# Patient Record
Sex: Male | Born: 1935 | Race: White | Hispanic: No | Marital: Married | Smoking: Former smoker
Health system: Southern US, Community
[De-identification: ages and names within clinical notes are randomized; demographics above are authoritative.]

## PROBLEM LIST (undated history)

## (undated) DIAGNOSIS — F419 Anxiety disorder, unspecified: Secondary | ICD-10-CM

## (undated) DIAGNOSIS — I714 Abdominal aortic aneurysm, without rupture, unspecified: Secondary | ICD-10-CM

## (undated) DIAGNOSIS — I639 Cerebral infarction, unspecified: Secondary | ICD-10-CM

## (undated) DIAGNOSIS — I2510 Atherosclerotic heart disease of native coronary artery without angina pectoris: Secondary | ICD-10-CM

## (undated) DIAGNOSIS — I5020 Unspecified systolic (congestive) heart failure: Secondary | ICD-10-CM

## (undated) DIAGNOSIS — E039 Hypothyroidism, unspecified: Secondary | ICD-10-CM

## (undated) DIAGNOSIS — K219 Gastro-esophageal reflux disease without esophagitis: Secondary | ICD-10-CM

## (undated) DIAGNOSIS — J189 Pneumonia, unspecified organism: Secondary | ICD-10-CM

## (undated) DIAGNOSIS — Z86718 Personal history of other venous thrombosis and embolism: Secondary | ICD-10-CM

## (undated) DIAGNOSIS — M1990 Unspecified osteoarthritis, unspecified site: Secondary | ICD-10-CM

## (undated) DIAGNOSIS — I219 Acute myocardial infarction, unspecified: Secondary | ICD-10-CM

## (undated) DIAGNOSIS — I6529 Occlusion and stenosis of unspecified carotid artery: Secondary | ICD-10-CM

## (undated) DIAGNOSIS — Z95810 Presence of automatic (implantable) cardiac defibrillator: Secondary | ICD-10-CM

## (undated) DIAGNOSIS — I4891 Unspecified atrial fibrillation: Secondary | ICD-10-CM

## (undated) DIAGNOSIS — E785 Hyperlipidemia, unspecified: Secondary | ICD-10-CM

## (undated) DIAGNOSIS — I509 Heart failure, unspecified: Secondary | ICD-10-CM

## (undated) DIAGNOSIS — K635 Polyp of colon: Secondary | ICD-10-CM

## (undated) DIAGNOSIS — I739 Peripheral vascular disease, unspecified: Secondary | ICD-10-CM

## (undated) DIAGNOSIS — I495 Sick sinus syndrome: Secondary | ICD-10-CM

## (undated) DIAGNOSIS — I255 Ischemic cardiomyopathy: Secondary | ICD-10-CM

## (undated) DIAGNOSIS — I10 Essential (primary) hypertension: Secondary | ICD-10-CM

## (undated) HISTORY — DX: Abdominal aortic aneurysm, without rupture, unspecified: I71.40

## (undated) HISTORY — DX: Personal history of other venous thrombosis and embolism: Z86.718

## (undated) HISTORY — DX: Heart failure, unspecified: I50.9

## (undated) HISTORY — PX: INGUINAL HERNIA REPAIR: SUR1180

## (undated) HISTORY — PX: ESOPHAGOGASTRODUODENOSCOPY: SHX1529

## (undated) HISTORY — DX: Pneumonia, unspecified organism: J18.9

## (undated) HISTORY — DX: Polyp of colon: K63.5

## (undated) HISTORY — DX: Occlusion and stenosis of unspecified carotid artery: I65.29

## (undated) HISTORY — DX: Peripheral vascular disease, unspecified: I73.9

## (undated) HISTORY — DX: Atherosclerotic heart disease of native coronary artery without angina pectoris: I25.10

## (undated) HISTORY — DX: Unspecified atrial fibrillation: I48.91

## (undated) HISTORY — DX: Anxiety disorder, unspecified: F41.9

## (undated) HISTORY — DX: Hyperlipidemia, unspecified: E78.5

## (undated) HISTORY — DX: Acute myocardial infarction, unspecified: I21.9

## (undated) HISTORY — PX: RENAL ARTERY STENT: SHX2321

## (undated) HISTORY — DX: Sick sinus syndrome: I49.5

## (undated) HISTORY — DX: Abdominal aortic aneurysm, without rupture: I71.4

## (undated) HISTORY — PX: COLONOSCOPY: SHX174

## (undated) HISTORY — PX: CATARACT EXTRACTION W/ INTRAOCULAR LENS  IMPLANT, BILATERAL: SHX1307

## (undated) HISTORY — PX: TRANSLUMINAL ANGIOPLASTY: SHX274

## (undated) HISTORY — PX: OTHER SURGICAL HISTORY: SHX169

---

## 1993-05-17 HISTORY — PX: ABDOMINAL AORTIC ANEURYSM REPAIR: SUR1152

## 1993-05-17 HISTORY — PX: CORONARY ARTERY BYPASS GRAFT: SHX141

## 1999-04-28 ENCOUNTER — Encounter: Payer: Self-pay | Admitting: Internal Medicine

## 1999-04-28 ENCOUNTER — Encounter: Admission: RE | Admit: 1999-04-28 | Discharge: 1999-04-28 | Payer: Self-pay | Admitting: Internal Medicine

## 2002-03-25 ENCOUNTER — Ambulatory Visit (HOSPITAL_COMMUNITY): Admission: RE | Admit: 2002-03-25 | Discharge: 2002-03-25 | Payer: Self-pay | Admitting: Cardiology

## 2002-05-04 ENCOUNTER — Ambulatory Visit (HOSPITAL_COMMUNITY): Admission: RE | Admit: 2002-05-04 | Discharge: 2002-05-04 | Payer: Self-pay | Admitting: Cardiology

## 2003-06-22 ENCOUNTER — Ambulatory Visit (HOSPITAL_COMMUNITY): Admission: RE | Admit: 2003-06-22 | Discharge: 2003-06-22 | Payer: Self-pay | Admitting: *Deleted

## 2003-06-22 ENCOUNTER — Encounter (INDEPENDENT_AMBULATORY_CARE_PROVIDER_SITE_OTHER): Payer: Self-pay | Admitting: Specialist

## 2004-09-28 ENCOUNTER — Observation Stay (HOSPITAL_COMMUNITY): Admission: RE | Admit: 2004-09-28 | Discharge: 2004-09-28 | Payer: Self-pay | Admitting: Surgery

## 2005-10-12 ENCOUNTER — Encounter (INDEPENDENT_AMBULATORY_CARE_PROVIDER_SITE_OTHER): Payer: Self-pay | Admitting: *Deleted

## 2005-10-12 ENCOUNTER — Ambulatory Visit (HOSPITAL_COMMUNITY): Admission: RE | Admit: 2005-10-12 | Discharge: 2005-10-12 | Payer: Self-pay | Admitting: *Deleted

## 2007-02-05 ENCOUNTER — Inpatient Hospital Stay (HOSPITAL_COMMUNITY): Admission: EM | Admit: 2007-02-05 | Discharge: 2007-02-09 | Payer: Self-pay | Admitting: Emergency Medicine

## 2007-03-04 ENCOUNTER — Encounter: Payer: Self-pay | Admitting: Internal Medicine

## 2008-01-27 ENCOUNTER — Ambulatory Visit (HOSPITAL_COMMUNITY): Admission: RE | Admit: 2008-01-27 | Discharge: 2008-01-27 | Payer: Self-pay | Admitting: *Deleted

## 2008-05-11 ENCOUNTER — Encounter: Admission: RE | Admit: 2008-05-11 | Discharge: 2008-05-11 | Payer: Self-pay | Admitting: Internal Medicine

## 2008-11-17 ENCOUNTER — Encounter: Payer: Self-pay | Admitting: Internal Medicine

## 2009-01-04 ENCOUNTER — Encounter: Payer: Self-pay | Admitting: Internal Medicine

## 2009-06-28 ENCOUNTER — Encounter: Payer: Self-pay | Admitting: Internal Medicine

## 2009-06-30 ENCOUNTER — Ambulatory Visit: Payer: Self-pay | Admitting: Internal Medicine

## 2009-06-30 DIAGNOSIS — E785 Hyperlipidemia, unspecified: Secondary | ICD-10-CM | POA: Insufficient documentation

## 2009-06-30 DIAGNOSIS — I4891 Unspecified atrial fibrillation: Secondary | ICD-10-CM | POA: Insufficient documentation

## 2009-06-30 DIAGNOSIS — I495 Sick sinus syndrome: Secondary | ICD-10-CM | POA: Insufficient documentation

## 2009-06-30 DIAGNOSIS — I2510 Atherosclerotic heart disease of native coronary artery without angina pectoris: Secondary | ICD-10-CM | POA: Insufficient documentation

## 2009-06-30 DIAGNOSIS — I10 Essential (primary) hypertension: Secondary | ICD-10-CM | POA: Insufficient documentation

## 2009-07-07 ENCOUNTER — Ambulatory Visit (HOSPITAL_COMMUNITY): Admission: RE | Admit: 2009-07-07 | Discharge: 2009-07-07 | Payer: Self-pay | Admitting: Internal Medicine

## 2009-07-07 ENCOUNTER — Encounter: Payer: Self-pay | Admitting: Internal Medicine

## 2009-07-07 ENCOUNTER — Ambulatory Visit: Payer: Self-pay

## 2009-07-07 ENCOUNTER — Ambulatory Visit: Payer: Self-pay | Admitting: Internal Medicine

## 2009-07-13 ENCOUNTER — Telehealth: Payer: Self-pay | Admitting: Internal Medicine

## 2009-07-14 ENCOUNTER — Encounter: Payer: Self-pay | Admitting: Internal Medicine

## 2009-07-20 ENCOUNTER — Telehealth: Payer: Self-pay | Admitting: Internal Medicine

## 2009-07-21 ENCOUNTER — Encounter: Payer: Self-pay | Admitting: Internal Medicine

## 2009-07-21 ENCOUNTER — Telehealth: Payer: Self-pay | Admitting: Internal Medicine

## 2009-07-25 ENCOUNTER — Encounter: Payer: Self-pay | Admitting: Internal Medicine

## 2009-07-26 ENCOUNTER — Encounter: Payer: Self-pay | Admitting: Internal Medicine

## 2009-07-28 ENCOUNTER — Telehealth: Payer: Self-pay | Admitting: Internal Medicine

## 2009-07-29 ENCOUNTER — Encounter: Payer: Self-pay | Admitting: Internal Medicine

## 2009-08-01 ENCOUNTER — Ambulatory Visit: Payer: Self-pay | Admitting: Internal Medicine

## 2009-08-01 ENCOUNTER — Inpatient Hospital Stay (HOSPITAL_COMMUNITY): Admission: RE | Admit: 2009-08-01 | Discharge: 2009-08-02 | Payer: Self-pay | Admitting: Internal Medicine

## 2009-08-01 HISTORY — PX: CARDIAC PACEMAKER PLACEMENT: SHX583

## 2009-08-13 ENCOUNTER — Ambulatory Visit: Payer: Self-pay | Admitting: Diagnostic Radiology

## 2009-08-13 ENCOUNTER — Emergency Department (HOSPITAL_BASED_OUTPATIENT_CLINIC_OR_DEPARTMENT_OTHER): Admission: EM | Admit: 2009-08-13 | Discharge: 2009-08-13 | Payer: Self-pay | Admitting: Emergency Medicine

## 2009-08-18 ENCOUNTER — Encounter: Payer: Self-pay | Admitting: Internal Medicine

## 2009-08-18 ENCOUNTER — Ambulatory Visit: Payer: Self-pay

## 2009-08-19 ENCOUNTER — Ambulatory Visit (HOSPITAL_COMMUNITY): Admission: RE | Admit: 2009-08-19 | Discharge: 2009-08-19 | Payer: Self-pay | Admitting: Internal Medicine

## 2009-08-19 ENCOUNTER — Encounter: Payer: Self-pay | Admitting: Internal Medicine

## 2009-08-19 ENCOUNTER — Ambulatory Visit: Payer: Self-pay | Admitting: Thoracic Surgery

## 2009-09-15 ENCOUNTER — Telehealth (INDEPENDENT_AMBULATORY_CARE_PROVIDER_SITE_OTHER): Payer: Self-pay | Admitting: *Deleted

## 2009-11-22 ENCOUNTER — Ambulatory Visit: Payer: Self-pay | Admitting: Internal Medicine

## 2009-11-22 DIAGNOSIS — Z95810 Presence of automatic (implantable) cardiac defibrillator: Secondary | ICD-10-CM | POA: Insufficient documentation

## 2009-12-13 ENCOUNTER — Encounter: Admission: RE | Admit: 2009-12-13 | Discharge: 2009-12-13 | Payer: Self-pay | Admitting: Thoracic Surgery

## 2009-12-13 ENCOUNTER — Ambulatory Visit: Payer: Self-pay | Admitting: Thoracic Surgery

## 2009-12-14 ENCOUNTER — Telehealth: Payer: Self-pay | Admitting: Internal Medicine

## 2009-12-16 ENCOUNTER — Encounter: Payer: Self-pay | Admitting: Cardiovascular Disease

## 2009-12-16 DIAGNOSIS — I6529 Occlusion and stenosis of unspecified carotid artery: Secondary | ICD-10-CM | POA: Insufficient documentation

## 2009-12-19 ENCOUNTER — Encounter: Payer: Self-pay | Admitting: Cardiovascular Disease

## 2009-12-19 ENCOUNTER — Ambulatory Visit: Payer: Self-pay | Admitting: Internal Medicine

## 2009-12-19 ENCOUNTER — Ambulatory Visit: Payer: Self-pay

## 2009-12-21 ENCOUNTER — Encounter: Payer: Self-pay | Admitting: Cardiovascular Disease

## 2010-02-21 ENCOUNTER — Ambulatory Visit: Payer: Self-pay | Admitting: Internal Medicine

## 2010-04-08 ENCOUNTER — Encounter: Payer: Self-pay | Admitting: Cardiology

## 2010-04-20 NOTE — Letter (Signed)
Summary: [facility]   Imported By: Marylou Mccoy 12/19/2009 15:09:45  _____________________________________________________________________  External Attachment:    Type:   Image     Comment:   External Document

## 2010-04-20 NOTE — Progress Notes (Signed)
Summary: echo results  Phone Note Call from Patient Call back at Home Phone [phone]   Caller: Spouse Reason for Call: Lab or Test Results Summary of Call: request echo results Initial call taken by: Migdalia Dk,  July 13, 2009 3:23 PM  Follow-up for Phone Call        pt aware Dennis Bast, RN, BSN  July 13, 2009 3:54 PM

## 2010-04-20 NOTE — Progress Notes (Signed)
Summary: procedure date  Phone Note Call from Patient Call back at Home Phone [phone]   Caller: Patient Reason for Call: Talk to Nurse Summary of Call: pt calling back about procedure date.... pt request 5/16 Initial call taken by: Migdalia Dk,  Jul 21, 2009 11:49 AM  Follow-up for Phone Call        spoke with wife procedure will be on the 16th  and the labs on Mon5/9/11 at Dr Elmyra Ricks office [facility].  pt's wife aware Dennis Bast, RN, BSN  Jul 21, 2009 12:14 PM

## 2010-04-20 NOTE — Progress Notes (Signed)
  Records Recieved from [facility] gave to [facility]  September 15, 2009 2:16 PM

## 2010-04-20 NOTE — Cardiovascular Report (Signed)
Summary: Office Visit   Office Visit   Imported By: Roderic Ovens 09/15/2009 12:44:47  _____________________________________________________________________  External Attachment:    Type:   Image     Comment:   External Document

## 2010-04-20 NOTE — Letter (Signed)
Summary: [facility]   Imported By: Marylou Mccoy 12/19/2009 15:09:09  _____________________________________________________________________  External Attachment:    Type:   Image     Comment:   External Document

## 2010-04-20 NOTE — Procedures (Signed)
Summary: [facility] g[facility]   Current Medications (verified): 1)  Lipitor 20 Mg Tabs (Atorvastatin Calcium) .... Take One Tablet By Mouth Daily. 2)  Calcium 600 1500 Mg Tabs (Calcium Carbonate) .... Once Daily 3)  Benicar Hct 40-12.5 Mg Tabs (Olmesartan Medoxomil-Hctz) .... Take One Tablet By Mouth Once Daily. 4)  Synthroid 125 Mcg Tabs (Levothyroxine Sodium) .... Take One Tablet By Mouth Once Daily. 5)  Super B Complex  Tabs (B Complex-C) .... Once Daily 6)  Fish Oil   Oil (Fish Oil) .... Two Times A Day 7)  Glucosamine Sulfate   Powd (Glucosamine Sulfate) .Marland Kitchen.. 1-2 Daily 8)  Omeprazole 20 Mg Cpdr (Omeprazole) .... As Needed 9)  Vitamin C 500 Mg  Tabs (Ascorbic Acid) .... Once Daily 10)  Saw Palmetto   Powd (Saw [place]) .... Once Daily 11)  Multivitamins   Tabs (Multiple Vitamin) .... Qd 12)  Vitamin D 1000 Unit  Tabs (Cholecalciferol) .... Once Daily 13)  Gnp Cinnamon .... 2 Daily 14)  Magnesium Oxide 500 Mg Tabs (Magnesium Oxide) .... Once Daily 15)  Jantoven 4 Mg Tabs (Warfarin Sodium) .... Uad 16)  Diltiazem Hcl Cr 120 Mg Xr12h-Cap (Diltiazem Hcl) .... One By Mouth Daily  Allergies (verified): 1)  ! Morphine 2)  ! Codeine  PPM Specifications Following MD:  Lewayne Bunting, MD     PPM Vendor:  St Jude     PPM Model Number:  [id number]     PPM Serial Number:  [id number] PPM DOI:  08/01/2009     PPM Implanting MD:  Lewayne Bunting, MD  Lead 1    Location: RA     DOI: 08/01/2009     Model #: [id number]     Serial #: [id number]     Status: active Lead 2    Location: RV     DOI: 08/01/2009     Model #: [id number]     Serial #: [id number]     Status: active  Magnet Response Rate:  BOL 100 ERI  85  Indications:  Sick sinus syndrome   PPM Follow Up Remote Check?  No Battery Voltage:  3.11 V     Battery Est. Longevity:  6.4 years     Pacer Dependent:  No       PPM Device Measurements Atrium  Amplitude: 2.2 mV, Impedance: 530 ohms,  Right Ventricle  Amplitude: 12 mV, Impedance: 530 ohms, Threshold:  0.75 V at 0.4 msec  Episodes MS Episodes:  93     Percent Mode Switch:  41%     Coumadin:  Yes Ventricular High Rate:  5     Atrial Pacing:  37%     Ventricular Pacing:  3.9%  Parameters Mode:  DDDR     Lower Rate Limit:  60     Upper Rate Limit:  105 Paced AV Delay:  250     Sensed AV Delay:  250 Tech Comments:  Steri strips removed.  No redness or edema. A-fib with RVR, + coumadin.  135/61.  Cardizem decreased by PCP 120mg  once daily.  Mr. Pettengill c/o fatigue and SOB.  I will discuss this with Dr. Ladona Ridgel.  He is scheduled for follow up in September. Altha Harm, LPN  August 18, 5364 3:49 PM   Prescriptions: DIGOXIN 0.125 MG TABS (DIGOXIN) take 1 tablet qd  #90 x 3   Entered by:   Ledon Snare, RN   Authorized by:   Laren Boom, MD, [facility]   Signed by:  Ledon Snare, RN on 08/19/2009   Method used:   Electronically to        [facility] (retail)       [phone]       [place], [place]  [id number]       Ph: [phone]       Fax: [phone]   RxID:   [id number]

## 2010-04-20 NOTE — Miscellaneous (Signed)
Summary: Orders Update  Clinical Lists Changes  Problems: Added new problem of CAROTID ARTERY DISEASE (ICD-433.10) - Per IDX comments, Dr. Pearson Grippe Orders: Added new Test order of Carotid Duplex (Carotid Duplex) - Signed

## 2010-04-20 NOTE — Letter (Signed)
Summary: Implantable Device Instructions  Architectural technologist, Main Office  [address]   [place], [place] [address]   Phone: [phone]  Fax: [phone]      Implantable Device Instructions  You are scheduled for:  _____ Permanent Transvenous Pacemaker   on 08/01/09 with Dr. Ladona Ridgel.  1.  Please arrive at the Short Stay Center at [facility] at 5:30a.m. on the day of your procedure.  2.  Do not eat or drink after midnight the night before your procedure.  3.  Complete lab work on 5-9/11 at Dr Elmyra Ricks office.  You do not have to be fasting.  4.   Take your last dose of Coumadin on --will call you if you need to hold your Coumadin.  5.  Plan for an overnight stay.  Bring your insurance cards and a list of your medications.  6.  Wash your chest and neck with antibacterial soap (any brand) the evening before and the morning of your procedure.  Rinse well.   *If you have ANY questions after you get home, please call the office [phone].  Anselm Pancoast  *Every attempt is made to prevent procedures from being rescheduled.  Due to the nauture of Electrophysiology, rescheduling can happen.  The physician is always aware and directs the staff when this occurs.

## 2010-04-20 NOTE — Medication Information (Signed)
Summary: Physician's Orders   Physician's Orders   Imported By: Roderic Ovens 09/16/2009 10:19:49  _____________________________________________________________________  External Attachment:    Type:   Image     Comment:   External Document

## 2010-04-20 NOTE — Progress Notes (Signed)
Summary: medication making him dizzy  Phone Note Call from Patient Call back at Home Phone [phone] Call back at [phone]   Caller: Patient Reason for Call: Talk to Nurse, Talk to Doctor Summary of Call: pt  can't take amiodarone 200mg  because it makes him dizzy so he needs to talk to someone cause he feels like he will pass out Initial call taken by: Omer Jack,  December 14, 2009 12:40 PM  Follow-up for Phone Call        2 weeks ago decreased the Amiodarone to 1/2 tablet daily and as of Monday stopped Amiodarone all together.  Will go back on Diitiazem 120mg  daily.  Dr Ladona Ridgel aware and agrees with the above plan.  Pt coming in on Mon will have his device interrogated then Dennis Bast, RN, BSN  December 14, 2009 2:04 PM    New/Updated Medications: DILTIAZEM HCL ER BEADS 120 MG XR24H-CAP (DILTIAZEM HCL ER BEADS) Take one capsule by mouth daily

## 2010-04-20 NOTE — Cardiovascular Report (Signed)
Summary: Implantable Device Registration Form  Implantable Device Registration Form   Imported By: Roderic Ovens 09/15/2009 12:45:41  _____________________________________________________________________  External Attachment:    Type:   Image     Comment:   External Document

## 2010-04-20 NOTE — Assessment & Plan Note (Signed)
Summary: device/saf   Visit Type:  Follow-up Primary Larell Baney:  Pearson Grippe, MD   History of Present Illness: Mr. Milke returns today for followup.  He is a pleasant 77 yo man with CAD and preserved LV function who has developed symptomatic bradycardia and PAF.  He underwent PPM several months ago.  He has not had syncope  but admits to symptomatic palpitations.  Current Medications (verified): 1)  Lipitor 20 Mg Tabs (Atorvastatin Calcium) .... Take One Tablet By Mouth Daily. 2)  Calcium 600 1500 Mg Tabs (Calcium Carbonate) .... 3 Times Per Week 3)  Benicar Hct 40-12.5 Mg Tabs (Olmesartan Medoxomil-Hctz) .... Take One Tablet By Mouth Once Daily. 4)  Synthroid 100 Mcg Tabs (Levothyroxine Sodium) .... Once Daily 5)  Super B Complex  Tabs (B Complex-C) .... Once Daily 6)  Fish Oil   Oil (Fish Oil) .... Once Daily 7)  Omeprazole 20 Mg Cpdr (Omeprazole) .... As Needed 8)  Vitamin C 500 Mg  Tabs (Ascorbic Acid) .... Once Daily 9)  Saw Palmetto   Powd (Saw [place]) .... Two Times A Day 10)  Multivitamins   Tabs (Multiple Vitamin) .... Qd 11)  Vitamin D 1000 Unit  Tabs (Cholecalciferol) .... Once Daily 12)  Gnp Cinnamon .... 2 Daily 13)  Magnesium Oxide 500 Mg Tabs (Magnesium Oxide) .... Once Daily 14)  Jantoven 4 Mg Tabs (Warfarin Sodium) .... Uad 15)  Diltiazem Hcl Er Beads 120 Mg Xr24h-Cap (Diltiazem Hcl Er Beads) .... Take One Capsule By Mouth Daily  Allergies: 1)  ! Morphine 2)  ! Codeine  Past History:  Past Medical History: Last updated: 06/30/2009 Current Problems:  CAD (ICD-414.00) OTHER AND UNSPECIFIED HYPERLIPIDEMIA (ICD-272.4) HYPERTENSION (ICD-401.9) ATRIAL FIBRILLATION, PAROXYSMAL (ICD-427.31) BRADYCARDIA-TACHYCARDIA SYNDROME (ICD-427.81)    Past Surgical History: Last updated: 06/30/2009  DATE OF PROCEDURE:  09/28/2004  SURGEON:  Velora Heckler, M.D.  PROCEDURE:  Repair left inguinal hernia with polypropylene mesh.  Review of Systems  The patient denies chest  pain, syncope, dyspnea on exertion, and peripheral edema.    Vital Signs:  Patient profile:   77 year old male Height:      73 inches Weight:      210 pounds Pulse rate:   60 / minute Pulse rhythm:   regular BP sitting:   110 / 60  (left arm)  Vitals Entered By: Laurance Flatten CMA (February 21, 2010 10:49 AM)  Physical Exam  General:  Elderly, well developed, well nourished, in no acute distress.  HEENT: normal Neck: supple. No JVD. Carotids 2+ bilaterally no bruits Cor: RRR no rubs, gallops or murmur Lungs: CTA.Decreased breath sounds throughout. Ab: soft, nontender. nondistended. No HSM. Good bowel sounds. Well healed midline scar. Ext: warm. no cyanosis, clubbing or edema Neuro: alert and oriented. Grossly nonfocal. affect pleasant    PPM Specifications Following MD:  Lewayne Bunting, MD     PPM Vendor:  St Jude     PPM Model Number:  [id number]     PPM Serial Number:  [id number] PPM DOI:  08/01/2009     PPM Implanting MD:  Lewayne Bunting, MD  Lead 1    Location: RA     DOI: 08/01/2009     Model #: [id number]     Serial #: [id number]     Status: active Lead 2    Location: RV     DOI: 08/01/2009     Model #: [id number]     Serial #: [id number]     Status: active  Magnet Response Rate:  BOL 100 ERI  85  Indications:  Sick sinus syndrome   PPM Follow Up Battery Voltage:  2.96 V     Battery Est. Longevity:  6.5-6.8 YRS     Pacer Dependent:  No       PPM Device Measurements Atrium  Amplitude: 3.3 mV, Impedance: 410 ohms, Threshold: 1.75 V at 0.4 msec Right Ventricle  Amplitude: 12.0 mV, Impedance: 530 ohms,   Episodes MS Episodes:  3085     Percent Mode Switch:  <1%     Coumadin:  Yes Ventricular High Rate:  0     Atrial Pacing:  64%     Ventricular Pacing:  16%  Parameters Mode:  DDDR     Lower Rate Limit:  60     Upper Rate Limit:  105 Paced AV Delay:  250     Sensed AV Delay:  250 Next Cardiology Appt Due:  07/18/2010 Tech Comments:  3085 AMS EPISODES--LONGEST WAS 11 HRS 15 MINUTES. +  COUMADIN.  NORMAL DEVICE FUNCTION.  NO CHANGES MADE. ROV IN MAY 2012 W/GT. Vella Kohler  February 21, 2010 11:04 AM MD Comments:  Agree with above.  Impression & Recommendations:  Problem # 1:  PACEMAKER, PERMANENT (ICD-V45.01) His device is working normally.  Will recheck in several months.  Problem # 2:  CAD (ICD-414.00) He denies anginal symptoms.  Continue meds as below. His updated medication list for this problem includes:    Jantoven 4 Mg Tabs (Warfarin sodium) ..... Uad    Diltiazem Hcl Er Beads 120 Mg Xr24h-cap (Diltiazem hcl er beads) .Marland Kitchen... Take one capsule by mouth daily    Nitrostat 0.4 Mg Subl (Nitroglycerin) .Marland Kitchen... Take as directed  Problem # 3:  ATRIAL FIBRILLATION, PAROXYSMAL (ICD-427.31) His symptoms are well controlled. Continue meds as below. The following medications were removed from the medication list:    Amiodarone Hcl 200 Mg Tabs (Amiodarone hcl) .Marland Kitchen... Take one tablet by mouth daily His updated medication list for this problem includes:    Jantoven 4 Mg Tabs (Warfarin sodium) ..... Uad  Patient Instructions: 1)  Your physician wants you to follow-up in: May with Dr Court Joy will receive a reminder letter in the mail two months in advance. If you don't receive a letter, please call our office to schedule the follow-up appointment. 2)  Your physician recommended you take 1 tablet (or 1 spray) under tongue at onset of chest pain; you may repeat every 5 minutes for up to 3 doses. If 3 or more doses are required, call 911 and proceed to the ER immediately. Prescriptions: NITROSTAT 0.4 MG SUBL (NITROGLYCERIN) take as directed  #25 x 11   Entered by:   Dennis Bast, RN, BSN   Authorized by:   Laren Boom, MD, [facility]   Signed by:   Dennis Bast, RN, BSN on 02/21/2010   Method used:   Electronically to        [facility] (retail)       [address]       [place], [place]  [id number]       Ph: [phone]        Fax: [phone]   RxID:   [id number]

## 2010-04-20 NOTE — Progress Notes (Signed)
Summary: question regarding procedure  Phone Note Call from Patient Call back at Home Phone [phone] Call back at [phone]   Caller: Spouse- Trevor Simpson Reason for Call: Talk to Nurse Summary of Call: pt wife calling having question regareding procedure, also did you received the faxed from yesterday.  Initial call taken by: Lorne Skeens,  Jul 28, 2009 2:01 PM  Follow-up for Phone Call        the cell number has no voicemail spoke with wife all of her questions were answered Dennis Bast, RN, BSN  Jul 28, 2009 5:08 PM

## 2010-04-20 NOTE — Progress Notes (Signed)
Summary: device placement  Phone Note Call from Patient Call back at Home Phone [phone]   Caller: Spouse Reason for Call: Talk to Nurse Summary of Call: calling back has not heard anything about deivce placement Initial call taken by: Migdalia Dk,  Jul 20, 2009 2:42 PM  Follow-up for Phone Call        07/20/09--3pm--pt calling about device implant, what day is it sched for?  called the # listed on message but no answer and no answering machine--nt 07/20/09--3:30pm--pt calling back to find out day of procedure--advised-kelly out of office today but will be back  on 5/5--your paperwork is all filled out and kelly will be calling you in next 2 days to sched. the proccedure on day convenient for you--pt agrees--nt Follow-up by: Ledon Snare, RN,  Jul 20, 2009 3:42 PM

## 2010-04-20 NOTE — Cardiovascular Report (Signed)
Summary: Office Visit   Office Visit   Imported By: Roderic Ovens 12/26/2009 11:34:21  _____________________________________________________________________  External Attachment:    Type:   Image     Comment:   External Document

## 2010-04-20 NOTE — Cardiovascular Report (Signed)
Summary: Office Visit   Office Visit   Imported By: Roderic Ovens 03/02/2010 11:57:48  _____________________________________________________________________  External Attachment:    Type:   Image     Comment:   External Document

## 2010-04-20 NOTE — Assessment & Plan Note (Signed)
Summary: NEP/ APPT 1:30/ [facility]/ DISCUSS OF SYNDROME/ AARP GD   Visit Type:  Initial Consult Primary Provider:  Pearson Grippe, MD   History of Present Illness: Trevor Simpson is referred today by Dr. Aleen Campi.  He is a pleasant 77 yo man with CAD and preserved LV function who has developed symptomatic bradycardia.  He was seen in the office of [facility] and found to be bradycardia into the low 40's.  He has never had syncope but does feel fatigued and light headed when his heart goes slowly.  At other times he is asymptomatic.  At still other times, he has palpitations and has had atrial fib with an RVR.  He currently feels well.  No peripheral edema but he does have fairly severe peripheral vascular disease and has had an aortic aneurysm repair in the past.  Current Medications (verified): 1)  Lipitor 20 Mg Tabs (Atorvastatin Calcium) .... Take One Tablet By Mouth Daily. 2)  Calcium 600 1500 Mg Tabs (Calcium Carbonate) .... Once Daily 3)  Benicar Hct 40-12.5 Mg Tabs (Olmesartan Medoxomil-Hctz) .... Take One Tablet By Mouth Once Daily. 4)  Synthroid 137 Mcg Tabs (Levothyroxine Sodium) .... Take One Tablet By Mouth Once Daily. 5)  Diltiazem Hcl Er Beads 240 Mg Xr24h-Cap (Diltiazem Hcl Er Beads) .... Take One Capsule By Mouth Daily 6)  Super B Complex  Tabs (B Complex-C) .... Once Daily 7)  Fish Oil   Oil (Fish Oil) .... Two Times A Day 8)  Glucosamine Sulfate   Powd (Glucosamine Sulfate) .Marland Kitchen.. 1-2 Daily 9)  Omeprazole 20 Mg Cpdr (Omeprazole) .... As Needed 10)  Vitamin C 500 Mg  Tabs (Ascorbic Acid) .... Once Daily 11)  Saw Palmetto   Powd (Saw [place]) .... Once Daily 12)  Multivitamins   Tabs (Multiple Vitamin) .... Qd 13)  Vitamin D 1000 Unit  Tabs (Cholecalciferol) .... Once Daily 14)  Gnp Cinnamon .... 2 Daily 15)  Magnesium Oxide 500 Mg Tabs (Magnesium Oxide) .... Once Daily 16)  Jantoven 4 Mg Tabs (Warfarin Sodium) .... Uad  Allergies (verified): 1)  ! Morphine 2)   ! Codeine  Past History:  Past Medical History: Last updated: 06/30/2009 Current Problems:  CAD (ICD-414.00) OTHER AND UNSPECIFIED HYPERLIPIDEMIA (ICD-272.4) HYPERTENSION (ICD-401.9) ATRIAL FIBRILLATION, PAROXYSMAL (ICD-427.31) BRADYCARDIA-TACHYCARDIA SYNDROME (ICD-427.81)    Past Surgical History: Last updated: 06/30/2009  DATE OF PROCEDURE:  09/28/2004  SURGEON:  Velora Heckler, M.D.  PROCEDURE:  Repair left inguinal hernia with polypropylene mesh.  Family History: Non-contributory  Social History: H/o tobacco abuse, none in years. No ETOH abuse. Married.  Review of Systems       All systems reviewed and negative except as noted in the HPI.  Vital Signs:  Patient profile:   77 year old male Height:      73 inches Weight:      218 pounds BMI:     28.87 Pulse rate:   55 / minute BP sitting:   110 / 80  (left arm)  Vitals Entered By: Laurance Flatten CMA (June 30, 2009 1:53 PM)  Physical Exam  General:  Elderly, well developed, well nourished, in no acute distress.  HEENT: normal Neck: supple. No JVD. Carotids 2+ bilaterally no bruits Cor: RRR no rubs, gallops or murmur Lungs: CTA.Decreased breath sounds throughout. Ab: soft, nontender. nondistended. No HSM. Good bowel sounds. Well healed midline scar. Ext: warm. no cyanosis, clubbing or edema Neuro: alert and oriented. Grossly nonfocal. affect pleasant    EKG  Procedure  date:  06/30/2009  Findings:      Normal sinus rhythm with rate of: 55.   Impression & Recommendations:  Problem # 1:  BRADYCARDIA-TACHYCARDIA SYNDROME (ICD-427.81) He is symptomatic and I have discussed the risks/benefits/goals/expectations of PPM and he wishes to proceed. His updated medication list for this problem includes:    Diltiazem Hcl Er Beads 240 Mg Xr24h-cap (Diltiazem hcl er beads) .Marland Kitchen... Take one capsule by mouth daily    Jantoven 4 Mg Tabs (Warfarin sodium) ..... Uad  Orders: Echocardiogram (Echo)  Problem # 2:  CAD  (ICD-414.00) He feels well and is asymptomatic.  However, he has gone 3 yrs since a 2D echo and has significant CAD by cath 8 yrs ago.  I have asked him to repeat a 2D echo as asymptomatic LV dysfunction might result in additional workup. His updated medication list for this problem includes:    Diltiazem Hcl Er Beads 240 Mg Xr24h-cap (Diltiazem hcl er beads) .Marland Kitchen... Take one capsule by mouth daily    Jantoven 4 Mg Tabs (Warfarin sodium) ..... Uad  Orders: Echocardiogram (Echo)  Problem # 3:  ATRIAL FIBRILLATION, PAROXYSMAL (ICD-427.31) He is on coumadin with out additional problem.  Once his PPM is placed, I will plan to uptitrate his beta blocker therapy. His updated medication list for this problem includes:    Jantoven 4 Mg Tabs (Warfarin sodium) ..... Uad  Orders: Echocardiogram (Echo)  Patient Instructions: 1)  Your physician has requested that you have an echocardiogram.  Echocardiography is a painless test that uses sound waves to create images of your heart. It provides your doctor with information about the size and shape of your heart and how well your heart's chambers and valves are working.  This procedure takes approximately one hour. There are no restrictions for this procedure. 2)  We will call you after echocardiogram done to schedule pacemaker.

## 2010-04-20 NOTE — Assessment & Plan Note (Signed)
Summary: pc2/ gd   Visit Type:  Follow-up Primary Provider:  Pearson Grippe, MD   History of Present Illness: Trevor Simpson returns today for followup.  He is a pleasant 77 yo man with CAD and preserved LV function who has developed symptomatic bradycardia and PAF.  He underwent PPM several months ago.  He has not had syncope  but admits to symptomatic palpitations.  Current Medications (verified): 1)  Lipitor 20 Mg Tabs (Atorvastatin Calcium) .... Take One Tablet By Mouth Daily. 2)  Calcium 600 1500 Mg Tabs (Calcium Carbonate) .... 3 Times Per Week 3)  Benicar Hct 40-12.5 Mg Tabs (Olmesartan Medoxomil-Hctz) .... Take One Tablet By Mouth Once Daily. 4)  Synthroid 100 Mcg Tabs (Levothyroxine Sodium) .... Once Daily 5)  Super B Complex  Tabs (B Complex-C) .... Once Daily 6)  Fish Oil   Oil (Fish Oil) .... Once Daily 7)  Glucosamine Sulfate   Powd (Glucosamine Sulfate) .Marland Kitchen.. 1-2 Daily 8)  Omeprazole 20 Mg Cpdr (Omeprazole) .... As Needed 9)  Vitamin C 500 Mg  Tabs (Ascorbic Acid) .... Once Daily 10)  Saw Palmetto   Powd (Saw [place]) .... Two Times A Day 11)  Multivitamins   Tabs (Multiple Vitamin) .... Qd 12)  Vitamin D 1000 Unit  Tabs (Cholecalciferol) .... Once Daily 13)  Gnp Cinnamon .... 2 Daily 14)  Magnesium Oxide 500 Mg Tabs (Magnesium Oxide) .... Once Daily 15)  Jantoven 4 Mg Tabs (Warfarin Sodium) .... Uad 16)  Diltiazem Hcl Cr 120 Mg Xr12h-Cap (Diltiazem Hcl) .... One By Mouth Daily  Allergies (verified): 1)  ! Morphine 2)  ! Codeine  Past History:  Past Medical History: Last updated: 06/30/2009 Current Problems:  CAD (ICD-414.00) OTHER AND UNSPECIFIED HYPERLIPIDEMIA (ICD-272.4) HYPERTENSION (ICD-401.9) ATRIAL FIBRILLATION, PAROXYSMAL (ICD-427.31) BRADYCARDIA-TACHYCARDIA SYNDROME (ICD-427.81)    Past Surgical History: Last updated: 06/30/2009  DATE OF PROCEDURE:  09/28/2004  SURGEON:  Velora Heckler, M.D.  PROCEDURE:  Repair left inguinal hernia with polypropylene  mesh.  Review of Systems  The patient denies chest pain, syncope, dyspnea on exertion, and peripheral edema.    Vital Signs:  Patient profile:   77 year old male Height:      73 inches Weight:      210 pounds BMI:     27.81 Pulse rate:   60 / minute BP sitting:   120 / 62  (left arm)  Vitals Entered By: Laurance Flatten CMA (November 22, 2009 2:25 PM)  Physical Exam  General:  Elderly, well developed, well nourished, in no acute distress.  HEENT: normal Neck: supple. No JVD. Carotids 2+ bilaterally no bruits Cor: RRR no rubs, gallops or murmur Lungs: CTA.Decreased breath sounds throughout. Ab: soft, nontender. nondistended. No HSM. Good bowel sounds. Well healed midline scar. Ext: warm. no cyanosis, clubbing or edema Neuro: alert and oriented. Grossly nonfocal. affect pleasant    PPM Specifications Following MD:  Lewayne Bunting, MD     PPM Vendor:  St Jude     PPM Model Number:  [id number]     PPM Serial Number:  [id number] PPM DOI:  08/01/2009     PPM Implanting MD:  Lewayne Bunting, MD  Lead 1    Location: RA     DOI: 08/01/2009     Model #: [id number]     Serial #: [id number]     Status: active Lead 2    Location: RV     DOI: 08/01/2009     Model #: [id number]  Serial #: [id number]     Status: active  Magnet Response Rate:  BOL 100 ERI  85  Indications:  Sick sinus syndrome   PPM Follow Up Remote Check?  No Battery Voltage:  2.99 V     Battery Est. Longevity:  10.8 years     Pacer Dependent:  No       PPM Device Measurements Atrium  Amplitude: 2.4 mV, Impedance: 540 ohms, Threshold: 1.0 V at 0.4 msec Right Ventricle  Amplitude: 12 mV, Impedance: 530 ohms, Threshold: 0.5 V at 0.4 msec  Episodes MS Episodes:  406     Percent Mode Switch:  27%     Coumadin:  Yes  Parameters Mode:  DDDR     Lower Rate Limit:  60     Upper Rate Limit:  105 Paced AV Delay:  250     Sensed AV Delay:  250 Next Cardiology Appt Due:  07/18/2010 Tech Comments:  Outputs reprogrammed for chronic thresholds.   Ventricular autocapture on.  406 mode switch episodes the longet 5 days, with episodes of RVR, +coumadin.  ROV 5/12 with Dr. Ladona Ridgel. Altha Harm, LPN  November 22, 2009 2:45 PM  MD Comments:  Agree with above.  Impression & Recommendations:  Problem # 1:  ATRIAL FIBRILLATION, PAROXYSMAL (ICD-427.31) He is not well controlled.  I have asked him to switch to amiodarone and stop cardizem.  Will recheck inseveral months. The following medications were removed from the medication list:    Digoxin 0.125 Mg Tabs (Digoxin) .Marland Kitchen... Take 1 tablet qd His updated medication list for this problem includes:    Jantoven 4 Mg Tabs (Warfarin sodium) ..... Uad    Amiodarone Hcl 200 Mg Tabs (Amiodarone hcl) .Marland Kitchen... Take one tablet by mouth daily  Problem # 2:  PACEMAKER, PERMANENT (ICD-V45.01) His device is working normally.  Will recheck in several months.  Problem # 3:  HYPERTENSION (ICD-401.9) His blood pressure has been well controllled.  Continue meds as below. The following medications were removed from the medication list:    Diltiazem Hcl Cr 120 Mg Xr12h-cap (Diltiazem hcl) ..... One by mouth daily His updated medication list for this problem includes:    Benicar Hct 40-12.5 Mg Tabs (Olmesartan medoxomil-hctz) .Marland Kitchen... Take one tablet by mouth once daily.  Problem # 4:  CAD (ICD-414.00) He denies anginal symptoms.  Will follow. The following medications were removed from the medication list:    Diltiazem Hcl Cr 120 Mg Xr12h-cap (Diltiazem hcl) ..... One by mouth daily His updated medication list for this problem includes:    Jantoven 4 Mg Tabs (Warfarin sodium) ..... Uad  Patient Instructions: 1)  Your physician recommends that you schedule a follow-up appointment in: 3 months. 2)  Your physician has recommended you make the following change in your medication: Stop Diltiazem.  3)  Start amiodarone 200mg  daily. Prescriptions: AMIODARONE HCL 200 MG TABS (AMIODARONE HCL) Take one tablet by mouth  daily  #30 x 6   Entered by:   Dennis Bast, RN, BSN   Authorized by:   Laren Boom, MD, [facility]   Signed by:   Dennis Bast, RN, BSN on 11/22/2009   Method used:   Electronically to        [facility] (retail)       [phone]       [place], [place]  [id number]       Ph: [phone]  Fax: [phone]   RxID:   [id number]

## 2010-04-20 NOTE — Miscellaneous (Signed)
Summary: Device preload  Clinical Lists Changes  Observations: Added new observation of PPM INDICATN: Sick sinus syndrome (08/01/2009 11:59) Added new observation of MAGNET RTE: BOL 100 ERI  85 (08/01/2009 11:59) Added new observation of PPMLEADSTAT2: active (08/01/2009 11:59) Added new observation of PPMLEADSER2: [id number] (08/01/2009 11:59) Added new observation of PPMLEADMOD2: 2088TC (08/01/2009 11:59) Added new observation of PPMLEADLOC2: RV (08/01/2009 11:59) Added new observation of PPMLEADSTAT1: active (08/01/2009 11:59) Added new observation of PPMLEADSER1: [id number] (08/01/2009 11:59) Added new observation of PPMLEADMOD1: 2088TC (08/01/2009 11:59) Added new observation of PPMLEADLOC1: RA (08/01/2009 11:59) Added new observation of PPM IMP MD: Lewayne Bunting, MD (08/01/2009 11:59) Added new observation of PPMLEADDOI2: 08/01/2009 (08/01/2009 11:59) Added new observation of PPMLEADDOI1: 08/01/2009 (08/01/2009 11:59) Added new observation of PPM DOI: 08/01/2009 (08/01/2009 11:59) Added new observation of PPM SERL#: [id number]  (08/01/2009 11:59) Added new observation of PPM MODL#: [id number]  (08/01/2009 78:46) Added new observation of PACEMAKERMFG: St Jude  (08/01/2009 11:59) Added new observation of PACEMAKER MD: Lewayne Bunting, MD  (08/01/2009 11:59)      PPM Specifications Following MD:  Lewayne Bunting, MD     PPM Vendor:  [employer] Jude     PPM Model Number:  [id number]     PPM Serial Number:  [id number] PPM DOI:  08/01/2009     PPM Implanting MD:  Lewayne Bunting, MD  Lead 1    Location: RA     DOI: 08/01/2009     Model #: [id number]     Serial #: [id number]     Status: active Lead 2    Location: RV     DOI: 08/01/2009     Model #: [id number]     Serial #: [id number]     Status: active  Magnet Response Rate:  BOL 100 ERI  85  Indications:  Sick sinus syndrome

## 2010-04-20 NOTE — Letter (Signed)
Summary: [facility]  [facility]   Imported By: Marylou Mccoy 12/19/2009 15:07:46  _____________________________________________________________________  External Attachment:    Type:   Image     Comment:   External Document

## 2010-04-20 NOTE — Op Note (Signed)
Summary: [facility]  [facility]   Imported By: Debby Freiberg 09/10/2009 11:17:07  _____________________________________________________________________  External Attachment:    Type:   Image     Comment:   External Document

## 2010-04-20 NOTE — Cardiovascular Report (Signed)
Summary: Pre Op Orders   Pre Op Orders   Imported By: Roderic Ovens 08/04/2009 11:44:38  _____________________________________________________________________  External Attachment:    Type:   Image     Comment:   External Document

## 2010-04-21 NOTE — Letter (Signed)
Summary: [facility] - Carotid Duplex  [facility] - Carotid Duplex   Imported By: Marylou Mccoy 09/05/2009 09:12:49  _____________________________________________________________________  External Attachment:    Type:   Image     Comment:   External Document

## 2010-06-05 LAB — BASIC METABOLIC PANEL
BUN: 19 mg/dL (ref 6–23)
CO2: 29 mEq/L (ref 19–32)
Calcium: 9 mg/dL (ref 8.4–10.5)
Calcium: 9.1 mg/dL (ref 8.4–10.5)
Chloride: 102 mEq/L (ref 96–112)
GFR calc Af Amer: >60 mL/min (ref >60)
GFR calc non Af Amer: 59 mL/min — ABNORMAL LOW (ref >60)
Glucose, Bld: 117 mg/dL — ABNORMAL HIGH (ref 70–99)
Potassium: 4.6 mEq/L (ref 3.5–5.1)

## 2010-06-05 LAB — CBC
MCHC: 34.5 g/dL (ref 30.0–36.0)
MCHC: 33.5 g/dL (ref 30.0–36.0)
MCV: 91.3 fL (ref 78.0–100.0)
MCV: 91.4 fL (ref 78.0–100.0)
Platelets: 148 10*3/uL — ABNORMAL LOW (ref 150–400)
RBC: 4.97 MIL/uL (ref 4.22–5.81)
RDW: 13.7 % (ref 11.5–15.5)
RDW: 13.1 % (ref 11.5–15.5)
WBC: 7.6 10*3/uL (ref 4.0–10.5)

## 2010-06-05 LAB — PROTIME-INR
INR: 1.95 — ABNORMAL HIGH (ref 0.00–1.49)
INR: 1.99 — ABNORMAL HIGH (ref 0.00–1.49)
Prothrombin Time: 22.1 seconds — ABNORMAL HIGH (ref 11.6–15.2)

## 2010-06-05 LAB — GLUCOSE, CAPILLARY
Glucose-Capillary: 111 mg/dL — ABNORMAL HIGH (ref 70–99)
Glucose-Capillary: 131 mg/dL — ABNORMAL HIGH (ref 70–99)
Glucose-Capillary: 171 mg/dL — ABNORMAL HIGH (ref 70–99)

## 2010-06-05 LAB — DIFFERENTIAL
Basophils Relative: 1 % (ref 0–1)
Eosinophils Absolute: 0.1 10*3/uL (ref 0.0–0.7)
Lymphs Abs: 1.9 10*3/uL (ref 0.7–4.0)
Monocytes Relative: 10 % (ref 3–12)
Neutro Abs: 4.8 10*3/uL (ref 1.7–7.7)

## 2010-06-05 LAB — POCT CARDIAC MARKERS
CKMB, poc: 1.4 ng/mL (ref 1.0–8.0)
Myoglobin, poc: 86.2 ng/mL (ref 12–200)
Myoglobin, poc: 93.1 ng/mL (ref 12–200)

## 2010-06-05 LAB — SURGICAL PCR SCREEN: Staphylococcus aureus: POSITIVE — AB

## 2010-06-05 LAB — APTT: aPTT: 37 seconds (ref 24–37)

## 2010-07-20 ENCOUNTER — Other Ambulatory Visit: Payer: Self-pay | Admitting: Thoracic Surgery

## 2010-07-20 DIAGNOSIS — C3490 Malignant neoplasm of unspecified part of unspecified bronchus or lung: Secondary | ICD-10-CM

## 2010-08-01 ENCOUNTER — Encounter: Payer: Self-pay | Admitting: *Deleted

## 2010-08-01 NOTE — Discharge Summary (Signed)
NAME:  Trevor Simpson, Trevor Simpson NO.:  [id number]   MEDICAL RECORD NO.:  [id number]          PATIENT TYPE:  INP   LOCATION:  1420                         FACILITY:  [facility]   PHYSICIAN:  Massie Maroon, MD        DATE OF BIRTH:  09-Jul-1933   DATE OF ADMISSION:  02/05/2007  DATE OF DISCHARGE:  02/09/2007                               DISCHARGE SUMMARY   PRIMARY CARE PHYSICIAN:  Massie Maroon, MD   CARDIOLOGIST:  Antionette Char, MD   DISCHARGE DIAGNOSIS:  New onset atrial fibrillation.   ALLERGIES:  CODEINE, MORPHINE SULFATE.   DISCHARGE MEDICATIONS:  1. Lovenox 100 mg subcutaneous b.i.d.  2. Coumadin 10 mg p.o. q.p.m.  3. Cardizem CD 240 mg daily.  4. Benicar 20 mg p.o. daily.  5. Lipitor 20 mg q.h.s.  6. Synthroid 150 mcg daily.  7. (The patient was previously on aspirin and this will be      discontinued).   CHIEF COMPLAINT:  Palpations and weakness.   HISTORY OF PRESENT ILLNESS:  A 77 year old male with a history of single  vessel coronary artery disease with total occlusion of his proximal  right coronary artery and moderate 50% stenosis in his left anterior  descending requiring catheterization in 2004, presented to the emergency  room after onset of weakness and palpitations.  The patient has had a  similar history for about 4 to 5 years and in 2004 had a prolonged  episode, which required cardioversion.  The patient notes that he has  had occasional monthly episode lasting from several hours to a maximum  of two days where he has some weakness and palpitations.  He apparently  was having an endoscopy when the episode occurred this past time.  The  patient notes that he forgot to take his Cardizem CD 240 mg that day.  This has essentially controlled his episodes.  In the emergency room  telemetry showed atrial fibrillation with a rapid ventricular response  of 134 beats per minute.  EKG showed atrial fibrillation with a rapid  ventricular response and left bundle  branch block.  The left bundle  branch block was unchanged from his prior EKG.  A Cardizem drip was then  started at 10 mg per hour, which is the approximate dose of his p.o.  medication.  Ventricle response slowed approximately to 100 beats per  minute and by the time that he had reached the hospital floor he was  back in sinus rhythm with a heart rate of about 80.  He was admitted for  observation with heparin treatment and institution of Coumadin therapy.  The patient was initially started with Coumadin 7.5 mg p.o. x1 and then  increased after day one to Coumadin 10 mg daily.  His INR has slowly  risen to 1.4 on the day of discharge.  The patient has remained on  heparin drip, which will be discontinued and changed over to Lovenox  until he is fully therapeutic on his Coumadin.  The patient's blood  pressure was normotensive and then on February 08, 2007 it was noted  that he was slightly hypertensive, so his irbesartan was increased from  150 mg to 300 mg daily.  The patient remained asymptomatic during his  course of stay.  His heart rate has ranged from 47 to 65 after having  been placed on Cardizem CD at 240 mg daily after the Cardizem IV drip  was discontinued.   PAST MEDICAL HISTORY:  1. Single vessel coronary artery disease with total occlusion of his      proximal right coronary artery and very good collateral flow      distally.  His catheterization was done in 2004.  It was also noted      that he had a moderate 50% stenosis in his left anterior      descending.  His coronary artery disease has been treated      medically.  Also we noted critical right renal artery stenosis and      a followup cath was scheduled for insertion of right renal artery      stent.  He has also a history of abdominal aortic aneurysm, which      was repaired with a graft in 1994.  During that grafting, his renal      arteries were reinserted into the graft and this was followed by      renal  artery stenosis in 2004.  2. Hypothyroidism, hyperlipidemia.   FAMILY HISTORY:  Father died of a heart attack at age 60.  His brother  has heart disease and has left ventricular dysfunction.   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   REVIEW OF SYSTEMS:  The patient has hypothyroidism and is presently on  Synthroid.  Review of systems for all 10 organs systems was negative  except for pertinent positives stated above.   PHYSICAL EXAMINATION:  VITAL SIGNS:  Physical exam on admission -  afebrile, pulse 107, irregular, respirations 18 and blood pressure  104/64.  HEENT:  Anicteric, EOMI, no nystagmus, pupils 1.5 mm, symmetric.  NECK:  No JVD, no bruit.  HEART:  Irregular, irregular, S1, S2.  LUNGS:  Clear to auscultation bilaterally.  EXTREMITIES:  No cyanosis, clubbing or edema.   PHYSICAL EXAMINATION:  VITAL SIGNS:  Physical exam on discharge February 09, 2007 - temperature 97.5, pulse 57, respirations 20, blood pressure  131/80, pulse ox 98% on room air.  HEENT:  Anicteric.  NECK:  No JVD.  HEART:  Regular rate and rhythm.  S1, S2 with a 2/6 systolic ejection  murmur right upper sternal border.  LUNGS:  Clear to auscultation bilaterally.  EXTREMITIES:  No cyanosis, clubbing or edema.   LABS:  February 05, 2007 WBC 7.0, hemoglobin 16.5, platelet count 161.  PT 14.4, INR 1.1, PTT 31.  Sodium 139, potassium 4.0, BUN 18, creatinine  1.25, glucose 155.  Troponin I less than 0.05.  CPK-MB 4.9.   Labs February 06, 2007 TSH 0.453 (0.35 - 5.5), free T4 1.22 (0.89 -  1.8).   Labs February 09, 2007 PT 17.2, INR 1.4.  WBC 6.6, hemoglobin 14.6,  platelet count 153.   Chest x-ray February 05, 2007 showed no acute findings.   EKG February 05, 2007 showed atrial fibrillation with a ventricular rate  of 134, normal axis, left bundle branch block; old.   ASSESSMENT:  1. Paroxysmal atrial fibrillation.  2. Coronary artery disease.  3.  Renal artery stenosis status post angioplasty/stent.  4. Abdominal aortic aneurysm repair.  5.  Hypertension.  6. Hyperlipidemia.  7. Hypothyroidism.   PLAN:  The patient will be discharged on Coumadin 10 mg daily.  We will  stop the heparin and switch him over to Lovenox 100 mg subcutaneous  b.i.d. until he is therapeutic on his Coumadin.  The patient will come  to the office tomorrow for a PT/INR.  We will also order a cardiac echo  as an outpatient to evaluate his ejection fraction, as well as his  murmur.  The patient will stop his irbesartan 300 mg daily and switch  over to Benicar 20 mg daily.  The patient will monitor his blood  pressure at home and if it is elevated we can increase his Benicar to 40  mg daily.  The patient will continue on his Cardizem CD 240 mg daily for  control of his blood pressure.  He will remain on Synthroid 0.15 mg  daily for his hypothyroidism.  The patient will stop his aspirin.  He  can remain on his over the counter vitamins.  The patient will follow up  with Dr. Selena Batten as stated above for a PT/INR tomorrow and blood pressure  check.      Massie Maroon, MD  Electronically Signed     JYK/MEDQ  D:  02/09/2007  T:  02/09/2007  Job:  [id number]

## 2010-08-01 NOTE — Letter (Signed)
August 19, 2009   Massie Maroon, MD  [address]  [place] [address]   Re:  REVAN, GENDRON              DOB:  1933-10-30   Dear Dr. Selena Batten, I saw the patient for his left lower lobe two nodules,  which is 2.1 x 1.4.  A PET scan was done, which showed no uptake in this  area.  However, on the PET scan there was a new infiltrate in the right  lower lobe and some questionably he has uptake in the lymph node, which  would go along the patient.  In talking to the patient, he has had a  cough and some dyspnea.  He also has had a recent pacemaker by Dr.  Ladona Ridgel and had complained of some weakness and some chest pain.  He quit  smoking in 1995.  His multiple medications include Metanx, levothyroxine  for hypothyroidism, diltiazem, Nexium for GERD, Lipitor for  dyslipidemia, calcium, Benicar for hypertension, fish oil, glucosamine,  multiple other vitamins, Jantoven 4 mg twice a day, omeprazole 20 mg  daily and Tylenol.  Z-Pak causes him to have diarrhea.   FAMILY HISTORY:  Positive for vascular disease.   SOCIAL HISTORY:  He is married, has 4 children.  Retired.  Quit smoking  in 1995.  He does not drink alcohol on a regular basis.   REVIEW OF SYSTEMS:  VITAL SIGNS:  He is 216 pounds.  He is 6 foot 2.  He  has had some weight loss, loss of appetite.  CARDIAC:  He has had some angina, atrial fibrillation, palpitations, and  chest tightness.  PULMONARY:  He has had a cough.  GI:  He has got reflux, hiatal hernia, constipation, and diarrhea.  GU:  No kidney disease, dysuria, or frequent urination.  VASCULAR:  No claudication, DVT, or TIAs.  NEUROLOGICAL:  Dizziness.  No headaches, blackouts, or seizures.  MUSCULOSKELETAL:  Arthritis.  PSYCHIATRIC:  No depression or nervousness.  NEUROLOGICAL:  He is on Coumadin.  No clotting disorders.  EYE/ENT:  He has had a recent decrease in his eyesight, hearing is  unchanged.   PHYSICAL EXAMINATION:  GENERAL:  He is a well-developed Caucasian  male,  in no acute distress.  VITAL SIGNS:  His blood pressure is 190/60.  His pulse is 86 and  regular.  Respirations 96%.  HEAD, EYES, EARS, NOSE AND THROAT:  Unremarkable.  Uvula is in the  midline.  NECK:  Supple without thyromegaly.  CHEST:  He has a left dual chamber pacemaker.  LUNGS:  Clear to auscultation and percussion.  HEART:  Irregular rate and rhythm with a 2/6 systolic ejection murmur.  ABDOMEN:  Soft.  No hepatosplenomegaly.  EXTREMITIES:  Pulses 2+.  There is no clubbing or edema.  NEUROLOGICAL:  He is oriented x3.  Sensory and motor intact.   From the standpoint of the left, he was referred for his left lower lobe  nodule and as it appears benign, there is an 80% chance that this is a  benign lesion and 20% this is a slow-growing cancer, so I recommend a  repeat CT on that in 3 months.  The right lower lobe infiltrate and the  mild uptake in the adenopathy would go along with the infectious process  and I have recommended that he put him on doxycycline 100 mg twice a day  and recommended that he follow up with you in 1 week.  I  did explain to  him that he could possibly get some diarrhea from the doxycycline.  I  will see him again in 3 months, but I would recommend he probably get a  repeat chest x-ray in at least a week to 2 weeks.   Ines Bloomer, M.D.  Electronically Signed   DPB/MEDQ  D:  08/19/2009  T:  08/20/2009  Job:  [id number]   cc:   Doylene Canning. Ladona Ridgel, MD

## 2010-08-01 NOTE — H&P (Signed)
NAME:  Trevor Simpson, Trevor Simpson NO.:  [id number]   MEDICAL RECORD NO.:  [id number]          PATIENT TYPE:  EMS   LOCATION:  ED                           FACILITY:  [facility]   PHYSICIAN:  Antionette Char, MD    DATE OF BIRTH:  07-21-1933   DATE OF ADMISSION:  02/05/2007  DATE OF DISCHARGE:                              HISTORY & PHYSICAL   CHIEF COMPLAINT:  Palpitation and weakness.   HISTORY OF PRESENT ILLNESS:  This 77 year old male presented to the  emergency room today after the onset of weakness and palpitations.  He  has a history of having similar episodes for 4-5 years and in 2004 had a  prolong episode which required cardioversion.  Since then he has had  monthly episodes lasting from several hours to a maximum of 2 days at  which time he gets very similar symptoms with marked weakness and  palpitations and has failed to come to the ER or to her office for  documentation since his initial episode in 2004.  He has been on  Cardizem 240 mg a day which has essentially controlled his episodes.  Today, he was in the hospital with his wife who was having an endoscopy,  and when the episode occurred, he came to the emergency room.  In a  emergency room, telemetry showed atrial fibrillation with a ventricular  response of 134 per minute.  The patient has run out of Cardizem 2 days  ago and had planned on stopping by the pharmacy on the way home today to  pick up his renewed dose of Cardizem.  A 12-lead electrocardiogram done  in the emergency room showed atrial fibrillation with rapid ventricular  response and left bundle-branch block.  The left bundle-branch block was  unchanged from his prior ECGs with ventricular response.  A Cardizem  drip was then started at 10 mg per hour which is the approximate dose of  his p.o. medication.  The ventricular response slowed to approximately  100 per minute and his blood pressure has remained stable at 90 to 100  systolic.  He is now  being admitted for observation with heparin  treatment and institution of Coumadin therapy.   PAST MEDICAL HISTORY:  The patient has a history of single-vessel  coronary artery disease with total occlusion of his proximal right  coronary artery and very good collateral flow distally.  His cardiac  catheterization was done in 2004.  Also with that cath it was noted a  moderate 50% stenosis in his left anterior descending.  His coronary  artery disease has been treated medically.  Also, we noted critical  right renal artery stenosis and a follow up cath was scheduled for  insertion of his right renal artery stent.  He has a history of  abdominal aortic aneurysm which was repaired with a graft in 1994.  During that grafting, his renal arteries were reinserted into the graft  and this was followed by renal artery stenosis in 2004.   REVIEW OF SYSTEMS:  The patient has hypothyroidism and it is presently  on Synthroid.  The  remainder of his review of systems is  noncontributory.   MEDICATIONS:  1. Aspirin 81 mg q.d.  2. Lipitor 20 mg q.d.  3. Benicar 20 mg q.d.  4. Synthroid 150 mcg q.d.  5. Cardizem CD 240 mg q.d.   ALLERGIES:  None.   FAMILY HISTORY:  Father died of a heart attack at age 10, his brother  has heart disease and has left ventricular dysfunction.   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   PHYSICAL EXAMINATION:  VITAL SIGNS:  Afebrile.  Pulse 107 and irregular,  respirations 18 and nonlabored, blood pressure 104/64.  GENERAL:  The patient was alert and oriented.  He has mild hearing  impairment.  SKIN:  Warm and dry.  HEENT:  Head is normocephalic.  Ears, nose, and throat clear.  External  eye exam normal. Funduscopic exam normal.  NECK:  No jugular venous distention.  Normal carotid pulses.  No bruits.  CHEST:  Symmetrical.  Good expansion bilaterally.  Normal breath sounds.  No rales, rhonchi, or  wheezes.  CARDIOVASCULAR:  Normal PMI.  Normal first and second heart sound.  Rhythm is irregular.  ABDOMEN:  Flat, nontender.  Normal bowel sounds.  GU:  Deferred.  RECTAL:  Deferred.  EXTREMITIES:  Full range of motion.  No peripheral edema.  Normal  peripheral pulses.  NEUROLOGIC:  Grossly intact.   IMPRESSION ON ADMISSION:  Paroxysmal atrial fibrillation.   PLAN OF ACTION:  The patient will be admitted and continued on the  Cardizem drip.  We will also restart his home medications and add  Coumadin in a starting dose as per pharmacy.  We will also start heparin  IV.      Antionette Char, MD  Electronically Signed     JRT/MEDQ  D:  02/05/2007  T:  02/06/2007  Job:  [id number]

## 2010-08-01 NOTE — Letter (Signed)
December 13, 2009   Massie Maroon, MD  [address]  [place], [place] [address]   Re:  Trevor Simpson, Trevor Simpson              DOB:  1933/07/11   Dear Dr. Selena Batten:   I saw the patient back today and reviewed his CT scan and his left lower  lobe lesion appears to have resolved.  Wait for the final reading.  This  is a preliminary reading, but we will see him back again in 9 months  with another check just to be sure with the CT scan.  His blood pressure  was 105/66, pulse 70, respirations 18, sats were 96%.   Ines Bloomer, M.D.  Electronically Signed   DPB/MEDQ  D:  12/13/2009  T:  12/14/2009  Job:  [id number]

## 2010-08-01 NOTE — Op Note (Signed)
NAME:  Trevor Simpson, Trevor Simpson NO.:  [id number]   MEDICAL RECORD NO.:  [id number]          PATIENT TYPE:  AMB   LOCATION:  ENDO                         FACILITY:  [facility]   PHYSICIAN:  Georgiana Spinner, M.D.    DATE OF BIRTH:  [birth year]   DATE OF PROCEDURE:  DATE OF DISCHARGE:                               OPERATIVE REPORT   PROCEDURE:  Colonoscopy.   INDICATIONS:  Colon polyps.   ANESTHESIA:  Fentanyl 75 mcg, Versed 7 mg.   DESCRIPTION OF PROCEDURE:  With the patient mildly sedated in the left  lateral decubitus position, the Pentax videoscopic colonoscope was  inserted in the rectum.  After a normal rectal exam was performed by me,  it was passed under direct vision with pressure applied to reach the  cecum, identified by ileocecal valve and appendiceal orifice, both of  which were photographed.  From this point, the colonoscope was slowly  withdrawn taking circumferential views of the colonic mucosa, stopping  in the rectum which appeared normal in direct view and showed  hemorrhoids on retroflexed view.  The endoscope was straightened and  withdrawn.  The patient's vital signs and pulse oximeter remained  stable.  The patient tolerated the procedure well without apparent  complications.   FINDINGS:  Internal hemorrhoids, otherwise unremarkable exam.   PLAN:  Repeat examination in 5 years.           ______________________________  Georgiana Spinner, M.D.     GMO/MEDQ  D:  01/27/2008  T:  01/27/2008  Job:  [id number]

## 2010-08-03 ENCOUNTER — Encounter: Payer: Self-pay | Admitting: Internal Medicine

## 2010-08-04 NOTE — Op Note (Signed)
NAME:  Trevor Simpson, Trevor Simpson NO.:  [id number]   MEDICAL RECORD NO.:  [id number]                   PATIENT TYPE:  AMB   LOCATION:  ENDO                                 FACILITY:  [facility]   PHYSICIAN:  Georgiana Spinner, M.D.                 DATE OF BIRTH:  08-02-1933   DATE OF PROCEDURE:  06/22/2003  DATE OF DISCHARGE:                                 OPERATIVE REPORT   PROCEDURE PERFORMED:  Upper endoscopy.   ENDOSCOPIST:  Georgiana Spinner, M.D.   INDICATIONS FOR PROCEDURE:  Gastroesophageal reflux disease.   ANESTHESIA:  Demerol 40 mg, Versed 4 mg.   DESCRIPTION OF PROCEDURE:  With the patient mildly sedated in the left  lateral decubitus position, the Olympus video endoscope was inserted in the  mouth and passed under direct vision through the esophagus which appeared  normal into the stomach.  The fundus, body, antrum, duodenal bulb and second  portion of the duodenum were visualized.  From this point, the endoscope was  slowly withdrawn taking circumferential views of the entire duodenal mucosa  until the endoscope was pulled back into the stomach and placed on  retroflexion to view the stomach from below.  The endoscope was then  straightened and withdrawn taking circumferential views of the remaining  gastric and esophageal mucosa.  The patient's vital signs and pulse oximeter  remained stable.  The patient tolerated the procedure well without apparent  complications.   FINDINGS:  Minimal redness of the duodenal bulb consistent with a mild  duodenitis, otherwise unremarkable exam.   PLAN:  Proceed to colonoscopy.                                               Georgiana Spinner, M.D.    GMO/MEDQ  D:  06/22/2003  T:  06/22/2003  Job:  [id number]

## 2010-08-04 NOTE — Cardiovascular Report (Signed)
NAME:  Trevor Simpson, Trevor Simpson NO.:  [id number]   MEDICAL RECORD NO.:  [id number]                   PATIENT TYPE:  OIB   LOCATION:  2899                                 FACILITY:  [facility]   PHYSICIAN:  Aram Candela. Tysinger, M.D.              DATE OF BIRTH:  1933/09/21   DATE OF PROCEDURE:  03/25/2002  DATE OF DISCHARGE:                              CARDIAC CATHETERIZATION   PROCEDURES:  1. Left heart catheterization.  2. Coronary cineangiography.  3. Left internal mammary artery cineangiography.  4. Left ventricular cineangiography.  5. Abdominal aortogram.  6. Selective right renal artery angiography.  7. Perclose to the right femoral artery.   INDICATIONS FOR PROCEDURES:  This 77 year old male has a history of  peripheral vascular disease status post aortic aneurysm repair approximately  10 years ago with reinsertion of his renal arteries.  He recently has had a  marked decrease in exertion tolerance with marked fatigue and also had an  episode of SVT with EKG changes suggesting ischemia in the post  cardioversion tracing.  He then had a radionuclear angiogram which showed  evidence of inferior myocardial ischemia.  He was then scheduled for cardiac  catheterization.  He also has a long history of hypertension.   DESCRIPTION OF PROCEDURE:  After signing an informed consent, the patient  was premedicated with 50 mg of Benadryl intravenously and brought to the  cardiac catheterization lab.  His right groin was prepped and draped in a  sterile fashion and anesthetized locally with 1% lidocaine.  A 6 French  introducer sheath was inserted percutaneously into the  right femoral  artery.  The 6 [place] #4 Judkins coronary catheters were used to make  injections into the native coronary arteries.  The right coronary catheter  was used to make a midstream injection into the left subclavian artery  visualizing the left internal mammary artery.  A 6 French pigtail  catheter  was used to measure pressures in the left ventricle and aorta and to make  midstream injections into the left ventricle and abdominal aorta.  The right  coronary catheter was reinserted to make selective injections into the right  renal artery.  The patient tolerated the procedure well and no complications  were noted.  At the end of the procedure the catheter and sheath were  removed from the right femoral artery and hemostasis was easily obtained  with a Perclose closure system.   MEDICATIONS GIVEN:  None.   HEMODYNAMIC DATA:  Left ventricular pressure 171/6 to 20, aortic pressure  173/67 with a mean of 106.  Right renal artery pressure 93/55 with mean of  72.  Left ventricular ejection fraction estimated at 60%.   CINE FINDINGS:  Coronary cineangiography:  Left coronary artery, the ostium  and left main appear normal.  Left anterior descending:  The LAD has a  segmental plaque in his proximal segment which causes approximately 6%  narrowing over the entire segment.  The first anterolateral branch and the  first septal branch appear normal.  The mid and distal LAD is normal in  appearance and is a large vessel which wraps around the apex.  Circumflex  coronary artery:  The circumflex is a small vessel which appears normal in  the AV groove. The first obtuse marginal branch has a mild to moderate  stenosis with plaque in its proximal segment causing 30 to 40% stenosis.  Optional diagonal branch:  There was a large optional diagonal branch which  has numerous side branches and appears normal.  Right coronary artery:  The  right coronary artery is totally occluded in its proximal segment just  distal to the conus and right atrial branches.  There is a long segment  which is nonvisualized.  The distal segment feels retrograde from the left  coronary artery with visualization of the acute angle and distal right  coronary artery with evidence of diffuse disease throughout the  middle  segment.  Left internal mammary artery appears normal.   Left ventricular cineangiogram:  The left ventricular chamber size appears  normal.  Left ventricular wall thickness appears normal.  The overall  ventricular contractility is normal with normal contractility in all  segments except for the inferobasilar area which is hypokinetic.  The  ejection fraction was estimated at 60%.   Abdominal Aortogram:  The abdominal aorta was widely patent without evidence  of aneurysm.  The proximal anastomotic side of the graft appears normal with  normal flow.  The left renal artery has a normal appearance with normal  flow. The right renal artery was very hazy in its proximal segment with poor  flow.   Selective right renal artery angiography:  Selective injection into the  right renal artery showed a critical stenosis of 90% at its takeoff.   FINAL DIAGNOSES:  1. Chronic total occlusion of the proximal right coronary artery.  2. Moderate stenosis proximal left anterior descending.  3. Normal left internal mammary artery.  4. Normal left ventricular function with mild inferobasilar hypokinesia.  5. Critical stenosis right renal artery.  6. Good function of the abdominal aortic graft.  7. Successful Perclose of the right femoral artery.   DISPOSITION:  Will discuss these findings with Dr. Lendell Caprice and Dr. Lucas Mallow  prior to making further disposition.  Things to consider would be possible  coronary artery bypass graft surgery if we feel that his proximal left  anterior descending lesion is significant.  There was no ischemia shown on  the radionuclear stress test, however, so his symptomatology will play a  large part in determining whether he should be a surgical candidate.  He  also will need further evaluation of the flow in his right renal artery and  is most likely a candidate for renal artery angioplasty with stent  placement.                                              John  R. Tysinger, M.D.    JRT/MEDQ  D:  03/25/2002  T:  03/25/2002  Job:  [id number]   cc:   Janae Bridgeman. Eloise Harman., M.D.  [address]  [place]  [place] [address]  Fax: [phone]

## 2010-08-04 NOTE — Op Note (Signed)
NAME:  Trevor Simpson, SILAS NO.:  [id number]   MEDICAL RECORD NO.:  [id number]          PATIENT TYPE:  AMB   LOCATION:  DAY                          FACILITY:  [facility]   PHYSICIAN:  Velora Heckler, MD      DATE OF BIRTH:  28-Aug-1933   DATE OF PROCEDURE:  09/28/2004  DATE OF DISCHARGE:                                 OPERATIVE REPORT   PREOPERATIVE DIAGNOSIS:  Left inguinal hernia.   POSTOPERATIVE DIAGNOSIS:  Left inguinal hernia.   PROCEDURE:  Repair left inguinal hernia with polypropylene mesh.   SURGEON:  Velora Heckler, M.D.   ANESTHESIA:  General.   ESTIMATED BLOOD LOSS:  Minimal.   PREPARATION:  Betadine.   COMPLICATIONS:  None.   INDICATIONS:  The patient is a 77 year old male from [place], [place]  who presents with left inguinal hernia. The patient had a previous right  inguinal herniorrhaphy 10 years ago performed by myself. The patient has  developed a bulge and pain which radiates into the left testicle. He was  seen by his primary physician and referred for repair.   DESCRIPTION OF PROCEDURE:  The procedure was done in OR #11 at the [facility]. The patient is brought to the operating room,  placed in supine position on the operating room table. Following the  administration of general anesthesia, the patient is prepped and draped in  the usual strict aseptic fashion. After ascertaining that an adequate level  of anesthesia had been obtained, a left inguinal incision was made with a  #10 blade. Dissection was carried down through the subcutaneous tissues and  hemostasis obtained with the electrocautery. External oblique fascia was  incised in line with its fibers and extended through the external inguinal  ring. Cord structures are dissected out of the inguinal canal and encircled  with a Penrose drain. The floor of the inguinal canal was carefully defined.  The cord was explored. There was a large lipoma of the cord which  was  dissected out up to the level of the internal inguinal ring where it is  divided between hemostats and suture ligated with 3-0 Vicryl suture  ligatures. There is a moderate size indirect inguinal hernia sac which was  inverted. It is held in reduction with a interrupted figure-of-eight 2-0  silk suture. Next a sheet of polypropylene mesh is cut to the appropriate  dimensions. It is secured to the pubic tubercle and along the inguinal  ligament with a running 2-0 Novofil suture. The mesh is split to accommodate  the cord structures. The superior margin of the mesh was secured to the  transversalis and internal oblique fascia with interrupted 2-0 Novofil  sutures. The tails of the mesh are overlapped lateral to the cord structures  and the inferior edge of the tails of the mesh are secured to the inguinal  ligament with an interrupted 2-0 Novofil suture. Local field block is placed  with Marcaine. Cord structures were returned to the inguinal canal. The  external oblique fascia is closed with interrupted 3-0 Vicryl sutures. The  subcutaneous tissues were  closed with interrupted 3-0 Vicryl sutures. Skin  was anesthetized with local anesthetic.  Skin was closed with a running 4-0 Vicryl subcuticular suture. Wound is  washed and dried and Benzoin and Steri-Strips were applied. Sterile  dressings were applied. The patient is awakened from anesthesia and brought  to the recovery room in stable condition. The patient tolerated the  procedure well.       TMG/MEDQ  D:  09/28/2004  T:  09/28/2004  Job:  [id number]   cc:   Janae Bridgeman. Eloise Harman., M.D.  [address]  [place]  [place] [address]  Fax: [phone]   Antionette Char, MD  [address]  [place]  [place] [address]  Fax: [phone]

## 2010-08-04 NOTE — Op Note (Signed)
NAME:  Trevor Simpson, Trevor Simpson NO.:  [id number]   MEDICAL RECORD NO.:  [id number]                   PATIENT TYPE:  AMB   LOCATION:  ENDO                                 FACILITY:  [facility]   PHYSICIAN:  Georgiana Spinner, M.D.                 DATE OF BIRTH:  1933/11/15   DATE OF PROCEDURE:  06/22/2003  DATE OF DISCHARGE:                                 OPERATIVE REPORT   PROCEDURE:  Colonoscopy.   INDICATIONS:  Colon polyps.   ANESTHESIA:  Demerol 20 mg, Versed 2 mg.   PROCEDURE:  With the patient mildly sedated in the left lateral decubitus  position, the Olympus videoscopic colonoscope was inserted in the rectum and  passed under direct vision to the cecum, identified by ileocecal valve and  appendiceal orifice, both of which were photographed.  From this point the  colonoscope was slowly withdrawn, taking circumferential views of the  colonic mucosa, stopping first in the cecum, where a polyp was seen and  removed using snare cautery technique, setting of 20/20 blended current.  A  second polyp was seen just about a fold removed.  It was flat.  It was  smaller.  It was photographed, and it too was removed, this time using hot  biopsy forceps technique with the same setting.  Both tissues were retrieved  for pathology and placed in a solo bottle.  The endoscope was then withdrawn  all the way to the rectum, which appeared normal except for a small polyp,  which was photographed and removed using snare cautery technique, again a  setting of 20/20 blended current, and on retroflexion the endoscope was  placed and the endoscope was then straightened and withdrawn.  The patient's  vital signs and pulse oximetry remained stable.  The patient tolerated the  procedure well without apparent complications.   FINDINGS:  Polyps as described above in cecum and rectum.   PLAN:  Await biopsy report.  The patient will call me for results and follow  up with me as an  outpatient.                                               Georgiana Spinner, M.D.    GMO/MEDQ  D:  06/22/2003  T:  06/22/2003  Job:  [id number]

## 2010-08-04 NOTE — Cardiovascular Report (Signed)
NAME:  Trevor Simpson, Trevor Simpson NO.:  [id number]   MEDICAL RECORD NO.:  [id number]                   PATIENT TYPE:  OIB   LOCATION:  2855                                 FACILITY:  [facility]   PHYSICIAN:  John R. Tysinger, M.D.              DATE OF BIRTH:  09-Nov-1933   DATE OF PROCEDURE:  05/04/2002  DATE OF DISCHARGE:                              CARDIAC CATHETERIZATION   PROCEDURES:  1. Catheter placement, abdominal aorta, via the right femoral artery sheath.  2. Pressure measurements.  3. Right renal artery selective angiography.  4. Primary stenting of the right renal artery.  5. Perclose of the right femoral artery.   INDICATION FOR PROCEDURES:  This 77 year old male has a history of  peripheral vascular disease and was noted to have critical stenosis of his  right renal artery during a diagnostic cardiac catheterization earlier this  month.  He had a renal artery Duplex ultrasound in the office which showed a  markedly elevated flow velocity consistent with critical stenosis.  He was  then scheduled for angioplasty of the right renal artery ostial stenosis.   DESCRIPTION OF PROCEDURE:  After signing an informed consent, the patient  was premedicated with 50 mg Benadryl intravenously and brought to the  peripheral angiography lab on the 6th floor at [facility].  His  right groin was prepped and draped in a sterile fashion and anesthetized  locally with 1% lidocaine.  A 6-French introducer sheath was inserted  percutaneously into the right femoral artery.  A 6-French short right JR4  guide catheter was inserted through the right femoral artery sheath and  advanced to the mid-abdominal aorta.  Injections were made into the right  renal artery.  Pressures were recorded in the abdominal aorta and in the  right renal artery.  After noting a 90-mmHg pressure gradient across the  right renal artery lesion, we proceeded with the angioplasty procedure.   We  selected a 0.014 stabilizer wire, which was advanced through the guide  catheter and into the right renal artery.  We then selected a PG-1260  balloon angioplasty system which, after proper preparation, was inserted  over the guidewire and positioned within the ostial lesion in the right  renal artery.  Two inflations were made, the first at 10 atmospheres for 20  seconds and the second at 12 atmospheres for 30 seconds.  After this final  inflation, the balloon delivery system was removed and injection again in  the right renal artery showed an excellent angiographic result with 0%  residual lesion and no evidence for dissection or clot.  We also advanced  the guide catheter into the right renal artery and documented that there was  no residual gradient.  The patient tolerated the procedure well and no  complications were noted during the procedure.  The catheter and sheath were  removed from the right femoral artery and hemostasis was  easily obtained  with the Perclose closure system.   MEDICATIONS GIVEN:  Heparin 3000 units IV.   PRESSURES:  Aortic pressure 166/68 with a mean of 104.  Right renal artery  pressure 98-80/55 with a mean of 65.   CINE FINDINGS:   RIGHT RENAL ARTERY CINEANGIOGRAPHY:  Injections into the right renal artery  shows an ostial lesion of 80-90% with antegrade flow.  Further cine showed  proper positioning of the guidewire and stent deployment system and final  injections into the right renal artery showed an excellent angiographic  result with proper positioning of the stent and 0% residual lesion and  normal antegrade flow.  There was no evidence for clot or dissection.    FINAL DIAGNOSES:  1. Critical right renal artery stenosis, 80-90%, with 90 mmHg gradient.  2. Successful angioplasty with primary stenting of the right renal artery.  3. Successful Perclose of the right femoral artery.   DISPOSITION:  Will monitor on the short-stay unit prior to  discharge later  today.  Will continue Plavix for approximately one month.                                                John R. Tysinger, M.D.    JRT/MEDQ  D:  05/04/2002  T:  05/04/2002  Job:  [id number]   cc:   [facility]

## 2010-08-04 NOTE — Op Note (Signed)
NAME:  Trevor Simpson, Trevor Simpson NO.:  [id number]   MEDICAL RECORD NO.:  [id number]          PATIENT TYPE:  AMB   LOCATION:  ENDO                         FACILITY:  [facility]   PHYSICIAN:  Georgiana Spinner, M.D.    DATE OF BIRTH:  [birth year]   DATE OF PROCEDURE:  10/12/2005  DATE OF DISCHARGE:                                 OPERATIVE REPORT   PROCEDURE:  Colonoscopy.   INDICATIONS:  Colon polyps, previously removed.   ANESTHESIA:  Fentanyl 50 micrograms, Versed 5 mg.   DESCRIPTION OF PROCEDURE:  With the patient mildly sedated, in left lateral  decubitus position, rectal examination was performed, which was  unremarkable.  Subsequently, the Olympus videoscopic colonoscope was  inserted into the rectum, passed under direct vision with pressure applied.  We reached the cecum, identified by the ileocecal valve and appendiceal  orifice, both of which were photographed.  From this point, the colonoscope  was slowly withdrawn, taking circumferential views of the colonic mucosa,  stopping only in the cecum, and then subsequently at 60 cm from the anal  verge, at which point polyps were seen, photographed, and the first was  removed, using hot biopsy forceps technique; the second was removed, using  snare cautery technique, both with the setting of 20/200 blended current,  and both polyps were retrieved for pathology.  The endoscope was withdrawn  then all the way to the rectum, which appeared normal on direct and  retroflexed view.  The endoscope was straightened and withdrawn.  The  patient's vital signs and pulse oximetry remained stable.  The patient  tolerated the procedure well, without apparent complications.   FINDINGS:  Polyp of cecum, polyp at 60 cm from the anal verge, both removed.  Await biopsy report.  The patient will call me for results and follow up  with me as an outpatient.           ______________________________  Georgiana Spinner, M.D.     GMO/MEDQ  D:   10/12/2005  T:  10/12/2005  Job:  [id number]

## 2010-09-06 ENCOUNTER — Ambulatory Visit: Payer: Self-pay | Admitting: Thoracic Surgery

## 2010-09-12 ENCOUNTER — Ambulatory Visit (INDEPENDENT_AMBULATORY_CARE_PROVIDER_SITE_OTHER): Payer: Medicare Other | Admitting: Thoracic Surgery

## 2010-09-12 ENCOUNTER — Ambulatory Visit: Payer: Self-pay | Admitting: Thoracic Surgery

## 2010-09-12 ENCOUNTER — Ambulatory Visit
Admission: RE | Admit: 2010-09-12 | Discharge: 2010-09-12 | Disposition: A | Discharge status: Home / Self Care | Payer: Medicare Other | Source: Ambulatory Visit | Attending: Thoracic Surgery | Admitting: Thoracic Surgery

## 2010-09-12 DIAGNOSIS — C3490 Malignant neoplasm of unspecified part of unspecified bronchus or lung: Secondary | ICD-10-CM

## 2010-09-12 DIAGNOSIS — D381 Neoplasm of uncertain behavior of trachea, bronchus and lung: Secondary | ICD-10-CM

## 2010-09-12 MED ORDER — IOHEXOL 300 MG/ML  SOLN
75.0000 mL | Freq: Once | INTRAMUSCULAR | Status: AC | PRN
  Administered 2010-09-12: 75 mL via INTRAVENOUS

## 2010-09-13 ENCOUNTER — Ambulatory Visit: Payer: Self-pay | Admitting: Thoracic Surgery

## 2010-09-13 NOTE — Letter (Signed)
September 12, 2010  Massie Maroon, MD [address] [place], [place] [address]  Re:  Trevor Simpson, Trevor Simpson              DOB:  1933/11/21  Dear Dr. Selena Batten:  I saw the patient back in the office today for followup of his 10-mm left lower lobe lesion and followup CT scan shows that this is resolved, so this is probably an inflammatory lesion.  His blood pressure is 123/63, pulse 23, respirations 20, sats were 98%.  Because this is resolved, I will have him followup with you and will not need to see him back.  Ines Bloomer, M.D. Electronically Signed  DPB/MEDQ  D:  09/12/2010  T:  09/13/2010  Job:  [id number]

## 2010-09-27 ENCOUNTER — Encounter: Payer: Self-pay | Admitting: Internal Medicine

## 2010-10-03 ENCOUNTER — Ambulatory Visit (INDEPENDENT_AMBULATORY_CARE_PROVIDER_SITE_OTHER): Payer: Medicare Other | Admitting: Internal Medicine

## 2010-10-03 ENCOUNTER — Encounter: Payer: Self-pay | Admitting: Internal Medicine

## 2010-10-03 DIAGNOSIS — I495 Sick sinus syndrome: Secondary | ICD-10-CM

## 2010-10-03 DIAGNOSIS — I4891 Unspecified atrial fibrillation: Secondary | ICD-10-CM

## 2010-10-03 DIAGNOSIS — Z950 Presence of cardiac pacemaker: Secondary | ICD-10-CM

## 2010-10-03 DIAGNOSIS — I10 Essential (primary) hypertension: Secondary | ICD-10-CM

## 2010-10-03 LAB — PACEMAKER DEVICE OBSERVATION
AL THRESHOLD: 1.75 V
ATRIAL PACING PM: 66
BAMS-0001: 150 {beats}/min
DEVICE MODEL PM: 7105776
RV LEAD IMPEDENCE PM: 512.5 Ohm
RV LEAD THRESHOLD: 0.75 V

## 2010-10-03 NOTE — Assessment & Plan Note (Signed)
The blood pressure has mostly been well controlled. He will continue his current medical therapy and maintain a low-sodium diet.

## 2010-10-03 NOTE — Assessment & Plan Note (Signed)
His device is working normally. Will recheck in several months. 

## 2010-10-03 NOTE — Progress Notes (Signed)
HPI [name] returns today for followup. He is a pleasant 77 yo man with a h/o symptomatic bradycardia s/p PPM.  Allergies  Allergen Reactions  . Codeine   . Morphine      Current Outpatient Prescriptions  Medication Sig Dispense Refill  . atorvastatin (LIPITOR) 20 MG tablet Take 20 mg by mouth daily.        . B Complex-C (SUPER B COMPLEX) TABS Take by mouth 1 dose over 24 hours.        . calcium carbonate (OS-CAL) 600 MG TABS Take 600 mg by mouth 2 (two) times daily with a meal.        . Cholecalciferol 1000 UNITS capsule Take 1,000 Units by mouth daily.        Marland Kitchen diltiazem (CARDIZEM CD) 120 MG 24 hr capsule       . diltiazem (DILACOR XR) 120 MG 24 hr capsule Take 120 mg by mouth daily.        . fish oil-omega-3 fatty acids 1000 MG capsule Take 2 g by mouth daily.        Marland Kitchen levothyroxine (SYNTHROID, LEVOTHROID) 100 MCG tablet Take 100 mcg by mouth daily.        . magnesium gluconate (MAGONATE) 500 MG tablet Take 500 mg by mouth 1 dose over 24 hours.        . Multiple Vitamin (MULTIVITAMIN) capsule Take 1 capsule by mouth daily.        Marland Kitchen olmesartan-hydrochlorothiazide (BENICAR HCT) 40-12.5 MG per tablet Take 1 tablet by mouth daily.        Marland Kitchen omeprazole (PRILOSEC) 20 MG capsule Take 20 mg by mouth daily.        [name] Take by mouth 2 (two) times daily.        . vitamin C (ASCORBIC ACID) 500 MG tablet Take 500 mg by mouth daily.        Marland Kitchen warfarin (COUMADIN) 4 MG tablet Take 4 mg by mouth daily.        . cetirizine (ZYRTEC) 10 MG tablet          Past Medical History  Diagnosis Date  . Coronary artery disease   . Other and unspecified hyperlipidemia   . Unspecified essential hypertension   . Atrial fibrillation   . Sinoatrial node dysfunction   . PVD (peripheral vascular disease)   . Pacemaker   . AAA (abdominal aortic aneurysm)     ROS:   All systems reviewed and negative except as noted in the HPI.   Past Surgical History  Procedure Date  . Inguinal hernia  repair     left  . Cardiac pacemaker placement 08/01/2009    st jude dual chamber  . Colonoscopy   . Esophagogastroduodenoscopy   . Translumminal angioplasty      No family history on file.   History   Social History  . Marital Status: Married    Spouse Name: N/A    Number of Children: N/A  . Years of Education: N/A   Occupational History  . Not on file.   Social History Main Topics  . Smoking status: Former Games developer  . Smokeless tobacco: Current User    Types: Chew  . Alcohol Use: No  . Drug Use: No  . Sexually Active: Not on file   Other Topics Concern  . Not on file   Social History Narrative  . No narrative on file     BP 122/68  Pulse 62  Ht 6' (1.829 m)  Wt 213 lb (96.616 kg)  BMI 28.89 kg/m2  Physical Exam:  Well appearing NAD HEENT: Unremarkable Neck:  No JVD, no thyromegally Lymphatics:  No adenopathy Back:  No CVA tenderness Lungs:  Clear. Well healed PPM incision. HEART:  Regular rate rhythm, no murmurs, no rubs, no clicks Abd:  soft, positive bowel sounds, no organomegally, no rebound, no guarding Ext:  2 plus pulses, no edema, no cyanosis, no clubbing Skin:  No rashes no nodules Neuro:  CN II through XII intact, motor grossly intact  DEVICE  Normal device function.  See PaceArt for details.   Assess/Plan:

## 2010-10-03 NOTE — Patient Instructions (Signed)
Your physician wants you to follow-up in: 12 months with Dr. Taylor. You will receive a reminder letter in the mail two months in advance. If you don't receive a letter, please call our office to schedule the follow-up appointment.    

## 2010-10-03 NOTE — Assessment & Plan Note (Signed)
He is mostly maintaining NSR but does have spells lasting up to a day. He is minimally symptomatic. I have asked him to contact us if his palpitations increase in frequency and severity.

## 2010-12-26 LAB — BASIC METABOLIC PANEL
CO2: 28
Calcium: 9.3
Creatinine, Ser: 1.25
GFR calc Af Amer: >60
Glucose, Bld: 155 — ABNORMAL HIGH

## 2010-12-26 LAB — CBC
HCT: 41.6
HCT: 42
Hemoglobin: 14.1
Hemoglobin: 15.2
MCHC: 33.9
MCHC: 34.7
MCHC: 34.9
MCV: 92.1
Platelets: 153
Platelets: 160
RBC: 4.38
RBC: 4.73
RDW: 13.3
WBC: 6.5
WBC: 6.6

## 2010-12-26 LAB — PROTIME-INR
INR: 1.1
INR: 1.3
INR: 1.4
Prothrombin Time: 14.2
Prothrombin Time: 14.4
Prothrombin Time: 17.2 — ABNORMAL HIGH

## 2010-12-26 LAB — HEPARIN LEVEL (UNFRACTIONATED)
Heparin Unfractionated: 0.36
Heparin Unfractionated: 0.37
Heparin Unfractionated: 0.4

## 2010-12-26 LAB — DIFFERENTIAL
Basophils Absolute: 0.1
Basophils Relative: 1
Neutro Abs: 4.5
Neutrophils Relative %: 64

## 2010-12-26 LAB — TSH: TSH: 0.453

## 2010-12-26 LAB — APTT: aPTT: 31

## 2011-01-04 ENCOUNTER — Encounter: Payer: Medicare Other | Admitting: *Deleted

## 2011-01-08 ENCOUNTER — Other Ambulatory Visit: Payer: Self-pay | Admitting: Internal Medicine

## 2011-01-08 ENCOUNTER — Encounter: Payer: Self-pay | Admitting: *Deleted

## 2011-01-08 DIAGNOSIS — I6529 Occlusion and stenosis of unspecified carotid artery: Secondary | ICD-10-CM

## 2011-01-09 ENCOUNTER — Encounter (INDEPENDENT_AMBULATORY_CARE_PROVIDER_SITE_OTHER): Payer: Medicare Other | Admitting: *Deleted

## 2011-01-09 DIAGNOSIS — I6529 Occlusion and stenosis of unspecified carotid artery: Secondary | ICD-10-CM

## 2011-05-28 ENCOUNTER — Encounter: Payer: Self-pay | Admitting: *Deleted

## 2011-06-13 ENCOUNTER — Encounter: Payer: Self-pay | Admitting: Internal Medicine

## 2011-06-13 ENCOUNTER — Ambulatory Visit (INDEPENDENT_AMBULATORY_CARE_PROVIDER_SITE_OTHER): Payer: Medicare Other | Admitting: *Deleted

## 2011-06-13 DIAGNOSIS — I495 Sick sinus syndrome: Secondary | ICD-10-CM

## 2011-06-13 DIAGNOSIS — I4891 Unspecified atrial fibrillation: Secondary | ICD-10-CM

## 2011-06-13 LAB — PACEMAKER DEVICE OBSERVATION
AL AMPLITUDE: 5 mv
AL IMPEDENCE PM: 412.5 Ohm
AL THRESHOLD: 1.875 V
RV LEAD AMPLITUDE: 12 mv
RV LEAD IMPEDENCE PM: 525 Ohm

## 2011-06-13 NOTE — Progress Notes (Signed)
Pacer check in clinic  

## 2011-08-21 ENCOUNTER — Encounter: Payer: Self-pay | Admitting: Internal Medicine

## 2012-01-03 ENCOUNTER — Other Ambulatory Visit: Payer: Self-pay | Admitting: *Deleted

## 2012-01-03 DIAGNOSIS — I6529 Occlusion and stenosis of unspecified carotid artery: Secondary | ICD-10-CM

## 2012-01-08 ENCOUNTER — Encounter: Payer: Medicare Other | Admitting: Internal Medicine

## 2012-01-09 ENCOUNTER — Encounter: Payer: Self-pay | Admitting: *Deleted

## 2012-01-15 ENCOUNTER — Ambulatory Visit (INDEPENDENT_AMBULATORY_CARE_PROVIDER_SITE_OTHER): Payer: Medicare Other | Admitting: Internal Medicine

## 2012-01-15 ENCOUNTER — Encounter: Payer: Self-pay | Admitting: Internal Medicine

## 2012-01-15 ENCOUNTER — Encounter (INDEPENDENT_AMBULATORY_CARE_PROVIDER_SITE_OTHER): Payer: Medicare Other

## 2012-01-15 VITALS — BP 158/69 | HR 56 | Ht 72.0 in | Wt 214.0 lb

## 2012-01-15 DIAGNOSIS — I6529 Occlusion and stenosis of unspecified carotid artery: Secondary | ICD-10-CM

## 2012-01-15 DIAGNOSIS — R0789 Other chest pain: Secondary | ICD-10-CM

## 2012-01-15 DIAGNOSIS — R079 Chest pain, unspecified: Secondary | ICD-10-CM

## 2012-01-15 DIAGNOSIS — I4891 Unspecified atrial fibrillation: Secondary | ICD-10-CM

## 2012-01-15 DIAGNOSIS — Z950 Presence of cardiac pacemaker: Secondary | ICD-10-CM

## 2012-01-15 LAB — PACEMAKER DEVICE OBSERVATION
AL IMPEDENCE PM: 400 Ohm
ATRIAL PACING PM: 58
BAMS-0001: 150 {beats}/min
BAMS-0003: 70 {beats}/min
BATTERY VOLTAGE: 2.9478 V

## 2012-01-15 MED ORDER — NITROGLYCERIN 0.4 MG SL SUBL: 0.4000 mg | SUBLINGUAL_TABLET | SUBLINGUAL | Status: DC | PRN

## 2012-01-15 MED ORDER — METOPROLOL TARTRATE 25 MG PO TABS: 25.0000 mg | ORAL_TABLET | ORAL | Status: DC | PRN

## 2012-01-15 NOTE — Assessment & Plan Note (Signed)
This is a new problem. Because he is unable to walk and because he has a pacemaker, I recommended a perfusion Myoview scan. This will be scheduled in the next several days.

## 2012-01-15 NOTE — Assessment & Plan Note (Signed)
His St. Jude dual-chamber pacemaker is working normally. We'll plan to recheck in several months. 

## 2012-01-15 NOTE — Assessment & Plan Note (Signed)
He is maintaining sinus rhythm 92% of the time. No change in medical therapy for now.

## 2012-01-15 NOTE — Patient Instructions (Signed)
Your physician has requested that you have a lexiscan myoview. For further information please visit [email or web address] Please follow instruction sheet, as given.  Your physician has recommended you make the following change in your medication: TAKE Metoprolol 25 mg as needed for palpitations

## 2012-01-15 NOTE — Progress Notes (Signed)
HPI Trevor Simpson returns today for followup. He is a very pleasant 78 year old man with a history of atrial fibrillation, symptomatic tachycardia bradycardia syndrome, hypertension, status post permanent pacemaker insertion. In the interim, he notes new onset of substernal chest pain. This is not related to exertion. It is not related to dietary intake. No syncope. No associated shortness of breath. The patient is fairly sedentary. He is unable to walk much because of arthritis. Allergies  Allergen Reactions  . Codeine   . Morphine      Current Outpatient Prescriptions  Medication Sig Dispense Refill  . atorvastatin (LIPITOR) 20 MG tablet Take 20 mg by mouth daily.        . B Complex-C (SUPER B COMPLEX) TABS Take by mouth 1 dose over 24 hours.        . calcium carbonate (OS-CAL) 600 MG TABS Take 600 mg by mouth 2 (two) times daily with a meal.        . cetirizine (ZYRTEC) 10 MG tablet 10 mg daily.       . Cholecalciferol 1000 UNITS capsule Take 1,000 Units by mouth daily.        Marland Kitchen diltiazem (DILACOR XR) 120 MG 24 hr capsule Take 120 mg by mouth daily.        . fish oil-omega-3 fatty acids 1000 MG capsule Take 2 g by mouth daily.        Marland Kitchen levothyroxine (SYNTHROID, LEVOTHROID) 112 MCG tablet Take 112 mcg by mouth daily.      . magnesium gluconate (MAGONATE) 500 MG tablet Take 500 mg by mouth 1 dose over 24 hours.        . Multiple Vitamin (MULTIVITAMIN) capsule Take 1 capsule by mouth daily.        Marland Kitchen olmesartan-hydrochlorothiazide (BENICAR HCT) 40-12.5 MG per tablet Take 1 tablet by mouth daily.        Marland Kitchen omeprazole (PRILOSEC) 20 MG capsule Take 20 mg by mouth daily as needed.       [name] Take by mouth 2 (two) times daily.        . vitamin C (ASCORBIC ACID) 500 MG tablet Take 500 mg by mouth daily.        [name] 20 MG TABS Take 1 tablet by mouth daily.      . metoprolol tartrate (LOPRESSOR) 25 MG tablet Take 1 tablet (25 mg total) by mouth as needed.  60 tablet  1  .  nitroGLYCERIN (NITROSTAT) 0.4 MG SL tablet Place 1 tablet (0.4 mg total) under the tongue every 5 (five) minutes as needed for chest pain.  25 tablet  3  . DISCONTD: diltiazem (CARDIZEM CD) 120 MG 24 hr capsule       . DISCONTD: warfarin (COUMADIN) 4 MG tablet Take 4 mg by mouth daily.           Past Medical History  Diagnosis Date  . Coronary artery disease   . Other and unspecified hyperlipidemia   . Unspecified essential hypertension   . Atrial fibrillation   . Sinoatrial node dysfunction   . PVD (peripheral vascular disease)   . Pacemaker   . AAA (abdominal aortic aneurysm)     ROS:   All systems reviewed and negative except as noted in the HPI.   Past Surgical History  Procedure Date  . Inguinal hernia repair     left  . Cardiac pacemaker placement 08/01/2009    st jude dual chamber  . Colonoscopy   .  Esophagogastroduodenoscopy   . Translumminal angioplasty      No family history on file.   History   Social History  . Marital Status: Married    Spouse Name: N/A    Number of Children: N/A  . Years of Education: N/A   Occupational History  . Not on file.   Social History Main Topics  . Smoking status: Former Games developer  . Smokeless tobacco: Current User    Types: Chew  . Alcohol Use: No  . Drug Use: No  . Sexually Active: Not on file   Other Topics Concern  . Not on file   Social History Narrative  . No narrative on file     BP 158/69  Pulse 56  Ht 6' (1.829 m)  Wt 214 lb (97.07 kg)  BMI 29.02 kg/m2  SpO2 98%  Physical Exam:  Well appearing 78 year old man, NAD HEENT: Unremarkable Neck:  No JVD, no thyromegally Lungs:  Clear with no wheezes, rales, or rhonchi. HEART:  Regular rate rhythm, no murmurs, no rubs, no clicks Abd:  soft, positive bowel sounds, no organomegally, no rebound, no guarding Ext:  2 plus pulses, no edema, no cyanosis, no clubbing Skin:  No rashes no nodules Neuro:  CN II through XII intact, motor grossly  intact  DEVICE  Normal device function.  See PaceArt for details.   Assess/Plan:

## 2012-01-29 ENCOUNTER — Ambulatory Visit (HOSPITAL_COMMUNITY): Payer: Medicare Other | Attending: Cardiology | Admitting: Radiology

## 2012-01-29 VITALS — BP 141/95 | HR 81 | Ht 72.0 in | Wt 208.0 lb

## 2012-01-29 DIAGNOSIS — I10 Essential (primary) hypertension: Secondary | ICD-10-CM | POA: Insufficient documentation

## 2012-01-29 DIAGNOSIS — I779 Disorder of arteries and arterioles, unspecified: Secondary | ICD-10-CM | POA: Insufficient documentation

## 2012-01-29 DIAGNOSIS — Z87891 Personal history of nicotine dependence: Secondary | ICD-10-CM | POA: Insufficient documentation

## 2012-01-29 DIAGNOSIS — I739 Peripheral vascular disease, unspecified: Secondary | ICD-10-CM | POA: Insufficient documentation

## 2012-01-29 DIAGNOSIS — Z950 Presence of cardiac pacemaker: Secondary | ICD-10-CM

## 2012-01-29 DIAGNOSIS — Z8249 Family history of ischemic heart disease and other diseases of the circulatory system: Secondary | ICD-10-CM | POA: Insufficient documentation

## 2012-01-29 DIAGNOSIS — I2510 Atherosclerotic heart disease of native coronary artery without angina pectoris: Secondary | ICD-10-CM

## 2012-01-29 DIAGNOSIS — R079 Chest pain, unspecified: Secondary | ICD-10-CM

## 2012-01-29 DIAGNOSIS — R002 Palpitations: Secondary | ICD-10-CM | POA: Insufficient documentation

## 2012-01-29 DIAGNOSIS — R000 Tachycardia, unspecified: Secondary | ICD-10-CM | POA: Insufficient documentation

## 2012-01-29 DIAGNOSIS — I4891 Unspecified atrial fibrillation: Secondary | ICD-10-CM

## 2012-01-29 MED ORDER — TECHNETIUM TC 99M SESTAMIBI GENERIC - CARDIOLITE
10.0000 | Freq: Once | INTRAVENOUS | Status: AC | PRN
  Administered 2012-01-29: 10 via INTRAVENOUS

## 2012-01-29 MED ORDER — TECHNETIUM TC 99M SESTAMIBI GENERIC - CARDIOLITE
30.0000 | Freq: Once | INTRAVENOUS | Status: AC | PRN
  Administered 2012-01-29: 30 via INTRAVENOUS

## 2012-01-29 MED ORDER — ADENOSINE (DIAGNOSTIC) 3 MG/ML IV SOLN
0.5600 mg/kg | Freq: Once | INTRAVENOUS | Status: AC
  Administered 2012-01-29: 52.8 mg via INTRAVENOUS

## 2012-01-29 NOTE — Progress Notes (Signed)
[facility] SITE 3 NUCLEAR MED [id number] [place] [address] [phone]  Cardiology Nuclear Med Study  Trevor Simpson is a 78 y.o. male     MRN : [id number]     DOB: 21-Apr-1933  Procedure Date: 01/29/2012  Nuclear Med Background Indication for Stress Test:  Evaluation for Ischemia History:  '04 KGM:WNUUVOZD ischemia>Cath:Chronic occluded RCA with moderate stenosis in prox. LAD,  EF=60%; '11 Echo:EF=60%; '11 PTVP for TBS Cardiac Risk Factors: Carotid Disease, Family History - CAD, History of Smoking, Hypertension and PVD  Symptoms:  Chest Pain/Pressure.  (last episode of chest discomfort was last month, relieved with NTG x 2), Palpitations and Rapid HR   Nuclear Pre-Procedure Caffeine/Decaff Intake:  None > 12 hrs NPO After: 8:30pm   Lungs:  Clear. O2 Sat: 98% on room air. IV 0.9% NS with Angio Cath:  22g  IV Site: R Hand x 1, tolerated well IV Started by:  Irean Hong, RN  Chest Size (in):  42 Cup Size: n/a  Height: 6' (1.829 m)  Weight:  208 lb (94.348 kg)  BMI:  Body mass index is 28.21 kg/(m^2). Tech Comments:  No lopressor today    Nuclear Med Study 1 or 2 day study: 1 day  Stress Test Type:  Adenosine  Reading MD: Willa Rough, MD  Order Authorizing Provider:  Lewayne Bunting, MD  Resting Radionuclide: Technetium 43m Sestamibi  Resting Radionuclide Dose: 11.0 mCi   Stress Radionuclide:  Technetium 25m Sestamibi  Stress Radionuclide Dose: 33.0 mCi           Stress Protocol Rest HR: 81 Stress HR: 61  Rest BP: 141/95 Stress BP: 147/74  Exercise Time (min): n/a METS: n/a   Predicted Max HR: 144 bpm % Max HR: 42.36 bpm Rate Pressure Product: 8967   Dose of Adenosine (mg):  52.9 Dose of Lexiscan: n/a mg  Dose of Atropine (mg): n/a Dose of Dobutamine: n/a mcg/kg/min (at max HR)  Stress Test Technologist: Smiley Houseman, CMA-N  Nuclear Technologist:  Domenic Polite, CNMT     Rest Procedure:  Myocardial perfusion imaging was  performed at rest 45 minutes following the intravenous administration of Technetium 1m Sestamibi.  Rest ECG: New nonspecific ST-T wave changes, atrial paced with occasional PVC's.  Stress Procedure:  The patient received IV adenosine at 140 mcg/kg/min for 4 minutes.  There were no significant changes with infusion, frequent ectopy.  Technetium 77m Tetrofosmin was injected at the 2 minute mark and quantitative spect images were obtained after a 45 minute delay.  Stress ECG: No significant ST segment change suggestive of ischemia.  QPS Raw Data Images:  Patient motion noted; appropriate software correction applied. Stress Images:  Normal homogeneous uptake in all areas of the myocardium. Rest Images:  Normal homogeneous uptake in all areas of the myocardium. Subtraction (SDS):  No evidence of ischemia. Transient Ischemic Dilatation (Normal <1.22):  1.05 Lung/Heart Ratio (Normal <0.45):  0.26  Quantitative Gated Spect Images QGS EDV:  n/a QGS ESV:  n/a  Impression Exercise Capacity:  Adenosine study with no exercise. BP Response:  Normal blood pressure response. Clinical Symptoms:  flushed ECG Impression:  No significant ST segment change suggestive of ischemia. Comparison with Prior Nuclear Study: No images to compare  Overall Impression:  Normal stress nuclear study.  LV Ejection Fraction: Study not gated.  LV Wall Motion:  Study not gated.  Willa Rough, MD

## 2012-02-08 ENCOUNTER — Telehealth: Payer: Self-pay | Admitting: Internal Medicine

## 2012-02-08 NOTE — Telephone Encounter (Signed)
Pt would like myoview results °

## 2012-02-08 NOTE — Telephone Encounter (Signed)
Called patient and gave him results.

## 2012-03-19 DIAGNOSIS — J189 Pneumonia, unspecified organism: Secondary | ICD-10-CM

## 2012-03-19 DIAGNOSIS — I639 Cerebral infarction, unspecified: Secondary | ICD-10-CM

## 2012-03-19 HISTORY — DX: Cerebral infarction, unspecified: I63.9

## 2012-03-19 HISTORY — DX: Pneumonia, unspecified organism: J18.9

## 2012-04-21 ENCOUNTER — Encounter: Payer: Medicare Other | Admitting: *Deleted

## 2012-04-23 ENCOUNTER — Encounter: Payer: Self-pay | Admitting: *Deleted

## 2012-07-09 ENCOUNTER — Encounter: Payer: Self-pay | Admitting: Internal Medicine

## 2012-07-09 ENCOUNTER — Other Ambulatory Visit: Payer: Self-pay | Admitting: Internal Medicine

## 2012-07-14 ENCOUNTER — Telehealth: Payer: Self-pay | Admitting: Internal Medicine

## 2012-07-14 NOTE — Telephone Encounter (Signed)
New Prob     Pt is confused about next pacer check. I told pt recall is for October but she still wanted to speak to the nurse about it. Please call.

## 2012-07-14 NOTE — Telephone Encounter (Signed)
Should have had a Merlin 04/21/12 and ROV 12/2012 Tried to call ---NA

## 2012-07-15 NOTE — Telephone Encounter (Signed)
Spoke with patient, he is going to send a transmission today.  Will forward to [employer] to follow up and get him back in regular schedules for remote transmissions

## 2012-07-16 ENCOUNTER — Ambulatory Visit (INDEPENDENT_AMBULATORY_CARE_PROVIDER_SITE_OTHER): Payer: Medicare Other | Admitting: *Deleted

## 2012-07-16 DIAGNOSIS — I4891 Unspecified atrial fibrillation: Secondary | ICD-10-CM

## 2012-07-16 DIAGNOSIS — Z950 Presence of cardiac pacemaker: Secondary | ICD-10-CM

## 2012-07-18 LAB — REMOTE PACEMAKER DEVICE
AL AMPLITUDE: 4.2 mv
BAMS-0001: 150 {beats}/min
BATTERY VOLTAGE: 2.96 V
RV LEAD AMPLITUDE: 12 mv
RV LEAD IMPEDENCE PM: 480 Ohm
RV LEAD THRESHOLD: 0.75 V

## 2012-08-07 ENCOUNTER — Encounter: Payer: Self-pay | Admitting: *Deleted

## 2012-08-17 HISTORY — PX: CORONARY ANGIOPLASTY WITH STENT PLACEMENT: SHX49

## 2012-08-25 ENCOUNTER — Telehealth: Payer: Self-pay | Admitting: Internal Medicine

## 2012-08-25 NOTE — Telephone Encounter (Signed)
New problem   Pt's wife stated pt is having trouble walking and his BP was high last night. Please call pt

## 2012-08-26 NOTE — Telephone Encounter (Signed)
Spoke with patients wife yesterday.  I am not sure the cuff they are using is correct and I have asked them to bring it in to the office the next time they come to have it calibrated.  She will do that.  She says his BP was 130/101-120.  His pressure is fine today,but he just felt he could not breath last night.  I let her know I would discuss with Dr Ladona Ridgel today and call her back

## 2012-08-28 NOTE — Telephone Encounter (Signed)
We received his transmission and we are going to have him come in at 2pm tomorrow to try and reprogram his device

## 2012-08-29 ENCOUNTER — Ambulatory Visit (INDEPENDENT_AMBULATORY_CARE_PROVIDER_SITE_OTHER): Payer: Medicare Other | Admitting: *Deleted

## 2012-08-29 DIAGNOSIS — I495 Sick sinus syndrome: Secondary | ICD-10-CM

## 2012-08-29 LAB — PACEMAKER DEVICE OBSERVATION
AL AMPLITUDE: 3.5 mv
ATRIAL PACING PM: 44
BAMS-0001: 190 {beats}/min
BAMS-0003: 70 {beats}/min
BATTERY VOLTAGE: 2.9478 V
RV LEAD THRESHOLD: 0.875 V
VENTRICULAR PACING PM: 7.4

## 2012-08-29 NOTE — Progress Notes (Signed)
PPM check in office with industry.

## 2012-09-12 ENCOUNTER — Emergency Department (HOSPITAL_COMMUNITY): Payer: Medicare Other

## 2012-09-12 ENCOUNTER — Encounter (HOSPITAL_COMMUNITY): Payer: Self-pay | Admitting: Family Medicine

## 2012-09-12 ENCOUNTER — Inpatient Hospital Stay (HOSPITAL_COMMUNITY)
Admission: EM | Admit: 2012-09-12 | Discharge: 2012-09-18 | DRG: 249 | Disposition: A | Discharge status: Home / Self Care | Payer: Medicare Other | Attending: Cardiology | Admitting: Cardiology

## 2012-09-12 DIAGNOSIS — I5041 Acute combined systolic (congestive) and diastolic (congestive) heart failure: Secondary | ICD-10-CM

## 2012-09-12 DIAGNOSIS — Z9861 Coronary angioplasty status: Secondary | ICD-10-CM

## 2012-09-12 DIAGNOSIS — E785 Hyperlipidemia, unspecified: Secondary | ICD-10-CM | POA: Diagnosis present

## 2012-09-12 DIAGNOSIS — Z79899 Other long term (current) drug therapy: Secondary | ICD-10-CM

## 2012-09-12 DIAGNOSIS — I5021 Acute systolic (congestive) heart failure: Secondary | ICD-10-CM | POA: Diagnosis present

## 2012-09-12 DIAGNOSIS — I5032 Chronic diastolic (congestive) heart failure: Secondary | ICD-10-CM

## 2012-09-12 DIAGNOSIS — I495 Sick sinus syndrome: Secondary | ICD-10-CM | POA: Diagnosis present

## 2012-09-12 DIAGNOSIS — I200 Unstable angina: Secondary | ICD-10-CM | POA: Diagnosis present

## 2012-09-12 DIAGNOSIS — Z955 Presence of coronary angioplasty implant and graft: Secondary | ICD-10-CM

## 2012-09-12 DIAGNOSIS — E871 Hypo-osmolality and hyponatremia: Secondary | ICD-10-CM | POA: Diagnosis present

## 2012-09-12 DIAGNOSIS — I10 Essential (primary) hypertension: Secondary | ICD-10-CM

## 2012-09-12 DIAGNOSIS — Z950 Presence of cardiac pacemaker: Secondary | ICD-10-CM

## 2012-09-12 DIAGNOSIS — I079 Rheumatic tricuspid valve disease, unspecified: Secondary | ICD-10-CM | POA: Diagnosis present

## 2012-09-12 DIAGNOSIS — I129 Hypertensive chronic kidney disease with stage 1 through stage 4 chronic kidney disease, or unspecified chronic kidney disease: Secondary | ICD-10-CM | POA: Diagnosis present

## 2012-09-12 DIAGNOSIS — I2510 Atherosclerotic heart disease of native coronary artery without angina pectoris: Secondary | ICD-10-CM | POA: Diagnosis present

## 2012-09-12 DIAGNOSIS — I4891 Unspecified atrial fibrillation: Secondary | ICD-10-CM | POA: Diagnosis present

## 2012-09-12 DIAGNOSIS — I509 Heart failure, unspecified: Secondary | ICD-10-CM | POA: Diagnosis present

## 2012-09-12 DIAGNOSIS — I2582 Chronic total occlusion of coronary artery: Secondary | ICD-10-CM | POA: Diagnosis present

## 2012-09-12 DIAGNOSIS — I714 Abdominal aortic aneurysm, without rupture, unspecified: Secondary | ICD-10-CM | POA: Diagnosis present

## 2012-09-12 DIAGNOSIS — I739 Peripheral vascular disease, unspecified: Secondary | ICD-10-CM | POA: Diagnosis present

## 2012-09-12 DIAGNOSIS — I5043 Acute on chronic combined systolic (congestive) and diastolic (congestive) heart failure: Principal | ICD-10-CM | POA: Diagnosis present

## 2012-09-12 DIAGNOSIS — I2589 Other forms of chronic ischemic heart disease: Secondary | ICD-10-CM | POA: Diagnosis present

## 2012-09-12 DIAGNOSIS — N183 Chronic kidney disease, stage 3 unspecified: Secondary | ICD-10-CM | POA: Diagnosis present

## 2012-09-12 DIAGNOSIS — I059 Rheumatic mitral valve disease, unspecified: Secondary | ICD-10-CM | POA: Diagnosis present

## 2012-09-12 DIAGNOSIS — I429 Cardiomyopathy, unspecified: Secondary | ICD-10-CM

## 2012-09-12 DIAGNOSIS — I255 Ischemic cardiomyopathy: Secondary | ICD-10-CM | POA: Diagnosis present

## 2012-09-12 HISTORY — DX: Unspecified systolic (congestive) heart failure: I50.20

## 2012-09-12 HISTORY — DX: Essential (primary) hypertension: I10

## 2012-09-12 LAB — BASIC METABOLIC PANEL
CO2: 25 mEq/L (ref 19–32)
Calcium: 9.6 mg/dL (ref 8.4–10.5)
Chloride: 95 mEq/L — ABNORMAL LOW (ref 96–112)
GFR calc Af Amer: 54 mL/min — ABNORMAL LOW (ref >90)
Sodium: 129 mEq/L — ABNORMAL LOW (ref 135–145)

## 2012-09-12 LAB — CBC
Platelets: 163 10*3/uL (ref 150–400)
RBC: 4.58 MIL/uL (ref 4.22–5.81)
RDW: 13.7 % (ref 11.5–15.5)
WBC: 6.7 10*3/uL (ref 4.0–10.5)

## 2012-09-12 LAB — POCT I-STAT TROPONIN I: Troponin i, poc: 0.01 ng/mL (ref 0.00–0.08)

## 2012-09-12 LAB — PRO B NATRIURETIC PEPTIDE: Pro B Natriuretic peptide (BNP): 4730 pg/mL — ABNORMAL HIGH (ref 0–450)

## 2012-09-12 MED ORDER — VITAMIN D3 25 MCG (1000 UNIT) PO TABS
1000.0000 [IU] | ORAL_TABLET | Freq: Every day | ORAL | Status: DC
  Administered 2012-09-13 – 2012-09-18 (×6): 1000 [IU] via ORAL
  Filled 2012-09-12 (×6): qty 1

## 2012-09-12 MED ORDER — FUROSEMIDE 40 MG PO TABS
40.0000 mg | ORAL_TABLET | Freq: Two times a day (BID) | ORAL | Status: AC
  Administered 2012-09-13 – 2012-09-14 (×4): 40 mg via ORAL
  Filled 2012-09-12 (×5): qty 1

## 2012-09-12 MED ORDER — PANTOPRAZOLE SODIUM 40 MG PO TBEC
40.0000 mg | DELAYED_RELEASE_TABLET | Freq: Every day | ORAL | Status: DC
  Administered 2012-09-13 – 2012-09-18 (×6): 40 mg via ORAL
  Filled 2012-09-12 (×6): qty 1

## 2012-09-12 MED ORDER — VITAMIN C 500 MG PO TABS
500.0000 mg | ORAL_TABLET | Freq: Every day | ORAL | Status: DC
  Administered 2012-09-13 – 2012-09-18 (×6): 500 mg via ORAL
  Filled 2012-09-12 (×6): qty 1

## 2012-09-12 MED ORDER — ATORVASTATIN CALCIUM 20 MG PO TABS
20.0000 mg | ORAL_TABLET | Freq: Every day | ORAL | Status: DC
  Administered 2012-09-13 – 2012-09-17 (×5): 20 mg via ORAL
  Filled 2012-09-12 (×7): qty 1

## 2012-09-12 MED ORDER — OMEGA-3-ACID ETHYL ESTERS 1 G PO CAPS
1.0000 g | ORAL_CAPSULE | Freq: Two times a day (BID) | ORAL | Status: DC
  Administered 2012-09-12 – 2012-09-18 (×12): 1 g via ORAL
  Filled 2012-09-12 (×14): qty 1

## 2012-09-12 MED ORDER — SODIUM CHLORIDE 0.9 % IJ SOLN: 3.0000 mL | INTRAMUSCULAR | Status: DC | PRN

## 2012-09-12 MED ORDER — CALCIUM CARBONATE 1250 (500 CA) MG PO TABS
1.0000 | ORAL_TABLET | Freq: Two times a day (BID) | ORAL | Status: DC
  Administered 2012-09-13 – 2012-09-18 (×11): 500 mg via ORAL
  Filled 2012-09-12 (×16): qty 1

## 2012-09-12 MED ORDER — NITROGLYCERIN 0.4 MG SL SUBL: 0.4000 mg | SUBLINGUAL_TABLET | SUBLINGUAL | Status: DC | PRN

## 2012-09-12 MED ORDER — RIVAROXABAN 20 MG PO TABS
20.0000 mg | ORAL_TABLET | Freq: Every evening | ORAL | Status: DC
  Administered 2012-09-12 – 2012-09-13 (×2): 20 mg via ORAL
  Filled 2012-09-12 (×3): qty 1

## 2012-09-12 MED ORDER — FUROSEMIDE 10 MG/ML IJ SOLN
40.0000 mg | Freq: Once | INTRAMUSCULAR | Status: AC
  Administered 2012-09-12: 40 mg via INTRAVENOUS
  Filled 2012-09-12: qty 4

## 2012-09-12 MED ORDER — LEVOTHYROXINE SODIUM 112 MCG PO TABS
112.0000 ug | ORAL_TABLET | Freq: Every day | ORAL | Status: DC
  Administered 2012-09-13 – 2012-09-18 (×6): 112 ug via ORAL
  Filled 2012-09-12 (×10): qty 1

## 2012-09-12 MED ORDER — ONDANSETRON HCL 4 MG/2ML IJ SOLN: 4.0000 mg | Freq: Four times a day (QID) | INTRAMUSCULAR | Status: DC | PRN

## 2012-09-12 MED ORDER — METOPROLOL TARTRATE 25 MG PO TABS
25.0000 mg | ORAL_TABLET | Freq: Every day | ORAL | Status: DC | PRN
  Filled 2012-09-12: qty 1

## 2012-09-12 MED ORDER — IRBESARTAN 300 MG PO TABS
300.0000 mg | ORAL_TABLET | Freq: Every day | ORAL | Status: DC
  Filled 2012-09-12: qty 1

## 2012-09-12 MED ORDER — OLMESARTAN MEDOXOMIL-HCTZ 40-12.5 MG PO TABS: 1.0000 | ORAL_TABLET | Freq: Every day | ORAL | Status: DC

## 2012-09-12 MED ORDER — CALCIUM CARBONATE 600 MG PO TABS
600.0000 mg | ORAL_TABLET | Freq: Two times a day (BID) | ORAL | Status: DC
  Filled 2012-09-12: qty 1

## 2012-09-12 MED ORDER — SODIUM CHLORIDE 0.9 % IJ SOLN
3.0000 mL | Freq: Two times a day (BID) | INTRAMUSCULAR | Status: DC
  Administered 2012-09-12 – 2012-09-15 (×6): 3 mL via INTRAVENOUS

## 2012-09-12 MED ORDER — LORATADINE 10 MG PO TABS
10.0000 mg | ORAL_TABLET | Freq: Every day | ORAL | Status: DC
  Administered 2012-09-13 – 2012-09-18 (×6): 10 mg via ORAL
  Filled 2012-09-12 (×7): qty 1

## 2012-09-12 MED ORDER — ACETAMINOPHEN 325 MG PO TABS: 650.0000 mg | ORAL_TABLET | ORAL | Status: DC | PRN

## 2012-09-12 MED ORDER — MAGNESIUM OXIDE 400 (241.3 MG) MG PO TABS
400.0000 mg | ORAL_TABLET | Freq: Every day | ORAL | Status: DC
  Administered 2012-09-13 – 2012-09-18 (×6): 400 mg via ORAL
  Filled 2012-09-12 (×6): qty 1

## 2012-09-12 MED ORDER — DILTIAZEM HCL ER 120 MG PO CP24
120.0000 mg | ORAL_CAPSULE | Freq: Every day | ORAL | Status: DC
  Filled 2012-09-12: qty 1

## 2012-09-12 MED ORDER — MAGNESIUM GLUCONATE 500 MG PO TABS: 500.0000 mg | ORAL_TABLET | Freq: Every day | ORAL | Status: DC

## 2012-09-12 MED ORDER — OMEGA-3 FATTY ACIDS 1000 MG PO CAPS: 1.0000 g | ORAL_CAPSULE | Freq: Two times a day (BID) | ORAL | Status: DC

## 2012-09-12 MED ORDER — HYDROCHLOROTHIAZIDE 12.5 MG PO CAPS
12.5000 mg | ORAL_CAPSULE | Freq: Every day | ORAL | Status: DC
  Filled 2012-09-12: qty 1

## 2012-09-12 MED ORDER — CHOLECALCIFEROL 25 MCG (1000 UT) PO CAPS: 1000.0000 [IU] | ORAL_CAPSULE | Freq: Every day | ORAL | Status: DC

## 2012-09-12 MED ORDER — SODIUM CHLORIDE 0.9 % IV SOLN: 250.0000 mL | INTRAVENOUS | Status: DC | PRN

## 2012-09-12 NOTE — ED Notes (Signed)
Dr. Plunkett at bedside.  

## 2012-09-12 NOTE — ED Provider Notes (Signed)
History    CSN: [id number] Arrival date & time 09/12/12  1814  First MD Initiated Contact with Patient 09/12/12 1956     Chief Complaint  Patient presents with  . Shortness of Breath  . Chest Pain   (Consider location/radiation/quality/duration/timing/severity/associated sxs/prior Treatment) Patient is a 79 y.o. male presenting with shortness of breath and chest pain. The history is provided by the patient.  Shortness of Breath Severity:  Severe Onset quality:  Gradual Duration:  1 month (But worse over the last 3 days) Timing:  Constant Progression:  Worsening Chronicity:  New Context: activity   Relieved by:  Rest and sitting up Worsened by:  Exertion (lying down) Ineffective treatments:  None tried Associated symptoms: chest pain   Associated symptoms: no abdominal pain, no vomiting and no wheezing   Associated symptoms comment:  Abdominal distention and leg swelling.  Feels weak and tired all the time.  Some chest tightness with walking when he becomes SOB Risk factors: no prolonged immobilization, no recent surgery and no tobacco use   Chest Pain Associated symptoms: shortness of breath   Associated symptoms: no abdominal pain and not vomiting    Past Medical History  Diagnosis Date  . Coronary artery disease   . Other and unspecified hyperlipidemia   . Unspecified essential hypertension   . Atrial fibrillation   . Sinoatrial node dysfunction   . PVD (peripheral vascular disease)   . Pacemaker   . AAA (abdominal aortic aneurysm)    Past Surgical History  Procedure Laterality Date  . Inguinal hernia repair      left  . Cardiac pacemaker placement  08/01/2009    st jude dual chamber  . Colonoscopy    . Esophagogastroduodenoscopy    . Translumminal angioplasty     History reviewed. No pertinent family history. History  Substance Use Topics  . Smoking status: Former Games developer  . Smokeless tobacco: Former Neurosurgeon    Types: Chew  . Alcohol Use: No    Review  of Systems  Respiratory: Positive for shortness of breath. Negative for wheezing.   Cardiovascular: Positive for chest pain.  Gastrointestinal: Negative for vomiting and abdominal pain.  All other systems reviewed and are negative.    Allergies  Codeine and Morphine  Home Medications  No current outpatient prescriptions on file. BP 124/70  Pulse 52  Temp(Src) 98.2 F (36.8 C) (Oral)  Resp 22  SpO2 100% Physical Exam  Nursing note and vitals reviewed. Constitutional: He is oriented to person, place, and time. He appears well-developed and well-nourished. No distress.  HENT:  Head: Normocephalic and atraumatic.  Mouth/Throat: Oropharynx is clear and moist.  Eyes: Conjunctivae and EOM are normal. Pupils are equal, round, and reactive to light.  Neck: Normal range of motion. Neck supple. JVD present.  Cardiovascular: Normal rate and intact distal pulses.  An irregular rhythm present.  No murmur heard. Pulmonary/Chest: Effort normal and breath sounds normal. No respiratory distress. He has no wheezes. He has no rales.  Abdominal: Soft. He exhibits distension. There is no tenderness. There is no rebound and no guarding.  Musculoskeletal: Normal range of motion. He exhibits edema. He exhibits no tenderness.  1+ edema to the midshin bilaterally. Feet are cool but pulses palpable bilateral  Neurological: He is alert and oriented to person, place, and time.  Skin: Skin is warm and dry. No rash noted. No erythema.  Psychiatric: He has a normal mood and affect. His behavior is normal.    ED Course  Procedures (including critical care time) Labs Reviewed  BASIC METABOLIC PANEL - Abnormal; Notable for the following:    Sodium 129 (*)    Chloride 95 (*)    Glucose, Bld 128 (*)    BUN 30 (*)    Creatinine, Ser 1.41 (*)    GFR calc non Af Amer 47 (*)    GFR calc Af Amer 54 (*)    All other components within normal limits  PRO B NATRIURETIC PEPTIDE - Abnormal; Notable for the  following:    Pro B Natriuretic peptide (BNP) 4730.0 (*)    All other components within normal limits  CBC  BASIC METABOLIC PANEL  POCT I-STAT TROPONIN I   Dg Chest Port 1 View  09/12/2012   *RADIOLOGY REPORT*  Clinical Data: Chest pain.  Shortness of breath.  Former smoker with current history of COPD.  PORTABLE CHEST - 1 VIEW 09/12/2012 1949 hours:  Comparison: CT chest 09/12/2010, 12/13/2009.  Two-view chest x-ray 09/22/2009.  Findings: Suboptimal inspiration.  Cardiac silhouette enlarged but stable, allowing for differences in technique and degree of inspiration.  Linear scarring in the right lung base with superimposed mild atelectasis.  Lungs otherwise clear.  Pulmonary vascularity normal without evidence of pulmonary edema.  Left subclavian dual lead transvenous pacemaker unchanged.  Left costophrenic angle excluded from the image.  IMPRESSION: Suboptimal inspiration.  Scarring at the right lung base and associated mild right basilar atelectasis.  No acute cardiopulmonary disease otherwise.  Stable cardiomegaly.   Original Report Authenticated By: Hulan Saas, M.D.    Date: 09/12/2012  Rate: 80  Rhythm: indeterminate and paced rhythm  QRS Axis: indeterminate  Intervals: Paced rhythm  ST/T Wave abnormalities: Paced rhythm  Conduction Disutrbances:Paced rhythm  Narrative Interpretation:   Old EKG Reviewed: unchanged   1. CHF (congestive heart failure), acute, combined     MDM   Patient presenting today with findings and history concerning for CHF. Labs indicate congestive heart failure with a BNP of over 4000. Creatinine is stable and patient has no prior history of this. He has not on diuretics at this time. Due to patient's significant shortness of breath with any activity will admit for diuresis. Patient started on IV Lasix.  Gwyneth Sprout, MD 09/12/12 [phone]

## 2012-09-12 NOTE — ED Notes (Addendum)
Pt c/o dyspnea with exertion that has gotten worse over a month. Per Pt's wife pt has been sleeping on the recliner due to inability to breathe. Pt states over the past month he has had some swelling in feet. Pt has had 4-5 bypass surgeries, 2 MIs, and an issue with his lungs. Pt also has a pacemaker.

## 2012-09-12 NOTE — ED Notes (Signed)
sts 3 weeks of increased SOB and tightness in chest. sts has had to sleep propped up. sts very weak and loss of appetite.

## 2012-09-13 DIAGNOSIS — I5032 Chronic diastolic (congestive) heart failure: Secondary | ICD-10-CM

## 2012-09-13 DIAGNOSIS — I495 Sick sinus syndrome: Secondary | ICD-10-CM

## 2012-09-13 LAB — BASIC METABOLIC PANEL
BUN: 33 mg/dL — ABNORMAL HIGH (ref 6–23)
CO2: 26 mEq/L (ref 19–32)
Calcium: 9.5 mg/dL (ref 8.4–10.5)
Creatinine, Ser: 1.48 mg/dL — ABNORMAL HIGH (ref 0.50–1.35)
Glucose, Bld: 88 mg/dL (ref 70–99)

## 2012-09-13 MED ORDER — CARVEDILOL 3.125 MG PO TABS
3.1250 mg | ORAL_TABLET | Freq: Two times a day (BID) | ORAL | Status: DC
  Administered 2012-09-13 – 2012-09-14 (×2): 3.125 mg via ORAL
  Filled 2012-09-13 (×4): qty 1

## 2012-09-13 NOTE — H&P (Signed)
[facility] History and Physical  Trevor Simpson ZOX:[id number] DOB: 07-Feb-1934 DOA: 09/12/2012  Referring physician:  PCP: Pearson Grippe, MD  Specialists:   Chief Complaint:  SOb  HPI: Trevor Simpson is a 79 y.o. male with multiple medical problems who presents to the ED with complaints of worsening SOB over the past 3 days with orthopnea and DOE.  He reports being so SOB that he had to sleep in his recliner last night.  He denies having chest pain or fevers or chills or cough.   He noticed that he also had worsening swelling of his legs into his ABD as well.   He was senn and evaluated in the ED and was found to have a BNP of 4730.   He was administered 40 mg IV lasix in the ED and referred for medical admission.         Review of Systems: The patient denies anorexia, fever, chills, headaches, weight loss,, vision loss, diplopia, dizziness, decreased hearing, rhinitis, hoarseness, chest pain, syncope, dyspnea on exertion, peripheral edema, balance deficits, cough, hemoptysis, abdominal pain, nausea, vomiting, diarrhea, constipation, hematemesis, melena, hematochezia, severe indigestion/heartburn, dysuria, hematuria, incontinence, muscle weakness, suspicious skin lesions, transient blindness, difficulty walking, depression, unusual weight change, abnormal bleeding, enlarged lymph nodes, angioedema, and breast masses.    Past Medical History  Diagnosis Date  . Coronary artery disease   . Other and unspecified hyperlipidemia   . Unspecified essential hypertension   . Atrial fibrillation   . Sinoatrial node dysfunction   . PVD (peripheral vascular disease)   . Pacemaker   . AAA (abdominal aortic aneurysm)     Past Surgical History  Procedure Laterality Date  . Inguinal hernia repair      left  . Cardiac pacemaker placement  08/01/2009    st jude dual chamber  . Colonoscopy    . Esophagogastroduodenoscopy    . Translumminal angioplasty      Prior to Admission medications    Medication Sig Start Date End Date Taking? Authorizing Provider  atorvastatin (LIPITOR) 20 MG tablet Take 20 mg by mouth daily.     Yes Historical Provider, MD  B Complex-C (SUPER B COMPLEX) TABS Take 1 tablet by mouth daily.    Yes Historical Provider, MD  calcium carbonate (OS-CAL) 600 MG TABS Take 600 mg by mouth 2 (two) times daily with a meal.     Yes Historical Provider, MD  cetirizine (ZYRTEC) 10 MG tablet Take 10 mg by mouth daily.  08/24/10  Yes Historical Provider, MD  Cholecalciferol 1000 UNITS capsule Take 1,000 Units by mouth daily.     Yes Historical Provider, MD  diltiazem (DILACOR XR) 120 MG 24 hr capsule Take 120 mg by mouth daily.     Yes Historical Provider, MD  diphenhydramine-acetaminophen (TYLENOL PM) 25-500 MG TABS Take 1-1.5 tablets by mouth at bedtime as needed (sleep).   Yes Historical Provider, MD  fish oil-omega-3 fatty acids 1000 MG capsule Take 1 g by mouth 2 (two) times daily.    Yes Historical Provider, MD  levothyroxine (SYNTHROID, LEVOTHROID) 112 MCG tablet Take 112 mcg by mouth daily.   Yes Historical Provider, MD  magnesium gluconate (MAGONATE) 500 MG tablet Take 500 mg by mouth daily.    Yes Historical Provider, MD  metoprolol tartrate (LOPRESSOR) 25 MG tablet Take 25 mg by mouth daily as needed (palpatations).   Yes Historical Provider, MD  Multiple Vitamin (MULTIVITAMIN) capsule Take 1 capsule by mouth daily.     Yes  Historical Provider, MD  nitroGLYCERIN (NITROSTAT) 0.4 MG SL tablet Place 1 tablet (0.4 mg total) under the tongue every 5 (five) minutes as needed for chest pain. 01/15/12  Yes Marinus Maw, MD  olmesartan-hydrochlorothiazide (BENICAR HCT) 40-12.5 MG per tablet Take 1 tablet by mouth daily.     Yes Historical Provider, MD  omeprazole (PRILOSEC) 20 MG capsule Take 20 mg by mouth daily as needed (indigestion).    Yes Historical Provider, MD  Rivaroxaban (XARELTO) 20 MG TABS Take 20 mg by mouth every evening.   Yes Historical Provider, MD  Saw  Palmetto, Serenoa repens, (SAW PALMETTO PO) Take 1 capsule by mouth 2 (two) times daily.   Yes Historical Provider, MD  vitamin C (ASCORBIC ACID) 500 MG tablet Take 500 mg by mouth daily.     Yes Historical Provider, MD    Allergies  Allergen Reactions  . Codeine     "tripped out on it"  . Morphine     "tripped out on it"    Social History:  reports that he has quit smoking. He has quit using smokeless tobacco. His smokeless tobacco use included Chew. He reports that he does not drink alcohol or use illicit drugs.     Family History  Problem Relation Age of Onset  . CAD Father   . CAD Brother   . Hypertension Father   . Hypertension Brother   . Diabetes Brother   . Lung cancer Sister    (be sure to complete)   Physical Exam:  GEN:  Pleasant well nourished well developed Elderly African American 79 y.o. male  examined  and in no acute distress; cooperative with exam Filed Vitals:   09/12/12 2115 09/12/12 2130 09/12/12 2300 09/13/12 0445  BP: 119/72 124/70 101/70 99/62  Pulse: 87 52 92 67  Temp:   98.1 F (36.7 C) 97.7 F (36.5 C)  TempSrc:   Oral Oral  Resp: 27 22 17 18   Height:   6' (1.829 m)   Weight:   95.89 kg (211 lb 6.4 oz)   SpO2: 100% 100% 93% 99%   Blood pressure 99/62, pulse 67, temperature 97.7 F (36.5 C), temperature source Oral, resp. rate 18, height 6' (1.829 m), weight 95.89 kg (211 lb 6.4 oz), SpO2 99.00%. PSYCH: He is alert and oriented x4; does not appear anxious does not appear depressed; affect is normal HEENT: Normocephalic and Atraumatic, Mucous membranes pink; PERRLA; EOM intact; Fundi:  Benign;  No scleral icterus, Nares: Patent, Oropharynx: Clear, Edentulous,  Neck:  FROM, no cervical lymphadenopathy nor thyromegaly or carotid bruit; no JVD; Breasts:: Not examined CHEST WALL: No tenderness CHEST: Normal respiration, clear to auscultation bilaterally HEART: Regular rate and rhythm; no murmurs rubs or gallops BACK: No kyphosis or scoliosis; no  CVA tenderness ABDOMEN: Positive Bowel Sounds, soft non-tender; no masses, no organomegaly.  Rectal Exam: Not done EXTREMITIES: No cyanosis, clubbing or edema; no ulcerations. Genitalia: not examined PULSES: 2+ and symmetric SKIN: Normal hydration no rash or ulceration CNS: Cranial nerves 2-12 grossly intact no focal neurologic deficit    Labs on Admission:  Basic Metabolic Panel:  Recent Labs Lab 09/12/12 1834 09/13/12 0435  NA 129* 133*  K 4.6 4.1  CL 95* 98  CO2 25 26  GLUCOSE 128* 88  BUN 30* 33*  CREATININE 1.41* 1.48*  CALCIUM 9.6 9.5   Liver Function Tests: No results found for this basename: AST, ALT, ALKPHOS, BILITOT, PROT, ALBUMIN,  in the last 168 hours No results  found for this basename: LIPASE, AMYLASE,  in the last 168 hours No results found for this basename: AMMONIA,  in the last 168 hours CBC:  Recent Labs Lab 09/12/12 1834  WBC 6.7  HGB 14.3  HCT 43.1  MCV 94.1  PLT 163   Cardiac Enzymes: No results found for this basename: CKTOTAL, CKMB, CKMBINDEX, TROPONINI,  in the last 168 hours  BNP (last 3 results)  Recent Labs  09/12/12 1834  PROBNP 4730.0*   CBG: No results found for this basename: GLUCAP,  in the last 168 hours  Radiological Exams on Admission: Dg Chest Port 1 View  09/12/2012   *RADIOLOGY REPORT*  Clinical Data: Chest pain.  Shortness of breath.  Former smoker with current history of COPD.  PORTABLE CHEST - 1 VIEW 09/12/2012 1949 hours:  Comparison: CT chest 09/12/2010, 12/13/2009.  Two-view chest x-ray 09/22/2009.  Findings: Suboptimal inspiration.  Cardiac silhouette enlarged but stable, allowing for differences in technique and degree of inspiration.  Linear scarring in the right lung base with superimposed mild atelectasis.  Lungs otherwise clear.  Pulmonary vascularity normal without evidence of pulmonary edema.  Left subclavian dual lead transvenous pacemaker unchanged.  Left costophrenic angle excluded from the image.   IMPRESSION: Suboptimal inspiration.  Scarring at the right lung base and associated mild right basilar atelectasis.  No acute cardiopulmonary disease otherwise.  Stable cardiomegaly.   Original Report Authenticated By: Hulan Saas, M.D.     EKG:    Assessment/Plan Principal Problem:   CHF (congestive heart failure) Active Problems:   HYPERTENSION   CAD   ATRIAL FIBRILLATION, PAROXYSMAL   Hyponatremia   Other and unspecified hyperlipidemia     1.   Acute CHF- Probable Diastolic CHF,  CHF Protocol ordered, Diurese with IV Lasix, monitor and supplement K+ as needed.   2D ECHO ordered The current medical regimen is effective;  Continue Benicar/HCTZ, and Metoprolol Rx.     2.   HTN-  Continue Diltiazem, Metoprolol, and Benicar Rx.  Also receiving lasix to diurese.   Monitor BPs.    3.   CAD- stable continue on metoprolol Rx and statin Rx.    4.   Atrial fibrillation- stable on meds, continue Xarelto, and diltiazem and metoprolol for rate control.     5.  Hyponatremia- most likely Dilutional due to volume overload, should improve with diuresis, monitor Na+ trend.     6.  Hyperlipidemia-  Continue lipitor, and Omega 3 fatty Acids.      Code Status:   FULL CODE Family Communication:    Wife at Bedside Disposition Plan:     Return to Home on discharge  Time spent: 79 Minutes  Ron Parker [facility] Pager [phone]  If 7PM-7AM, please contact night-coverage [email or web address] Password [facility] 09/13/2012, 6:47 AM

## 2012-09-13 NOTE — Progress Notes (Signed)
[facility] PROGRESS NOTE  Trevor BEHNKEN WGN:[id number] DOB: 03-10-1934 DOA: 09/12/2012 PCP: Pearson Grippe, MD  Brief history 79 year old male with a history of atrial fibrillation on rivaroxaban, coronary artery disease, sick sinus syndrome status post pacemaker, hypertension, and coronary artery disease presents with 3-4 week history of progressive dyspnea on exertion. However over the past 3 days, his dyspnea on exertion has significantly worsened with orthopnea as well as increased lower extremity edema and increasing abdominal girth. The patient also complained of increasing abdominal girth. The patient endorses compliance with his medications. He has some chest tightness which has improved. There is no fevers, chills, nausea, vomiting, diarrhea. The patient was admitted with acute CHF with proBNP of 4730. The patient was given intravenous furosemide in the emergency department with improvement of his symptoms.  Assessment/Plan: Acute on chronic diastolic heart failure -Continue furosemide -Daily weights, I.'s and O.'s -Check TSH -Await echocardiogram Atrial fibrillation -Rate controlled -Continue rivaroxaban Sick sinus syndrome -Status post pacemaker -PPM last interrogated 08/29/2012 Hypertension -Discontinue Avapro and HCTZ as blood pressure is soft -Discontinue diltiazem -Low dose carvedilol Hyponatremia -Improving with diuresis -Likely due to hypervolemia Hyperlipidemia -Continue statin  Family Communication:   Son and wife at beside Disposition Plan:   Home when medically stable          Procedures/Studies: Dg Chest Port 1 View  09/12/2012   *RADIOLOGY REPORT*  Clinical Data: Chest pain.  Shortness of breath.  Former smoker with current history of COPD.  PORTABLE CHEST - 1 VIEW 09/12/2012 1949 hours:  Comparison: CT chest 09/12/2010, 12/13/2009.  Two-view chest x-ray 09/22/2009.  Findings: Suboptimal inspiration.  Cardiac silhouette enlarged but stable,  allowing for differences in technique and degree of inspiration.  Linear scarring in the right lung base with superimposed mild atelectasis.  Lungs otherwise clear.  Pulmonary vascularity normal without evidence of pulmonary edema.  Left subclavian dual lead transvenous pacemaker unchanged.  Left costophrenic angle excluded from the image.  IMPRESSION: Suboptimal inspiration.  Scarring at the right lung base and associated mild right basilar atelectasis.  No acute cardiopulmonary disease otherwise.  Stable cardiomegaly.   Original Report Authenticated By: Hulan Saas, M.D.         Subjective: Patient denies fevers, chills, chest discomfort, nausea, vomiting, diarrhea, abdominal pain. He still has some dyspnea on exertion but this is a little bit better. He denies any dysuria, hematuria, dizziness, syncope.  Objective: Filed Vitals:   09/12/12 2115 09/12/12 2130 09/12/12 2300 09/13/12 0445  BP: 119/72 124/70 101/70 99/62  Pulse: 87 52 92 67  Temp:   98.1 F (36.7 C) 97.7 F (36.5 C)  TempSrc:   Oral Oral  Resp: 27 22 17 18   Height:   6' (1.829 m)   Weight:   95.89 kg (211 lb 6.4 oz)   SpO2: 100% 100% 93% 99%    Intake/Output Summary (Last 24 hours) at 09/13/12 1227 Last data filed at 09/13/12 1052  Gross per 24 hour  Intake    420 ml  Output   1775 ml  Net  -1355 ml   Weight change:  Exam:   General:  Pt is alert, follows commands appropriately, not in acute distress  HEENT: No icterus, No thrush,  [place]/AT  Cardiovascular: RRR, S1/S2, no rubs, no gallops  Respiratory: Diminished breath sounds at the bases. No wheezes. Good air movement.  Abdomen: Soft/+BS, non tender, non distended, no guarding  Extremities: 1+ edema, No lymphangitis, No petechiae, No rashes, no synovitis  Data Reviewed: Basic  Metabolic Panel:  Recent Labs Lab 09/12/12 1834 09/13/12 0435  NA 129* 133*  K 4.6 4.1  CL 95* 98  CO2 25 26  GLUCOSE 128* 88  BUN 30* 33*  CREATININE 1.41* 1.48*   CALCIUM 9.6 9.5   Liver Function Tests: No results found for this basename: AST, ALT, ALKPHOS, BILITOT, PROT, ALBUMIN,  in the last 168 hours No results found for this basename: LIPASE, AMYLASE,  in the last 168 hours No results found for this basename: AMMONIA,  in the last 168 hours CBC:  Recent Labs Lab 09/12/12 1834  WBC 6.7  HGB 14.3  HCT 43.1  MCV 94.1  PLT 163   Cardiac Enzymes: No results found for this basename: CKTOTAL, CKMB, CKMBINDEX, TROPONINI,  in the last 168 hours BNP: No components found with this basename: POCBNP,  CBG: No results found for this basename: GLUCAP,  in the last 168 hours  No results found for this or any previous visit (from the past 240 hour(s)).   Scheduled Meds: . atorvastatin  20 mg Oral q1800  . calcium carbonate  1 tablet Oral BID WC  . carvedilol  3.125 mg Oral BID WC  . cholecalciferol  1,000 Units Oral Daily  . furosemide  40 mg Oral BID  . levothyroxine  112 mcg Oral QAC breakfast  . loratadine  10 mg Oral Daily  . magnesium oxide  400 mg Oral Daily  . omega-3 acid ethyl esters  1 g Oral BID  . pantoprazole  40 mg Oral Daily  . Rivaroxaban  20 mg Oral QPM  . sodium chloride  3 mL Intravenous Q12H  . vitamin C  500 mg Oral Daily   Continuous Infusions:    Melvie Paglia, DO  [facility] Pager [phone]  If 7PM-7AM, please contact night-coverage [email or web address] Password [facility] 09/13/2012, 12:27 PM   LOS: 1 day

## 2012-09-13 NOTE — Progress Notes (Signed)
Discussed with pt and wife to watch HF video and didn't want to at the time. Wanted to wait till daughter visited in the evening will follow up with pt. Instructed pt to call when daughter arrived.

## 2012-09-13 NOTE — Progress Notes (Signed)
Pt admitted to 4743, came by stretcher, wife at the bedside, VSS, denies any pain or discomfort at this time, no distress noticed. Pt and wife oriented to the unit, to his room and encouraged to call for assistance to get OOB to prevent any fall or injury while in the hospital. CHF education started. We'll continue with POC.

## 2012-09-14 ENCOUNTER — Encounter (HOSPITAL_COMMUNITY): Payer: Self-pay | Admitting: Internal Medicine

## 2012-09-14 DIAGNOSIS — I429 Cardiomyopathy, unspecified: Secondary | ICD-10-CM | POA: Insufficient documentation

## 2012-09-14 DIAGNOSIS — I059 Rheumatic mitral valve disease, unspecified: Secondary | ICD-10-CM

## 2012-09-14 DIAGNOSIS — Z950 Presence of cardiac pacemaker: Secondary | ICD-10-CM

## 2012-09-14 DIAGNOSIS — I5020 Unspecified systolic (congestive) heart failure: Secondary | ICD-10-CM | POA: Insufficient documentation

## 2012-09-14 DIAGNOSIS — I255 Ischemic cardiomyopathy: Secondary | ICD-10-CM | POA: Diagnosis present

## 2012-09-14 LAB — BASIC METABOLIC PANEL
Chloride: 94 mEq/L — ABNORMAL LOW (ref 96–112)
GFR calc Af Amer: 58 mL/min — ABNORMAL LOW (ref >90)
GFR calc non Af Amer: 50 mL/min — ABNORMAL LOW (ref >90)
Glucose, Bld: 109 mg/dL — ABNORMAL HIGH (ref 70–99)
Potassium: 3.6 mEq/L (ref 3.5–5.1)
Sodium: 133 mEq/L — ABNORMAL LOW (ref 135–145)

## 2012-09-14 MED ORDER — DIGOXIN 250 MCG PO TABS
0.2500 mg | ORAL_TABLET | Freq: Three times a day (TID) | ORAL | Status: AC
  Administered 2012-09-14 – 2012-09-15 (×3): 0.25 mg via ORAL
  Filled 2012-09-14 (×3): qty 1

## 2012-09-14 MED ORDER — DIAZEPAM 5 MG PO TABS
5.0000 mg | ORAL_TABLET | ORAL | Status: AC
  Administered 2012-09-15: 5 mg via ORAL
  Filled 2012-09-14: qty 1

## 2012-09-14 MED ORDER — CARVEDILOL 6.25 MG PO TABS
6.2500 mg | ORAL_TABLET | Freq: Two times a day (BID) | ORAL | Status: DC
  Administered 2012-09-14 – 2012-09-16 (×3): 6.25 mg via ORAL
  Filled 2012-09-14 (×7): qty 1

## 2012-09-14 MED ORDER — SODIUM CHLORIDE 0.9 % IV SOLN
INTRAVENOUS | Status: DC
  Administered 2012-09-15: 05:00:00 via INTRAVENOUS

## 2012-09-14 MED ORDER — SODIUM CHLORIDE 0.9 % IV SOLN: 1.0000 mL/kg/h | INTRAVENOUS | Status: DC

## 2012-09-14 MED ORDER — HEPARIN SODIUM (PORCINE) 5000 UNIT/ML IJ SOLN
5000.0000 [IU] | Freq: Three times a day (TID) | INTRAMUSCULAR | Status: DC
  Administered 2012-09-14 – 2012-09-15 (×3): 5000 [IU] via SUBCUTANEOUS
  Filled 2012-09-14 (×6): qty 1

## 2012-09-14 MED ORDER — DIGOXIN 125 MCG PO TABS
0.1250 mg | ORAL_TABLET | Freq: Every day | ORAL | Status: DC
  Administered 2012-09-16 – 2012-09-18 (×3): 0.125 mg via ORAL
  Filled 2012-09-14 (×3): qty 1

## 2012-09-14 NOTE — Progress Notes (Signed)
[facility] PROGRESS NOTE  Trevor Simpson ZOX:[id number] DOB: [birth year] DOA: 09/12/2012 PCP: Trevor Grippe, MD  Assessment/Plan: Acute on chronic diastolic heart failure  -Continue furosemide  -neg 2705cc for admission/ neg 2.8kg -Check TSH  -09/14/2012 echo EF 25-30% with basal inferior akinesis, grade 2 diastolic dysfunction -06/27/2009 echo EF 55-60%, no WMA -consult cardiology -Discontinue diltiazem in the face of suppressed EF -Continue carvedilol Atrial fibrillation  -Rate controlled  -Continue rivaroxaban  Sick sinus syndrome  -St. Jude dual chamber PPM last interrogated 08/29/2012  Hypertension  -Discontinue Avapro and HCTZ as blood pressure is soft  -Low dose carvedilol  Hyponatremia  -Improving with diuresis  -Likely due to hypervolemia  Hyperlipidemia  -Continue statin   Family Communication:   Wife and son at beside Disposition Plan:   Home when medically stable         Procedures/Studies: Dg Chest Port 1 View  09/12/2012   *RADIOLOGY REPORT*  Clinical Data: Chest pain.  Shortness of breath.  Former smoker with current history of COPD.  PORTABLE CHEST - 1 VIEW 09/12/2012 1949 hours:  Comparison: CT chest 09/12/2010, 12/13/2009.  Two-view chest x-ray 09/22/2009.  Findings: Suboptimal inspiration.  Cardiac silhouette enlarged but stable, allowing for differences in technique and degree of inspiration.  Linear scarring in the right lung base with superimposed mild atelectasis.  Lungs otherwise clear.  Pulmonary vascularity normal without evidence of pulmonary edema.  Left subclavian dual lead transvenous pacemaker unchanged.  Left costophrenic angle excluded from the image.  IMPRESSION: Suboptimal inspiration.  Scarring at the right lung base and associated mild right basilar atelectasis.  No acute cardiopulmonary disease otherwise.  Stable cardiomegaly.   Original Report Authenticated By: Trevor Simpson, M.D.         Subjective: Patient states that  he is breathing better. He still has some dyspnea on exertion. Denies any fevers, chills, chest discomfort, nausea, vomiting, diarrhea. No dizziness or syncope.  Objective: Filed Vitals:   09/13/12 0445 09/13/12 1638 09/13/12 2104 09/14/12 0525  BP: 99/62 104/70 93/72 99/64   Pulse: 67  108 58  Temp: 97.7 F (36.5 C) 97.5 F (36.4 C) 98.5 F (36.9 C) 97.2 F (36.2 C)  TempSrc: Oral Oral Oral Oral  Resp: 18   18  Height:      Weight:    93.078 kg (205 lb 3.2 oz)  SpO2: 99% 100% 100% 100%    Intake/Output Summary (Last 24 hours) at 09/14/12 1427 Last data filed at 09/14/12 0933  Gross per 24 hour  Intake   1040 ml  Output   2625 ml  Net  -1585 ml   Weight change: -2.812 kg (-6 lb 3.2 oz) Exam:   General:  Pt is alert, follows commands appropriately, not in acute distress  HEENT: No icterus, No thrush,  [place]/AT  Cardiovascular:  irregular,  no rubs, no gallops  Respiratory: Clear to auscultation. No wheezes or rhonchi. Good air movement.  Abdomen: Soft/+BS, non tender, non distended, no guarding  Extremities: 2+ edema, No lymphangitis, No petechiae, No rashes, no synovitis  Data Reviewed: Basic Metabolic Panel:  Recent Labs Lab 09/12/12 1834 09/13/12 0435 09/14/12 0520  NA 129* 133* 133*  K 4.6 4.1 3.6  CL 95* 98 94*  CO2 25 26 29   GLUCOSE 128* 88 109*  BUN 30* 33* 34*  CREATININE 1.41* 1.48* 1.33  CALCIUM 9.6 9.5 9.5  MG  --   --  1.9   Liver Function Tests: No results found for this basename: AST, ALT,  ALKPHOS, BILITOT, PROT, ALBUMIN,  in the last 168 hours No results found for this basename: LIPASE, AMYLASE,  in the last 168 hours No results found for this basename: AMMONIA,  in the last 168 hours CBC:  Recent Labs Lab 09/12/12 1834  WBC 6.7  HGB 14.3  HCT 43.1  MCV 94.1  PLT 163   Cardiac Enzymes: No results found for this basename: CKTOTAL, CKMB, CKMBINDEX, TROPONINI,  in the last 168 hours BNP: No components found with this basename:  POCBNP,  CBG: No results found for this basename: GLUCAP,  in the last 168 hours  No results found for this or any previous visit (from the past 240 hour(s)).   Scheduled Meds: . atorvastatin  20 mg Oral q1800  . calcium carbonate  1 tablet Oral BID WC  . carvedilol  3.125 mg Oral BID WC  . cholecalciferol  1,000 Units Oral Daily  . furosemide  40 mg Oral BID  . levothyroxine  112 mcg Oral QAC breakfast  . loratadine  10 mg Oral Daily  . magnesium oxide  400 mg Oral Daily  . omega-3 acid ethyl esters  1 g Oral BID  . pantoprazole  40 mg Oral Daily  . Rivaroxaban  20 mg Oral QPM  . sodium chloride  3 mL Intravenous Q12H  . vitamin C  500 mg Oral Daily   Continuous Infusions:    Trevor Nieblas, DO  [facility] Pager [phone]  If 7PM-7AM, please contact night-coverage [email or web address] Password [facility] 09/14/2012, 2:27 PM   LOS: 2 days

## 2012-09-14 NOTE — Progress Notes (Signed)
  Echocardiogram 2D Echocardiogram has been performed.  Zhoe Catania FRANCES 09/14/2012, 10:50 AM

## 2012-09-14 NOTE — Progress Notes (Signed)
Rounding given, pt resting comfortable on bed.  Pt on 2L O2 for comfort, denies any pain or discomfort at this time, no family present during the night. We'll continue with POC.

## 2012-09-14 NOTE — Consult Note (Signed)
ELECTROPHYSIOLOGY CONSULT NOTE  Patient ID: Trevor Simpson, MRN: [id number], DOB/AGE: 12/08/1933 79 y.o. Admit date: 09/12/2012 Date of Consult: 09/14/2012  Primary Physician: Pearson Grippe, MD Primary Cardiologist: gt  Chief Complaint: new LV dysfunction   HPI Trevor Simpson is a 79 y.o. male with a history of paroxysmal atrial fibrillation and prior pacemaker implantation who presented with shortness of breath orthopnea and edema. He had progressive symptoms for 2-3 weeks. He has had all with chest tightness in the same interval which has both been associated with the shortness of breath and exertion it has also occurred at rest. He also noted that his blood pressure at home has drifted from the 130 range to the 105 range.  He has had palpitations. He has no syncope. His atrial fibrillation dynamics from his pacemaker demonstrate as of April 2014 about 18% atrial fibrillation with a mean rate in the 90s/100 range.    He has known history of vascular disease with prior right renal artery stenting; prior AAA repair. Left heart catheterization 2004 demonstrated? 60% LAD lesion and totally occluded right coronary artery and a patent but small circumflex  He is on low-dose beta blockers; blood pressure is precluded the introduction of ACE inhibitors     Echocardiogram 6/14. demonstrated interval deterioration of LV function with inferior wall motion abnormalities and worsening mitral regurgitation. EF now 25-30% with right ventricular dysfunction. Echo 2011 demonstrated normal LV function   Past Medical History  Diagnosis Date  . Coronary artery disease   . Other and unspecified hyperlipidemia   . Unspecified essential hypertension   . Atrial fibrillation   . Sinoatrial node dysfunction   . PVD (peripheral vascular disease)   . Pacemaker   . AAA (abdominal aortic aneurysm)       Surgical History:  Past Surgical History  Procedure Laterality Date  . Inguinal hernia repair      left  . Cardiac pacemaker placement  08/01/2009    st jude dual chamber  . Colonoscopy    . Esophagogastroduodenoscopy    . Translumminal angioplasty       Home Meds: Prior to Admission medications   Medication Sig Start Date End Date Taking? Authorizing Provider  atorvastatin (LIPITOR) 20 MG tablet Take 20 mg by mouth daily.     Yes Historical Provider, MD  B Complex-C (SUPER B COMPLEX) TABS Take 1 tablet by mouth daily.    Yes Historical Provider, MD  calcium carbonate (OS-CAL) 600 MG TABS Take 600 mg by mouth 2 (two) times daily with a meal.     Yes Historical Provider, MD  cetirizine (ZYRTEC) 10 MG tablet Take 10 mg by mouth daily.  08/24/10  Yes Historical Provider, MD  Cholecalciferol 1000 UNITS capsule Take 1,000 Units by mouth daily.     Yes Historical Provider, MD  diltiazem (DILACOR XR) 120 MG 24 hr capsule Take 120 mg by mouth daily.     Yes Historical Provider, MD  diphenhydramine-acetaminophen (TYLENOL PM) 25-500 MG TABS Take 1-1.5 tablets by mouth at bedtime as needed (sleep).   Yes Historical Provider, MD  fish oil-omega-3 fatty acids 1000 MG capsule Take 1 g by mouth 2 (two) times daily.    Yes Historical Provider, MD  levothyroxine (SYNTHROID, LEVOTHROID) 112 MCG tablet Take 112 mcg by mouth daily.   Yes Historical Provider, MD  magnesium gluconate (MAGONATE) 500 MG tablet Take 500 mg by mouth daily.    Yes Historical Provider, MD  metoprolol tartrate (LOPRESSOR) 25 MG tablet  Take 25 mg by mouth daily as needed (palpatations).   Yes Historical Provider, MD  Multiple Vitamin (MULTIVITAMIN) capsule Take 1 capsule by mouth daily.     Yes Historical Provider, MD  nitroGLYCERIN (NITROSTAT) 0.4 MG SL tablet Place 1 tablet (0.4 mg total) under the tongue every 5 (five) minutes as needed for chest pain. 01/15/12  Yes Marinus Maw, MD  olmesartan-hydrochlorothiazide (BENICAR HCT) 40-12.5 MG per tablet Take 1 tablet by mouth daily.     Yes Historical Provider, MD  omeprazole  (PRILOSEC) 20 MG capsule Take 20 mg by mouth daily as needed (indigestion).    Yes Historical Provider, MD  Rivaroxaban (XARELTO) 20 MG TABS Take 20 mg by mouth every evening.   Yes Historical Provider, MD  Saw Palmetto, Serenoa repens, (SAW PALMETTO PO) Take 1 capsule by mouth 2 (two) times daily.   Yes Historical Provider, MD  vitamin C (ASCORBIC ACID) 500 MG tablet Take 500 mg by mouth daily.     Yes Historical Provider, MD    Inpatient Medications:  . atorvastatin  20 mg Oral q1800  . calcium carbonate  1 tablet Oral BID WC  . carvedilol  3.125 mg Oral BID WC  . cholecalciferol  1,000 Units Oral Daily  . furosemide  40 mg Oral BID  . levothyroxine  112 mcg Oral QAC breakfast  . loratadine  10 mg Oral Daily  . magnesium oxide  400 mg Oral Daily  . omega-3 acid ethyl esters  1 g Oral BID  . pantoprazole  40 mg Oral Daily  . Rivaroxaban  20 mg Oral QPM  . sodium chloride  3 mL Intravenous Q12H  . vitamin C  500 mg Oral Daily      Allergies:  Allergies  Allergen Reactions  . Codeine     "tripped out on it"  . Morphine     "tripped out on it"    History   Social History  . Marital Status: Married    Spouse Name: N/A    Number of Children: N/A  . Years of Education: N/A   Occupational History  . Not on file.   Social History Main Topics  . Smoking status: Former Games developer  . Smokeless tobacco: Former Neurosurgeon    Types: Chew  . Alcohol Use: No  . Drug Use: No  . Sexually Active: Not on file   Other Topics Concern  . Not on file   Social History Narrative  . No narrative on file     Family History  Problem Relation Age of Onset  . CAD Father   . CAD Brother   . Hypertension Father   . Hypertension Brother   . Diabetes Brother   . Lung cancer Sister      ROS:  Please see the history of present illness.   Negative except hard of hearing  All other systems reviewed and negative.    Physical Exam:   Blood pressure 99/63, pulse 105 irreguilar, temperature 97.4  F (36.3 C), temperature source Oral, resp. rate 19, height 6' (1.829 m), weight 205 lb 3.2 oz (93.078 kg), SpO2 99.00%. General: Well developed, well nourished male in no acute distress. Head: Normocephalic, atraumatic, sclera non-icteric, no xanthomas, nares are without discharge. EENT: normal Lymph Nodes:  none Back: without scoliosis/kyphosis , no CVA tendersness Neck: Negative for carotid bruits. JVD 8-10 Lungs: Clear bilaterally to auscultation without wheezes, rales, or rhonchi. Breathing is unlabored. Heart: Irregularly irregular rate and rhythm with a 2/6  systolic murmur ,  Abdomen: Soft, non-tender, non-distended with normoactive bowel sounds. No hepatomegaly. No rebound/guarding. No obvious abdominal masses. Msk:  Strength and tone appear normal for age. Extremities: No clubbing or cyanosis.  2 edema.  Distal pedal pulses are 2+ and equal bilaterally. Skin: Warm and Dry Neuro: Alert and oriented X 3. CN III-XII intact Grossly normal sensory and motor function . Psych:  Responds to questions appropriately with a normal affect.      Labs: Cardiac Enzymes No results found for this basename: CKTOTAL, CKMB, TROPONINI,  in the last 72 hours CBC Lab Results  Component Value Date   WBC 6.7 09/12/2012   HGB 14.3 09/12/2012   HCT 43.1 09/12/2012   MCV 94.1 09/12/2012   PLT 163 09/12/2012   PROTIME: No results found for this basename: LABPROT, INR,  in the last 72 hours Chemistry   Recent Labs Lab 09/14/12 0520  NA 133*  K 3.6  CL 94*  CO2 29  BUN 34*  CREATININE 1.33  CALCIUM 9.5  GLUCOSE 109*   Lipids No results found for this basename: CHOL,  HDL,  LDLCALC,  TRIG   BNP Pro B Natriuretic peptide (BNP)  Date/Time Value Range Status  09/12/2012  6:34 PM 4730.0* 0 - 450 pg/mL Final   Miscellaneous No results found for this basename: DDIMER    Radiology/Studies:  Dg Chest Port 1 View  09/12/2012   *RADIOLOGY REPORT*  Clinical Data: Chest pain.  Shortness of  breath.  Former smoker with current history of COPD.  PORTABLE CHEST - 1 VIEW 09/12/2012 1949 hours:  Comparison: CT chest 09/12/2010, 12/13/2009.  Two-view chest x-ray 09/22/2009.  Findings: Suboptimal inspiration.  Cardiac silhouette enlarged but stable, allowing for differences in technique and degree of inspiration.  Linear scarring in the right lung base with superimposed mild atelectasis.  Lungs otherwise clear.  Pulmonary vascularity normal without evidence of pulmonary edema.  Left subclavian dual lead transvenous pacemaker unchanged.  Left costophrenic angle excluded from the image.  IMPRESSION: Suboptimal inspiration.  Scarring at the right lung base and associated mild right basilar atelectasis.  No acute cardiopulmonary disease otherwise.  Stable cardiomegaly.   Original Report Authenticated By: Hulan Saas, M.D.    EKG: Atrial fibrillation with intermittent ventricular pacing and wide-complex with what I presume a right bundle branch block intrinsic conduction and left bundle branch block ventricular couplets   Assessment and Plan:   Principal Problem:   Acute systolic congestive heart failure Active Problems:   CAD   ATRIAL FIBRILLATION, PAROXYSMAL   BRADYCARDIA-TACHYCARDIA SYNDROME   Hyponatremia   Cardiomyopathy, ischemic  The patient presents with acute systolic heart failure. He is found to have an ejection fraction of 25% with moderate mitral regurgitation and biatrial enlargement which is a significant interval decline since 2011; the time of his last study. He has known coronary artery disease by prior cath 2004. He also has paroxysmal atrial fibrillation with a poorly controlled ventricular rate with a mean rate of about 100 beats a minute. It is not clear, to the last number of weeks he has been in atrial fibrillation as he is now. The fact that there has been interval decrease in his systolic blood pressure suggests that atrial fibrillation he had intervened a few weeks  ago and has now become persistent  Differential diagnosis of a cardiomyopathy that includes progressive ischemic disease, rate related myopathy, etc.   Interrogation of his pacemaker will help eliminate the duration of his atrial fibrillation.  I  would recommend that we undertake cardioversion although prior to that, with his accompanying chest discomfort his known vascular disease and coronary artery disease I think we should undertake catheterization. That is tentatively scheduled for tomorrow. Right and left heart catheterization is appropriate as he has significant tricuspid valve disease as well.  Following catheterization and revascularization if indicated I would resume his Rivaroxaban and then undertake TE guided cardioversion. Right now blood pressure limits afterload reduction will continue his carvedilol and diuresis.  We'll begin digoxin for augmented rate control in the setting of his congestive heart failure   Sherryl Manges

## 2012-09-14 NOTE — Progress Notes (Signed)
Assessment completed, pt's denies any pain or discomfort at this time. Heart Cath video presented to the pt and wife. Pt refuses to see CHF video today. We'll try to present CHF video tomorrow at lunch time. Pt oriented to keep NPO after MN for procedure. We'll continue with POC.

## 2012-09-15 ENCOUNTER — Encounter (HOSPITAL_COMMUNITY)
Admission: EM | Disposition: A | Discharge status: Home / Self Care | Payer: Self-pay | Source: Home / Self Care | Attending: Internal Medicine

## 2012-09-15 DIAGNOSIS — I428 Other cardiomyopathies: Secondary | ICD-10-CM

## 2012-09-15 DIAGNOSIS — I2510 Atherosclerotic heart disease of native coronary artery without angina pectoris: Secondary | ICD-10-CM

## 2012-09-15 HISTORY — PX: PERCUTANEOUS CORONARY STENT INTERVENTION (PCI-S): SHX5485

## 2012-09-15 HISTORY — PX: LEFT AND RIGHT HEART CATHETERIZATION WITH CORONARY ANGIOGRAM: SHX5449

## 2012-09-15 LAB — PROTIME-INR
INR: 1.64 — ABNORMAL HIGH (ref 0.00–1.49)
Prothrombin Time: 19 seconds — ABNORMAL HIGH (ref 11.6–15.2)

## 2012-09-15 LAB — BASIC METABOLIC PANEL
Chloride: 95 mEq/L — ABNORMAL LOW (ref 96–112)
GFR calc Af Amer: 59 mL/min — ABNORMAL LOW (ref >90)
GFR calc non Af Amer: 51 mL/min — ABNORMAL LOW (ref >90)
Potassium: 3.6 mEq/L (ref 3.5–5.1)
Sodium: 133 mEq/L — ABNORMAL LOW (ref 135–145)

## 2012-09-15 LAB — CREATININE, SERUM: Creatinine, Ser: 1.15 mg/dL (ref 0.50–1.35)

## 2012-09-15 LAB — POCT I-STAT 3, ART BLOOD GAS (G3+)
Acid-Base Excess: 2 mmol/L (ref 0.0–2.0)
Bicarbonate: 26.6 mEq/L — ABNORMAL HIGH (ref 20.0–24.0)
pH, Arterial: 7.403 (ref 7.350–7.450)

## 2012-09-15 LAB — POCT I-STAT 3, VENOUS BLOOD GAS (G3P V)
Acid-Base Excess: 1 mmol/L (ref 0.0–2.0)
pCO2, Ven: 48.1 mmHg (ref 45.0–50.0)
pH, Ven: 7.364 — ABNORMAL HIGH (ref 7.250–7.300)
pO2, Ven: 34 mmHg (ref 30.0–45.0)

## 2012-09-15 LAB — CBC
MCH: 31.1 pg (ref 26.0–34.0)
MCV: 94.2 fL (ref 78.0–100.0)
Platelets: 142 10*3/uL — ABNORMAL LOW (ref 150–400)
RBC: 4.31 MIL/uL (ref 4.22–5.81)
RDW: 13.5 % (ref 11.5–15.5)

## 2012-09-15 LAB — TSH: TSH: 4.152 u[IU]/mL (ref 0.350–4.500)

## 2012-09-15 LAB — POCT ACTIVATED CLOTTING TIME: Activated Clotting Time: 503 seconds

## 2012-09-15 SURGERY — LEFT AND RIGHT HEART CATHETERIZATION WITH CORONARY ANGIOGRAM
Anesthesia: LOCAL

## 2012-09-15 MED ORDER — BIVALIRUDIN 250 MG IV SOLR
INTRAVENOUS | Status: AC
  Filled 2012-09-15: qty 250

## 2012-09-15 MED ORDER — HEPARIN SODIUM (PORCINE) 5000 UNIT/ML IJ SOLN
5000.0000 [IU] | Freq: Three times a day (TID) | INTRAMUSCULAR | Status: DC
  Administered 2012-09-16 (×2): 5000 [IU] via SUBCUTANEOUS
  Filled 2012-09-15 (×4): qty 1

## 2012-09-15 MED ORDER — MIDAZOLAM HCL 2 MG/2ML IJ SOLN
INTRAMUSCULAR | Status: AC
  Filled 2012-09-15: qty 2

## 2012-09-15 MED ORDER — CLOPIDOGREL BISULFATE 75 MG PO TABS
75.0000 mg | ORAL_TABLET | Freq: Every day | ORAL | Status: DC
  Administered 2012-09-16 – 2012-09-18 (×3): 75 mg via ORAL
  Filled 2012-09-15 (×4): qty 1

## 2012-09-15 MED ORDER — POTASSIUM CHLORIDE CRYS ER 20 MEQ PO TBCR
40.0000 meq | EXTENDED_RELEASE_TABLET | Freq: Once | ORAL | Status: AC
  Administered 2012-09-15: 40 meq via ORAL
  Filled 2012-09-15: qty 2

## 2012-09-15 MED ORDER — FENTANYL CITRATE 0.05 MG/ML IJ SOLN
INTRAMUSCULAR | Status: AC
  Filled 2012-09-15: qty 2

## 2012-09-15 MED ORDER — HEPARIN (PORCINE) IN NACL 2-0.9 UNIT/ML-% IJ SOLN
INTRAMUSCULAR | Status: AC
  Filled 2012-09-15: qty 1000

## 2012-09-15 MED ORDER — NITROGLYCERIN 0.2 MG/ML ON CALL CATH LAB
INTRAVENOUS | Status: AC
  Filled 2012-09-15: qty 1

## 2012-09-15 MED ORDER — SODIUM CHLORIDE 0.9 % IV SOLN
1.0000 mL/kg/h | INTRAVENOUS | Status: AC
  Administered 2012-09-15: 17:00:00 1 mL/kg/h via INTRAVENOUS

## 2012-09-15 MED ORDER — CLOPIDOGREL BISULFATE 300 MG PO TABS
ORAL_TABLET | ORAL | Status: AC
  Filled 2012-09-15: qty 2

## 2012-09-15 MED ORDER — LIDOCAINE HCL (PF) 1 % IJ SOLN
INTRAMUSCULAR | Status: AC
  Filled 2012-09-15: qty 30

## 2012-09-15 MED ORDER — ASPIRIN 81 MG PO CHEW
81.0000 mg | CHEWABLE_TABLET | Freq: Every day | ORAL | Status: DC
  Administered 2012-09-15 – 2012-09-16 (×2): 81 mg via ORAL
  Filled 2012-09-15 (×2): qty 1

## 2012-09-15 NOTE — H&P (View-Only) (Signed)
[facility] PROGRESS NOTE  Trevor Simpson ZOX:[id number] DOB: November 10, 1933 DOA: 09/12/2012 PCP: Pearson Grippe, MD  Assessment/Plan: Acute on chronic diastolic heart failure  -Continue furosemide (on hold today for heart cath) -neg 6115cc for admission/ neg 4.0kg  -Check TSH--4.152 -09/14/2012 echo EF 25-30% with basal inferior akinesis, grade 2 diastolic dysfunction  -06/27/2009 echo EF 55-60%, no WMA  -appreciate cardiology recommendations -Plan as noted for heart catheterization and possible cardioversion -Discontinue diltiazem in the face of suppressed EF  -Continue carvedilol  -Soft BP has precluded lisinopril use Atrial fibrillation  -Rate controlled  -Continue rivaroxaban  -Continue carvedilol and digoxin Sick sinus syndrome  -St. Jude dual chamber PPM last interrogated 08/29/2012  Hypertension  -Discontinue Avapro and HCTZ as blood pressure is soft  -Low dose carvedilol  Hyponatremia  -Improving with diuresis  -Likely due to hypervolemia  Hyperlipidemia  -Continue statin  Family Communication: Wife and daughter at beside  Disposition Plan: Home when medically stable         Procedures/Studies: Dg Chest Port 1 View  09/12/2012   *RADIOLOGY REPORT*  Clinical Data: Chest pain.  Shortness of breath.  Former smoker with current history of COPD.  PORTABLE CHEST - 1 VIEW 09/12/2012 1949 hours:  Comparison: CT chest 09/12/2010, 12/13/2009.  Two-view chest x-ray 09/22/2009.  Findings: Suboptimal inspiration.  Cardiac silhouette enlarged but stable, allowing for differences in technique and degree of inspiration.  Linear scarring in the right lung base with superimposed mild atelectasis.  Lungs otherwise clear.  Pulmonary vascularity normal without evidence of pulmonary edema.  Left subclavian dual lead transvenous pacemaker unchanged.  Left costophrenic angle excluded from the image.  IMPRESSION: Suboptimal inspiration.  Scarring at the right lung base and associated mild  right basilar atelectasis.  No acute cardiopulmonary disease otherwise.  Stable cardiomegaly.   Original Report Authenticated By: Hulan Saas, M.D.         Subjective: Patient is feeling better. He denies any fevers, chills, chest discomfort, shortness of breath, nausea, vomiting, diarrhea. He is breathing better. No dysuria or hematuria. No abdominal pain.  Objective: Filed Vitals:   09/14/12 0525 09/14/12 1428 09/14/12 2050 09/15/12 0520  BP: 99/64 99/63 108/55 93/57  Pulse: 58 56 61 63  Temp: 97.2 F (36.2 C) 97.4 F (36.3 C) 97.5 F (36.4 C) 97.7 F (36.5 C)  TempSrc: Oral Oral Oral Oral  Resp: 18 19 18 17   Height:      Weight: 93.078 kg (205 lb 3.2 oz)   91.944 kg (202 lb 11.2 oz)  SpO2: 100% 99% 98% 98%    Intake/Output Summary (Last 24 hours) at 09/15/12 1335 Last data filed at 09/15/12 1030  Gross per 24 hour  Intake    940 ml  Output   4350 ml  Net  -3410 ml   Weight change: -1.134 kg (-2 lb 8 oz) Exam:   General:  Pt is alert, follows commands appropriately, not in acute distress  HEENT: No icterus, No thrush,  [place]/AT  Cardiovascular: IRRR,   Respiratory: CTA bilaterally, no wheezing, no crackles, no rhonchi  Abdomen: Soft/+BS, non tender, non distended, no guarding  Extremities: 1+ LE edema, No lymphangitis, No petechiae, No rashes, no synovitis  Data Reviewed: Basic Metabolic Panel:  Recent Labs Lab 09/12/12 1834 09/13/12 0435 09/14/12 0520 09/15/12 0045  NA 129* 133* 133* 133*  K 4.6 4.1 3.6 3.6  CL 95* 98 94* 95*  CO2 25 26 29 29   GLUCOSE 128* 88 109* 118*  BUN 30* 33*  34* 31*  CREATININE 1.41* 1.48* 1.33 1.31  CALCIUM 9.6 9.5 9.5 9.0  MG  --   --  1.9  --    Liver Function Tests: No results found for this basename: AST, ALT, ALKPHOS, BILITOT, PROT, ALBUMIN,  in the last 168 hours No results found for this basename: LIPASE, AMYLASE,  in the last 168 hours No results found for this basename: AMMONIA,  in the last 168  hours CBC:  Recent Labs Lab 09/12/12 1834  WBC 6.7  HGB 14.3  HCT 43.1  MCV 94.1  PLT 163   Cardiac Enzymes: No results found for this basename: CKTOTAL, CKMB, CKMBINDEX, TROPONINI,  in the last 168 hours BNP: No components found with this basename: POCBNP,  CBG: No results found for this basename: GLUCAP,  in the last 168 hours  No results found for this or any previous visit (from the past 240 hour(s)).   Scheduled Meds: . atorvastatin  20 mg Oral q1800  . calcium carbonate  1 tablet Oral BID WC  . carvedilol  6.25 mg Oral BID WC  . cholecalciferol  1,000 Units Oral Daily  . diazepam  5 mg Oral On Call  . [START ON 09/16/2012] digoxin  0.125 mg Oral Daily  . heparin subcutaneous  5,000 Units Subcutaneous Q8H  . levothyroxine  112 mcg Oral QAC breakfast  . loratadine  10 mg Oral Daily  . magnesium oxide  400 mg Oral Daily  . omega-3 acid ethyl esters  1 g Oral BID  . pantoprazole  40 mg Oral Daily  . sodium chloride  3 mL Intravenous Q12H  . vitamin C  500 mg Oral Daily   Continuous Infusions: . sodium chloride    . sodium chloride 75 mL/hr at 09/15/12 0452     Ethelreda Sukhu, DO  [facility] Pager [phone]  If 7PM-7AM, please contact night-coverage [email or web address] Password [email or web address] 09/15/2012, 1:35 PM   LOS: 3 days

## 2012-09-15 NOTE — Progress Notes (Signed)
Site area: right groin  Site Prior to Removal:  Level 0  Pressure Applied For 20 MINUTES    Minutes Beginning at 1810  Manual:   yes  Patient Status During Pull:  stable  Post Pull Groin Site:  Level 0  Post Pull Instructions Given:  yes  Post Pull Pulses Present:  yes  Dressing Applied:  yes  Comments:

## 2012-09-15 NOTE — Interval H&P Note (Signed)
History and Physical Interval Note:  09/15/2012 2:46 PM  Trevor Simpson  has presented today for surgery, with the diagnosis of cp  The various methods of treatment have been discussed with the patient and family. After consideration of risks, benefits and other options for treatment, the patient has consented to  Procedure(s): LEFT AND RIGHT HEART CATHETERIZATION WITH CORONARY ANGIOGRAM (N/A) as a surgical intervention .  The patient's history has been reviewed, patient examined, no change in status, stable for surgery.  I have reviewed the patient's chart and labs.  Questions were answered to the patient's satisfaction.   Cath Lab Visit (complete for each Cath Lab visit)  Clinical Evaluation Leading to the Procedure:   ACS: no  Non-ACS:    Anginal Classification: CCS III  Anti-ischemic medical therapy: Maximal Therapy (2 or more classes of medications)  Non-Invasive Test Results: No non-invasive testing performed  Prior CABG: No previous CABG        Trevor Simpson [facility] 09/15/2012 2:46 PM

## 2012-09-15 NOTE — Plan of Care (Signed)
Problem: Phase I Progression Outcomes Goal: EF % per last Echo/documented,Core Reminder form on chart Outcome: Completed/Met Date Met:  09/15/12 EF 25 -30% from 09/14/12, pt schedule for heat cath on Monday. Cardiac cath video presented to pt and wife. Pt oriented to keep NPO after MN for procedure.

## 2012-09-15 NOTE — Progress Notes (Signed)
[facility] PROGRESS NOTE  Trevor Simpson MRN:[id number] DOB: 03/22/1933 DOA: 09/12/2012 PCP: KIM, JAMES, MD  Assessment/Plan: Acute on chronic diastolic heart failure  -Continue furosemide (on hold today for heart cath) -neg 6115cc for admission/ neg 4.0kg  -Check TSH--4.152 -09/14/2012 echo EF 25-30% with basal inferior akinesis, grade 2 diastolic dysfunction  -06/27/2009 echo EF 55-60%, no WMA  -appreciate cardiology recommendations -Plan as noted for heart catheterization and possible cardioversion -Discontinue diltiazem in the face of suppressed EF  -Continue carvedilol  -Soft BP has precluded lisinopril use Atrial fibrillation  -Rate controlled  -Continue rivaroxaban  -Continue carvedilol and digoxin Sick sinus syndrome  -St. Jude dual chamber PPM last interrogated 08/29/2012  Hypertension  -Discontinue Avapro and HCTZ as blood pressure is soft  -Low dose carvedilol  Hyponatremia  -Improving with diuresis  -Likely due to hypervolemia  Hyperlipidemia  -Continue statin  Family Communication: Wife and daughter at beside  Disposition Plan: Home when medically stable         Procedures/Studies: Dg Chest Port 1 View  09/12/2012   *RADIOLOGY REPORT*  Clinical Data: Chest pain.  Shortness of breath.  Former smoker with current history of COPD.  PORTABLE CHEST - 1 VIEW 09/12/2012 1949 hours:  Comparison: CT chest 09/12/2010, 12/13/2009.  Two-view chest x-ray 09/22/2009.  Findings: Suboptimal inspiration.  Cardiac silhouette enlarged but stable, allowing for differences in technique and degree of inspiration.  Linear scarring in the right lung base with superimposed mild atelectasis.  Lungs otherwise clear.  Pulmonary vascularity normal without evidence of pulmonary edema.  Left subclavian dual lead transvenous pacemaker unchanged.  Left costophrenic angle excluded from the image.  IMPRESSION: Suboptimal inspiration.  Scarring at the right lung base and associated mild  right basilar atelectasis.  No acute cardiopulmonary disease otherwise.  Stable cardiomegaly.   Original Report Authenticated By: Thomas Lawrence, M.D.         Subjective: Patient is feeling better. He denies any fevers, chills, chest discomfort, shortness of breath, nausea, vomiting, diarrhea. He is breathing better. No dysuria or hematuria. No abdominal pain.  Objective: Filed Vitals:   09/14/12 0525 09/14/12 1428 09/14/12 2050 09/15/12 0520  BP: 99/64 99/63 108/55 93/57  Pulse: 58 56 61 63  Temp: 97.2 F (36.2 C) 97.4 F (36.3 C) 97.5 F (36.4 C) 97.7 F (36.5 C)  TempSrc: Oral Oral Oral Oral  Resp: 18 19 18 17  Height:      Weight: 93.078 kg (205 lb 3.2 oz)   91.944 kg (202 lb 11.2 oz)  SpO2: 100% 99% 98% 98%    Intake/Output Summary (Last 24 hours) at 09/15/12 1335 Last data filed at 09/15/12 1030  Gross per 24 hour  Intake    940 ml  Output   4350 ml  Net  -3410 ml   Weight change: -1.134 kg (-2 lb 8 oz) Exam:   General:  Pt is alert, follows commands appropriately, not in acute distress  HEENT: No icterus, No thrush,  [place]/AT  Cardiovascular: IRRR,   Respiratory: CTA bilaterally, no wheezing, no crackles, no rhonchi  Abdomen: Soft/+BS, non tender, non distended, no guarding  Extremities: 1+ LE edema, No lymphangitis, No petechiae, No rashes, no synovitis  Data Reviewed: Basic Metabolic Panel:  Recent Labs Lab 09/12/12 1834 09/13/12 0435 09/14/12 0520 09/15/12 0045  NA 129* 133* 133* 133*  K 4.6 4.1 3.6 3.6  CL 95* 98 94* 95*  CO2 25 26 29 29  GLUCOSE 128* 88 109* 118*  BUN 30* 33*   34* 31*  CREATININE 1.41* 1.48* 1.33 1.31  CALCIUM 9.6 9.5 9.5 9.0  MG  --   --  1.9  --    Liver Function Tests: No results found for this basename: AST, ALT, ALKPHOS, BILITOT, PROT, ALBUMIN,  in the last 168 hours No results found for this basename: LIPASE, AMYLASE,  in the last 168 hours No results found for this basename: AMMONIA,  in the last 168  hours CBC:  Recent Labs Lab 09/12/12 1834  WBC 6.7  HGB 14.3  HCT 43.1  MCV 94.1  PLT 163   Cardiac Enzymes: No results found for this basename: CKTOTAL, CKMB, CKMBINDEX, TROPONINI,  in the last 168 hours BNP: No components found with this basename: POCBNP,  CBG: No results found for this basename: GLUCAP,  in the last 168 hours  No results found for this or any previous visit (from the past 240 hour(s)).   Scheduled Meds: . atorvastatin  20 mg Oral q1800  . calcium carbonate  1 tablet Oral BID WC  . carvedilol  6.25 mg Oral BID WC  . cholecalciferol  1,000 Units Oral Daily  . diazepam  5 mg Oral On Call  . [START ON 09/16/2012] digoxin  0.125 mg Oral Daily  . heparin subcutaneous  5,000 Units Subcutaneous Q8H  . levothyroxine  112 mcg Oral QAC breakfast  . loratadine  10 mg Oral Daily  . magnesium oxide  400 mg Oral Daily  . omega-3 acid ethyl esters  1 g Oral BID  . pantoprazole  40 mg Oral Daily  . sodium chloride  3 mL Intravenous Q12H  . vitamin C  500 mg Oral Daily   Continuous Infusions: . sodium chloride    . sodium chloride 75 mL/hr at 09/15/12 0452     Preethi Scantlebury, DO  [facility] Pager [phone]  If 7PM-7AM, please contact night-coverage [email or web address] Password [email or web address] 09/15/2012, 1:35 PM   LOS: 3 days   

## 2012-09-15 NOTE — Progress Notes (Signed)
Pt a/o x 4, pt went for cardiac cath, pt not returning to unit, pt going to 6500

## 2012-09-15 NOTE — Plan of Care (Signed)
Problem: Phase I Progression Outcomes Goal: Dyspnea controlled at rest (HF) Outcome: Completed/Met Date Met:  09/15/12 Pt has not had any c/o SOB while at rest Goal: Pain controlled with appropriate interventions Outcome: Completed/Met Date Met:  09/15/12 Pt has not had any c/o pain Goal: Initial discharge plan identified Outcome: Completed/Met Date Met:  09/15/12 Initial plan is for pt to return home with wife

## 2012-09-15 NOTE — CV Procedure (Signed)
Cardiac Catheterization Procedure Note  Name: Trevor Simpson MRN: [id number] DOB: [birth year]  Procedure: Right Heart Cath, Left Heart Cath, Selective Coronary Angiography, LV angiography, Stent of the ramus intermediate artery.  Indication: 79 yo WM with history of coronary disease presents with symptoms of progressive dyspnea on exertion associated with chest tightness. Symptoms are class III. He has been on 2 antianginal agents. Echocardiogram shows significant decrease in his LV function compared to prior history.  Procedural Details: The right groin was prepped, draped, and anesthetized with 1% lidocaine. Using the modified Seldinger technique a 5 French sheath was placed in the right femoral artery and a 7 French sheath was placed in the right femoral vein. A Swan-Ganz catheter was used for the right heart catheterization. Standard protocol was followed for recording of right heart pressures and sampling of oxygen saturations. Fick cardiac output was calculated. Standard Judkins catheters were used for selective coronary angiography and left ventriculography. There were no immediate procedural complications.   Procedural Findings: Hemodynamics RA 12/14 with a mean of 11 mmHg RV 38/11 mmHg PA 41/23 with a mean of 29 mmHg PCWP 20 over 20 with a mean of 15 mmHg LV 101/13 mmHg AO 108/52 with a mean of 71 mmHg  Oxygen saturations: PA 63% AO 97%  Cardiac Output (Fick) 4.3 L per minute  Cardiac Index (Fick) 2.0 L per minute per meter square   Coronary angiography: Coronary dominance: right  Left mainstem: The left main is long with 20% stenosis distally.  Left anterior descending (LAD): The left anterior descending artery is moderately calcified in the proximal vessel. There is diffuse disease in the proximal vessel up to 30%. The first diagonal is relatively small and has mild disease proximally.  The ramus intermediate branch is a large branch that bifurcates in the mid vessel.  The proximal vessel has a 90-95% stenosis.  Left circumflex (LCx): The left circumflex is relatively small. It gives rise to a single marginal branch and then continues in the AV groove. The first marginal branch has diffuse 60-70% stenosis proximally.  Right coronary artery (RCA): The right coronary is occluded proximally. There are right to right and left to right collaterals.  Left ventriculography: Left ventricular size is moderately increased. There is severe global hypokinesis that is worse in the inferior wall distribution. Overall ejection fraction is estimated at 25-30%.   At this point we elected to proceed with PCI of the ramus intermediate branch. The right coronary occlusion is old and was noted in 2004. We exchanged for a 6 French arterial sheath. The patient was anticoagulated with IV bivalirudin. He was given Plavix 600 mg orally. After a therapeutic ACT was obtained a 6 [place] XB LAD 4 guide was inserted. A pro-water coronary guidewire was used to cross the lesion. The lesion was predilated with a 2.5 mm compliant balloon. The lesion was then stented with a 3.0 x 16 mm Veriflex stent. The stent was postdilated with a 3.0 mm noncompliant balloon. Following PCI, there was a Angiographic result with 0% residual stenosis and TIMI grade 3 flow. Femoral hemostasis was obtained with manual compression. There were no complications.  Vessel: Ramus intermediate/proximal Stenosis (pre-) 90-95% TIMI flow (pre-) 3 Stent: 3.0 x 16 mm Veriflex Stenosis (post) 0% TIMI flow (post) 3  Final Conclusions:   1. Severe two-vessel obstructive coronary disease. 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 2. Severe left ventricular dysfunction. 3. Normal right heart pressures. Left ventricular filling pressures are satisfactory. 4. Successful stenting of  the ramus intermediate branch with a bare-metal stent.  Recommendations:  Would continue baby aspirin and Plavix for one month. Recommend  resumption of anticoagulation tomorrow with Xarelto.   Trevor Simpson [facility] 09/15/2012, 3:50 PM

## 2012-09-16 DIAGNOSIS — I2589 Other forms of chronic ischemic heart disease: Secondary | ICD-10-CM

## 2012-09-16 LAB — BASIC METABOLIC PANEL
BUN: 24 mg/dL — ABNORMAL HIGH (ref 6–23)
Calcium: 8.8 mg/dL (ref 8.4–10.5)
Creatinine, Ser: 1.06 mg/dL (ref 0.50–1.35)
GFR calc Af Amer: 76 mL/min — ABNORMAL LOW (ref >90)
GFR calc non Af Amer: 66 mL/min — ABNORMAL LOW (ref >90)

## 2012-09-16 LAB — CBC
HCT: 39.5 % (ref 39.0–52.0)
MCH: 31.2 pg (ref 26.0–34.0)
MCHC: 32.9 g/dL (ref 30.0–36.0)
MCV: 94.7 fL (ref 78.0–100.0)
Platelets: 135 10*3/uL — ABNORMAL LOW (ref 150–400)
RDW: 13.9 % (ref 11.5–15.5)

## 2012-09-16 MED ORDER — RIVAROXABAN 20 MG PO TABS
20.0000 mg | ORAL_TABLET | Freq: Every day | ORAL | Status: DC
  Administered 2012-09-16 – 2012-09-17 (×2): 20 mg via ORAL
  Filled 2012-09-16 (×4): qty 1

## 2012-09-16 MED ORDER — METOPROLOL TARTRATE 25 MG PO TABS
25.0000 mg | ORAL_TABLET | Freq: Two times a day (BID) | ORAL | Status: DC
  Administered 2012-09-16 – 2012-09-18 (×5): 25 mg via ORAL
  Filled 2012-09-16 (×6): qty 1

## 2012-09-16 MED FILL — Sodium Chloride IV Soln 0.9%: INTRAVENOUS | Qty: 50 | Status: AC

## 2012-09-16 NOTE — Progress Notes (Addendum)
[facility] PROGRESS NOTE  Trevor Simpson XBJ:[id number] DOB: 10/24/1933 DOA: 09/12/2012 PCP: Pearson Grippe, MD  Assessment/Plan: Acute on chronic diastolic heart failure  -Continue furosemide--last dose given 09/14/12 -neg 6126cc for admission -Check TSH--4.152  -09/14/2012 echo EF 25-30% with basal inferior akinesis, grade 2 diastolic dysfunction  -06/27/2009 echo EF 55-60%, no WMA  -appreciate cardiology recommendations  -Plan as noted for  possible cardioversion  -Discontinue diltiazem in the face of suppressed EF  -Continue metoprolol tartrate -Soft BP has precluded lisinopril use  CAD -cardiac cath 09/15/12-->severe 2v disease, chronic total occlusion RCA -s/p PCI with bare metal stent Atrial fibrillation  -Rate controlled  -per Cards--Continue rivaroxaban + plavix -Continue metoprolol tartrate and digoxin  Sick sinus syndrome  -St. Jude dual chamber PPM last interrogated 08/29/2012  Hypertension  -Discontinue Avapro and HCTZ as blood pressure is soft  -Low dose carvedilol--changed to metoprolol tartrate per cardiology Hyponatremia  -Improved with diuresis  -Likely due to hypervolemia  Hyperlipidemia  -Continue statin  CKD stage 3 -serum creatinine better than usual baseline 1.2-1.5 Family Communication: Wife and daughter at beside  Disposition Plan: Home when cleared by cardiology         Procedures/Studies: Dg Chest Port 1 View  09/12/2012   *RADIOLOGY REPORT*  Clinical Data: Chest pain.  Shortness of breath.  Former smoker with current history of COPD.  PORTABLE CHEST - 1 VIEW 09/12/2012 1949 hours:  Comparison: CT chest 09/12/2010, 12/13/2009.  Two-view chest x-ray 09/22/2009.  Findings: Suboptimal inspiration.  Cardiac silhouette enlarged but stable, allowing for differences in technique and degree of inspiration.  Linear scarring in the right lung base with superimposed mild atelectasis.  Lungs otherwise clear.  Pulmonary vascularity normal without  evidence of pulmonary edema.  Left subclavian dual lead transvenous pacemaker unchanged.  Left costophrenic angle excluded from the image.  IMPRESSION: Suboptimal inspiration.  Scarring at the right lung base and associated mild right basilar atelectasis.  No acute cardiopulmonary disease otherwise.  Stable cardiomegaly.   Original Report Authenticated By: Hulan Saas, M.D.         Subjective: Patient is feeling well. He denies any fevers, chills, chest discomfort, shortness of breath, nausea, vomiting, diarrhea, dizziness, headache.  Objective: Filed Vitals:   09/16/12 0800 09/16/12 0803 09/16/12 1300 09/16/12 1700  BP: 114/82 114/82 108/63 115/72  Pulse: 35 98 90 92  Temp:  97.7 F (36.5 C) 97.8 F (36.6 C) 97.7 F (36.5 C)  TempSrc:  Oral Oral Oral  Resp: 16 23 18 20   Height:      Weight:      SpO2: 95% 97% 98% 98%    Intake/Output Summary (Last 24 hours) at 09/16/12 1745 Last data filed at 09/16/12 1300  Gross per 24 hour  Intake 893.55 ml  Output   1025 ml  Net -131.45 ml   Weight change: 2.056 kg (4 lb 8.5 oz) Exam:   General:  Pt is alert, follows commands appropriately, not in acute distress  HEENT: No icterus, No thrush, [place]/AT  Cardiovascular: RRR, S1/S2, no rubs, no gallops  Respiratory: CTA bilaterally, no wheezing, no crackles, no rhonchi  Abdomen: Soft/+BS, non tender, non distended, no guarding  Extremities: 1+LE edema, No lymphangitis, No petechiae, No rashes, no synovitis  Data Reviewed: Basic Metabolic Panel:  Recent Labs Lab 09/12/12 1834 09/13/12 0435 09/14/12 0520 09/15/12 0045 09/15/12 1656 09/16/12 0430  NA 129* 133* 133* 133*  --  134*  K 4.6 4.1 3.6 3.6  --  4.1  CL 95*  98 94* 95*  --  99  CO2 25 26 29 29   --  27  GLUCOSE 128* 88 109* 118*  --  96  BUN 30* 33* 34* 31*  --  24*  CREATININE 1.41* 1.48* 1.33 1.31 1.15 1.06  CALCIUM 9.6 9.5 9.5 9.0  --  8.8  MG  --   --  1.9  --   --   --    Liver Function Tests: No  results found for this basename: AST, ALT, ALKPHOS, BILITOT, PROT, ALBUMIN,  in the last 168 hours No results found for this basename: LIPASE, AMYLASE,  in the last 168 hours No results found for this basename: AMMONIA,  in the last 168 hours CBC:  Recent Labs Lab 09/12/12 1834 09/15/12 1656 09/16/12 0430  WBC 6.7 6.4 6.1  HGB 14.3 13.4 13.0  HCT 43.1 40.6 39.5  MCV 94.1 94.2 94.7  PLT 163 142* 135*   Cardiac Enzymes: No results found for this basename: CKTOTAL, CKMB, CKMBINDEX, TROPONINI,  in the last 168 hours BNP: No components found with this basename: POCBNP,  CBG: No results found for this basename: GLUCAP,  in the last 168 hours  No results found for this or any previous visit (from the past 240 hour(s)).   Scheduled Meds: . atorvastatin  20 mg Oral q1800  . calcium carbonate  1 tablet Oral BID WC  . cholecalciferol  1,000 Units Oral Daily  . clopidogrel  75 mg Oral Q breakfast  . digoxin  0.125 mg Oral Daily  . levothyroxine  112 mcg Oral QAC breakfast  . loratadine  10 mg Oral Daily  . magnesium oxide  400 mg Oral Daily  . metoprolol tartrate  25 mg Oral BID  . omega-3 acid ethyl esters  1 g Oral BID  . pantoprazole  40 mg Oral Daily  . rivaroxaban  20 mg Oral Daily  . vitamin C  500 mg Oral Daily   Continuous Infusions:    Trevor Perea, DO  [facility] Pager [phone]  If 7PM-7AM, please contact night-coverage [email or web address] Password [email or web address] 09/16/2012, 5:45 PM   LOS: 4 days

## 2012-09-16 NOTE — Progress Notes (Signed)
CARDIAC REHAB PHASE I   PRE:  Rate/Rhythm: 105 Afib  BP:  Supine: 114/82  Sitting:   Standing:    SaO2: 99 2L 96 RA  MODE:  Ambulation: 500 ft   POST:  Rate/Rhythm: 110-120 Afib  BP:  Supine:   Sitting: 134/90  Standing:    SaO2: 95 RA [id number] Assisted X 1 to ambulate. Gait steady with walk. Pt is DOE and tires as he walks but states that it is much improved. He leans forward with walking and walks with head down. Pt took two standing rest stops during walk and used hand rail in hall to hold to. He uses a cane or stick at home with long distances.After walk pt to chair in room. Completed CHF and stent education with pt and wife. They voice understanding. Pt agrees to Outpt. [facility] in [place], will send referral.  Melina Copa RN 09/16/2012 9:36 AM

## 2012-09-16 NOTE — Progress Notes (Signed)
Utilization Review Completed Yasmin Bronaugh J. Deundre Thong, RN, BSN, NCM [phone]  

## 2012-09-16 NOTE — Care Management Note (Addendum)
  Page 2 of 2   09/16/2012     11:22:05 AM   CARE MANAGEMENT NOTE 09/16/2012  Patient:  TAVARUS, POTEETE   Account Number:  [id number]  Date Initiated:  09/16/2012  Documentation initiated by:  [facility]  Subjective/Objective Assessment:   79 y.o. male with multiple medical problems who presents to the ED with complaints of worsening SOB over the past 3 days with orthopnea and DOE./ Home with spouse     Action/Plan:   diurese, heart cath/home with home health   Anticipated DC Date:  09/17/2012   Anticipated DC Plan:  HOME W HOME HEALTH SERVICES      DC Planning Services  CM consult  [facility] Program Scales      [facility] Choice  HOME HEALTH   Choice offered to / List presented to:  C-1 Patient        HH arranged  HH-1 RN  HH-10 DISEASE MANAGEMENT      HH agency  [facility]   Status of service:  Completed, signed off Medicare Important Message given?   (If response is "NO", the following Medicare IM given date fields will be blank) Date Medicare IM given:   Date Additional Medicare IM given:    Discharge Disposition:    Per UR Regulation:    If discussed at Long Length of Stay Meetings, dates discussed:    Comments:  09/16/12 1045.Marland KitchenMarland KitchenCamellia Amjad Fikes RN, BSN, [employer] [phone] Spoke with pt and wife regarding discharge planning for heart failure.  Offered pt list of home health agencies. Pt chose [facility] to render services.  Hilda Lias of [facility] notified.  Pt and wife identied the need for scale. NCM obtained scale through [facility] program and delivered to pt room.

## 2012-09-16 NOTE — Progress Notes (Signed)
Patient ID: Trevor Simpson, male   DOB: 02/11/1934, 79 y.o.   MRN: [id number] EP Attending  Discussed anti-coagulation strategy in terms of stroke prevention as well as preventing bleeding. I have reviewed with Dr. Graciela Husbands, [place] and pharmacy. I have decided to restart xarelto and stop warfarin. He will continue plavix and stop heparin and ASA. Leonia Reeves.D.

## 2012-09-16 NOTE — Progress Notes (Signed)
Patient: Trevor Simpson Date of Encounter: 09/16/2012, 7:38 AM Admit date: 09/12/2012     Subjective  Trevor Simpson he did not sleep well. He has "fluttering" palpitations and intermittent SOB but denies CP.   Objective  Physical Exam: Vitals: BP 101/50  Pulse 98  Temp(Src) 97.4 F (36.3 C) (Oral)  Resp 24  Ht 6' (1.829 m)  Wt 207 lb 3.7 oz (94 kg)  BMI 28.1 kg/m2  SpO2 98% General: Well developed, elderly appearing 79 year old male in no acute distress. Neck: Supple. +JVD. Lungs: Clear bilaterally to auscultation without wheezes, rales, or rhonchi. Breathing is unlabored. Heart: Irregularly irregular S1 S2 without murmur, rub or gallop.  Abdomen: Soft, non-distended. Extremities: No clubbing or cyanosis. Trace pedal edema.  Distal pedal pulses are 2+ and equal bilaterally. Neuro: Alert and oriented X 3. Moves all extremities spontaneously. No focal deficits.  Intake/Output:  Intake/Output Summary (Last 24 hours) at 09/16/12 0738 Last data filed at 09/16/12 0500  Gross per 24 hour  Intake 873.55 ml  Output   1475 ml  Net -601.45 ml    Inpatient Medications:  . aspirin  81 mg Oral Daily  . atorvastatin  20 mg Oral q1800  . calcium carbonate  1 tablet Oral BID WC  . carvedilol  6.25 mg Oral BID WC  . cholecalciferol  1,000 Units Oral Daily  . clopidogrel  75 mg Oral Q breakfast  . digoxin  0.125 mg Oral Daily  . heparin  5,000 Units Subcutaneous Q8H  . levothyroxine  112 mcg Oral QAC breakfast  . loratadine  10 mg Oral Daily  . magnesium oxide  400 mg Oral Daily  . omega-3 acid ethyl esters  1 g Oral BID  . pantoprazole  40 mg Oral Daily  . vitamin C  500 mg Oral Daily    Labs:  Recent Labs  09/14/12 0520 09/15/12 0045 09/15/12 1656 09/16/12 0430  NA 133* 133*  --  134*  K 3.6 3.6  --  4.1  CL 94* 95*  --  99  CO2 29 29  --  27  GLUCOSE 109* 118*  --  96  BUN 34* 31*  --  24*  CREATININE 1.33 1.31 1.15 1.06  CALCIUM 9.5 9.0  --  8.8  MG 1.9   --   --   --     Recent Labs  09/15/12 1656 09/16/12 0430  WBC 6.4 6.1  HGB 13.4 13.0  HCT 40.6 39.5  MCV 94.2 94.7  PLT 142* 135*    Recent Labs  09/15/12 0045  TSH 4.152    Recent Labs  09/15/12 0045  INR 1.64*    Radiology/Studies: Dg Chest Port 1 View  09/12/2012   *RADIOLOGY REPORT*  Clinical Data: Chest pain.  Shortness of breath.  Former smoker with current history of COPD.  PORTABLE CHEST - 1 VIEW 09/12/2012 1949 hours:  Comparison: CT chest 09/12/2010, 12/13/2009.  Two-view chest x-ray 09/22/2009.  Findings: Suboptimal inspiration.  Cardiac silhouette enlarged but stable, allowing for differences in technique and degree of inspiration.  Linear scarring in the right lung base with superimposed mild atelectasis.  Lungs otherwise clear.  Pulmonary vascularity normal without evidence of pulmonary edema.  Left subclavian dual lead transvenous pacemaker unchanged.  Left costophrenic angle excluded from the image.  IMPRESSION: Suboptimal inspiration.  Scarring at the right lung base and associated mild right basilar atelectasis.  No acute cardiopulmonary disease otherwise.  Stable cardiomegaly.  Original Report Authenticated By: Hulan Saas, M.D.    Cardiac catheterization: Procedural Findings:  Hemodynamics  RA 12/14 with a mean of 11 mmHg  RV 38/11 mmHg  PA 41/23 with a mean of 29 mmHg  PCWP 20 over 20 with a mean of 15 mmHg  LV 101/13 mmHg  AO 108/52 with a mean of 71 mmHg  Oxygen saturations:  PA 63%  AO 97%  Cardiac Output (Fick) 4.3 L per minute  Cardiac Index (Fick) 2.0 L per minute per meter square  Coronary angiography:  Coronary dominance: right  Left mainstem: The left main is long with 20% stenosis distally.  Left anterior descending (LAD): The left anterior descending artery is moderately calcified in the proximal vessel. There is diffuse disease in the proximal vessel up to 30%. The first diagonal is relatively small and has mild disease  proximally.  The ramus intermediate branch is a large branch that bifurcates in the mid vessel. The proximal vessel has a 90-95% stenosis.  Left circumflex (LCx): The left circumflex is relatively small. It gives rise to a single marginal branch and then continues in the AV groove. The first marginal branch has diffuse 60-70% stenosis proximally.  Right coronary artery (RCA): The right coronary is occluded proximally. There are right to right and left to right collaterals.  Left ventriculography: Left ventricular size is moderately increased. There is severe global hypokinesis that is worse in the inferior wall distribution. Overall ejection fraction is estimated at 25-30%.  At this point we elected to proceed with PCI of the ramus intermediate branch. The right coronary occlusion is old and was noted in 2004. We exchanged for a 6 French arterial sheath. The patient was anticoagulated with IV bivalirudin. He was given Plavix 600 mg orally. After a therapeutic ACT was obtained a 6 [place] XB LAD 4 guide was inserted. A pro-water coronary guidewire was used to cross the lesion. The lesion was predilated with a 2.5 mm compliant balloon. The lesion was then stented with a 3.0 x 16 mm Veriflex stent. The stent was postdilated with a 3.0 mm noncompliant balloon. Following PCI, there was a  Angiographic result with 0% residual stenosis and TIMI grade 3 flow. Femoral hemostasis was obtained with manual compression. There were no complications.  Vessel: Ramus intermediate/proximal  Stenosis (pre-) 90-95%  TIMI flow (pre-) 3  Stent: 3.0 x 16 mm Veriflex  Stenosis (post) 0%  TIMI flow (post) 3  Final Conclusions:  1. Severe two-vessel obstructive coronary disease. 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  2. Severe left ventricular dysfunction.  3. Normal right heart pressures. Left ventricular filling pressures are satisfactory.  4. Successful stenting of the ramus intermediate branch with a bare-metal  stent.  Recommendations: Would continue baby aspirin and Plavix for one month. Recommend resumption of anticoagulation tomorrow with Xarelto.   Telemetry: AF 100-110 bpm; occasional PVCs    Assessment and Plan  1. Acute systolic HF with new LV dysfunction, EF 25-30% 2. CAD s/p BMS to ramus intermediate branch yesterday - continue Plavix and ASA 81 mg daily x 1 month; continue BB and statin  3. AF - rate not well controlled; continue BB and digoxin for rate control; I am not convinced restarting Xarelto is safe in setting of Plavix and ASA. Will start coumadin, work on rate control and plan DCCV once INR is therapeutic. This is a very difficult combination of problems. 4. HTN - stable; normotensive currently; BP has been low so will stop coreg and  try metoprolol for better rate control and less blood pressure lowering 5. Tachy-brady syndrome s/p PPM implant  Dr. Ladona Ridgel to see Signed, Exie Parody  EP Attending Patient seen and examined. Agree with note above and changes made. Will start coumadin, work on rate control, and plan TEE guided DCCV once INR is therapeutic.  Leonia Reeves.D.

## 2012-09-17 LAB — BASIC METABOLIC PANEL
CO2: 28 mEq/L (ref 19–32)
Calcium: 9.3 mg/dL (ref 8.4–10.5)
GFR calc non Af Amer: 53 mL/min — ABNORMAL LOW (ref >90)
Potassium: 5.1 mEq/L (ref 3.5–5.1)
Sodium: 133 mEq/L — ABNORMAL LOW (ref 135–145)

## 2012-09-17 NOTE — Progress Notes (Signed)
09/17/2012-Patient heart rhythm in AFIB with occasional PVC's. Patient started complaining of some SOB, O2 sats taken and patient 100% on room air.  Patient may go for a TEE with cardioversion tomorrow. Anastasia Fiedler RN

## 2012-09-17 NOTE — Progress Notes (Signed)
CARDIAC REHAB PHASE I   PRE:  Rate/Rhythm: 103 Afib  BP:  Sitting: 117/74     SaO2: 93 RA  MODE:  Ambulation: 550 ft   POST:  Rate/Rhythm: 100 Afib  BP:  Sitting: 127/89     SaO2: 83 RA- after 5 minutes and some pursed lip breathing it came up to 94 RA  9:45am-10:15am Patient walked fair.  Had to use handrail in hallway. Needed to stop twice, briefly. Felt very tired and SOB. Tried to get oxygen saturation during walking but it wouldn't register. When we got back to the room his SAT was difficult to get.  Educated the patient on pursed lip breathing.   Theresa Duty, [place] 09/17/2012 10:11 AM

## 2012-09-17 NOTE — Progress Notes (Signed)
[facility] [place] TEAM 1 - Stepdown/ICU TEAM  Spoke with Dr. Valera Castle.  In that active medical conditions requiring treatment are primarily cardiac in nature Dr. Daleen Squibb has agreed to assume the attending role for this patient.  [facility] will sign off.  Please feel free to call us if there is any way we can assist further in the care of this patient.  Lonia Blood, MD [facility] Office  [phone] Pager [phone]  On-Call/Text Page:      [email or web address]      password [facility]

## 2012-09-17 NOTE — Progress Notes (Addendum)
Patient ID: DRAYTON TIEU, male   DOB: 02-Apr-1933, 79 y.o.   MRN: [id number]   Patient Name: Trevor Simpson Date of Encounter: 09/17/2012    SUBJECTIVE  Biggest complaint today is that he didn't feel like he passed enough water yesterday. Looking at his input and output she was negative. He thought he got too much fluid. He denies being short of breath. His BUN and creatinine are actually increased from yesterday. He received his first day of Xarelto yesterday per Dr. Lubertha Basque note. His rate better controlled in the high 80s. He is not receiving any Lasix.  CURRENT MEDS . atorvastatin  20 mg Oral q1800  . calcium carbonate  1 tablet Oral BID WC  . cholecalciferol  1,000 Units Oral Daily  . clopidogrel  75 mg Oral Q breakfast  . digoxin  0.125 mg Oral Daily  . levothyroxine  112 mcg Oral QAC breakfast  . loratadine  10 mg Oral Daily  . magnesium oxide  400 mg Oral Daily  . metoprolol tartrate  25 mg Oral BID  . omega-3 acid ethyl esters  1 g Oral BID  . pantoprazole  40 mg Oral Daily  . rivaroxaban  20 mg Oral Daily  . vitamin C  500 mg Oral Daily    OBJECTIVE  Filed Vitals:   09/16/12 2105 09/16/12 2208 09/17/12 0012 09/17/12 0510  BP: 115/56 125/72 137/33 111/74  Pulse: 79 84 86 86  Temp: 97.4 F (36.3 C)  97.3 F (36.3 C) 97.4 F (36.3 C)  TempSrc: Oral  Oral Oral  Resp: 15  18 20   Height:      Weight:   208 lb 8.9 oz (94.6 kg)   SpO2: 100%  95% 99%    Intake/Output Summary (Last 24 hours) at 09/17/12 0746 Last data filed at 09/17/12 0500  Gross per 24 hour  Intake    960 ml  Output    800 ml  Net    160 ml   Filed Weights   09/15/12 0520 09/16/12 0019 09/17/12 0012  Weight: 202 lb 11.2 oz (91.944 kg) 207 lb 3.7 oz (94 kg) 208 lb 8.9 oz (94.6 kg)    PHYSICAL EXAM  General: Pleasant, NAD. Chronically ill Neuro: Alert and oriented X 3. Moves all extremities spontaneously. Psych: Normal affect. HEENT:  Normal  Neck: Supple without bruits or JVD. Lungs:   Resp regular and unlabored, CTA. Heart: Irregular rate and rhythm, no s3, s4, or murmurs. Abdomen: Soft, non-tender, non-distended, BS + x 4.  Extremities: No clubbing, cyanosis or edema. DP/PT/Radials 2+ and equal bilaterally.  Accessory Clinical Findings  CBC  Recent Labs  09/15/12 1656 09/16/12 0430  WBC 6.4 6.1  HGB 13.4 13.0  HCT 40.6 39.5  MCV 94.2 94.7  PLT 142* 135*   Basic Metabolic Panel  Recent Labs  09/16/12 0430 09/17/12 0626  NA 134* 133*  K 4.1 5.1  CL 99 96  CO2 27 28  GLUCOSE 96 142*  BUN 24* 25*  CREATININE 1.06 1.27  CALCIUM 8.8 9.3   Liver Function Tests No results found for this basename: AST, ALT, ALKPHOS, BILITOT, PROT, ALBUMIN,  in the last 72 hours No results found for this basename: LIPASE, AMYLASE,  in the last 72 hours Cardiac Enzymes No results found for this basename: CKTOTAL, CKMB, CKMBINDEX, TROPONINI,  in the last 72 hours BNP No components found with this basename: POCBNP,  D-Dimer No results found for this basename: DDIMER,  in the last  72 hours Hemoglobin A1C No results found for this basename: HGBA1C,  in the last 72 hours Fasting Lipid Panel No results found for this basename: CHOL, HDL, LDLCALC, TRIG, CHOLHDL, LDLDIRECT,  in the last 72 hours Thyroid Function Tests  Recent Labs  09/15/12 0045  TSH 4.152    TELE A. fib with PVCs.  ECG    Radiology/Studies  Dg Chest Port 1 View  09/12/2012   *RADIOLOGY REPORT*  Clinical Data: Chest pain.  Shortness of breath.  Former smoker with current history of COPD.  PORTABLE CHEST - 1 VIEW 09/12/2012 1949 hours:  Comparison: CT chest 09/12/2010, 12/13/2009.  Two-view chest x-ray 09/22/2009.  Findings: Suboptimal inspiration.  Cardiac silhouette enlarged but stable, allowing for differences in technique and degree of inspiration.  Linear scarring in the right lung base with superimposed mild atelectasis.  Lungs otherwise clear.  Pulmonary vascularity normal without evidence of  pulmonary edema.  Left subclavian dual lead transvenous pacemaker unchanged.  Left costophrenic angle excluded from the image.  IMPRESSION: Suboptimal inspiration.  Scarring at the right lung base and associated mild right basilar atelectasis.  No acute cardiopulmonary disease otherwise.  Stable cardiomegaly.   Original Report Authenticated By: Hulan Saas, M.D.    ASSESSMENT AND PLAN  Principal Problem:   Acute systolic congestive heart failure Active Problems:   CAD   ATRIAL FIBRILLATION, PAROXYSMAL   BRADYCARDIA-TACHYCARDIA SYNDROME   Hyponatremia   Cardiomyopathy, ischemic    We'll transfer to telemetry today. Rate is better controlled and he is stable. He needs 2 more doses of anticoagulation before doing a TEE cardioversion. That would be July 4 which creates a problem of scheduling. He wants to go home. I will discuss with Dr. Ladona Ridgel. We'll hold on any diuretic this morning since he was negative yesterday and his creatinine is increased. Complex situation. Patient and wife had multiple questions all of which have answered. He is not on ACE inhibitor but will not start today. This should be started in the future.  Signed, Valera Castle MD

## 2012-09-18 DIAGNOSIS — I200 Unstable angina: Secondary | ICD-10-CM | POA: Diagnosis present

## 2012-09-18 MED ORDER — FUROSEMIDE 10 MG/ML IJ SOLN
INTRAMUSCULAR | Status: AC
  Filled 2012-09-18: qty 8

## 2012-09-18 MED ORDER — AMIODARONE HCL 200 MG PO TABS
200.0000 mg | ORAL_TABLET | Freq: Every day | ORAL | Status: DC
  Filled 2012-09-18: qty 1

## 2012-09-18 MED ORDER — CLOPIDOGREL BISULFATE 75 MG PO TABS: 75.0000 mg | ORAL_TABLET | Freq: Every day | ORAL | Status: DC

## 2012-09-18 MED ORDER — FUROSEMIDE 10 MG/ML IJ SOLN
60.0000 mg | Freq: Once | INTRAMUSCULAR | Status: AC
  Administered 2012-09-18: 60 mg via INTRAVENOUS
  Filled 2012-09-18: qty 6

## 2012-09-18 MED ORDER — AMIODARONE HCL 200 MG PO TABS: 200.0000 mg | ORAL_TABLET | Freq: Every day | ORAL | Status: DC

## 2012-09-18 MED ORDER — METOPROLOL TARTRATE 50 MG PO TABS: 50.0000 mg | ORAL_TABLET | Freq: Two times a day (BID) | ORAL | Status: DC

## 2012-09-18 MED ORDER — METOPROLOL TARTRATE 50 MG PO TABS
50.0000 mg | ORAL_TABLET | Freq: Two times a day (BID) | ORAL | Status: DC
  Filled 2012-09-18: qty 1

## 2012-09-18 NOTE — Discharge Summary (Signed)
CARDIOLOGY DISCHARGE SUMMARY   Patient ID: Trevor Simpson MRN: [id number] DOB/AGE: 1933/07/31 79 y.o.  Admit date: 09/12/2012 Discharge date: 09/18/2012  Primary Discharge Diagnosis:    Acute systolic congestive heart failure, NYHA class 4  Secondary Discharge Diagnosis:    Intermediate coronary syndrome - status post bare-metal stent to the RI   CAD   ATRIAL FIBRILLATION, PAROXYSMAL   BRADYCARDIA-TACHYCARDIA SYNDROME   Hyponatremia   Cardiomyopathy, ischemic  Consults:  Internal medicine  Procedures: Right Heart Cath, Left Heart Cath, Selective Coronary Angiography, LV angiography, Stent of the ramus intermediate artery   Hospital Course: Trevor Simpson is a 79 y.o. male with a history of CAD, PAF, status post pacemaker. He developed palpitations and shortness of breath. His shortness of breath progressed to shortness of breath at rest. He had also developed intermittent chest pain that was concerning for progressive angina by description. He was admitted and found to have congestive heart failure. His EF was 25%. This was a significant decline. He was initially admitted by internal medicine and cardiology was consulted.  He was diuresed with IV Lasix and his intake/output was negative for more than 7 L during his hospital stay. As he diuresed, his respiratory status improved. By discharge, his oxygen saturation was 98% on room air.  By 09/15/2012, his respiratory status had improved to the point that he was taken to the cath lab for a right and left heart cath. Full cardiac catheterization results below. His RCA was chronically occluded which was known. His ramus intermedius had a 90% stenosis and this was treated with a bare-metal stent, reducing the stenosis to 0. He tolerated the procedure well. He is to be on aspirin and Plavix for a month in addition to the Xarelto.  He was seen by cardiac rehabilitation and had some problems with fatigue and shortness of breath. His exercise  tolerance was poor. He is to continue ambulation and attempt to increase his activity at home.   On 09/18/2012, Trevor Simpson was seen by Dr Ladona Ridgel. He was ambulating without chest pain and his SOB was improving. Dr Ladona Ridgel considered him stable for discharge, to follow up as an outpatient.   Labs:  Lab Results  Component Value Date   WBC 6.1 09/16/2012   HGB 13.0 09/16/2012   HCT 39.5 09/16/2012   MCV 94.7 09/16/2012   PLT 135* 09/16/2012     Recent Labs Lab 09/17/12 0626  NA 133*  K 5.1  CL 96  CO2 28  BUN 25*  CREATININE 1.27  CALCIUM 9.3  GLUCOSE 142*   Pro B Natriuretic peptide (BNP)  Date/Time Value Range Status  09/12/2012  6:34 PM 4730.0* 0 - 450 pg/mL Final     Radiology: Dg Chest Port 1 View 09/12/2012   *RADIOLOGY REPORT*  Clinical Data: Chest pain.  Shortness of breath.  Former smoker with current history of COPD.  PORTABLE CHEST - 1 VIEW 09/12/2012 1949 hours:  Comparison: CT chest 09/12/2010, 12/13/2009.  Two-view chest x-ray 09/22/2009.  Findings: Suboptimal inspiration.  Cardiac silhouette enlarged but stable, allowing for differences in technique and degree of inspiration.  Linear scarring in the right lung base with superimposed mild atelectasis.  Lungs otherwise clear.  Pulmonary vascularity normal without evidence of pulmonary edema.  Left subclavian dual lead transvenous pacemaker unchanged.  Left costophrenic angle excluded from the image.  IMPRESSION: Suboptimal inspiration.  Scarring at the right lung base and associated mild right basilar atelectasis.  No acute cardiopulmonary disease otherwise.  Stable cardiomegaly.   Original Report Authenticated By: Hulan Saas, M.D.   Cardiac Cath: 09/15/2012 Procedural Findings:  Hemodynamics  RA 12/14 with a mean of 11 mmHg  RV 38/11 mmHg  PA 41/23 with a mean of 29 mmHg  PCWP 20 over 20 with a mean of 15 mmHg  LV 101/13 mmHg  AO 108/52 with a mean of 71 mmHg  Oxygen saturations:  PA 63%  AO 97%  Cardiac Output (Fick) 4.3 L  per minute  Cardiac Index (Fick) 2.0 L per minute per meter square  Coronary angiography:  Coronary dominance: right  Left mainstem: The left main is long with 20% stenosis distally.  Left anterior descending (LAD): The left anterior descending artery is moderately calcified in the proximal vessel. There is diffuse disease in the proximal vessel up to 30%. The first diagonal is relatively small and has mild disease proximally.  The ramus intermediate branch is a large branch that bifurcates in the mid vessel. The proximal vessel has a 90-95% stenosis.  Left circumflex (LCx): The left circumflex is relatively small. It gives rise to a single marginal branch and then continues in the AV groove. The first marginal branch has diffuse 60-70% stenosis proximally.  Right coronary artery (RCA): The right coronary is occluded proximally. There are right to right and left to right collaterals.  Left ventriculography: Left ventricular size is moderately increased. There is severe global hypokinesis that is worse in the inferior wall distribution. Overall ejection fraction is estimated at 25-30%.  At this point we elected to proceed with PCI of the ramus intermediate branch. The right coronary occlusion is old and was noted in 2004. We exchanged for a 6 French arterial sheath. The patient was anticoagulated with IV bivalirudin. He was given Plavix 600 mg orally. After a therapeutic ACT was obtained a 6 [place] XB LAD 4 guide was inserted. A pro-water coronary guidewire was used to cross the lesion. The lesion was predilated with a 2.5 mm compliant balloon. The lesion was then stented with a 3.0 x 16 mm Veriflex stent. The stent was postdilated with a 3.0 mm noncompliant balloon. Following PCI, there was a  Angiographic result with 0% residual stenosis and TIMI grade 3 flow. Femoral hemostasis was obtained with manual compression. There were no complications.  Vessel: Ramus intermediate/proximal  Stenosis (pre-)  90-95%  TIMI flow (pre-) 3  Stent: 3.0 x 16 mm Veriflex  Stenosis (post) 0%  TIMI flow (post) 3  Final Conclusions:  1. Severe two-vessel obstructive coronary disease. 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  2. Severe left ventricular dysfunction.  3. Normal right heart pressures. Left ventricular filling pressures are satisfactory.  4. Successful stenting of the ramus intermediate branch with a bare-metal stent.  Recommendations: Would continue baby aspirin and Plavix for one month. Recommend resumption of anticoagulation tomorrow with Xarelto.   EKG:  Atrial fibrillation, occasional paced beats.  Echo:  09/14/2012 Study Conclusions - Left ventricle: The cavity size was normal. There was mild focal basal hypertrophy of the septum. Systolic function was severely reduced. The estimated ejection fraction was in the range of 25% to 30%. Probable akinesis of the basalinferior myocardium. There is hypokinesis of the mid-distal myocardium. Features are consistent with a pseudonormal left ventricular filling pattern, with concomitant abnormal relaxation and increased filling pressure (grade 2 diastolic dysfunction). - Ventricular septum: Septal motion showed abnormal function and dyssynergy, possibly secondary to intermitent RV pacing and PVCs. - Aortic valve: Mildly calcified annulus. Trileaflet; moderately calcified  leaflets. Cusp separation was mildly reduced. Mild regurgitation. - Mitral valve: Calcified annulus. Moderate regurgitation. Effective regurgitant orifice: 0.26cm^2 (PISA). Regurgitant volume: 38ml (PISA). - Left atrium: The atrium was moderately to severely dilated. - Right ventricle: The cavity size was moderately dilated. Pacer wire or catheter noted in right ventricle. Systolic function was mildly reduced. - Right atrium: The atrium was mildly to moderately dilated. Pacer wire or catheter noted in right atrium. Central venous pressure: 15mm Hg (est). -  Tricuspid valve: Mild regurgitation. - Pulmonary arteries: PA peak pressure: 37mm Hg (S). - Pericardium, extracardiac: There was no pericardial effusion. Impressions: - Compared to prior study April 2011 there has been reduction in LV systolic function, now LVEF 25-30% with apparent inferior WMA, and septal dyssynchrony. Moderate diastolic dysfunction. Signfiicant left atrial enlargement. Mitral regurgitation has progressed to moderate range. Pacer wire noted in right heart. Moderate RV enlargement.Marland Kitchen PASP 27 mmHg with increased CVP.   FOLLOW UP PLANS AND APPOINTMENTS Allergies  Allergen Reactions  . Codeine     "tripped out on it"  . Morphine     "tripped out on it"     Medication List    STOP taking these medications       diltiazem 120 MG 24 hr capsule  Commonly known as:  DILACOR XR     olmesartan-hydrochlorothiazide 40-12.5 MG per tablet  Commonly known as:  BENICAR HCT      TAKE these medications       amiodarone 200 MG tablet  Commonly known as:  PACERONE  Take 1 tablet (200 mg total) by mouth daily.     atorvastatin 20 MG tablet  Commonly known as:  LIPITOR  Take 20 mg by mouth daily.     calcium carbonate 600 MG Tabs  Commonly known as:  OS-CAL  Take 600 mg by mouth 2 (two) times daily with a meal.     cetirizine 10 MG tablet  Commonly known as:  ZYRTEC  Take 10 mg by mouth daily.     Cholecalciferol 1000 UNITS capsule  Take 1,000 Units by mouth daily.     clopidogrel 75 MG tablet  Commonly known as:  PLAVIX  Take 1 tablet (75 mg total) by mouth daily with breakfast.     diphenhydramine-acetaminophen 25-500 MG Tabs  Commonly known as:  TYLENOL PM  Take 1-1.5 tablets by mouth at bedtime as needed (sleep).     fish oil-omega-3 fatty acids 1000 MG capsule  Take 1 g by mouth 2 (two) times daily.     levothyroxine 112 MCG tablet  Commonly known as:  SYNTHROID, LEVOTHROID  Take 112 mcg by mouth daily.     magnesium gluconate 500 MG tablet    Commonly known as:  MAGONATE  Take 500 mg by mouth daily.     metoprolol 50 MG tablet  Commonly known as:  LOPRESSOR  Take 1 tablet (50 mg total) by mouth 2 (two) times daily.     metoprolol tartrate 25 MG tablet  Commonly known as:  LOPRESSOR  Take 25 mg by mouth daily as needed (palpatations).     multivitamin capsule  Take 1 capsule by mouth daily.     nitroGLYCERIN 0.4 MG SL tablet  Commonly known as:  NITROSTAT  Place 1 tablet (0.4 mg total) under the tongue every 5 (five) minutes as needed for chest pain.     omeprazole 20 MG capsule  Commonly known as:  PRILOSEC  Take 20 mg by mouth daily as needed (indigestion).  Rivaroxaban 20 MG Tabs  Commonly known as:  XARELTO  Take 20 mg by mouth every evening.     SAW PALMETTO PO  Take 1 capsule by mouth 2 (two) times daily.     Super B Complex Tabs  Take 1 tablet by mouth daily.     vitamin C 500 MG tablet  Commonly known as:  ASCORBIC ACID  Take 500 mg by mouth daily.        Discharge Orders   Future Appointments Provider Department Dept Phone   10/20/2012 9:30 AM [facility] [employer] Main Office [place]) [phone]   11/11/2012 10:00 AM Marinus Maw, MD [facility] Main Office [place]) [phone]   Future Orders Complete By Expires     (HEART FAILURE PATIENTS) Call MD:  Anytime you have any of the following symptoms: 1) 3 pound weight gain in 24 hours or 5 pounds in 1 week 2) shortness of breath, with or without a dry hacking cough 3) swelling in the hands, feet or stomach 4) if you have to sleep on extra pillows at night in order to breathe.  As directed     Amb Referral to Cardiac Rehabilitation  As directed     Comments:      Pt agrees to referral to [facility]. CRP  Will send referral.    Diet - low sodium heart healthy  As directed     Increase activity slowly  As directed       Follow-up Information   Follow up with Lewayne Bunting, MD On 11/11/2012. (at 10:00 am)     Contact information:   [address] [place] [address] [phone]       BRING ALL MEDICATIONS WITH YOU TO FOLLOW UP APPOINTMENTS  Time spent with patient to include physician time: 39 min Signed: Theodore Demark, PA-C 09/18/2012, 1:08 PM Co-Sign MD

## 2012-09-18 NOTE — Progress Notes (Signed)
Patient Name: Trevor Simpson Date of Encounter: 09/18/2012  Principal Problem:   Acute systolic congestive heart failure, NYHA class 4 Active Problems:   Intermediate coronary syndrome   CAD   ATRIAL FIBRILLATION, PAROXYSMAL   BRADYCARDIA-TACHYCARDIA SYNDROME   Hyponatremia   Cardiomyopathy, ischemic    SUBJECTIVE: Pt denies chest pain, SOB is improved. Wants D/C. Gets SOB with activity and sats decreased yesterday to 83%.   OBJECTIVE Filed Vitals:   09/17/12 2103 09/18/12 0136 09/18/12 0613 09/18/12 1004  BP: 117/69 118/67 121/77 141/61  Pulse: 97 115 99 100  Temp: 97.9 F (36.6 C) 99 F (37.2 C) 97.3 F (36.3 C)   TempSrc: Oral Oral Oral Oral  Resp: 18 16 18 20   Height:      Weight:   208 lb (94.348 kg)   SpO2: 99% 97% 96% 98%    Intake/Output Summary (Last 24 hours) at 09/18/12 1125 Last data filed at 09/18/12 0815  Gross per 24 hour  Intake    480 ml  Output    725 ml  Net   -245 ml   Filed Weights   09/17/12 0012 09/17/12 1112 09/18/12 0613  Weight: 208 lb 8.9 oz (94.6 kg) 209 lb 3.2 oz (94.892 kg) 208 lb (94.348 kg)    PHYSICAL EXAM General: Well developed, well nourished, elderly male in no acute distress. Head: Normocephalic, atraumatic.  Neck: Supple without bruits, JVD at 9 cm. Lungs:  Resp regular and unlabored, bilateral rales, no wheeze. Heart: Irregular, S1, S2, no S3, S4, or murmur; no rub. Abdomen: Soft, non-tender, non-distended, BS + x 4.  Extremities: No clubbing, cyanosis, trace left, 1+ right edema.  Neuro: Alert and oriented X 3. Moves all extremities spontaneously. Psych: Normal affect.  LABS: CBC: Recent Labs  09/15/12 1656 09/16/12 0430  WBC 6.4 6.1  HGB 13.4 13.0  HCT 40.6 39.5  MCV 94.2 94.7  PLT 142* 135*   INR:No results found for this basename: INR,  in the last 72 hours Basic Metabolic Panel: Recent Labs  09/16/12 0430 09/17/12 0626  NA 134* 133*  K 4.1 5.1  CL 99 96  CO2 27 28  GLUCOSE 96 142*  BUN 24*  25*  CREATININE 1.06 1.27  CALCIUM 8.8 9.3   BNP: Pro B Natriuretic peptide (BNP)  Date/Time Value Range Status  09/12/2012  6:34 PM 4730.0* 0 - 450 pg/mL Final   TELE:   Atrial fib, RVR at times, v-pacing PRN, occ PVCs     Current Medications:  . atorvastatin  20 mg Oral q1800  . calcium carbonate  1 tablet Oral BID WC  . cholecalciferol  1,000 Units Oral Daily  . clopidogrel  75 mg Oral Q breakfast  . digoxin  0.125 mg Oral Daily  . furosemide      . levothyroxine  112 mcg Oral QAC breakfast  . loratadine  10 mg Oral Daily  . magnesium oxide  400 mg Oral Daily  . metoprolol tartrate  25 mg Oral BID  . omega-3 acid ethyl esters  1 g Oral BID  . pantoprazole  40 mg Oral Daily  . rivaroxaban  20 mg Oral Daily  . vitamin C  500 mg Oral Daily      ASSESSMENT AND PLAN: Principal Problem:   Acute systolic congestive heart failure, NYHA class 4 Active Problems:   Intermediate coronary syndrome   CAD   ATRIAL FIBRILLATION, PAROXYSMAL   BRADYCARDIA-TACHYCARDIA SYNDROME   Hyponatremia   Cardiomyopathy, ischemic  MD advise med changes. Got IV Lasix this am. D/C this afternoon. Will see if qualifies for home O2. Return next week for TEE/DCCV.   SignedTheodore Demark , PA-C 11:25 AM 09/18/2012  EP Attending Patient seen and examined. Agree with above. I am not happy with his ventricular rate and would like the patient to be discharged on metoprolol 50 mg twice daily and amiodarone 200 mg daily. He will need to be scheduled for TEE/DCCV next week as schedule permits.  Leonia Reeves.D.

## 2012-09-19 ENCOUNTER — Emergency Department (HOSPITAL_COMMUNITY)
Admission: EM | Admit: 2012-09-19 | Discharge: 2012-09-20 | Disposition: A | Discharge status: Home / Self Care | Payer: Medicare Other | Attending: Emergency Medicine | Admitting: Emergency Medicine

## 2012-09-19 ENCOUNTER — Encounter (HOSPITAL_COMMUNITY): Payer: Self-pay | Admitting: *Deleted

## 2012-09-19 ENCOUNTER — Emergency Department (HOSPITAL_COMMUNITY): Payer: Medicare Other

## 2012-09-19 DIAGNOSIS — Z79899 Other long term (current) drug therapy: Secondary | ICD-10-CM | POA: Insufficient documentation

## 2012-09-19 DIAGNOSIS — I10 Essential (primary) hypertension: Secondary | ICD-10-CM | POA: Insufficient documentation

## 2012-09-19 DIAGNOSIS — F411 Generalized anxiety disorder: Secondary | ICD-10-CM | POA: Insufficient documentation

## 2012-09-19 DIAGNOSIS — R0602 Shortness of breath: Secondary | ICD-10-CM

## 2012-09-19 DIAGNOSIS — I739 Peripheral vascular disease, unspecified: Secondary | ICD-10-CM | POA: Insufficient documentation

## 2012-09-19 DIAGNOSIS — Z950 Presence of cardiac pacemaker: Secondary | ICD-10-CM | POA: Insufficient documentation

## 2012-09-19 DIAGNOSIS — Z885 Allergy status to narcotic agent status: Secondary | ICD-10-CM | POA: Insufficient documentation

## 2012-09-19 DIAGNOSIS — I495 Sick sinus syndrome: Secondary | ICD-10-CM | POA: Insufficient documentation

## 2012-09-19 DIAGNOSIS — G4700 Insomnia, unspecified: Secondary | ICD-10-CM | POA: Insufficient documentation

## 2012-09-19 DIAGNOSIS — R110 Nausea: Secondary | ICD-10-CM | POA: Insufficient documentation

## 2012-09-19 DIAGNOSIS — R0789 Other chest pain: Secondary | ICD-10-CM | POA: Insufficient documentation

## 2012-09-19 DIAGNOSIS — Z87891 Personal history of nicotine dependence: Secondary | ICD-10-CM | POA: Insufficient documentation

## 2012-09-19 DIAGNOSIS — R142 Eructation: Secondary | ICD-10-CM | POA: Insufficient documentation

## 2012-09-19 DIAGNOSIS — I5023 Acute on chronic systolic (congestive) heart failure: Secondary | ICD-10-CM | POA: Insufficient documentation

## 2012-09-19 DIAGNOSIS — R609 Edema, unspecified: Secondary | ICD-10-CM | POA: Insufficient documentation

## 2012-09-19 DIAGNOSIS — F419 Anxiety disorder, unspecified: Secondary | ICD-10-CM

## 2012-09-19 DIAGNOSIS — I428 Other cardiomyopathies: Secondary | ICD-10-CM | POA: Insufficient documentation

## 2012-09-19 DIAGNOSIS — E785 Hyperlipidemia, unspecified: Secondary | ICD-10-CM | POA: Insufficient documentation

## 2012-09-19 DIAGNOSIS — R141 Gas pain: Secondary | ICD-10-CM | POA: Insufficient documentation

## 2012-09-19 DIAGNOSIS — I2510 Atherosclerotic heart disease of native coronary artery without angina pectoris: Secondary | ICD-10-CM | POA: Insufficient documentation

## 2012-09-19 DIAGNOSIS — I4891 Unspecified atrial fibrillation: Secondary | ICD-10-CM | POA: Insufficient documentation

## 2012-09-19 DIAGNOSIS — Z7901 Long term (current) use of anticoagulants: Secondary | ICD-10-CM | POA: Insufficient documentation

## 2012-09-19 LAB — BASIC METABOLIC PANEL
BUN: 39 mg/dL — ABNORMAL HIGH (ref 6–23)
CO2: 27 mEq/L (ref 19–32)
Calcium: 9.3 mg/dL (ref 8.4–10.5)
Chloride: 94 mEq/L — ABNORMAL LOW (ref 96–112)
Creatinine, Ser: 1.47 mg/dL — ABNORMAL HIGH (ref 0.50–1.35)
Glucose, Bld: 132 mg/dL — ABNORMAL HIGH (ref 70–99)

## 2012-09-19 LAB — CBC
Hemoglobin: 14.5 g/dL (ref 13.0–17.0)
MCH: 31.2 pg (ref 26.0–34.0)
MCHC: 33 g/dL (ref 30.0–36.0)
MCV: 94.6 fL (ref 78.0–100.0)
RBC: 4.65 MIL/uL (ref 4.22–5.81)

## 2012-09-19 LAB — PRO B NATRIURETIC PEPTIDE: Pro B Natriuretic peptide (BNP): 9116 pg/mL — ABNORMAL HIGH (ref 0–450)

## 2012-09-19 LAB — POCT I-STAT TROPONIN I: Troponin i, poc: 0.05 ng/mL (ref 0.00–0.08)

## 2012-09-19 MED ORDER — FUROSEMIDE 10 MG/ML IJ SOLN
80.0000 mg | Freq: Once | INTRAMUSCULAR | Status: AC
  Administered 2012-09-20: 80 mg via INTRAVENOUS
  Filled 2012-09-19: qty 8

## 2012-09-19 NOTE — ED Provider Notes (Signed)
History    CSN: [id number] Arrival date & time 09/19/12  2148  First MD Initiated Contact with Patient 09/19/12 2155     Chief Complaint  Patient presents with  . Shortness of Breath   (Consider location/radiation/quality/duration/timing/severity/associated sxs/prior Treatment) HPI patient is a 79 year old male with history of [place] heart associated class for congestive heart failure who presents emergency department for anxiety. Of note patient was discharged from the hospital yesterday for acute CHF exacerbation. His family reports that he has continued to have difficulty breathing and shortness of breath. Patient reports improvement in respiratory status compared to admission for his last hospitalization. His chief complaint is anxiety. He has been having increasing anxiety since last hospitalization. Anxiety has been worsening. He reports inability to sleep due to anxiety. No exacerbating or relieving factors at this time. Again reports improvement in his shortness of breath compared to the admission of his last hospitalization. He reports no orthopnea. Family reports that his respiratory status is unchanged and is continuing to be short of breath. He reports fullness in his abdomen which is where he retains fluid. Denies fevers chills sweats chest pain. He also reports decreased urine output. Past Medical History  Diagnosis Date  . Coronary artery disease   . Other and unspecified hyperlipidemia   . Hypertension   . Atrial fibrillation   . Sinoatrial node dysfunction   . PVD (peripheral vascular disease)     s/p renal artery stent 20-04  . Pacemaker st Jude   . AAA (abdominal aortic aneurysm)     repaired  . Systolic congestive heart failure   . Cardiomyopathy    Past Surgical History  Procedure Laterality Date  . Inguinal hernia repair      left  . Cardiac pacemaker placement  08/01/2009    st jude dual chamber  . Colonoscopy    . Esophagogastroduodenoscopy    .  Translumminal angioplasty     Family History  Problem Relation Age of Onset  . CAD Father   . CAD Brother   . Hypertension Father   . Hypertension Brother   . Diabetes Brother   . Lung cancer Sister    History  Substance Use Topics  . Smoking status: Former Games developer  . Smokeless tobacco: Former Neurosurgeon    Types: Chew  . Alcohol Use: No    Review of Systems  Constitutional: Negative for fever, chills, diaphoresis, activity change, appetite change and fatigue.  HENT: Negative for congestion, sore throat, rhinorrhea, sneezing and trouble swallowing.   Eyes: Negative for pain and redness.  Respiratory: Positive for chest tightness and shortness of breath. Negative for cough, choking, wheezing and stridor.   Cardiovascular: Positive for chest pain. Negative for leg swelling.  Gastrointestinal: Positive for nausea. Negative for vomiting, diarrhea, constipation, blood in stool, abdominal distention and anal bleeding.  Musculoskeletal: Negative for myalgias and back pain.  Skin: Negative for rash.  Neurological: Negative for dizziness, speech difficulty, weakness, light-headedness, numbness and headaches.  Hematological: Negative for adenopathy.  Psychiatric/Behavioral: Negative for confusion. The patient is nervous/anxious.     Allergies  Codeine and Morphine  Home Medications   Current Outpatient Rx  Name  Route  Sig  Dispense  Refill  . amiodarone (PACERONE) 200 MG tablet   Oral   Take 1 tablet (200 mg total) by mouth daily.   30 tablet   11   . atorvastatin (LIPITOR) 20 MG tablet   Oral   Take 20 mg by mouth daily.           Marland Kitchen  B Complex-C (SUPER B COMPLEX) TABS   Oral   Take 1 tablet by mouth daily.          . calcium carbonate (OS-CAL) 600 MG TABS   Oral   Take 600 mg by mouth 2 (two) times daily with a meal.           . cetirizine (ZYRTEC) 10 MG tablet   Oral   Take 10 mg by mouth daily.          . Cholecalciferol 1000 UNITS capsule   Oral   Take 1,000  Units by mouth daily.           . clopidogrel (PLAVIX) 75 MG tablet   Oral   Take 1 tablet (75 mg total) by mouth daily with breakfast.   30 tablet   0   . diphenhydramine-acetaminophen (TYLENOL PM) 25-500 MG TABS   Oral   Take 1-1.5 tablets by mouth at bedtime as needed (sleep).         . fish oil-omega-3 fatty acids 1000 MG capsule   Oral   Take 1 g by mouth 2 (two) times daily.          Marland Kitchen levothyroxine (SYNTHROID, LEVOTHROID) 112 MCG tablet   Oral   Take 112 mcg by mouth daily.         . magnesium gluconate (MAGONATE) 500 MG tablet   Oral   Take 500 mg by mouth daily.          . metoprolol (LOPRESSOR) 50 MG tablet   Oral   Take 1 tablet (50 mg total) by mouth 2 (two) times daily.   60 tablet   11   . Multiple Vitamin (MULTIVITAMIN WITH MINERALS) TABS   Oral   Take 1 tablet by mouth daily.         . nitroGLYCERIN (NITROSTAT) 0.4 MG SL tablet   Sublingual   Place 1 tablet (0.4 mg total) under the tongue every 5 (five) minutes as needed for chest pain.   25 tablet   3   . omeprazole (PRILOSEC) 20 MG capsule   Oral   Take 20 mg by mouth daily as needed (indigestion).          . Rivaroxaban (XARELTO) 20 MG TABS   Oral   Take 20 mg by mouth every evening.         . Saw Palmetto, Serenoa repens, (SAW PALMETTO PO)   Oral   Take 1 capsule by mouth 2 (two) times daily.         . vitamin C (ASCORBIC ACID) 500 MG tablet   Oral   Take 500 mg by mouth daily.            BP 117/79  Resp 18  SpO2 94% Physical Exam  Nursing note and vitals reviewed. Constitutional: He is oriented to person, place, and time. He appears well-developed and well-nourished. No distress.  HENT:  Head: Normocephalic and atraumatic.  Eyes: Conjunctivae and EOM are normal. Right eye exhibits no discharge. Left eye exhibits no discharge.  Neck: Normal range of motion. Neck supple. No tracheal deviation present.  Cardiovascular: Normal rate and regular rhythm.  Exam  reveals gallop and S3. Exam reveals no friction rub.   No murmur heard. Pulmonary/Chest: Effort normal and breath sounds normal. No accessory muscle usage or stridor. Not tachypneic. No respiratory distress. He has no decreased breath sounds. He has no wheezes. He has no rhonchi. He has no rales. He exhibits  no tenderness.  Abdominal: Soft. He exhibits distension. There is no tenderness. There is no rebound and no guarding.  Musculoskeletal: He exhibits edema.       Right lower leg: He exhibits edema (2+).       Left lower leg: He exhibits edema (2+).  Neurological: He is alert and oriented to person, place, and time.  Skin: Skin is warm.  Psychiatric: He has a normal mood and affect.    ED Course  Procedures (including critical care time) Labs Reviewed  PRO B NATRIURETIC PEPTIDE - Abnormal; Notable for the following:    Pro B Natriuretic peptide (BNP) 9116.0 (*)    All other components within normal limits  BASIC METABOLIC PANEL - Abnormal; Notable for the following:    Sodium 131 (*)    Chloride 94 (*)    Glucose, Bld 132 (*)    BUN 39 (*)    Creatinine, Ser 1.47 (*)    GFR calc non Af Amer 44 (*)    GFR calc Af Amer 51 (*)    All other components within normal limits  CBC  POCT I-STAT TROPONIN I   Dg Chest Port 1 View  09/19/2012   *RADIOLOGY REPORT*  Clinical Data: Shortness of breath.  Chest pain.  Anxiety for 2 days.  PORTABLE CHEST - 1 VIEW  Comparison: 09/12/2012.  Findings: Shallow inspiration.  There is increasing infiltration or atelectasis in both lungs since the previous study.  There is blunting of the left costophrenic angle suggesting a small effusion.  Cardiac enlargement without pulmonary vascular congestion.  No pneumothorax.  Calcified and tortuous aorta. Stable appearance of cardiac pacemaker.  IMPRESSION: Increasing infiltration or atelectasis in both lung bases with small developing left pleural effusion.   Original Report Authenticated By: Burman Nieves, M.D.    No diagnosis found. Results for orders placed during the hospital encounter of 09/19/12  CBC      Result Value Range   WBC 8.6  4.0 - 10.5 K/uL   RBC 4.65  4.22 - 5.81 MIL/uL   Hemoglobin 14.5  13.0 - 17.0 g/dL   HCT 40.9  81.1 - 91.4 %   MCV 94.6  78.0 - 100.0 fL   MCH 31.2  26.0 - 34.0 pg   MCHC 33.0  30.0 - 36.0 g/dL   RDW 78.2  95.6 - 21.3 %   Platelets 151  150 - 400 K/uL  PRO B NATRIURETIC PEPTIDE      Result Value Range   Pro B Natriuretic peptide (BNP) 9116.0 (*) 0 - 450 pg/mL  BASIC METABOLIC PANEL      Result Value Range   Sodium 131 (*) 135 - 145 mEq/L   Potassium 4.8  3.5 - 5.1 mEq/L   Chloride 94 (*) 96 - 112 mEq/L   CO2 27  19 - 32 mEq/L   Glucose, Bld 132 (*) 70 - 99 mg/dL   BUN 39 (*) 6 - 23 mg/dL   Creatinine, Ser 0.86 (*) 0.50 - 1.35 mg/dL   Calcium 9.3  8.4 - 57.8 mg/dL   GFR calc non Af Amer 44 (*) >90 mL/min   GFR calc Af Amer 51 (*) >90 mL/min  POCT I-STAT TROPONIN I      Result Value Range   Troponin i, poc 0.05  0.00 - 0.08 ng/mL   Comment 3             MDM  Patient 79 year old male with CHF [place] heart class for  presents emergency department for anxiety, but family says story status has not improved since his last hospitalization. Patient's chief complaint is anxiety. He is afebrile vital signs are stable. Oxygen saturation 96% on room air. Breath sounds clear to auscultation bilaterally and there is no evidence respiratory distress. Troponin negative. BNP elevated 9116. Renal function slightly worsened compared to baseline. Chest x-ray shows worsening infiltration personnel ectasis and a small pleural effusion. Laboratory status not compromised likely suffering from worsening CHF exacerbation. Cardiology consulted, saw patient at bedside. Give Lasix and metolazone in the emergency department. Recommendations regarding disposition pending with cardiology at this time. Patient care discussed with my attending, Dr. Preston Fleeting.  Sena Hitch,  MD 09/20/12 [phone]

## 2012-09-19 NOTE — ED Provider Notes (Addendum)
79 year old male who is his discharge yesterday after admission for her congestive heart failure and also had stenting of a coronary artery. He is supposed to schedule elective cardioversion next week. He states that last night he had "a rough night". He noticed that he was short of breath if he laid flat and was very anxious was unable to get to sleep, and it was also very sweaty. He denies chest pain, heaviness, tightness, pressure. He continues to feel bad today but has not noted orthopnea. On exam, lungs are clear and heart is irregularly irregular. He has 2+ presacral edema and 2+ pretibial edema. Laboratory workup shows worsening BUN and creatinine, increase in BNP level. Family has showed me his home weights and he gained 6 pounds over the course of the day today. Consultation will be obtained with cardiology to see if he just needs additional diuresis in the emergency department.   Date: 09/19/2012  Rate: 79  Rhythm: atrial fibrillation and Intermittent ventricular paced beats  QRS Axis: right  Intervals: normal  ST/T Wave abnormalities: nonspecific ST changes  Conduction Disutrbances:nonspecific intraventricular conduction delay  Narrative Interpretation: Atrial fibrillation with ventricular demand pacing, and nonspecific ST changes, nonspecific intraventricular conduction delay. When compared with ECG of 09/15/2012, significantly more intrinsic beats and then ventricular paced beats are present.  Old EKG Reviewed: changes noted   I saw and evaluated the patient, reviewed the resident's note and I agree with the findings and plan.   Dione Booze, MD 09/19/12 2311  Workup does show some worsening of CHF as evidenced by increase in BNP, and worsening renal failure with increasing BUN and creatinine. Case is discussed with Dr. Gala Romney of [facility] who recommends giving the patient IV furosemide and oral metolazone and observe to see if he has adequate diuresis.  Dione Booze,  MD 09/20/12 [phone]

## 2012-09-19 NOTE — ED Notes (Signed)
The pt was just discharged from this hospital yesterday.  He did not sleep much last pm because he could not breathe.  Not eating

## 2012-09-19 NOTE — ED Notes (Signed)
Malen Gauze, MD at bedside.

## 2012-09-20 DIAGNOSIS — R0602 Shortness of breath: Secondary | ICD-10-CM | POA: Diagnosis present

## 2012-09-20 DIAGNOSIS — F419 Anxiety disorder, unspecified: Secondary | ICD-10-CM | POA: Diagnosis present

## 2012-09-20 DIAGNOSIS — I2510 Atherosclerotic heart disease of native coronary artery without angina pectoris: Secondary | ICD-10-CM

## 2012-09-20 DIAGNOSIS — I4891 Unspecified atrial fibrillation: Secondary | ICD-10-CM

## 2012-09-20 DIAGNOSIS — I5023 Acute on chronic systolic (congestive) heart failure: Secondary | ICD-10-CM

## 2012-09-20 MED ORDER — FUROSEMIDE 80 MG PO TABS: 80.0000 mg | ORAL_TABLET | Freq: Every day | ORAL | Status: DC

## 2012-09-20 MED ORDER — METOLAZONE 2.5 MG PO TABS
2.5000 mg | ORAL_TABLET | Freq: Once | ORAL | Status: AC
  Administered 2012-09-20: 2.5 mg via ORAL
  Filled 2012-09-20: qty 1

## 2012-09-20 NOTE — Consult Note (Addendum)
Referring Physician: ED Primary Cardiologist: GT Reason for Consultation: CHF   HPI:  Trevor Simpson is a 79 y.o. male with a history of CAD, PAF, PAD, SSS s/p prior pacemaker implantation. Admitted last week with acute HF in the setting of recurrent AF with mildly elevated rates. EF found to be newly decreased to 25-30% with significant RV dysfunctiona s well.   He was diuresed and underwent R&L HC with findings of chronically occluded RCA and 90% ramus lesion which was stented. RHC as below  RA 12/14 with a mean of 11 mmHg  RV 38/11 mmHg  PA 41/23 with a mean of 29 mmHg  PCWP 20 over 20 with a mean of 15 mmHg  LV 101/13 mmHg  AO 108/52 with a mean of 71 mmHg  Oxygen saturations:  PA 63%  AO 97%  Cardiac Output (Fick) 4.3 L per minute  Cardiac Index (Fick) 2.0 L per minute per meter square   Discharged yesterday on amiodarone, short-acting beta-blocker, Xarelto and Plavix with plan for DC-CV this week. He was not placed on home diuretics.  Presents back to day with DOE, orthopnea, abdominal bloating. In ER BNP > 9,000. Cr up 1.27->1.47.Trop 0.05   Review of Systems:     Cardiac Review of Systems: {Y] = yes [ ]  = no  Chest Pain [    ]  Resting SOB [ y  ] Exertional SOB  Cove.Etienne  ]  Orthopnea [ y ]   Pedal Edema Cove.Etienne   ]    Palpitations [  ] Syncope  [  ]   Presyncope [   ]  General Review of Systems: [Y] = yes [  ]=no Constitional: recent weight change [ y ]; anorexia [  ]; fatigue [ y ]; nausea [  ]; night sweats [  ]; fever [  ]; or chills [  ];                                                                                                                                          Eye : blurred vision [  ]; diplopia [   ]; vision changes [  ];  Amaurosis fugax[  ]; Resp: cough [  ];  wheezing[  ];  hemoptysis[  ]; shortness of breath[  ]; paroxysmal nocturnal dyspnea[  ]; dyspnea on exertion[  ]; or orthopnea[ y ];  GI:  gallstones[  ], vomiting[  ];  dysphagia[  ]; melena[  ];   hematochezia [  ]; heartburn[  ];   GU: kidney stones [  ]; hematuria[  ];   dysuria [  ];  nocturia[  ];  history of     obstruction [  ];                 Skin: rash, swelling[  ];, hair loss[  ];  peripheral edema[  ];  or itching[  ]; Musculosketetal: myalgias[  ];  joint swelling[  ];  joint erythema[  ];  joint pain[  ];  back pain[  ];  Heme/Lymph: bruising[  ];  bleeding[  ];  anemia[  ];  Neuro: TIA[  ];  headaches[  ];  stroke[  ];  vertigo[  ];  seizures[  ];   paresthesias[  ];  difficulty walking[  ];  Psych:depression[  ]; Kendell Bane  ];  Endocrine: diabetes[  ];  thyroid dysfunction[  ];  Other:  Past Medical History  Diagnosis Date  . Coronary artery disease   . Other and unspecified hyperlipidemia   . Hypertension   . Atrial fibrillation   . Sinoatrial node dysfunction   . PVD (peripheral vascular disease)     s/p renal artery stent 20-04  . Pacemaker st Jude   . AAA (abdominal aortic aneurysm)     repaired  . Systolic congestive heart failure   . Cardiomyopathy      (Not in a hospital admission)   . furosemide  80 mg Intravenous Once  . metolazone  2.5 mg Oral Once    Infusions:    Allergies  Allergen Reactions  . Codeine     "tripped out on it"  . Morphine     "tripped out on it"    History   Social History  . Marital Status: Married    Spouse Name: N/A    Number of Children: N/A  . Years of Education: N/A   Occupational History  . Not on file.   Social History Main Topics  . Smoking status: Former Games developer  . Smokeless tobacco: Former Neurosurgeon    Types: Chew  . Alcohol Use: No  . Drug Use: No  . Sexually Active: Not on file   Other Topics Concern  . Not on file   Social History Narrative  . No narrative on file    Family History  Problem Relation Age of Onset  . CAD Father   . CAD Brother   . Hypertension Father   . Hypertension Brother   . Diabetes Brother   . Lung cancer Sister     PHYSICAL EXAM: Filed Vitals:    09/20/12 0019  BP: 117/79  Resp: 18   General:  Elderly. Sitting upright. No respiratory difficulty HEENT: normal Neck: supple. no JVD 9-10. Carotids 2+ bilat; no bruits. No lymphadenopathy or thryomegaly appreciated. Cor: PMI laterally displaced. Irregular rate & rhythm. 2/6 MR Lungs: fine bibasilar crackles Abdomen: soft, nontender, +distended. No hepatosplenomegaly. No bruits or masses. Good bowel sounds. Extremities: no cyanosis, clubbing, rash, 2+ edema Neuro: alert & oriented x 3, cranial nerves grossly intact. moves all 4 extremities w/o difficulty. Affect pleasant.  ECG: AF 79 with v-pacing and PVCs  Results for orders placed during the hospital encounter of 09/19/12 (from the past 24 hour(s))  CBC     Status: None   Collection Time    09/19/12  9:59 PM      Result Value Range   WBC 8.6  4.0 - 10.5 K/uL   RBC 4.65  4.22 - 5.81 MIL/uL   Hemoglobin 14.5  13.0 - 17.0 g/dL   HCT 16.1  09.6 - 04.5 %   MCV 94.6  78.0 - 100.0 fL   MCH 31.2  26.0 - 34.0 pg   MCHC 33.0  30.0 - 36.0 g/dL   RDW 40.9  81.1 - 91.4 %   Platelets 151  150 - 400  K/uL  PRO B NATRIURETIC PEPTIDE     Status: Abnormal   Collection Time    09/19/12  9:59 PM      Result Value Range   Pro B Natriuretic peptide (BNP) 9116.0 (*) 0 - 450 pg/mL  BASIC METABOLIC PANEL     Status: Abnormal   Collection Time    09/19/12  9:59 PM      Result Value Range   Sodium 131 (*) 135 - 145 mEq/L   Potassium 4.8  3.5 - 5.1 mEq/L   Chloride 94 (*) 96 - 112 mEq/L   CO2 27  19 - 32 mEq/L   Glucose, Bld 132 (*) 70 - 99 mg/dL   BUN 39 (*) 6 - 23 mg/dL   Creatinine, Ser 9.56 (*) 0.50 - 1.35 mg/dL   Calcium 9.3  8.4 - 21.3 mg/dL   GFR calc non Af Amer 44 (*) >90 mL/min   GFR calc Af Amer 51 (*) >90 mL/min  POCT I-STAT TROPONIN I     Status: None   Collection Time    09/19/12 10:07 PM      Result Value Range   Troponin i, poc 0.05  0.00 - 0.08 ng/mL   Comment 3            Dg Chest Port 1 View  09/19/2012   *RADIOLOGY  REPORT*  Clinical Data: Shortness of breath.  Chest pain.  Anxiety for 2 days.  PORTABLE CHEST - 1 VIEW  Comparison: 09/12/2012.  Findings: Shallow inspiration.  There is increasing infiltration or atelectasis in both lungs since the previous study.  There is blunting of the left costophrenic angle suggesting a small effusion.  Cardiac enlargement without pulmonary vascular congestion.  No pneumothorax.  Calcified and tortuous aorta. Stable appearance of cardiac pacemaker.  IMPRESSION: Increasing infiltration or atelectasis in both lung bases with small developing left pleural effusion.   Original Report Authenticated By: Burman Nieves, M.D.     ASSESSMENT: 1) A/C systolic HF     --EF 25-30% 2) AF, rate controlled     --pending DC-CV 3) CAD s/p stent to Ramus last week 4) A/C renal failure   PLAN/DISCUSSION:  He has significant volume overload on exam but RHC last week was not that bad.  Will try to diurese in the ER with lasix 80 IV and metolazone 2.5mg  x 1. If feeling better and diureses > 2L then may be able to go home on lasix 80 mg daily with close outpatient f/u. If not diuresing well will need admission. I suspect AF and RV pacing (and possibly b-blockade) are exacerbating his HF.   His cardiomyopathy seems out of proportion to his CAD and with biventricular dysfunction raises the question of tachy-induced cardiomyopathy, but Dr. Odessa Fleming interrogation of device showed HRs not to be overly elevated making this less likely. He is apparently pending DC-CV next week with Dr. Ladona Ridgel.   Jamecia Lerman,MD 12:36 AM  Addendum: Patient diuresed ~ 2L in ER. Feeling better and was d/c'd home on lasix 80mg  daily. Will need f/u this week in the office for check on volume status and renal function. I have left message for office to arrange.   Leibish Mcgregor,MD 12:57 PM

## 2012-09-20 NOTE — ED Provider Notes (Signed)
Patient signed out to me to followup on his diuresis after being medicated. Patient was recently hospitalized for congestive heart failure has not been taking Lasix is being discharged yesterday. Patient has evidence of mild volume overload. Cardiology recommended to administer medication here in the ER and he put out close to 2 L of urine he can be discharged home safely in followup in the office. Patient has now put out 1800 mL of urine while being observed here in the ER and continues to do well. He'll be discharged with the original plan of Lasix 80 milligrams daily, followup with cardiology in the office.  Gilda Crease, MD 09/20/12 [phone]

## 2012-09-22 ENCOUNTER — Other Ambulatory Visit: Payer: Self-pay | Admitting: *Deleted

## 2012-09-22 ENCOUNTER — Telehealth: Payer: Self-pay | Admitting: Internal Medicine

## 2012-09-22 DIAGNOSIS — I4891 Unspecified atrial fibrillation: Secondary | ICD-10-CM

## 2012-09-22 NOTE — Telephone Encounter (Signed)
Per hospital documentation this pt should be scheduled for TEE/DCCV this week.  I will arrange procedure and contact pt with instructions.

## 2012-09-22 NOTE — Telephone Encounter (Signed)
New problem  Pt's wife said her husband is supposed to have his heart shocked today or tomorrow. She wants to know where she needs to take him.

## 2012-09-22 NOTE — Telephone Encounter (Signed)
Pt scheduled for TEE/DCCV on July 8th at 1:15 with Dr Jens Som. Pt's wife aware of pre-procedure instructions.

## 2012-09-23 ENCOUNTER — Encounter (HOSPITAL_COMMUNITY): Payer: Self-pay | Admitting: *Deleted

## 2012-09-23 ENCOUNTER — Encounter (HOSPITAL_COMMUNITY)
Admission: RE | Disposition: A | Discharge status: Home / Self Care | Payer: Self-pay | Source: Ambulatory Visit | Attending: Cardiology

## 2012-09-23 ENCOUNTER — Ambulatory Visit (HOSPITAL_COMMUNITY)
Admission: RE | Admit: 2012-09-23 | Discharge: 2012-09-23 | Disposition: A | Discharge status: Home / Self Care | Payer: Medicare Other | Source: Ambulatory Visit | Attending: Cardiology | Admitting: Cardiology

## 2012-09-23 ENCOUNTER — Ambulatory Visit (HOSPITAL_COMMUNITY): Payer: Medicare Other | Admitting: Anesthesiology

## 2012-09-23 ENCOUNTER — Encounter (HOSPITAL_COMMUNITY): Payer: Self-pay | Admitting: Anesthesiology

## 2012-09-23 DIAGNOSIS — I080 Rheumatic disorders of both mitral and aortic valves: Secondary | ICD-10-CM | POA: Insufficient documentation

## 2012-09-23 DIAGNOSIS — I714 Abdominal aortic aneurysm, without rupture, unspecified: Secondary | ICD-10-CM | POA: Insufficient documentation

## 2012-09-23 DIAGNOSIS — I509 Heart failure, unspecified: Secondary | ICD-10-CM | POA: Insufficient documentation

## 2012-09-23 DIAGNOSIS — Z87891 Personal history of nicotine dependence: Secondary | ICD-10-CM | POA: Insufficient documentation

## 2012-09-23 DIAGNOSIS — I079 Rheumatic tricuspid valve disease, unspecified: Secondary | ICD-10-CM | POA: Insufficient documentation

## 2012-09-23 DIAGNOSIS — I4891 Unspecified atrial fibrillation: Secondary | ICD-10-CM | POA: Insufficient documentation

## 2012-09-23 DIAGNOSIS — I2510 Atherosclerotic heart disease of native coronary artery without angina pectoris: Secondary | ICD-10-CM | POA: Insufficient documentation

## 2012-09-23 DIAGNOSIS — Z9861 Coronary angioplasty status: Secondary | ICD-10-CM | POA: Insufficient documentation

## 2012-09-23 DIAGNOSIS — E785 Hyperlipidemia, unspecified: Secondary | ICD-10-CM | POA: Insufficient documentation

## 2012-09-23 DIAGNOSIS — Z950 Presence of cardiac pacemaker: Secondary | ICD-10-CM | POA: Insufficient documentation

## 2012-09-23 DIAGNOSIS — I428 Other cardiomyopathies: Secondary | ICD-10-CM | POA: Insufficient documentation

## 2012-09-23 DIAGNOSIS — Z885 Allergy status to narcotic agent status: Secondary | ICD-10-CM | POA: Insufficient documentation

## 2012-09-23 DIAGNOSIS — I10 Essential (primary) hypertension: Secondary | ICD-10-CM | POA: Insufficient documentation

## 2012-09-23 DIAGNOSIS — I739 Peripheral vascular disease, unspecified: Secondary | ICD-10-CM | POA: Insufficient documentation

## 2012-09-23 DIAGNOSIS — I5022 Chronic systolic (congestive) heart failure: Secondary | ICD-10-CM | POA: Insufficient documentation

## 2012-09-23 DIAGNOSIS — Z79899 Other long term (current) drug therapy: Secondary | ICD-10-CM | POA: Insufficient documentation

## 2012-09-23 DIAGNOSIS — I495 Sick sinus syndrome: Secondary | ICD-10-CM | POA: Insufficient documentation

## 2012-09-23 HISTORY — PX: CARDIOVERSION: SHX1299

## 2012-09-23 HISTORY — PX: TEE WITHOUT CARDIOVERSION: SHX5443

## 2012-09-23 SURGERY — ECHOCARDIOGRAM, TRANSESOPHAGEAL
Anesthesia: Moderate Sedation

## 2012-09-23 MED ORDER — FENTANYL CITRATE 0.05 MG/ML IJ SOLN
INTRAMUSCULAR | Status: AC
  Filled 2012-09-23: qty 6

## 2012-09-23 MED ORDER — BUTAMBEN-TETRACAINE-BENZOCAINE 2-2-14 % EX AERO
INHALATION_SPRAY | CUTANEOUS | Status: DC | PRN
  Administered 2012-09-23: 2 via TOPICAL

## 2012-09-23 MED ORDER — DIPHENHYDRAMINE HCL 50 MG/ML IJ SOLN
INTRAMUSCULAR | Status: AC
  Filled 2012-09-23: qty 1

## 2012-09-23 MED ORDER — FENTANYL CITRATE 0.05 MG/ML IJ SOLN
INTRAMUSCULAR | Status: DC | PRN
  Administered 2012-09-23: 25 ug via INTRAVENOUS

## 2012-09-23 MED ORDER — FENTANYL CITRATE 0.05 MG/ML IJ SOLN
INTRAMUSCULAR | Status: AC
  Filled 2012-09-23: qty 2

## 2012-09-23 MED ORDER — SODIUM CHLORIDE 0.9 % IV SOLN
INTRAVENOUS | Status: DC
  Administered 2012-09-23: 13:00:00 via INTRAVENOUS

## 2012-09-23 MED ORDER — MIDAZOLAM HCL 5 MG/ML IJ SOLN
INTRAMUSCULAR | Status: AC
  Filled 2012-09-23: qty 3

## 2012-09-23 MED ORDER — MIDAZOLAM HCL 10 MG/2ML IJ SOLN
INTRAMUSCULAR | Status: DC | PRN
  Administered 2012-09-23 (×2): 2 mg via INTRAVENOUS

## 2012-09-23 MED ORDER — SODIUM CHLORIDE 0.9 % IV SOLN
INTRAVENOUS | Status: DC | PRN
  Administered 2012-09-23: 13:00:00 via INTRAVENOUS

## 2012-09-23 MED ORDER — LIDOCAINE HCL (CARDIAC) 20 MG/ML IV SOLN
INTRAVENOUS | Status: DC | PRN
  Administered 2012-09-23: 60 mg via INTRAVENOUS

## 2012-09-23 MED ORDER — PROPOFOL 10 MG/ML IV BOLUS
INTRAVENOUS | Status: DC | PRN
  Administered 2012-09-23: 40 mg via INTRAVENOUS

## 2012-09-23 MED ORDER — MIDAZOLAM HCL 5 MG/ML IJ SOLN
INTRAMUSCULAR | Status: AC
  Filled 2012-09-23: qty 1

## 2012-09-23 NOTE — Interval H&P Note (Signed)
History and Physical Interval Note:  09/23/2012 1:37 PM  Trevor Simpson  has presented today for surgery, with the diagnosis of a fib  The various methods of treatment have been discussed with the patient and family. After consideration of risks, benefits and other options for treatment, the patient has consented to  Procedure(s): TRANSESOPHAGEAL ECHOCARDIOGRAM (TEE) (N/A) CARDIOVERSION (N/A) as a surgical intervention .  The patient's history has been reviewed, patient examined, no change in status, stable for surgery.  I have reviewed the patient's chart and labs.  Questions were answered to the patient's satisfaction.     Olga Millers

## 2012-09-23 NOTE — Preoperative (Signed)
Beta Blockers   Reason not to administer Beta Blockers:Not Applicable 

## 2012-09-23 NOTE — Transfer of Care (Signed)
Immediate Anesthesia Transfer of Care Note  Patient: Trevor Simpson  Procedure(s) Performed: Procedure(s): TRANSESOPHAGEAL ECHOCARDIOGRAM (TEE) (N/A) CARDIOVERSION (N/A)  Patient Location: Endoscopy Unit  Anesthesia Type:MAC  Level of Consciousness: awake, alert  and oriented  Airway & Oxygen Therapy: Patient Spontanous Breathing and Patient connected to nasal cannula oxygen  Post-op Assessment: Report given to PACU RN  Post vital signs: Reviewed and stable  Complications: No apparent anesthesia complications

## 2012-09-23 NOTE — H&P (Signed)
Patient Information    Patient Name Sex DOB SSN    Trevor Simpson Male Jan 26, 1934 [id number]       Consult Note signed by Trevor Patty, MD at 09/20/2012 12:58 PM    Author: Dolores Patty, MD Service: Cardiology Author Type: Physician    Filed: 09/20/2012 12:58 PM Note Time: 09/20/2012 12:23 AM        Related Notes: Original Note by Trevor Patty, MD filed at 09/20/2012 12:57 PM          Referring Physician: ED Primary Cardiologist: GT Reason for Consultation: CHF     HPI:   Trevor Simpson is a 79 y.o. male with a history of CAD, PAF, PAD, SSS s/p prior pacemaker implantation. Admitted last week with acute HF in the setting of recurrent AF with mildly elevated rates. EF found to be newly decreased to 25-30% with significant RV dysfunctiona s well.    He was diuresed and underwent R&L HC with findings of chronically occluded RCA and 90% ramus lesion which was stented. RHC as below   RA 12/14 with a mean of 11 mmHg   RV 38/11 mmHg   PA 41/23 with a mean of 29 mmHg   PCWP 20 over 20 with a mean of 15 mmHg   LV 101/13 mmHg   AO 108/52 with a mean of 71 mmHg   Oxygen saturations:   PA 63%   AO 97%   Cardiac Output (Fick) 4.3 L per minute   Cardiac Index (Fick) 2.0 L per minute per meter square    Discharged yesterday on amiodarone, short-acting beta-blocker, Xarelto and Plavix with plan for DC-CV this week. He was not placed on home diuretics.   Presents back to day with DOE, orthopnea, abdominal bloating. In ER BNP > 9,000. Cr up 1.27->1.47.Trop 0.05     Review of Systems:                Cardiac Review of Systems: {Y] = yes [ ]  = no             Chest Pain [    ]           Resting SOB [ y  ]      Exertional SOB  Cove.Etienne  ]   Orthopnea [ y ]             Pedal Edema Cove.Etienne   ]      Palpitations [  ]            Syncope  [  ]   Presyncope [   ]             General Review of Systems: [Y] = yes [  ]=no Constitional: recent weight change [ y ]; anorexia [  ];  fatigue [ y ]; nausea [  ]; night sweats [  ]; fever [  ]; or chills [  ];  Eye : blurred vision [  ]; diplopia [   ]; vision changes [  ];  Amaurosis fugax[  ]; Resp: cough [  ];  wheezing[  ];  hemoptysis[  ]; shortness of breath[  ]; paroxysmal nocturnal dyspnea[  ]; dyspnea on exertion[  ]; or orthopnea[ y ];   GI:  gallstones[  ], vomiting[  ];  dysphagia[  ]; melena[  ];  hematochezia [  ]; heartburn[  ];   GU: kidney stones [  ]; hematuria[  ];   dysuria [  ];  nocturia[  ];  history of     obstruction [  ];                 Skin: rash, swelling[  ];, hair loss[  ];  peripheral edema[  ];  or itching[  ]; Musculosketetal: myalgias[  ];  joint swelling[  ];  joint erythema[  ];  joint pain[  ];  back pain[  ];             Heme/Lymph: bruising[  ];  bleeding[  ];  anemia[  ];   Neuro: TIA[  ];  headaches[  ];  stroke[  ];  vertigo[  ];  seizures[  ];   paresthesias[  ];  difficulty walking[  ];             Psych:depression[  ]; Trevor Simpson  ];             Endocrine: diabetes[  ];  thyroid dysfunction[  ];             Other:    Past Medical History   Diagnosis  Date   .  Coronary artery disease     .  Other and unspecified hyperlipidemia     .  Hypertension     .  Atrial fibrillation     .  Sinoatrial node dysfunction     .  PVD (peripheral vascular disease)         s/p renal artery stent 20-04   .  Pacemaker st Jude     .  AAA (abdominal aortic aneurysm)         repaired   .  Systolic congestive heart failure     .  Cardiomyopathy            (Not in a hospital admission)      .  furosemide   80 mg  Intravenous  Once   .  metolazone   2.5 mg  Oral  Once        Infusions:      Allergies   Allergen  Reactions   .  Codeine         "tripped out on it"   .  Morphine         "tripped out on it"         History        Social History   .  Marital Status:  Married       Spouse Name:  N/A       Number of Children:  N/A   .  Years of Education:  N/A       Occupational History   .  Not on file.       Social History Main Topics   .  Smoking status:  Former Games developer   .  Smokeless tobacco:  Former Neurosurgeon       Types:  Chew   .  Alcohol Use:  No   .  Drug Use:  No   .  Sexually Active:  Not on file       Other Topics  Concern   .  Not on file       Social History Narrative   .  No narrative on file         Family History   Problem  Relation  Age of Onset   .  CAD  Father     .  CAD  Brother     .  Hypertension  Father     .  Hypertension  Brother     .  Diabetes  Brother     .  Lung cancer  Sister          PHYSICAL EXAM: Filed Vitals:     09/20/12 0019   BP:  117/79   Resp:  18      General:  Elderly. Sitting upright. No respiratory difficulty HEENT: normal Neck: supple. no JVD 9-10. Carotids 2+ bilat; no bruits. No lymphadenopathy or thryomegaly appreciated. Cor: PMI laterally displaced. Irregular rate & rhythm. 2/6 MR Lungs: fine bibasilar crackles Abdomen: soft, nontender, +distended. No hepatosplenomegaly. No bruits or masses. Good bowel sounds. Extremities: no cyanosis, clubbing, rash, 2+ edema Neuro: alert & oriented x 3, cranial nerves grossly intact. moves all 4 extremities w/o difficulty. Affect pleasant.   ECG: AF 79 with v-pacing and PVCs    Results for orders placed during the hospital encounter of 09/19/12 (from the past 24 hour(s))   CBC     Status: None     Collection Time      09/19/12  9:59 PM       Result  Value  Range     WBC  8.6   4.0 - 10.5 K/uL     RBC  4.65   4.22 - 5.81 MIL/uL     Hemoglobin  14.5   13.0 - 17.0 g/dL     HCT  16.1   09.6 - 52.0 %     MCV  94.6   78.0 - 100.0 fL     MCH  31.2   26.0 - 34.0 pg     MCHC  33.0   30.0 - 36.0 g/dL     RDW  04.5   40.9 - 15.5 %     Platelets  151   150 - 400 K/uL   PRO B NATRIURETIC PEPTIDE      Status: Abnormal     Collection Time      09/19/12  9:59 PM       Result  Value  Range     Pro B Natriuretic peptide (BNP)  9116.0 (*)  0 - 450 pg/mL   BASIC METABOLIC PANEL     Status: Abnormal     Collection Time      09/19/12  9:59 PM       Result  Value  Range     Sodium  131 (*)  135 - 145 mEq/L     Potassium  4.8   3.5 - 5.1 mEq/L     Chloride  94 (*)  96 - 112 mEq/L     CO2  27   19 - 32 mEq/L     Glucose, Bld  132 (*)  70 - 99 mg/dL     BUN  39 (*)  6 - 23 mg/dL     Creatinine, Ser  8.11 (*)  0.50 - 1.35 mg/dL     Calcium  9.3   8.4 - 10.5 mg/dL     GFR calc non Af Amer  44 (*)  >90 mL/min     GFR calc Af Amer  51 (*)  >90 mL/min   POCT I-STAT TROPONIN I     Status: None     Collection Time      09/19/12 10:07 PM       Result  Value  Range     Troponin i, poc  0.05   0.00 - 0.08 ng/mL     Comment 3                 Dg Chest Port 1 View   09/19/2012   *RADIOLOGY REPORT*  Clinical Data: Shortness of breath.  Chest pain.  Anxiety for 2 days.  PORTABLE CHEST - 1 VIEW  Comparison: 09/12/2012.  Findings: Shallow inspiration.  There is increasing infiltration or atelectasis in both lungs since the previous study.  There is blunting of the left costophrenic angle suggesting a small effusion.  Cardiac enlargement without pulmonary vascular congestion.  No pneumothorax.  Calcified and tortuous aorta. Stable appearance of cardiac pacemaker.  IMPRESSION: Increasing infiltration or atelectasis in both lung bases with small developing left pleural effusion.   Original Report Authenticated By: Burman Nieves, M.D.         ASSESSMENT: 1) A/C systolic HF     --EF 25-30% 2) AF, rate controlled     --pending DC-CV 3) CAD s/p stent to Ramus last week 4) A/C renal failure     PLAN/DISCUSSION:   He has significant volume overload on exam but RHC last week was not that bad.  Will try to diurese in the ER with lasix 80 IV and metolazone 2.5mg  x 1. If feeling better and diureses > 2L  then may be able to go home on lasix 80 mg daily with close outpatient f/u. If not diuresing well will need admission. I suspect AF and RV pacing (and possibly b-blockade) are exacerbating his HF.    His cardiomyopathy seems out of proportion to his CAD and with biventricular dysfunction raises the question of tachy-induced cardiomyopathy, but Dr. Odessa Fleming interrogation of device showed HRs not to be overly elevated making this less likely. He is apparently pending DC-CV next week with Dr. Ladona Ridgel.    Daniel Bensimhon,MD 12:36 AM   Addendum: Patient diuresed ~ 2L in ER. Feeling better and was d/c'd home on lasix 80mg  daily. Will need f/u this week in the office for check on volume status and renal function. I have left message for office to arrange.    Daniel Bensimhon,MD 12:57 PM   For TEE guided DCCV. No changes Olga Millers

## 2012-09-23 NOTE — Anesthesia Preprocedure Evaluation (Addendum)
Anesthesia Evaluation  Patient identified by MRN, date of birth, ID band Patient awake    Reviewed: Allergy & Precautions, H&P , NPO status , Patient's Chart, lab work & pertinent test results, reviewed documented beta blocker date and time   Airway Mallampati: II TM Distance: >3 FB Neck ROM: Full    Dental  (+) Teeth Intact, Chipped, Poor Dentition and Missing   Pulmonary shortness of breath,          Cardiovascular hypertension, Pt. on home beta blockers and Pt. on medications + angina + CAD, + Peripheral Vascular Disease and +CHF + dysrhythmias Atrial Fibrillation + pacemaker Rhythm:Irregular     Neuro/Psych Anxiety negative neurological ROS     GI/Hepatic   Endo/Other  negative endocrine ROS  Renal/GU      Musculoskeletal   Abdominal   Peds  Hematology negative hematology ROS (+)   Anesthesia Other Findings   Reproductive/Obstetrics                         Anesthesia Physical Anesthesia Plan  ASA: III  Anesthesia Plan: General   Post-op Pain Management:    Induction: Intravenous  Airway Management Planned: Mask  Additional Equipment:   Intra-op Plan:   Post-operative Plan:   Informed Consent: I have reviewed the patients History and Physical, chart, labs and discussed the procedure including the risks, benefits and alternatives for the proposed anesthesia with the patient or authorized representative who has indicated his/her understanding and acceptance.   Dental advisory given  Plan Discussed with: CRNA, Anesthesiologist and Surgeon  Anesthesia Plan Comments:        Anesthesia Quick Evaluation

## 2012-09-23 NOTE — CV Procedure (Signed)
See full TEE report in [employer]; no LAA thrombus identified. Patient subsequently sedated by anesthesia with diprovan 40 mg and lidocaine 60 mg IV. Synchronized DCCV with 120 joules resulted in atrial paced rhythm. No immediate complications; continue xeralto. Olga Millers

## 2012-09-23 NOTE — Anesthesia Postprocedure Evaluation (Signed)
  Anesthesia Post-op Note  Patient: Trevor Simpson  Procedure(s) Performed: Procedure(s): TRANSESOPHAGEAL ECHOCARDIOGRAM (TEE) (N/A) CARDIOVERSION (N/A)  Patient Location: Endoscopy Unit  Anesthesia Type:MAC  Level of Consciousness: awake, alert  and oriented  Airway and Oxygen Therapy: Patient Spontanous Breathing and Patient connected to nasal cannula oxygen  Post-op Pain: none  Post-op Assessment: Post-op Vital signs reviewed, Patient's Cardiovascular Status Stable, Respiratory Function Stable, Patent Airway, No signs of Nausea or vomiting, Adequate PO intake and Pain level controlled  Post-op Vital Signs: Reviewed and stable  Complications: No apparent anesthesia complications

## 2012-09-23 NOTE — Progress Notes (Signed)
  Echocardiogram Echocardiogram Transesophageal has been performed.  Macio Kissoon 09/23/2012, 2:15 PM

## 2012-09-24 ENCOUNTER — Encounter (HOSPITAL_COMMUNITY): Payer: Self-pay | Admitting: Cardiology

## 2012-09-25 ENCOUNTER — Encounter (HOSPITAL_COMMUNITY): Payer: Self-pay | Admitting: Cardiology

## 2012-09-25 ENCOUNTER — Emergency Department (HOSPITAL_COMMUNITY): Payer: Medicare Other

## 2012-09-25 ENCOUNTER — Emergency Department (HOSPITAL_COMMUNITY)
Admission: EM | Admit: 2012-09-25 | Discharge: 2012-09-26 | Disposition: A | Discharge status: Home / Self Care | Payer: Medicare Other | Attending: Emergency Medicine | Admitting: Emergency Medicine

## 2012-09-25 ENCOUNTER — Telehealth: Payer: Self-pay | Admitting: Internal Medicine

## 2012-09-25 DIAGNOSIS — Z87891 Personal history of nicotine dependence: Secondary | ICD-10-CM | POA: Insufficient documentation

## 2012-09-25 DIAGNOSIS — I10 Essential (primary) hypertension: Secondary | ICD-10-CM | POA: Insufficient documentation

## 2012-09-25 DIAGNOSIS — R609 Edema, unspecified: Secondary | ICD-10-CM | POA: Insufficient documentation

## 2012-09-25 DIAGNOSIS — E785 Hyperlipidemia, unspecified: Secondary | ICD-10-CM | POA: Insufficient documentation

## 2012-09-25 DIAGNOSIS — R0989 Other specified symptoms and signs involving the circulatory and respiratory systems: Secondary | ICD-10-CM | POA: Insufficient documentation

## 2012-09-25 DIAGNOSIS — Z79899 Other long term (current) drug therapy: Secondary | ICD-10-CM | POA: Insufficient documentation

## 2012-09-25 DIAGNOSIS — R635 Abnormal weight gain: Secondary | ICD-10-CM | POA: Insufficient documentation

## 2012-09-25 DIAGNOSIS — I4891 Unspecified atrial fibrillation: Secondary | ICD-10-CM | POA: Insufficient documentation

## 2012-09-25 DIAGNOSIS — R0602 Shortness of breath: Secondary | ICD-10-CM

## 2012-09-25 DIAGNOSIS — I509 Heart failure, unspecified: Secondary | ICD-10-CM

## 2012-09-25 DIAGNOSIS — I739 Peripheral vascular disease, unspecified: Secondary | ICD-10-CM | POA: Insufficient documentation

## 2012-09-25 DIAGNOSIS — I428 Other cardiomyopathies: Secondary | ICD-10-CM | POA: Insufficient documentation

## 2012-09-25 DIAGNOSIS — I5020 Unspecified systolic (congestive) heart failure: Secondary | ICD-10-CM | POA: Insufficient documentation

## 2012-09-25 DIAGNOSIS — I5021 Acute systolic (congestive) heart failure: Secondary | ICD-10-CM

## 2012-09-25 DIAGNOSIS — R011 Cardiac murmur, unspecified: Secondary | ICD-10-CM | POA: Insufficient documentation

## 2012-09-25 DIAGNOSIS — I495 Sick sinus syndrome: Secondary | ICD-10-CM | POA: Insufficient documentation

## 2012-09-25 DIAGNOSIS — Z9861 Coronary angioplasty status: Secondary | ICD-10-CM | POA: Insufficient documentation

## 2012-09-25 DIAGNOSIS — I2510 Atherosclerotic heart disease of native coronary artery without angina pectoris: Secondary | ICD-10-CM | POA: Insufficient documentation

## 2012-09-25 DIAGNOSIS — Z86718 Personal history of other venous thrombosis and embolism: Secondary | ICD-10-CM | POA: Insufficient documentation

## 2012-09-25 DIAGNOSIS — Z950 Presence of cardiac pacemaker: Secondary | ICD-10-CM | POA: Insufficient documentation

## 2012-09-25 LAB — BASIC METABOLIC PANEL
Chloride: 90 mEq/L — ABNORMAL LOW (ref 96–112)
Creatinine, Ser: 1.47 mg/dL — ABNORMAL HIGH (ref 0.50–1.35)
GFR calc Af Amer: 51 mL/min — ABNORMAL LOW (ref >90)
GFR calc non Af Amer: 44 mL/min — ABNORMAL LOW (ref >90)

## 2012-09-25 LAB — CBC
MCV: 94.3 fL (ref 78.0–100.0)
Platelets: 149 10*3/uL — ABNORMAL LOW (ref 150–400)
RDW: 14.1 % (ref 11.5–15.5)
WBC: 8 10*3/uL (ref 4.0–10.5)

## 2012-09-25 LAB — POCT I-STAT TROPONIN I: Troponin i, poc: 0.01 ng/mL (ref 0.00–0.08)

## 2012-09-25 MED ORDER — POTASSIUM CHLORIDE CRYS ER 20 MEQ PO TBCR
40.0000 meq | EXTENDED_RELEASE_TABLET | Freq: Once | ORAL | Status: AC
  Administered 2012-09-25: 40 meq via ORAL
  Filled 2012-09-25: qty 2

## 2012-09-25 MED ORDER — POTASSIUM CHLORIDE CRYS ER 20 MEQ PO TBCR
20.0000 meq | EXTENDED_RELEASE_TABLET | Freq: Once | ORAL | Status: AC
  Administered 2012-09-25: 20 meq via ORAL
  Filled 2012-09-25: qty 1

## 2012-09-25 MED ORDER — METOLAZONE 2.5 MG PO TABS
2.5000 mg | ORAL_TABLET | ORAL | Status: AC
  Administered 2012-09-25: 2.5 mg via ORAL
  Filled 2012-09-25: qty 1

## 2012-09-25 MED ORDER — FUROSEMIDE 10 MG/ML IJ SOLN
80.0000 mg | Freq: Once | INTRAMUSCULAR | Status: AC
  Administered 2012-09-25: 80 mg via INTRAVENOUS
  Filled 2012-09-25: qty 8

## 2012-09-25 NOTE — Telephone Encounter (Signed)
New Prob      Pts wife states pt has gained 4 lbs since yesterday. Would like to speak to nurse regarding this.

## 2012-09-25 NOTE — ED Provider Notes (Signed)
History    CSN: [id number] Arrival date & time 09/25/12  1710  First MD Initiated Contact with Patient 09/25/12 1856     Chief Complaint  Patient presents with  . Edema   (Consider location/radiation/quality/duration/timing/severity/associated sxs/prior Treatment) Patient is a 79 y.o. male presenting with shortness of breath.  Shortness of Breath Severity:  Moderate Onset quality:  Gradual Duration:  3 days Timing:  Constant Progression:  Worsening Chronicity:  Recurrent Context: not activity, not animal exposure, not known allergens, not pollens, not smoke exposure and not strong odors   Relieved by:  Sitting up (partially) Exacerbated by: laying flat. Ineffective treatments:  None tried Associated symptoms: no abdominal pain, no chest pain, no claudication, no cough, no diaphoresis, no ear pain, no fever, no headaches, no hemoptysis, no neck pain, no rash, no sore throat, no sputum production, no swollen glands, no vomiting and no wheezing   Associated symptoms comment:  Weight gain, lower extremity edema Risk factors: no recent alcohol use, no hx of cancer and no oral contraceptive use    Past Medical History  Diagnosis Date  . Coronary artery disease   . Other and unspecified hyperlipidemia   . Hypertension   . Atrial fibrillation   . Sinoatrial node dysfunction   . PVD (peripheral vascular disease)     s/p renal artery stent 20-04  . Pacemaker st Jude   . AAA (abdominal aortic aneurysm)     repaired  . Systolic congestive heart failure   . Cardiomyopathy    Past Surgical History  Procedure Laterality Date  . Inguinal hernia repair      left  . Cardiac pacemaker placement  08/01/2009    st jude dual chamber  . Colonoscopy    . Esophagogastroduodenoscopy    . Translumminal angioplasty    . Tee without cardioversion N/A 09/23/2012    Procedure: TRANSESOPHAGEAL ECHOCARDIOGRAM (TEE);  Surgeon: Lewayne Bunting, MD;  Location: [facility] ENDOSCOPY;  Service:  Cardiovascular;  Laterality: N/A;  . Cardioversion N/A 09/23/2012    Procedure: CARDIOVERSION;  Surgeon: Lewayne Bunting, MD;  Location: [facility] ENDOSCOPY;  Service: Cardiovascular;  Laterality: N/A;   Family History  Problem Relation Age of Onset  . CAD Father   . CAD Brother   . Hypertension Father   . Hypertension Brother   . Diabetes Brother   . Lung cancer Sister    History  Substance Use Topics  . Smoking status: Former Smoker    Quit date: 03/19/1993  . Smokeless tobacco: Former Neurosurgeon    Types: Chew  . Alcohol Use: No    Review of Systems  Constitutional: Negative for fever, chills, diaphoresis, activity change, appetite change and fatigue.  HENT: Negative for ear pain, congestion, sore throat, rhinorrhea, sneezing, trouble swallowing and neck pain.   Eyes: Negative for pain and redness.  Respiratory: Positive for shortness of breath. Negative for cough, hemoptysis, sputum production, choking, chest tightness, wheezing and stridor.   Cardiovascular: Positive for leg swelling. Negative for chest pain and claudication.  Gastrointestinal: Negative for nausea, vomiting, abdominal pain, diarrhea, constipation, blood in stool, abdominal distention and anal bleeding.  Musculoskeletal: Negative for myalgias and back pain.  Skin: Negative for rash.  Neurological: Negative for dizziness, speech difficulty, weakness, light-headedness, numbness and headaches.  Hematological: Negative for adenopathy.  Psychiatric/Behavioral: Negative for confusion.    Allergies  Codeine and Morphine  Home Medications   Current Outpatient Rx  Name  Route  Sig  Dispense  Refill  . amiodarone (  PACERONE) 200 MG tablet   Oral   Take 1 tablet (200 mg total) by mouth daily.   30 tablet   11   . atorvastatin (LIPITOR) 20 MG tablet   Oral   Take 20 mg by mouth daily.           . B Complex-C (SUPER B COMPLEX) TABS   Oral   Take 1 tablet by mouth daily.          . calcium carbonate (OS-CAL) 600  MG TABS   Oral   Take 600 mg by mouth 2 (two) times daily with a meal.           . cetirizine (ZYRTEC) 10 MG tablet   Oral   Take 10 mg by mouth daily.          . Cholecalciferol 1000 UNITS capsule   Oral   Take 1,000 Units by mouth daily.           . clopidogrel (PLAVIX) 75 MG tablet   Oral   Take 1 tablet (75 mg total) by mouth daily with breakfast.   30 tablet   0   . diphenhydramine-acetaminophen (TYLENOL PM) 25-500 MG TABS   Oral   Take 1-1.5 tablets by mouth at bedtime as needed (sleep).         . fish oil-omega-3 fatty acids 1000 MG capsule   Oral   Take 1 g by mouth 2 (two) times daily.          . furosemide (LASIX) 80 MG tablet   Oral   Take 1 tablet (80 mg total) by mouth daily.   90 tablet   0   . levothyroxine (SYNTHROID, LEVOTHROID) 112 MCG tablet   Oral   Take 112 mcg by mouth daily.         . magnesium gluconate (MAGONATE) 500 MG tablet   Oral   Take 500 mg by mouth daily.          . metoprolol (LOPRESSOR) 50 MG tablet   Oral   Take 1 tablet (50 mg total) by mouth 2 (two) times daily.   60 tablet   11   . Multiple Vitamin (MULTIVITAMIN WITH MINERALS) TABS   Oral   Take 1 tablet by mouth daily.         . nitroGLYCERIN (NITROSTAT) 0.4 MG SL tablet   Sublingual   Place 1 tablet (0.4 mg total) under the tongue every 5 (five) minutes as needed for chest pain.   25 tablet   3   . omeprazole (PRILOSEC) 20 MG capsule   Oral   Take 20 mg by mouth daily as needed (indigestion).          . Rivaroxaban (XARELTO) 20 MG TABS   Oral   Take 20 mg by mouth every evening.         . Saw Palmetto, Serenoa repens, (SAW PALMETTO PO)   Oral   Take 1 capsule by mouth 2 (two) times daily.         . vitamin C (ASCORBIC ACID) 500 MG tablet   Oral   Take 500 mg by mouth daily.            BP 113/63  Pulse 109  Temp(Src) 97.4 F (36.3 C) (Oral)  SpO2 98% Physical Exam  Nursing note and vitals reviewed. Constitutional: He is  oriented to person, place, and time. He appears well-developed and well-nourished. No distress.  HENT:  Head: Normocephalic and  atraumatic.  Eyes: Conjunctivae and EOM are normal. Right eye exhibits no discharge. Left eye exhibits no discharge.  Neck: Normal range of motion. Neck supple. No tracheal deviation present.  Cardiovascular: Normal rate and regular rhythm.  Exam reveals no friction rub.   Murmur heard. 2/6 murmur at the right upper sternal border  Pulmonary/Chest: Effort normal. No stridor. Not tachypneic. No respiratory distress. He has no decreased breath sounds. He has no wheezes. He has rhonchi (Bilateral lung bases). He has no rales. He exhibits no tenderness.  Abdominal: Soft. He exhibits no distension. There is no tenderness. There is no rebound and no guarding.  Musculoskeletal:       Right lower leg: He exhibits edema (2+).       Left lower leg: He exhibits edema (2+).  Neurological: He is alert and oriented to person, place, and time.  Skin: Skin is warm.  Psychiatric: He has a normal mood and affect.    ED Course  Procedures (including critical care time) Labs Reviewed  CBC - Abnormal; Notable for the following:    Platelets 149 (*)    All other components within normal limits  BASIC METABOLIC PANEL - Abnormal; Notable for the following:    Sodium 132 (*)    Chloride 90 (*)    Glucose, Bld 167 (*)    BUN 38 (*)    Creatinine, Ser 1.47 (*)    GFR calc non Af Amer 44 (*)    GFR calc Af Amer 51 (*)    All other components within normal limits  PRO B NATRIURETIC PEPTIDE - Abnormal; Notable for the following:    Pro B Natriuretic peptide (BNP) 7086.0 (*)    All other components within normal limits  POCT I-STAT TROPONIN I   Dg Chest 2 View  09/25/2012   *RADIOLOGY REPORT*  Clinical Data: Cough.  Recent stent and thoracentesis.  CHEST - 2 VIEW  Comparison: Radiographs 09/19/2012.  CT 09/12/2010.  Findings: Left subclavian pacemaker leads are unchanged within the  right atrium and right ventricle.  Heart size and mediastinal contours are stable.  The pleural effusions have decreased in volume.  There is mild edema and bibasilar atelectasis.  No pneumothorax is evident.  IMPRESSION: Improved pleural effusions with residual mild edema and bibasilar atelectasis.  No new findings demonstrated.   Original Report Authenticated By: Carey Bullocks, M.D.   No diagnosis found. Results for orders placed during the hospital encounter of 09/25/12  CBC      Result Value Range   WBC 8.0  4.0 - 10.5 K/uL   RBC 4.71  4.22 - 5.81 MIL/uL   Hemoglobin 14.8  13.0 - 17.0 g/dL   HCT 30.8  65.7 - 84.6 %   MCV 94.3  78.0 - 100.0 fL   MCH 31.4  26.0 - 34.0 pg   MCHC 33.3  30.0 - 36.0 g/dL   RDW 96.2  95.2 - 84.1 %   Platelets 149 (*) 150 - 400 K/uL  BASIC METABOLIC PANEL      Result Value Range   Sodium 132 (*) 135 - 145 mEq/L   Potassium 3.5  3.5 - 5.1 mEq/L   Chloride 90 (*) 96 - 112 mEq/L   CO2 27  19 - 32 mEq/L   Glucose, Bld 167 (*) 70 - 99 mg/dL   BUN 38 (*) 6 - 23 mg/dL   Creatinine, Ser 3.24 (*) 0.50 - 1.35 mg/dL   Calcium 9.8  8.4 - 40.1 mg/dL  GFR calc non Af Amer 44 (*) >90 mL/min   GFR calc Af Amer 51 (*) >90 mL/min  PRO B NATRIURETIC PEPTIDE      Result Value Range   Pro B Natriuretic peptide (BNP) 7086.0 (*) 0 - 450 pg/mL  POCT I-STAT TROPONIN I      Result Value Range   Troponin i, poc 0.01  0.00 - 0.08 ng/mL   Comment 3             MDM  [name] 79 y.o. with severe CHF who presents emergency per for shortness of breath. He was also seen in the emergency department for similar symptoms approximately 5 days ago. Cardiology anticipated patient's arrival to the emergency department and were at bedside immediately. Respiratory status appears improved compared to recent ED visit. He's afebrile. No respiratory distress. Troponin negative. BNP elevated but improved from previous. Cardiology recommended diuresing the patient and evaluating him.  Patient was diuresed. He made appropriate amount urine. Reports improvement in symptoms. Patient's family felt comfortable going home. This is reasonable plan. As per cardiology, patient was given a prescription for torsemide 40 mg and was instructed to stop taking Lasix. Further cardiology recommendations shown below and conveyed to the patient and the family. There struck to the followup in the heart failure clinic in one week. I discussed this patient's care with my attending, Dr. Ranae Palms.  Cardiology recs -  increase amio to 400 mg daily, replete potassium and refer him back to the CHF clinic early next week for titration of his CHF meds. There is a question of BiV ICD upgrade and we will consider this at the time of his office visit to EP for device followup.   Sena Hitch, MD 09/26/12 [phone]

## 2012-09-25 NOTE — ED Notes (Signed)
Pt reports that he takes a fluid pill at home and has not been urinating that much at home. States that he has cardiac hx and a-fib. Denies any chest pain. Reports SOB, more with activity. Sleeps with multiple pillows at home. No distress noted at triage. States that he just had a stent placed last week and had a cardioversion.

## 2012-09-25 NOTE — Consult Note (Signed)
Cardiology Consult Note   Patient ID: Trevor Simpson MRN: [id number], DOB/AGE: 04-13-1933   Admit date: 09/25/2012 Date of Consult: 09/25/2012  Primary Physician: Trevor Grippe, Simpson Primary Cardiologist: Trevor Simpson  Reason for consult: CHF  HPI: Trevor Simpson is a 79 y.o. male with PMHx s/f CAD (cath 08/2012- RCA CTO, BMS-90% ramus), PAF (on Xarelto), sinus node dysfunction (s/p dual chamber St. Jude PPM), chronic systolic CHF, PAD (s/p renal artery 2004), HTN, HLD and AAA (s/p repair) who presents to the ED at the advice of our office for weight gain and shortness of breath.   He was seen in 09/20/12 for acute on chronic systolic CHF. This was suspected to be secondary to PAF, RV pacing +/- BB therapy. He was given Lasix 80mg  IV and metolazone 2.5mg  IV in the ED with ~ 2 L diuresed. He was discharged on Lasix 80mg  daily. He then underwent TEE/DCCV on 09/23/12. There was no evidence of thrombus, EF 25-30%. He was successfully cardioverted to an A paced rhythm. He reports significant output from the Lasix for the first two days, but then noticed his urinary output decreased. He reports associated 4 lbs weight gain, increased LE edema, orthopnea and DOE. He denied chest pain, palpitations, lightheadedness or syncope. He denied increased salt intake, fluid intake, fevers, chills, over exertion or increased BP at home. Does not wear compression stockings at home. Takes Lasix with a meal. He called the office with these symptoms and was advised to the present to the ED.   Here, EKG reveals an accelerated V paced rhythm at 110 bpm deemed to be due to atrial undersensing of his PPM from PAF by Dr. Ladona Simpson. Initial trop-I WNL. pBNP [id number]. BMET- Na 132, K 3.5, BUN 38/Cr 1.47 (unchanged from 7/4). CBC unremarkable. CXR with no interval change from 7/4 CXR- small bilateral pleural effusions, residual mild pulmonary edema vs atelectasis. Exam reveals mostly peripheral edema. He is currently in NAD.   Problem  List: Past Medical History  Diagnosis Date  . Coronary artery disease   . Other and unspecified hyperlipidemia   . Hypertension   . Atrial fibrillation   . Sinoatrial node dysfunction   . PVD (peripheral vascular disease)     s/p renal artery stent 20-04  . Pacemaker st Jude   . AAA (abdominal aortic aneurysm)     repaired  . Systolic congestive heart failure   . Cardiomyopathy     Past Surgical History  Procedure Laterality Date  . Inguinal hernia repair      left  . Cardiac pacemaker placement  08/01/2009    st jude dual chamber  . Colonoscopy    . Esophagogastroduodenoscopy    . Translumminal angioplasty    . Tee without cardioversion N/A 09/23/2012    Procedure: TRANSESOPHAGEAL ECHOCARDIOGRAM (TEE);  Surgeon: Trevor Bunting, Simpson;  Location: [facility] ENDOSCOPY;  Service: Cardiovascular;  Laterality: N/A;  . Cardioversion N/A 09/23/2012    Procedure: CARDIOVERSION;  Surgeon: Trevor Bunting, Simpson;  Location: [facility] ENDOSCOPY;  Service: Cardiovascular;  Laterality: N/A;     Allergies:  Allergies  Allergen Reactions  . Codeine     "tripped out on it"  . Morphine     "tripped out on it"    Home Medications: Prior to Admission medications   Medication Sig Start Date End Date Taking? Authorizing Provider  amiodarone (PACERONE) 200 MG tablet Take 1 tablet (200 mg total) by mouth daily. 09/18/12  Yes Trevor Jump, PA-C  atorvastatin (LIPITOR) 20 MG tablet Take 20 mg by mouth daily.     Yes Historical Provider, Simpson  B Complex-C (SUPER B COMPLEX) TABS Take 1 tablet by mouth daily.    Yes Historical Provider, Simpson  calcium carbonate (OS-CAL) 600 MG TABS Take 600 mg by mouth 2 (two) times daily with a meal.     Yes Historical Provider, Simpson  cetirizine (ZYRTEC) 10 MG tablet Take 10 mg by mouth daily.  08/24/10  Yes Historical Provider, Simpson  Cholecalciferol 1000 UNITS capsule Take 1,000 Units by mouth daily.     Yes Historical Provider, Simpson  clopidogrel (PLAVIX) 75 MG tablet Take 1 tablet (75 mg  total) by mouth daily with breakfast. 09/18/12  Yes Trevor G Barrett, PA-C  diphenhydramine-acetaminophen (TYLENOL PM) 25-500 MG TABS Take 1-1.5 tablets by mouth at bedtime as needed (sleep).   Yes Historical Provider, Simpson  fish oil-omega-3 fatty acids 1000 MG capsule Take 1 g by mouth 2 (two) times daily.    Yes Historical Provider, Simpson  furosemide (LASIX) 80 MG tablet Take 1 tablet (80 mg total) by mouth daily. 09/20/12  Yes Trevor Crease, Simpson  levothyroxine (SYNTHROID, LEVOTHROID) 112 MCG tablet Take 112 mcg by mouth daily.   Yes Historical Provider, Simpson  magnesium gluconate (MAGONATE) 500 MG tablet Take 500 mg by mouth daily.    Yes Historical Provider, Simpson  metoprolol (LOPRESSOR) 50 MG tablet Take 1 tablet (50 mg total) by mouth 2 (two) times daily. 09/18/12  Yes Trevor G Barrett, PA-C  Multiple Vitamin (MULTIVITAMIN WITH MINERALS) TABS Take 1 tablet by mouth daily.   Yes Historical Provider, Simpson  nitroGLYCERIN (NITROSTAT) 0.4 MG SL tablet Place 1 tablet (0.4 mg total) under the tongue every 5 (five) minutes as needed for chest pain. 01/15/12  Yes Trevor Maw, Simpson  omeprazole (PRILOSEC) 20 MG capsule Take 20 mg by mouth daily as needed (indigestion).    Yes Historical Provider, Simpson  Rivaroxaban (XARELTO) 20 MG TABS Take 20 mg by mouth every evening.   Yes Historical Provider, Simpson  Saw Palmetto, Serenoa repens, (SAW PALMETTO PO) Take 1 capsule by mouth 2 (two) times daily.   Yes Historical Provider, Simpson  vitamin C (ASCORBIC ACID) 500 MG tablet Take 500 mg by mouth daily.     Yes Historical Provider, Simpson    Inpatient Medications:  . potassium chloride  20 mEq Oral Once    (Not in a hospital admission)  Family History  Problem Relation Age of Onset  . CAD Father   . CAD Brother   . Hypertension Father   . Hypertension Brother   . Diabetes Brother   . Lung cancer Sister      History   Social History  . Marital Status: Married    Spouse Name: N/A    Number of Children: N/A  . Years of  Education: N/A   Occupational History  . Not on file.   Social History Main Topics  . Smoking status: Former Smoker    Quit date: 03/19/1993  . Smokeless tobacco: Former Neurosurgeon    Types: Chew  . Alcohol Use: No  . Drug Use: No  . Sexually Active: Not on file   Other Topics Concern  . Not on file   Social History Narrative  . No narrative on file     Review of Systems: General: negative for chills, fever, night sweats or weight changes.  Cardiovascular: positive for edema, dyspnea on exertion, PND, orthopnea and shortness  of breath, negative for chest pain, palpitations Dermatological: negative for rash Respiratory: negative for cough or wheezing Urologic: negative for hematuria Abdominal: negative for nausea, vomiting, diarrhea, bright red blood per rectum, melena, or hematemesis Neurologic: negative for visual changes, syncope, or dizziness All other systems reviewed and are otherwise negative except as noted above.  Physical Exam: Blood pressure 129/63, pulse 60, temperature 97.4 F (36.3 C), temperature source Oral, resp. rate 20, weight 92.534 kg (204 lb), SpO2 99.00%.    General: Elderly appearing male in NAD. Head: Normocephalic, atraumatic, sclera non-icteric, no xanthomas, nares are without discharge.  Neck: Negative for carotid bruits. JVP 3-4 cm.  Lungs: Clear bilaterally to auscultation without wheezes, rales, or rhonchi. Breathing is unlabored. Heart: RRR with S1 S2. No murmurs, rubs, or gallops appreciated. Abdomen: Soft, non-tender, distended, +HPJ with normoactive bowel sounds. No hepatomegaly. No rebound/guarding. No obvious abdominal masses. Msk:  Strength and tone appears normal for age. Extremities: 2+ bilateral pretibial pitting edema, no clubbing or cyanosis. Distal pedal pulses are 2+ and equal bilaterally. Neuro: Alert and oriented X 3. Moves all extremities spontaneously. Psych:  Responds to questions appropriately with a normal  affect.  Labs: Recent Labs     09/25/12  1720  WBC  8.0  HGB  14.8  HCT  44.4  MCV  94.3  PLT  149*   Recent Labs Lab 09/19/12 2159 09/25/12 1720  NA 131* 132*  K 4.8 3.5  CL 94* 90*  CO2 27 27  BUN 39* 38*  CREATININE 1.47* 1.47*  CALCIUM 9.3 9.8  GLUCOSE 132* 167*   Radiology/Studies: Dg Chest 2 View  09/25/2012   *RADIOLOGY REPORT*  Clinical Data: Cough.  Recent stent and thoracentesis.  CHEST - 2 VIEW  Comparison: Radiographs 09/19/2012.  CT 09/12/2010.  Findings: Left subclavian pacemaker leads are unchanged within the right atrium and right ventricle.  Heart size and mediastinal contours are stable.  The pleural effusions have decreased in volume.  There is mild edema and bibasilar atelectasis.  No pneumothorax is evident.  IMPRESSION: Improved pleural effusions with residual mild edema and bibasilar atelectasis.  No new findings demonstrated.   Original Report Authenticated By: Carey Bullocks, M.D.   Dg Chest Port 1 View  09/19/2012   *RADIOLOGY REPORT*  Clinical Data: Shortness of breath.  Chest pain.  Anxiety for 2 days.  PORTABLE CHEST - 1 VIEW  Comparison: 09/12/2012.  Findings: Shallow inspiration.  There is increasing infiltration or atelectasis in both lungs since the previous study.  There is blunting of the left costophrenic angle suggesting a small effusion.  Cardiac enlargement without pulmonary vascular congestion.  No pneumothorax.  Calcified and tortuous aorta. Stable appearance of cardiac pacemaker.  IMPRESSION: Increasing infiltration or atelectasis in both lung bases with small developing left pleural effusion.   Original Report Authenticated By: Burman Nieves, M.D.   EKG: V-paced, 110 bpm, LAD  ASSESSMENT AND PLAN:   See comprehensive plan below.  Signed, R. Hurman Horn, PA-C 09/25/2012, 7:55 PM  Cardiology attending  Patient seen and examined. Discussed the findings on physical exam, assessment and plan. The patient is well known to me with a  long standing ischemic CM, S/p recent PCI, Persistent atrial fibrillation, HTN, stage 2 renal insufficiency, and s/p ICD implant. He was hospitalized recently with worsening CHF, atrial fib and underwent cath and PCI. He has been seen in the ER with Dr. Dorthea Cove and diuresed. He gained 5 lbs over the past 24 hours and noted palpitations and was  found to have atrial fib with rapid ventricular pacing at the upper tracking rate. He has reverted spontaneously to NSR. He denies medical or dietary non-compliance. He has been on lasix 80 mg daily. I suspect that the patient has had worsening hemodynamics due to his atrial fib, resulting in volume overload and worsening sob. I suspect we will be able to diurese him in ED and avoid hospitalization, especially now that he has gone back to NSR with atrial pacing. Will give IV lasix and metolazone, increase amio to 400 mg daily, replete potassium and refer him back to the CHF clinic early next week for titration of his CHF meds. There is a question of BiV ICD upgrade and we will consider this at the time of his office visit to EP for device followup.  Leonia Reeves.D.

## 2012-09-25 NOTE — ED Notes (Addendum)
1900  Received report from off going Charity fundraiser.  Introduced self to the Pt.    2120  Pt moved to C POD Room 28.  Report given to the RN.  Pt is stable at this time

## 2012-09-25 NOTE — ED Notes (Signed)
Cardiology at bedside. Pt states he does not have appetite. Pt states he does not feel like himself. Pt states he has not been able to sleep due to not being able to breathe well. Pt states he has to sit up straight to breathe better. Pt alert and mentating appropriately. Speaking in clear sentences.

## 2012-09-25 NOTE — Telephone Encounter (Signed)
Pt feeling miserable, sob with activity, orthopneic, wt gain 4 lb, pt pulse range 81-110 bpm. Pt was cardioverted 09/23/12. Per DOD Dr Graciela Husbands pt is to return to ED. I called tony to inform.

## 2012-09-26 MED ORDER — TORSEMIDE 20 MG PO TABS: 40.0000 mg | ORAL_TABLET | Freq: Every day | ORAL | Status: DC

## 2012-09-27 ENCOUNTER — Inpatient Hospital Stay (HOSPITAL_COMMUNITY): Payer: Medicare Other

## 2012-09-27 ENCOUNTER — Emergency Department (HOSPITAL_COMMUNITY): Payer: Medicare Other

## 2012-09-27 ENCOUNTER — Inpatient Hospital Stay (HOSPITAL_COMMUNITY)
Admission: EM | Admit: 2012-09-27 | Discharge: 2012-09-30 | DRG: 065 | Disposition: A | Discharge status: Home Health | Payer: Medicare Other | Attending: Internal Medicine | Admitting: Internal Medicine

## 2012-09-27 ENCOUNTER — Encounter (HOSPITAL_COMMUNITY): Payer: Self-pay | Admitting: *Deleted

## 2012-09-27 DIAGNOSIS — I2510 Atherosclerotic heart disease of native coronary artery without angina pectoris: Secondary | ICD-10-CM

## 2012-09-27 DIAGNOSIS — Z7902 Long term (current) use of antithrombotics/antiplatelets: Secondary | ICD-10-CM

## 2012-09-27 DIAGNOSIS — I4891 Unspecified atrial fibrillation: Secondary | ICD-10-CM | POA: Diagnosis present

## 2012-09-27 DIAGNOSIS — I5022 Chronic systolic (congestive) heart failure: Secondary | ICD-10-CM | POA: Diagnosis present

## 2012-09-27 DIAGNOSIS — I639 Cerebral infarction, unspecified: Secondary | ICD-10-CM | POA: Diagnosis present

## 2012-09-27 DIAGNOSIS — I5021 Acute systolic (congestive) heart failure: Secondary | ICD-10-CM

## 2012-09-27 DIAGNOSIS — Z833 Family history of diabetes mellitus: Secondary | ICD-10-CM

## 2012-09-27 DIAGNOSIS — R29898 Other symptoms and signs involving the musculoskeletal system: Secondary | ICD-10-CM | POA: Diagnosis present

## 2012-09-27 DIAGNOSIS — N289 Disorder of kidney and ureter, unspecified: Secondary | ICD-10-CM

## 2012-09-27 DIAGNOSIS — F411 Generalized anxiety disorder: Secondary | ICD-10-CM

## 2012-09-27 DIAGNOSIS — Z87891 Personal history of nicotine dependence: Secondary | ICD-10-CM

## 2012-09-27 DIAGNOSIS — Z79899 Other long term (current) drug therapy: Secondary | ICD-10-CM

## 2012-09-27 DIAGNOSIS — I10 Essential (primary) hypertension: Secondary | ICD-10-CM | POA: Diagnosis present

## 2012-09-27 DIAGNOSIS — I6529 Occlusion and stenosis of unspecified carotid artery: Secondary | ICD-10-CM

## 2012-09-27 DIAGNOSIS — Z7901 Long term (current) use of anticoagulants: Secondary | ICD-10-CM

## 2012-09-27 DIAGNOSIS — Z950 Presence of cardiac pacemaker: Secondary | ICD-10-CM

## 2012-09-27 DIAGNOSIS — R4701 Aphasia: Secondary | ICD-10-CM | POA: Diagnosis present

## 2012-09-27 DIAGNOSIS — I509 Heart failure, unspecified: Secondary | ICD-10-CM | POA: Diagnosis present

## 2012-09-27 DIAGNOSIS — I428 Other cardiomyopathies: Secondary | ICD-10-CM | POA: Diagnosis present

## 2012-09-27 DIAGNOSIS — I255 Ischemic cardiomyopathy: Secondary | ICD-10-CM

## 2012-09-27 DIAGNOSIS — F419 Anxiety disorder, unspecified: Secondary | ICD-10-CM

## 2012-09-27 DIAGNOSIS — Z801 Family history of malignant neoplasm of trachea, bronchus and lung: Secondary | ICD-10-CM

## 2012-09-27 DIAGNOSIS — I6350 Cerebral infarction due to unspecified occlusion or stenosis of unspecified cerebral artery: Secondary | ICD-10-CM

## 2012-09-27 DIAGNOSIS — I6340 Cerebral infarction due to embolism of unspecified cerebral artery: Principal | ICD-10-CM | POA: Diagnosis present

## 2012-09-27 DIAGNOSIS — Z8249 Family history of ischemic heart disease and other diseases of the circulatory system: Secondary | ICD-10-CM

## 2012-09-27 DIAGNOSIS — Z823 Family history of stroke: Secondary | ICD-10-CM

## 2012-09-27 DIAGNOSIS — I447 Left bundle-branch block, unspecified: Secondary | ICD-10-CM | POA: Diagnosis present

## 2012-09-27 DIAGNOSIS — D689 Coagulation defect, unspecified: Secondary | ICD-10-CM

## 2012-09-27 DIAGNOSIS — I739 Peripheral vascular disease, unspecified: Secondary | ICD-10-CM | POA: Diagnosis present

## 2012-09-27 DIAGNOSIS — E785 Hyperlipidemia, unspecified: Secondary | ICD-10-CM | POA: Diagnosis present

## 2012-09-27 DIAGNOSIS — E876 Hypokalemia: Secondary | ICD-10-CM | POA: Diagnosis present

## 2012-09-27 LAB — CBC
Hemoglobin: 15 g/dL (ref 13.0–17.0)
MCH: 31.8 pg (ref 26.0–34.0)
MCHC: 34.1 g/dL (ref 30.0–36.0)
Platelets: 152 10*3/uL (ref 150–400)
RDW: 13.9 % (ref 11.5–15.5)

## 2012-09-27 LAB — PROTIME-INR
INR: 2.67 — ABNORMAL HIGH (ref 0.00–1.49)
Prothrombin Time: 27.5 seconds — ABNORMAL HIGH (ref 11.6–15.2)

## 2012-09-27 LAB — COMPREHENSIVE METABOLIC PANEL
ALT: 23 U/L (ref 0–53)
Alkaline Phosphatase: 66 U/L (ref 39–117)
CO2: 33 mEq/L — ABNORMAL HIGH (ref 19–32)
GFR calc Af Amer: 58 mL/min — ABNORMAL LOW (ref >90)
GFR calc non Af Amer: 50 mL/min — ABNORMAL LOW (ref >90)
Glucose, Bld: 144 mg/dL — ABNORMAL HIGH (ref 70–99)
Potassium: 2.7 mEq/L — CL (ref 3.5–5.1)
Sodium: 128 mEq/L — ABNORMAL LOW (ref 135–145)
Total Protein: 7.4 g/dL (ref 6.0–8.3)

## 2012-09-27 LAB — RAPID URINE DRUG SCREEN, HOSP PERFORMED
Benzodiazepines: NOT DETECTED
Cocaine: NOT DETECTED
Opiates: NOT DETECTED

## 2012-09-27 LAB — GLUCOSE, CAPILLARY: Glucose-Capillary: 162 mg/dL — ABNORMAL HIGH (ref 70–99)

## 2012-09-27 LAB — URINALYSIS, ROUTINE W REFLEX MICROSCOPIC
Bilirubin Urine: NEGATIVE
Leukocytes, UA: NEGATIVE
Nitrite: NEGATIVE
Specific Gravity, Urine: 1.013 (ref 1.005–1.030)
pH: 7.5 (ref 5.0–8.0)

## 2012-09-27 LAB — DIFFERENTIAL
Lymphocytes Relative: 13 % (ref 12–46)
Lymphs Abs: 1.1 10*3/uL (ref 0.7–4.0)
Neutrophils Relative %: 78 % — ABNORMAL HIGH (ref 43–77)

## 2012-09-27 LAB — POCT I-STAT, CHEM 8
BUN: 31 mg/dL — ABNORMAL HIGH (ref 6–23)
Creatinine, Ser: 1.6 mg/dL — ABNORMAL HIGH (ref 0.50–1.35)
Glucose, Bld: 145 mg/dL — ABNORMAL HIGH (ref 70–99)
Hemoglobin: 16.7 g/dL (ref 13.0–17.0)
Potassium: 2.9 mEq/L — ABNORMAL LOW (ref 3.5–5.1)

## 2012-09-27 LAB — ETHANOL: Alcohol, Ethyl (B): <11 mg/dL (ref 0–11)

## 2012-09-27 MED ORDER — POLYETHYLENE GLYCOL 3350 17 G PO PACK
17.0000 g | PACK | Freq: Every day | ORAL | Status: DC | PRN
  Filled 2012-09-27: qty 1

## 2012-09-27 MED ORDER — RIVAROXABAN 15 MG PO TABS
15.0000 mg | ORAL_TABLET | Freq: Every evening | ORAL | Status: DC
  Filled 2012-09-27: qty 1

## 2012-09-27 MED ORDER — CLOPIDOGREL BISULFATE 75 MG PO TABS
75.0000 mg | ORAL_TABLET | Freq: Every day | ORAL | Status: DC
  Administered 2012-09-28 – 2012-09-30 (×3): 75 mg via ORAL
  Filled 2012-09-27 (×4): qty 1

## 2012-09-27 MED ORDER — ACETAMINOPHEN 650 MG RE SUPP: 650.0000 mg | Freq: Four times a day (QID) | RECTAL | Status: DC | PRN

## 2012-09-27 MED ORDER — ADULT MULTIVITAMIN W/MINERALS CH
1.0000 | ORAL_TABLET | Freq: Every day | ORAL | Status: DC
  Administered 2012-09-27 – 2012-09-30 (×4): 1 via ORAL
  Filled 2012-09-27 (×4): qty 1

## 2012-09-27 MED ORDER — RIVAROXABAN 20 MG PO TABS
20.0000 mg | ORAL_TABLET | Freq: Every evening | ORAL | Status: DC
  Filled 2012-09-27: qty 1

## 2012-09-27 MED ORDER — POTASSIUM CHLORIDE CRYS ER 20 MEQ PO TBCR
40.0000 meq | EXTENDED_RELEASE_TABLET | Freq: Once | ORAL | Status: AC
  Administered 2012-09-27: 40 meq via ORAL
  Filled 2012-09-27: qty 2

## 2012-09-27 MED ORDER — METOPROLOL TARTRATE 50 MG PO TABS
50.0000 mg | ORAL_TABLET | Freq: Two times a day (BID) | ORAL | Status: DC
  Administered 2012-09-27 – 2012-09-30 (×6): 50 mg via ORAL
  Filled 2012-09-27 (×8): qty 1

## 2012-09-27 MED ORDER — FUROSEMIDE 80 MG PO TABS
80.0000 mg | ORAL_TABLET | Freq: Every day | ORAL | Status: DC
  Administered 2012-09-27 – 2012-09-30 (×4): 80 mg via ORAL
  Filled 2012-09-27 (×4): qty 1

## 2012-09-27 MED ORDER — SENNA 8.6 MG PO TABS
1.0000 | ORAL_TABLET | Freq: Two times a day (BID) | ORAL | Status: DC
  Administered 2012-09-27 – 2012-09-30 (×6): 8.6 mg via ORAL
  Filled 2012-09-27 (×9): qty 1

## 2012-09-27 MED ORDER — AMIODARONE HCL 200 MG PO TABS
200.0000 mg | ORAL_TABLET | Freq: Every day | ORAL | Status: DC
  Administered 2012-09-27 – 2012-09-30 (×4): 200 mg via ORAL
  Filled 2012-09-27 (×4): qty 1

## 2012-09-27 MED ORDER — CALCIUM CARBONATE 1250 (500 CA) MG PO TABS
1.0000 | ORAL_TABLET | Freq: Two times a day (BID) | ORAL | Status: DC
  Administered 2012-09-27 – 2012-09-30 (×6): 500 mg via ORAL
  Filled 2012-09-27 (×8): qty 1

## 2012-09-27 MED ORDER — ACETAMINOPHEN 325 MG PO TABS
650.0000 mg | ORAL_TABLET | Freq: Four times a day (QID) | ORAL | Status: DC | PRN
  Administered 2012-09-29: 650 mg via ORAL
  Filled 2012-09-27: qty 2

## 2012-09-27 MED ORDER — SODIUM CHLORIDE 0.9 % IJ SOLN
3.0000 mL | Freq: Two times a day (BID) | INTRAMUSCULAR | Status: DC
  Administered 2012-09-27 – 2012-09-29 (×5): 3 mL via INTRAVENOUS

## 2012-09-27 MED ORDER — ONDANSETRON HCL 4 MG/2ML IJ SOLN: 4.0000 mg | Freq: Four times a day (QID) | INTRAMUSCULAR | Status: DC | PRN

## 2012-09-27 MED ORDER — ONDANSETRON HCL 4 MG PO TABS
4.0000 mg | ORAL_TABLET | Freq: Four times a day (QID) | ORAL | Status: DC | PRN
  Administered 2012-09-29: 4 mg via ORAL
  Filled 2012-09-27: qty 1

## 2012-09-27 MED ORDER — CALCIUM CARBONATE 600 MG PO TABS
600.0000 mg | ORAL_TABLET | Freq: Two times a day (BID) | ORAL | Status: DC
  Filled 2012-09-27 (×2): qty 1

## 2012-09-27 MED ORDER — DOCUSATE SODIUM 100 MG PO CAPS
100.0000 mg | ORAL_CAPSULE | Freq: Two times a day (BID) | ORAL | Status: DC
  Administered 2012-09-27 – 2012-09-30 (×7): 100 mg via ORAL
  Filled 2012-09-27 (×7): qty 1

## 2012-09-27 MED ORDER — PANTOPRAZOLE SODIUM 40 MG PO TBEC
40.0000 mg | DELAYED_RELEASE_TABLET | Freq: Every day | ORAL | Status: DC
  Administered 2012-09-27 – 2012-09-30 (×4): 40 mg via ORAL
  Filled 2012-09-27 (×4): qty 1

## 2012-09-27 MED ORDER — IOHEXOL 350 MG/ML SOLN
50.0000 mL | Freq: Once | INTRAVENOUS | Status: AC | PRN
  Administered 2012-09-27: 50 mL via INTRAVENOUS

## 2012-09-27 MED ORDER — MAGNESIUM GLUCONATE 500 MG PO TABS
500.0000 mg | ORAL_TABLET | Freq: Every day | ORAL | Status: DC
  Administered 2012-09-27 – 2012-09-30 (×4): 500 mg via ORAL
  Filled 2012-09-27 (×4): qty 1

## 2012-09-27 MED ORDER — LORATADINE 10 MG PO TABS
10.0000 mg | ORAL_TABLET | Freq: Every day | ORAL | Status: DC
  Administered 2012-09-27 – 2012-09-30 (×4): 10 mg via ORAL
  Filled 2012-09-27 (×4): qty 1

## 2012-09-27 MED ORDER — NITROGLYCERIN 0.4 MG SL SUBL: 0.4000 mg | SUBLINGUAL_TABLET | SUBLINGUAL | Status: DC | PRN

## 2012-09-27 MED ORDER — ATORVASTATIN CALCIUM 20 MG PO TABS
20.0000 mg | ORAL_TABLET | Freq: Every day | ORAL | Status: DC
  Administered 2012-09-27 – 2012-09-30 (×4): 20 mg via ORAL
  Filled 2012-09-27 (×4): qty 1

## 2012-09-27 MED ORDER — LEVOTHYROXINE SODIUM 112 MCG PO TABS
112.0000 ug | ORAL_TABLET | Freq: Every day | ORAL | Status: DC
  Administered 2012-09-28 – 2012-09-30 (×3): 112 ug via ORAL
  Filled 2012-09-27 (×4): qty 1

## 2012-09-27 NOTE — Progress Notes (Signed)
Patient  Going between A fib and V tach asymptomatic call to  Md . Lynch returned call and advised to continue monitoring  Patient.

## 2012-09-27 NOTE — ED Notes (Signed)
Pt has CHF, recent stent placement, permanent pacemaker

## 2012-09-27 NOTE — H&P (Signed)
[facility] History and Physical  Trevor Simpson OZH:[id number] DOB: [birth year] DOA: 09/27/2012  Referring physician: Emergency Department PCP: Pearson Grippe, MD  Specialists: [facility] - Dr. Rosette Reveal  Chief Complaint: L sided weakness  HPI: Trevor Simpson is a 79 y.o. male with a pmh significant for cad, chronic systolic chf, PAD, htn who presents to the ED with complaints of acute onset L facial numbness that started around 6am, which later progressed to L arm and leg numbness as well. The patient alerted his family, who proceeded to bring the pt to the ED. In the ED, the patient was found to have an unremarkable non-contrast head CT. Symptoms persisted, but are slowly improving. Of note, the patient is s/p TEE on 09/23/12 that demonstrated no evidence of thrombus. The hospitalist service was consulted for admission for CVA workup.  Review of Systems: L face, arm and leg numbness, the remainder of the 10 point review of systems reviewed and are negative  Past Medical History  Diagnosis Date  . Coronary artery disease   . Other and unspecified hyperlipidemia   . Hypertension   . Atrial fibrillation   . Sinoatrial node dysfunction   . PVD (peripheral vascular disease)     s/p renal artery stent 20-04  . Pacemaker st Jude   . AAA (abdominal aortic aneurysm)     repaired  . Systolic congestive heart failure   . Cardiomyopathy    Past Surgical History  Procedure Laterality Date  . Inguinal hernia repair      left  . Cardiac pacemaker placement  08/01/2009    st jude dual chamber  . Colonoscopy    . Esophagogastroduodenoscopy    . Translumminal angioplasty    . Tee without cardioversion N/A 09/23/2012    Procedure: TRANSESOPHAGEAL ECHOCARDIOGRAM (TEE);  Surgeon: Lewayne Bunting, MD;  Location: [facility] ENDOSCOPY;  Service: Cardiovascular;  Laterality: N/A;  . Cardioversion N/A 09/23/2012    Procedure: CARDIOVERSION;  Surgeon: Lewayne Bunting, MD;  Location: [facility] ENDOSCOPY;   Service: Cardiovascular;  Laterality: N/A;   Social History:  reports that he quit smoking about 19 years ago. He has quit using smokeless tobacco. His smokeless tobacco use included Chew. He reports that he does not drink alcohol or use illicit drugs.  Allergies  Allergen Reactions  . Codeine     "tripped out on it"  . Morphine     "tripped out on it"    Family History  Problem Relation Age of Onset  . CAD Father   . CAD Brother   . Hypertension Father   . Hypertension Brother   . Diabetes Brother   . Lung cancer Sister     (be sure to complete)  Prior to Admission medications   Medication Sig Start Date End Date Taking? Authorizing Provider  amiodarone (PACERONE) 200 MG tablet Take 1 tablet (200 mg total) by mouth daily. 09/18/12  Yes Rhonda G Barrett, PA-C  atorvastatin (LIPITOR) 20 MG tablet Take 20 mg by mouth daily.     Yes Historical Provider, MD  B Complex-C (SUPER B COMPLEX) TABS Take 1 tablet by mouth daily.    Yes Historical Provider, MD  calcium carbonate (OS-CAL) 600 MG TABS Take 600 mg by mouth 2 (two) times daily with a meal.     Yes Historical Provider, MD  cetirizine (ZYRTEC) 10 MG tablet Take 10 mg by mouth daily.  08/24/10  Yes Historical Provider, MD  Cholecalciferol 1000 UNITS capsule Take 1,000 Units by  mouth daily.     Yes Historical Provider, MD  clopidogrel (PLAVIX) 75 MG tablet Take 1 tablet (75 mg total) by mouth daily with breakfast. 09/18/12  Yes Rhonda G Barrett, PA-C  fish oil-omega-3 fatty acids 1000 MG capsule Take 1 g by mouth 2 (two) times daily.    Yes Historical Provider, MD  furosemide (LASIX) 80 MG tablet Take 1 tablet (80 mg total) by mouth daily. 09/20/12  Yes Gilda Crease, MD  levothyroxine (SYNTHROID, LEVOTHROID) 112 MCG tablet Take 112 mcg by mouth daily.   Yes Historical Provider, MD  magnesium gluconate (MAGONATE) 500 MG tablet Take 500 mg by mouth daily.    Yes Historical Provider, MD  metoprolol (LOPRESSOR) 50 MG tablet Take 1  tablet (50 mg total) by mouth 2 (two) times daily. 09/18/12  Yes Rhonda G Barrett, PA-C  Multiple Vitamin (MULTIVITAMIN WITH MINERALS) TABS Take 1 tablet by mouth daily.   Yes Historical Provider, MD  nitroGLYCERIN (NITROSTAT) 0.4 MG SL tablet Place 1 tablet (0.4 mg total) under the tongue every 5 (five) minutes as needed for chest pain. 01/15/12  Yes Marinus Maw, MD  omeprazole (PRILOSEC) 20 MG capsule Take 20 mg by mouth daily as needed (indigestion).    Yes Historical Provider, MD  Rivaroxaban (XARELTO) 20 MG TABS Take 20 mg by mouth every evening.   Yes Historical Provider, MD  Saw Palmetto, Serenoa repens, (SAW PALMETTO PO) Take 1 capsule by mouth 2 (two) times daily.   Yes Historical Provider, MD  torsemide (DEMADEX) 20 MG tablet Take 2 tablets (40 mg total) by mouth daily. 09/25/12  Yes Sena Hitch, MD  vitamin C (ASCORBIC ACID) 500 MG tablet Take 500 mg by mouth daily.     Yes Historical Provider, MD  diphenhydramine-acetaminophen (TYLENOL PM) 25-500 MG TABS Take 1-1.5 tablets by mouth at bedtime as needed (sleep).    Historical Provider, MD   Physical Exam: Filed Vitals:   09/27/12 1206 09/27/12 1247 09/27/12 1325  BP: 123/77 123/74   Pulse: 77 85   Temp: 97.9 F (36.6 C) 98 F (36.7 C) 97.7 F (36.5 C)  TempSrc: Oral Oral   Resp: 18 20   SpO2: 97% 96%      General:  Awake, in nad  Eyes: regular, s1, s2  ENT: membranes moist, dentition fair  Neck: trachea midline, neck supple  Cardiovascular: regular, s1, s2  Respiratory: normal resp effort, no wheezing  Abdomen: soft, nondistended  Skin: no abnormal skin lesions seen, normal skin turgor  Musculoskeletal: perfused, no clubbing or cyanosis  Psychiatric: normal  Neurologic: decreased sensation over L arm, leg, and face, strength intact elsewhere  Labs on Admission:  Basic Metabolic Panel:  Recent Labs Lab 09/25/12 1720 09/27/12 1223 09/27/12 1235  NA 132* 128* 134*  K 3.5 2.7* 2.9*  CL 90* 83* 90*   CO2 27 33*  --   GLUCOSE 167* 144* 145*  BUN 38* 34* 31*  CREATININE 1.47* 1.32 1.60*  CALCIUM 9.8 9.9  --    Liver Function Tests:  Recent Labs Lab 09/27/12 1223  AST 28  ALT 23  ALKPHOS 66  BILITOT 2.8*  PROT 7.4  ALBUMIN 3.9   No results found for this basename: LIPASE, AMYLASE,  in the last 168 hours No results found for this basename: AMMONIA,  in the last 168 hours CBC:  Recent Labs Lab 09/25/12 1720 09/27/12 1223 09/27/12 1235  WBC 8.0 8.2  --   NEUTROABS  --  6.4  --  HGB 14.8 15.0 16.7  HCT 44.4 44.0 49.0  MCV 94.3 93.2  --   PLT 149* 152  --    Cardiac Enzymes:  Recent Labs Lab 09/27/12 1224  TROPONINI <0.30    BNP (last 3 results)  Recent Labs  09/12/12 1834 09/19/12 2159 09/25/12 1720  PROBNP 4730.0* 9116.0* 7086.0*   CBG:  Recent Labs Lab 09/27/12 1207 09/27/12 1236  GLUCAP 133* 162*    Radiological Exams on Admission: Dg Chest 2 View  09/25/2012   *RADIOLOGY REPORT*  Clinical Data: Cough.  Recent stent and thoracentesis.  CHEST - 2 VIEW  Comparison: Radiographs 09/19/2012.  CT 09/12/2010.  Findings: Left subclavian pacemaker leads are unchanged within the right atrium and right ventricle.  Heart size and mediastinal contours are stable.  The pleural effusions have decreased in volume.  There is mild edema and bibasilar atelectasis.  No pneumothorax is evident.  IMPRESSION: Improved pleural effusions with residual mild edema and bibasilar atelectasis.  No new findings demonstrated.   Original Report Authenticated By: Carey Bullocks, M.D.   Ct Head Wo Contrast  09/27/2012   *RADIOLOGY REPORT*  Clinical Data: Code stroke.  Left sided numbness  CT HEAD WITHOUT CONTRAST  Technique:  Contiguous axial images were obtained from the base of the skull through the vertex without contrast.  Comparison: None.  Findings: Mild atrophy.  Negative for hydrocephalus.  No acute infarct.  Negative for hemorrhage or mass lesion.  No acute bony abnormality.   Mild mucosal edema in the maxillary sinuses.  IMPRESSION: No acute abnormality.   Original Report Authenticated By: Janeece Riggers, M.D.    Assessment/Plan Principal Problem:   CVA (cerebral infarction) Active Problems:   Other and unspecified hyperlipidemia   HYPERTENSION   CAD   ATRIAL FIBRILLATION, PAROXYSMAL   CAROTID ARTERY DISEASE   CVA: - Likely acute CVA - for now, continue plavix and xarelto - Neuro consulted - Given hx of pacemaker, cannot order MRI - Consider CTA head and neck - Will not order 2d echo given TEE done recently - Admit to tele - Serial neuro checks - Will consult PT/OT/SLP  HTN: - Currently stable and controlled - given hx of CAD and chf, consider resuming meds for now  Chronic systolic CHF - Followed by [facility] - Currently compensated - Monitor I/O's - Cont diuretics for now. - Recent cardiology note reviewed. Plans were for close follow up at CHF clinic next week. Should chf become decompensated, consider Cardiology consult  A.fib: - Rate controlled - Paced  CAD: - Asymptomatic - Cont to monitor  DVT prophylaxis: - On therapeutic anticoagulation  Code Status: Full - confirmed with family (must indicate code status--if unknown or must be presumed, indicate so) Family Communication: Pt and family in room (indicate person spoken with, if applicable, with phone number if by telephone) Disposition Plan: Pending (indicate anticipated LOS)  Time spent: 30  CHIU, STEPHEN K [facility] Pager [phone]  If 7PM-7AM, please contact night-coverage [email or web address] Password [facility] 09/27/2012, 1:46 PM

## 2012-09-27 NOTE — ED Provider Notes (Signed)
History    CSN: [id number] Arrival date & time 09/27/12  1150  First MD Initiated Contact with Patient 09/27/12 1229     Chief Complaint  Patient presents with  . Code Stroke   (Consider location/radiation/quality/duration/timing/severity/associated sxs/prior Treatment) HPI History obtained from patient and family. Patient states he got up about 6 AM and laid back down. However about 8 AM he tried to get back up and he was unable to walk. He states he was staggering but did not fall. He complains of numbness of his left thigh. He denies any headache. He states he's never had this before. He was recently seen in the ED 2 days ago for congestive heart or. Patient has a history of chronic atrial for relation and has a pacemaker followed by [facility] cardiology. Family reports he did have some slurred speech earlier this morning. Patient reports he was on xarelto and recently was started on Plavix.  Patient reports his father had a history of frequent strokes before he died in his sleep.   PCP Dr Selena Batten Cardiology Dr Ladona Ridgel  Past Medical History  Diagnosis Date  . Coronary artery disease   . Other and unspecified hyperlipidemia   . Hypertension   . Atrial fibrillation   . Sinoatrial node dysfunction   . PVD (peripheral vascular disease)     s/p renal artery stent 20-04  . Pacemaker st Jude   . AAA (abdominal aortic aneurysm)     repaired  . Systolic congestive heart failure   . Cardiomyopathy    Past Surgical History  Procedure Laterality Date  . Inguinal hernia repair      left  . Cardiac pacemaker placement  08/01/2009    st jude dual chamber  . Colonoscopy    . Esophagogastroduodenoscopy    . Translumminal angioplasty    . Tee without cardioversion N/A 09/23/2012    Procedure: TRANSESOPHAGEAL ECHOCARDIOGRAM (TEE);  Surgeon: Lewayne Bunting, MD;  Location: [facility] ENDOSCOPY;  Service: Cardiovascular;  Laterality: N/A;  . Cardioversion N/A 09/23/2012    Procedure: CARDIOVERSION;   Surgeon: Lewayne Bunting, MD;  Location: [facility] ENDOSCOPY;  Service: Cardiovascular;  Laterality: N/A;   Family History  Problem Relation Age of Onset  . CAD Father   . CAD Brother   . Hypertension Father   . Hypertension Brother   . Diabetes Brother   . Lung cancer Sister    History  Substance Use Topics  . Smoking status: Former Smoker    Quit date: 03/19/1993  . Smokeless tobacco: Former Neurosurgeon    Types: Chew  . Alcohol Use: No    Review of Systems  All other systems reviewed and are negative.    Allergies  Codeine and Morphine  Home Medications   Current Outpatient Rx  Name  Route  Sig  Dispense  Refill  . amiodarone (PACERONE) 200 MG tablet   Oral   Take 1 tablet (200 mg total) by mouth daily.   30 tablet   11   . atorvastatin (LIPITOR) 20 MG tablet   Oral   Take 20 mg by mouth daily.           . B Complex-C (SUPER B COMPLEX) TABS   Oral   Take 1 tablet by mouth daily.          . calcium carbonate (OS-CAL) 600 MG TABS   Oral   Take 600 mg by mouth 2 (two) times daily with a meal.           .  cetirizine (ZYRTEC) 10 MG tablet   Oral   Take 10 mg by mouth daily.          . Cholecalciferol 1000 UNITS capsule   Oral   Take 1,000 Units by mouth daily.           . clopidogrel (PLAVIX) 75 MG tablet   Oral   Take 1 tablet (75 mg total) by mouth daily with breakfast.   30 tablet   0   . diphenhydramine-acetaminophen (TYLENOL PM) 25-500 MG TABS   Oral   Take 1-1.5 tablets by mouth at bedtime as needed (sleep).         . fish oil-omega-3 fatty acids 1000 MG capsule   Oral   Take 1 g by mouth 2 (two) times daily.          . furosemide (LASIX) 80 MG tablet   Oral   Take 1 tablet (80 mg total) by mouth daily.   90 tablet   0   . levothyroxine (SYNTHROID, LEVOTHROID) 112 MCG tablet   Oral   Take 112 mcg by mouth daily.         . magnesium gluconate (MAGONATE) 500 MG tablet   Oral   Take 500 mg by mouth daily.          .  metoprolol (LOPRESSOR) 50 MG tablet   Oral   Take 1 tablet (50 mg total) by mouth 2 (two) times daily.   60 tablet   11   . Multiple Vitamin (MULTIVITAMIN WITH MINERALS) TABS   Oral   Take 1 tablet by mouth daily.         . nitroGLYCERIN (NITROSTAT) 0.4 MG SL tablet   Sublingual   Place 1 tablet (0.4 mg total) under the tongue every 5 (five) minutes as needed for chest pain.   25 tablet   3   . omeprazole (PRILOSEC) 20 MG capsule   Oral   Take 20 mg by mouth daily as needed (indigestion).          . Rivaroxaban (XARELTO) 20 MG TABS   Oral   Take 20 mg by mouth every evening.         . Saw Palmetto, Serenoa repens, (SAW PALMETTO PO)   Oral   Take 1 capsule by mouth 2 (two) times daily.         Marland Kitchen torsemide (DEMADEX) 20 MG tablet   Oral   Take 2 tablets (40 mg total) by mouth daily.   60 tablet   0   . vitamin C (ASCORBIC ACID) 500 MG tablet   Oral   Take 500 mg by mouth daily.            BP 123/77  Pulse 77  Temp(Src) 97.9 F (36.6 C) (Oral)  Resp 18  SpO2 97%  Vital signs normal    Physical Exam  Nursing note and vitals reviewed. Constitutional: He is oriented to person, place, and time. He appears well-developed and well-nourished.  Non-toxic appearance. He does not appear ill. No distress.  HENT:  Head: Normocephalic and atraumatic.  Right Ear: External ear normal.  Left Ear: External ear normal.  Nose: Nose normal. No mucosal edema or rhinorrhea.  Mouth/Throat: Oropharynx is clear and moist and mucous membranes are normal. No dental abscesses or edematous.  Eyes: Conjunctivae and EOM are normal. Pupils are equal, round, and reactive to light.  Neck: Normal range of motion and full passive range of motion without pain. Neck supple.  Cardiovascular: Normal rate, regular rhythm and normal heart sounds.  Exam reveals no gallop and no friction rub.   No murmur heard. Pulmonary/Chest: Effort normal and breath sounds normal. No respiratory distress.  He has no wheezes. He has no rhonchi. He has no rales. He exhibits no tenderness and no crepitus.  Abdominal: Soft. Normal appearance and bowel sounds are normal. He exhibits no distension. There is no tenderness. There is no rebound and no guarding.  Musculoskeletal: Normal range of motion. He exhibits no edema and no tenderness.  Moves all extremities well.   Neurological: He is alert and oriented to person, place, and time. He has normal strength. No cranial nerve deficit.  Neuro exam observed while being performed by Dr. Amada Jupiter, neurologist There is no pronator drift. Patient does have decreased sensation in his left upper arm and left lower leg. He has some mild uncoordination on finger to nose on the right and with heel to shin on the right.  Skin: Skin is warm, dry and intact. No rash noted. No erythema. No pallor.  Psychiatric: He has a normal mood and affect. His speech is normal and behavior is normal. His mood appears not anxious.    ED Course  Procedures (including critical care time)  12:45 Dr Amada Jupiter, Neurology has finished his evaluation, wants medicine to admit. NIH score is 4  PT not able to have MR b/o pacemaker  Pt started on oral potassium for his hypokalemia  13:19 Dr Rhona Leavens, admit to tele, 4N, team 10  Results for orders placed during the hospital encounter of 09/27/12  ETHANOL      Result Value Range   Alcohol, Ethyl (B) <11  0 - 11 mg/dL  PROTIME-INR      Result Value Range   Prothrombin Time 27.5 (*) 11.6 - 15.2 seconds   INR 2.67 (*) 0.00 - 1.49  APTT      Result Value Range   aPTT 42 (*) 24 - 37 seconds  CBC      Result Value Range   WBC 8.2  4.0 - 10.5 K/uL   RBC 4.72  4.22 - 5.81 MIL/uL   Hemoglobin 15.0  13.0 - 17.0 g/dL   HCT 16.1  09.6 - 04.5 %   MCV 93.2  78.0 - 100.0 fL   MCH 31.8  26.0 - 34.0 pg   MCHC 34.1  30.0 - 36.0 g/dL   RDW 40.9  81.1 - 91.4 %   Platelets 152  150 - 400 K/uL  DIFFERENTIAL      Result Value Range    Neutrophils Relative % 78 (*) 43 - 77 %   Neutro Abs 6.4  1.7 - 7.7 K/uL   Lymphocytes Relative 13  12 - 46 %   Lymphs Abs 1.1  0.7 - 4.0 K/uL   Monocytes Relative 8  3 - 12 %   Monocytes Absolute 0.7  0.1 - 1.0 K/uL   Eosinophils Relative 1  0 - 5 %   Eosinophils Absolute 0.1  0.0 - 0.7 K/uL   Basophils Relative 0  0 - 1 %   Basophils Absolute 0.0  0.0 - 0.1 K/uL  COMPREHENSIVE METABOLIC PANEL      Result Value Range   Sodium 128 (*) 135 - 145 mEq/L   Potassium 2.7 (*) 3.5 - 5.1 mEq/L   Chloride 83 (*) 96 - 112 mEq/L   CO2 33 (*) 19 - 32 mEq/L   Glucose, Bld 144 (*) 70 - 99 mg/dL  BUN 34 (*) 6 - 23 mg/dL   Creatinine, Ser 8.11  0.50 - 1.35 mg/dL   Calcium 9.9  8.4 - 91.4 mg/dL   Total Protein 7.4  6.0 - 8.3 g/dL   Albumin 3.9  3.5 - 5.2 g/dL   AST 28  0 - 37 U/L   ALT 23  0 - 53 U/L   Alkaline Phosphatase 66  39 - 117 U/L   Total Bilirubin 2.8 (*) 0.3 - 1.2 mg/dL   GFR calc non Af Amer 50 (*) >90 mL/min   GFR calc Af Amer 58 (*) >90 mL/min  TROPONIN I      Result Value Range   Troponin I <0.30  <0.30 ng/mL  GLUCOSE, CAPILLARY      Result Value Range   Glucose-Capillary 133 (*) 70 - 99 mg/dL  GLUCOSE, CAPILLARY      Result Value Range   Glucose-Capillary 162 (*) 70 - 99 mg/dL  POCT I-STAT, CHEM 8      Result Value Range   Sodium 134 (*) 135 - 145 mEq/L   Potassium 2.9 (*) 3.5 - 5.1 mEq/L   Chloride 90 (*) 96 - 112 mEq/L   BUN 31 (*) 6 - 23 mg/dL   Creatinine, Ser 7.82 (*) 0.50 - 1.35 mg/dL   Glucose, Bld 956 (*) 70 - 99 mg/dL   Calcium, Ion 2.13 (*) 1.13 - 1.30 mmol/L   TCO2 32  0 - 100 mmol/L   Hemoglobin 16.7  13.0 - 17.0 g/dL   HCT 08.6  57.8 - 46.9 %  POCT I-STAT TROPONIN I      Result Value Range   Troponin i, poc 0.02  0.00 - 0.08 ng/mL   Comment 3            Laboratory interpretation all normal except hypokalemia, low chloride, metabolic alkalosis, renal insuffic, therapeutic INR   Ct Head Wo Contrast  09/27/2012   *RADIOLOGY REPORT*  Clinical Data:  Code stroke.  Left sided numbness  CT HEAD WITHOUT CONTRAST  Technique:  Contiguous axial images were obtained from the base of the skull through the vertex without contrast.  Comparison: None.  Findings: Mild atrophy.  Negative for hydrocephalus.  No acute infarct.  Negative for hemorrhage or mass lesion.  No acute bony abnormality.  Mild mucosal edema in the maxillary sinuses.  IMPRESSION: No acute abnormality.   Original Report Authenticated By: Janeece Riggers, M.D.     Date: 09/27/2012  Rate: 82  Rhythm: atrial fibrillation and some paced beats  QRS Axis: right  Intervals: normal  ST/T Wave abnormalities: nonspecific ST/T changes  Conduction Disutrbances:nonspecific intraventricular conduction delay  Narrative Interpretation:   Old EKG Reviewed: changes noted from 09/25/2012 was totally in paced rhythm      1. Acute ischemic stroke   2. Hypokalemia   3. Renal insufficiency   4. Atrial fibrillation   5. Coagulopathy    Plan admission   Devoria Albe, MD, FACEP  CRITICAL CARE Performed by: Devoria Albe L Total critical care time: 31 min Critical care time was exclusive of separately billable procedures and treating other patients. Critical care was necessary to treat or prevent imminent or life-threatening deterioration. Critical care was time spent personally by me on the following activities: development of treatment plan with patient and/or surrogate as well as nursing, discussions with consultants, evaluation of patient's response to treatment, examination of patient, obtaining history from patient or surrogate, ordering and performing treatments and interventions, ordering and review of laboratory  studies, ordering and review of radiographic studies, pulse oximetry and re-evaluation of patient's condition.   MDM     Ward Givens, MD 09/27/12 1325

## 2012-09-27 NOTE — Progress Notes (Signed)
Attempted to get report from [place], Charity fundraiser. She will return call.

## 2012-09-27 NOTE — ED Provider Notes (Signed)
I saw and evaluated the patient, reviewed the resident's note and I agree with the findings and plan. Pt with improved symptoms after IV lasix. F/u with CHF clinic.   Loren Racer, MD 09/27/12 [phone]

## 2012-09-27 NOTE — Consult Note (Addendum)
Neurology Consultation Reason for Consult: Concern for stroke Referring Physician: Devoria Albe  CC: Left sided numbness  History is obtained from: Patient, family  HPI: Trevor Simpson is a 79 y.o. male with a history of CHF, A. fib who takes as xarelto as well as Plavix. Today he woke up around 6 AM and felt okay. Then went back to sleep around 7 AM got up and found that he was unable to walk. He also noticed that he was numb on the left side.   LKW: 6 AM tpa given: No, anticoagulated NIH : 3    ROS: A 14 point ROS was performed and is negative except as noted in the HPI.  Past Medical History  Diagnosis Date  . Coronary artery disease   . Other and unspecified hyperlipidemia   . Hypertension   . Atrial fibrillation   . Sinoatrial node dysfunction   . PVD (peripheral vascular disease)     s/p renal artery stent 20-04  . Pacemaker st Jude   . AAA (abdominal aortic aneurysm)     repaired  . Systolic congestive heart failure   . Cardiomyopathy     Family History: No history of stroke  Social History: Tob: Denies  Exam: Current vital signs: BP 135/73  Pulse 81  Temp(Src) 97.3 F (36.3 C) (Oral)  Resp 18  Ht 6' (1.829 m)  Wt 89.8 kg (197 lb 15.6 oz)  BMI 26.84 kg/m2  SpO2 100% Vital signs in last 24 hours: Temp:  [97.3 F (36.3 C)-98 F (36.7 C)] 97.3 F (36.3 C) (07/12 1436) Pulse Rate:  [77-85] 81 (07/12 1436) Resp:  [18-20] 18 (07/12 1436) BP: (123-135)/(73-77) 135/73 mmHg (07/12 1436) SpO2:  [96 %-100 %] 100 % (07/12 1436) Weight:  [89.8 kg (197 lb 15.6 oz)] 89.8 kg (197 lb 15.6 oz) (07/12 1436)  General: In bed, NAD CV: Irregular Mental Status: Patient is awake, alert, oriented to person, place, month, year, and situation. Immediate and remote memory are intact. Patient is able to give a clear and coherent history. No signs of aphasia or neglect Cranial Nerves: II: Visual Fields are full. Pupils are equal, round, and reactive to light.  Discs are  difficult to visualize. III,IV, VI: EOMI without ptosis or diploplia.  V: Facial sensation is diminished on left VII: Facial movement is symmetric.  VIII: hearing is intact to voice X: Uvula elevates symmetrically XI: Shoulder shrug is symmetric. XII: tongue is midline without atrophy or fasciculations.  Motor: Tone is normal. Bulk is normal. 5/5 strength was present in bilateral arms and left leg, right leg drifts with possible 4+/5 weakness. Sensory: Sensation is diminished on the left to pin Deep Tendon Reflexes: 2+ and symmetric in the biceps and patellae.  Cerebellar: FNF and HKS are ataxic on the right Gait: Not assessed due to patient safety concerns  I have reviewed labs in epic and the results pertinent to this consultation are: Hypokalemia, elevated creatinine  I have reviewed the images obtained: CT head-negative acute  Impression: 79 year old male with a history of atrial fibrillation who presents with sudden onset left-sided numbness and right-sided ataxia.  Recommendations: 1. HgbA1c, fasting lipid panel 2. MRI, MRA  of the brain without contrast 3. Frequent neuro checks 4. Echocardiogram not needed due to recent TEE 5. Carotid dopplers 6. Prophylactic therapy-continue Plavix, I would favor holding Xeralto for a few days in the setting of a stroke.  7. Risk factor modification 8. Telemetry monitoring 9. PT consult, OT consult, Speech  consult    Ritta Slot, MD [facility] [phone]  If 7pm- 7am, please page neurology on call at [phone].

## 2012-09-27 NOTE — ED Notes (Signed)
PT states that he is numbness on left side and cannot walk and symptoms started between.  Pt states that between 6-7a woke up and went to the bathroom and states he was normal and then laid back down and then started having.  PT has no drifts, no facial droop, states numbness to left fingers, numbness to left face and was in throat and teeth.  Pt feels off balance

## 2012-09-27 NOTE — Progress Notes (Signed)
Arrived 4N at 13

## 2012-09-28 ENCOUNTER — Inpatient Hospital Stay (HOSPITAL_COMMUNITY): Payer: Medicare Other

## 2012-09-28 DIAGNOSIS — I63239 Cerebral infarction due to unspecified occlusion or stenosis of unspecified carotid arteries: Secondary | ICD-10-CM

## 2012-09-28 DIAGNOSIS — I2589 Other forms of chronic ischemic heart disease: Secondary | ICD-10-CM

## 2012-09-28 LAB — CBC
HCT: 45.3 % (ref 39.0–52.0)
MCH: 32.2 pg (ref 26.0–34.0)
MCV: 93.6 fL (ref 78.0–100.0)
Platelets: 158 10*3/uL (ref 150–400)
RBC: 4.84 MIL/uL (ref 4.22–5.81)

## 2012-09-28 LAB — COMPREHENSIVE METABOLIC PANEL
AST: 27 U/L (ref 0–37)
Albumin: 3.7 g/dL (ref 3.5–5.2)
Chloride: 92 mEq/L — ABNORMAL LOW (ref 96–112)
Creatinine, Ser: 1.41 mg/dL — ABNORMAL HIGH (ref 0.50–1.35)
Potassium: 2.9 mEq/L — ABNORMAL LOW (ref 3.5–5.1)
Total Bilirubin: 2.4 mg/dL — ABNORMAL HIGH (ref 0.3–1.2)

## 2012-09-28 MED ORDER — ENOXAPARIN SODIUM 40 MG/0.4ML ~~LOC~~ SOLN
40.0000 mg | SUBCUTANEOUS | Status: DC
  Administered 2012-09-28 – 2012-09-30 (×3): 40 mg via SUBCUTANEOUS
  Filled 2012-09-28 (×3): qty 0.4

## 2012-09-28 MED ORDER — POTASSIUM CHLORIDE CRYS ER 20 MEQ PO TBCR
40.0000 meq | EXTENDED_RELEASE_TABLET | Freq: Two times a day (BID) | ORAL | Status: DC
  Administered 2012-09-28 – 2012-09-30 (×5): 40 meq via ORAL
  Filled 2012-09-28 (×6): qty 2

## 2012-09-28 MED ORDER — POTASSIUM CHLORIDE 10 MEQ/100ML IV SOLN
10.0000 meq | INTRAVENOUS | Status: AC
  Administered 2012-09-28: 10 meq via INTRAVENOUS
  Filled 2012-09-28 (×5): qty 100

## 2012-09-28 MED ORDER — POTASSIUM CHLORIDE CRYS ER 20 MEQ PO TBCR
40.0000 meq | EXTENDED_RELEASE_TABLET | ORAL | Status: AC
  Administered 2012-09-28 – 2012-09-29 (×2): 40 meq via ORAL
  Filled 2012-09-28 (×2): qty 2

## 2012-09-28 MED ORDER — POTASSIUM CHLORIDE 20 MEQ PO PACK: 40.0000 meq | PACK | Freq: Two times a day (BID) | ORAL | Status: DC

## 2012-09-28 NOTE — Consult Note (Signed)
Asked by Dr Anne Hahn to see patient for symptomatic carotid stenosis.  Pt currently in CT scan. Will return tomorrow to evaluate.  Fabienne Bruns, MD [facility] Office: [phone] Pager: [phone]

## 2012-09-28 NOTE — Progress Notes (Signed)
Order for p.o potassium obtained.

## 2012-09-28 NOTE — Evaluation (Signed)
Occupational Therapy Evaluation Patient Details Name: Trevor Simpson MRN: [id number] DOB: 1934/02/01 Today's Date: 09/28/2012 Time: 7829-5621 OT Time Calculation (min): 15 min  OT Assessment / Plan / Recommendation History of present illness  Pt with PMH significant for cad, chronic systolic chf, PAD, htn who presented to the ED with complaints of acute onset L numbness. In the ED, the patient was found to have an unremarkable non-contrast head CT. MRI cannot be done due to pacemaker. Symptoms persisted, but are slowly improving.     Clinical Impression   Pt presenting at overall supervision level with ADLs and functional mobility. Pt's family reports pt will have 24/7 supervision/assist at home.  No further acute OT needs at this time.    OT Assessment  Patient does not need any further OT services    Follow Up Recommendations  No OT follow up;Supervision/Assistance - 24 hour    Barriers to Discharge      Equipment Recommendations  None recommended by OT    Recommendations for Other Services    Frequency       Precautions / Restrictions Precautions Precautions: Fall   Pertinent Vitals/Pain See vitals    ADL  Upper Body Bathing: Simulated;Supervision/safety Where Assessed - Upper Body Bathing: Unsupported sitting Lower Body Bathing: Simulated;Supervision/safety Where Assessed - Lower Body Bathing: Unsupported sit to stand Upper Body Dressing: Simulated;Supervision/safety Where Assessed - Upper Body Dressing: Unsupported sitting Lower Body Dressing: Performed;Supervision/safety Where Assessed - Lower Body Dressing: Unsupported sit to stand Toilet Transfer: Simulated;Supervision/safety Toilet Transfer Method: Sit to Barista:  (chair) Equipment Used: Gait belt;Rolling walker Transfers/Ambulation Related to ADLs: supervision with RW. No LOB. ADL Comments: Pt able to don/doff socks without any difficulty. Overall supervision for ADLs for safety.  Family reports that pt will have 24/7 supervision/assist. Discussed possibility of getting a shower chair with family, and daughter agreed that this would be useful/safe resource and she plans to shop around for one. Grandson also reports he can install a grab bar in shower for pt.    OT Diagnosis:    OT Problem List:   OT Treatment Interventions:     OT Goals(Current goals can be found in the care plan section)    Visit Information  Last OT Received On: 09/28/12 Assistance Needed: +1       Prior Functioning     Home Living Family/patient expects to be discharged to:: Private residence Living Arrangements: Spouse/significant other Available Help at Discharge: Available 24 hours/day Type of Home: House Home Access: Stairs to enter [employer] of Steps: 1 Entrance Stairs-Rails: None Home Layout: Multi-level;Laundry or work area in basement;Able to live on main level with bedroom/bathroom Alternate Teacher, music of Steps: flight Home Equipment: Gilmer Mor - single point Additional Comments: reports using cane for outdoor ambulation Prior Function Level of Independence: Independent;Independent with assistive device(s) Communication Communication: HOH         Vision/Perception     Cognition  Cognition Arousal/Alertness: Awake/alert Behavior During Therapy: WFL for tasks assessed/performed Overall Cognitive Status: Within Functional Limits for tasks assessed    Extremity/Trunk Assessment Upper Extremity Assessment Upper Extremity Assessment: Overall WFL for tasks assessed L UE returned to baseline    Mobility Bed Mobility Bed Mobility: Not assessed Transfers Transfers: Sit to Stand;Stand to Sit Sit to Stand: 5: Supervision;From chair/3-in-1;With upper extremity assist Stand to Sit: 5: Supervision;To chair/3-in-1;With upper extremity assist Details for Transfer Assistance: Supervision for safety.       Exercise     Balance  End of Session OT -  End of Session Equipment Utilized During Treatment: Gait belt;Rolling walker Activity Tolerance: Patient tolerated treatment well Patient left: in chair;with call bell/phone within reach;with family/visitor present Nurse Communication: Mobility status  GO    09/28/2012 Cipriano Mile OTR/L Pager [phone] Office [phone]  Cipriano Mile 09/28/2012, 3:03 PM

## 2012-09-28 NOTE — Progress Notes (Signed)
Stroke Team Progress Note  HISTORY Trevor Simpson is a 79 y.o. male with a history of CHF, A. fib who takes xarelto as well as Plavix. On 09/27/12 he woke up around 6 AM and felt okay. Then went back to sleep around 7 AM got up and found that he was unable to walk. He also noticed that he was numb on the left side.   LKW: 6 AM 09/27/12 tpa given: No, anticoagulated  NIH : 3   SUBJECTIVE The patient's wife and daughter present this morning. They all agree that the patient is much better today. The patient states that his numbness is almost completely resolved.  OBJECTIVE Most recent Vital Signs: Filed Vitals:   09/27/12 2215 09/28/12 0130 09/28/12 0500 09/28/12 0520  BP: 126/61 116/75  119/76  Pulse: 75 70  78  Temp: 97.7 F (36.5 C) 98.1 F (36.7 C)  98.3 F (36.8 C)  TempSrc: Oral Oral  Oral  Resp: 16 16  15   Height:      Weight:   88.6 kg (195 lb 5.2 oz)   SpO2: 98% 98%  99%   CBG (last 3)   Recent Labs  09/27/12 1207 09/27/12 1236  GLUCAP 133* 162*    IV Fluid Intake:     MEDICATIONS  . amiodarone  200 mg Oral Daily  . atorvastatin  20 mg Oral Daily  . calcium carbonate  1 tablet Oral BID WC  . clopidogrel  75 mg Oral Q breakfast  . docusate sodium  100 mg Oral BID  . furosemide  80 mg Oral Daily  . levothyroxine  112 mcg Oral QAC breakfast  . loratadine  10 mg Oral Daily  . magnesium gluconate  500 mg Oral Daily  . metoprolol  50 mg Oral BID  . multivitamin with minerals  1 tablet Oral Daily  . pantoprazole  40 mg Oral Daily  . senna  1 tablet Oral BID  . sodium chloride  3 mL Intravenous Q12H   PRN:  acetaminophen, acetaminophen, nitroGLYCERIN, ondansetron (ZOFRAN) IV, ondansetron, polyethylene glycol  Diet:  Cardiac thin liquids Activity: Up with assistance DVT Prophylaxis:  Lovenox  CLINICALLY SIGNIFICANT STUDIES Basic Metabolic Panel:  Recent Labs Lab 09/27/12 1223 09/27/12 1235 09/28/12 0650  NA 128* 134* 137  K 2.7* 2.9* 2.9*  CL 83* 90*  92*  CO2 33*  --  33*  GLUCOSE 144* 145* 127*  BUN 34* 31* 25*  CREATININE 1.32 1.60* 1.41*  CALCIUM 9.9  --  10.0   Liver Function Tests:  Recent Labs Lab 09/27/12 1223 09/28/12 0650  AST 28 27  ALT 23 21  ALKPHOS 66 67  BILITOT 2.8* 2.4*  PROT 7.4 7.3  ALBUMIN 3.9 3.7   CBC:  Recent Labs Lab 09/27/12 1223 09/27/12 1235 09/28/12 0650  WBC 8.2  --  9.8  NEUTROABS 6.4  --   --   HGB 15.0 16.7 15.6  HCT 44.0 49.0 45.3  MCV 93.2  --  93.6  PLT 152  --  158   Coagulation:  Recent Labs Lab 09/27/12 1223  LABPROT 27.5*  INR 2.67*   Cardiac Enzymes:  Recent Labs Lab 09/27/12 1224  TROPONINI <0.30   Urinalysis:  Recent Labs Lab 09/27/12 1701  COLORURINE YELLOW  LABSPEC 1.013  PHURINE 7.5  GLUCOSEU NEGATIVE  HGBUR NEGATIVE  BILIRUBINUR NEGATIVE  KETONESUR NEGATIVE  PROTEINUR NEGATIVE  UROBILINOGEN 0.2  NITRITE NEGATIVE  LEUKOCYTESUR NEGATIVE   Lipid Panel No results found for  this basename: chol, trig, hdl, cholhdl, vldl, ldlcalc   HgbA1C  No results found for this basename: HGBA1C    Urine Drug Screen:     Component Value Date/Time   LABOPIA NONE DETECTED 09/27/2012 1701   COCAINSCRNUR NONE DETECTED 09/27/2012 1701   LABBENZ NONE DETECTED 09/27/2012 1701   AMPHETMU NONE DETECTED 09/27/2012 1701   THCU NONE DETECTED 09/27/2012 1701   LABBARB NONE DETECTED 09/27/2012 1701    Alcohol Level:  Recent Labs Lab 09/27/12 1223  ETH <11    Ct Angio Head W/cm &/or Wo Cm 09/27/2012   CTA NECK   Bilateral pleural effusions.  Irregular shaggy plaque of the visualized aspect of the thoracic aorta (most notable distal arch and descending thoracic aorta). Ectatic ascending thoracic aorta measures up to 3.7 cm.  Calcified plaque origin of the great vessels with mild narrowing innominate artery, mild to moderate narrowing proximal left common carotid artery and moderate narrowing origin of the left subclavian artery.  Right carotid bifurcation calcified  irregular plaque with 82% diameter stenosis of the proximal right internal carotid artery.  Left carotid bifurcation plaque with 54% diameter stenosis proximal left internal carotid artery.  Plaque with mild narrowing and irregularity right subclavian artery.  Mild to slightly moderate narrowing proximal right vertebral artery.  Proximal aspect of the left vertebral artery is not visualized. Minimal flow is seen within the vertical segment possibly from collateral flow.  Fullness of the left posterior-superior nasopharynx may represent coaptation membranes although mucosa abnormality cannot be excluded.    CTA HEAD    No intracranial hemorrhage.  No intracranial enhancing lesion.  Small vessel disease type changes most notable anterior right external capsule region.  No CT evidence of large acute infarct.  Right vertebral artery is dominant.  Moderate tandem stenosis of the vertebral arteries.  Mild narrowing of portions of the basilar artery.  Irregularity with narrowing of portions of the PICAs, AICAs, superior cerebral arteries and posterior cerebral arteries.  Petrous and cavernous segment internal carotid artery moderate narrowing and irregularity with calcified plaque.  Mild narrowing M1 segment of the middle cerebral artery more notable on the right.  Aplastic A1 segment right anterior cerebral artery.  Middle cerebral artery and anterior cerebral artery branch vessel irregularity bilaterally.    Ct Head Wo Contrast 09/27/2012 No acute abnormality.      proximal left common carotid artery and moderate narrowing origin of the left subclavian artery.  Right carotid   MRI of the brain  the patient has a pacemaker  MRA of the brain  the patient has a pacemaker  2D Echocardiogram    TEE 09/23/12 - ejection fraction 20-25%. No cardiac source of emboli was identified.  Carotid Doppler   see CTA  of the neck as noted above  CXR  Improved pleural effusions with residual mild edema and bibasilar  atelectasis. No new findings demonstrated.    EKG  - atrial fibrillation ventricular response 82 beats per minute.  Therapy Recommendations pending  Physical Exam    General - the pleasant 79 year old male in no acute distress Heart - irregularly irregular - no murmer noted Lungs - Clear to auscultation but decreased Skin - Warm and dry  NEUROLOGIC:   MENTAL STATUS: awake, alert, oriented, language fluent,  follows simple commands.  CRANIAL NERVES: pupils equal and reactive to light,extraocular muscles intact, facial sensation and strength symmetric, uvula midlinec, tongue midline MOTOR: normal bulk and tone, Strength - 5 over 5 throughout SENSORY: normal and symmetric to  light touch  COORDINATION: finger-nose-finger normal     ASSESSMENT [name] is a 79 y.o. male presenting with left hemisensory deficits. TPA was not given secondary to the fact that the patient was already on Xarelto.  Imaging confirms no acute infarct. The patient is unable to have an MRI secondary to a permanent pacemaker.  Infarct felt to be embolic secondary to atrial fibrillation.  On clopidogrel 75 mg orally every day and Xarelto prior to admission. Now on clopidogrel 75 mg orally every day for secondary stroke prevention. Patient with resolution of deficits. Work up underway.   82% right internal carotid artery stenosis / plaque.  Left ventricular dysfunction with ejection fraction 20-25%  Atrial fibrillation with permanent pacemaker  Chronic congestive heart failure and coronary artery disease.  Hypertension  Hyperlipidemia  Renal insufficiency with hypokalemia  Hospital day # 1  TREATMENT/PLAN  Continue clopidogrel 75 mg orally every day for secondary stroke prevention. Xarelto discontinued.  Consider vascular surgery consult for right internal carotid artery stenosis Will call for eval today  Consider repeat head CT  Will re-check Patient has had good resolution of the  clinical deficits.  Await therapists evaluations  Hassel Neth [facility] Pager [phone] 09/28/2012, 9:27 AM  I have personally obtained a history, examined the patient, evaluated imaging results, and formulated the assessment and plan of care. I agree with the above. Lesly Dukes

## 2012-09-28 NOTE — Progress Notes (Signed)
Patient OOB to chair for dinner.

## 2012-09-28 NOTE — Progress Notes (Signed)
ANTICOAGULATION CONSULT NOTE - Initial Consult  Pharmacy Consult for lovenox Indication: VTE prophylaxis  Allergies  Allergen Reactions  . Codeine     "tripped out on it"  . Morphine     "tripped out on it"    Patient Measurements: Height: 6' (182.9 cm) Weight: 195 lb 5.2 oz (88.6 kg) IBW/kg (Calculated) : 77.6   Vital Signs: Temp: 97.5 F (36.4 C) (07/13 0929) Temp src: Oral (07/13 0929) BP: 126/59 mmHg (07/13 0929) Pulse Rate: 78 (07/13 0929)  Labs:  Recent Labs  09/25/12 1720 09/27/12 1223 09/27/12 1224 09/27/12 1235 09/28/12 0650  HGB 14.8 15.0  --  16.7 15.6  HCT 44.4 44.0  --  49.0 45.3  PLT 149* 152  --   --  158  APTT  --  42*  --   --   --   LABPROT  --  27.5*  --   --   --   INR  --  2.67*  --   --   --   CREATININE 1.47* 1.32  --  1.60* 1.41*  TROPONINI  --   --  <0.30  --   --     Estimated Creatinine Clearance: 48.2 ml/min (by C-G formula based on Cr of 1.41).   Medical History: Past Medical History  Diagnosis Date  . Coronary artery disease   . Other and unspecified hyperlipidemia   . Hypertension   . Atrial fibrillation   . Sinoatrial node dysfunction   . PVD (peripheral vascular disease)     s/p renal artery stent 20-04  . Pacemaker st Jude   . AAA (abdominal aortic aneurysm)     repaired  . Systolic congestive heart failure   . Cardiomyopathy     Medications:  Prescriptions prior to admission  Medication Sig Dispense Refill  . amiodarone (PACERONE) 200 MG tablet Take 1 tablet (200 mg total) by mouth daily.  30 tablet  11  . atorvastatin (LIPITOR) 20 MG tablet Take 20 mg by mouth daily.        . B Complex-C (SUPER B COMPLEX) TABS Take 1 tablet by mouth daily.       . calcium carbonate (OS-CAL) 600 MG TABS Take 600 mg by mouth 2 (two) times daily with a meal.        . cetirizine (ZYRTEC) 10 MG tablet Take 10 mg by mouth daily.       . Cholecalciferol 1000 UNITS capsule Take 1,000 Units by mouth daily.        . clopidogrel  (PLAVIX) 75 MG tablet Take 1 tablet (75 mg total) by mouth daily with breakfast.  30 tablet  0  . fish oil-omega-3 fatty acids 1000 MG capsule Take 1 g by mouth 2 (two) times daily.       . furosemide (LASIX) 80 MG tablet Take 1 tablet (80 mg total) by mouth daily.  90 tablet  0  . levothyroxine (SYNTHROID, LEVOTHROID) 112 MCG tablet Take 112 mcg by mouth daily.      . magnesium gluconate (MAGONATE) 500 MG tablet Take 500 mg by mouth daily.       . metoprolol (LOPRESSOR) 50 MG tablet Take 1 tablet (50 mg total) by mouth 2 (two) times daily.  60 tablet  11  . Multiple Vitamin (MULTIVITAMIN WITH MINERALS) TABS Take 1 tablet by mouth daily.      . nitroGLYCERIN (NITROSTAT) 0.4 MG SL tablet Place 1 tablet (0.4 mg total) under the tongue every 5 (five)  minutes as needed for chest pain.  25 tablet  3  . omeprazole (PRILOSEC) 20 MG capsule Take 20 mg by mouth daily as needed (indigestion).       . Rivaroxaban (XARELTO) 20 MG TABS Take 20 mg by mouth every evening.      . Saw Palmetto, Serenoa repens, (SAW PALMETTO PO) Take 1 capsule by mouth 2 (two) times daily.      Marland Kitchen torsemide (DEMADEX) 20 MG tablet Take 2 tablets (40 mg total) by mouth daily.  60 tablet  0  . vitamin C (ASCORBIC ACID) 500 MG tablet Take 500 mg by mouth daily.        . diphenhydramine-acetaminophen (TYLENOL PM) 25-500 MG TABS Take 1-1.5 tablets by mouth at bedtime as needed (sleep).        Assessment: 79 yo man who was taking xarelto PTA for afib who presents with left sided numbness.  His last dose of xarelto was 7/11.  He is starting lovenox for VTE px. Goal of Therapy:  Anti Xa level 0.3-0.6 Monitor platelets by anticoagulation protocol: Yes   Plan:  Lovenox 40 mg sq q24 hours. CBC q 3 days while on lovenox.  Talbert Cage Poteet 09/28/2012,9:30 AM

## 2012-09-28 NOTE — Progress Notes (Signed)
[facility] PROGRESS NOTE  Trevor Simpson:[id number] DOB: September 07, 1933 DOA: 09/27/2012 PCP: Pearson Grippe, MD  Assessment/Plan: CVA:  - Suspected CVA  - for now, continue plavix and hold xarelto per Neuro recs - Neuro following  - Given hx of pacemaker, cannot order MRI  - CTA neck with 82% stenosis on R - May consider vascular consult? - Will not order 2d echo given TEE done recently  - Admit to tele  - Serial neuro checks  - Will consult PT/OT/SLP  HTN:  - Currently stable and controlled  - given hx of CAD and chf, consider resuming meds for now  Chronic systolic CHF  - Followed by [facility]  - Currently compensated  - Monitor I/O's  - Cont diuretics for now.  - Recent cardiology note reviewed. Plans were for close follow up at CHF clinic next week. Should chf become decompensated, consider Cardiology consult  A.fib:  - Rate controlled  - Paced  CAD:  - Asymptomatic  - Cont to monitor  DVT prophylaxis:  - On therapeutic anticoagulation  Code Status: Full Family Communication: Pt in room (indicate person spoken with, relationship, and if by phone, the number) Disposition Plan: Pending   Consultants:  Neurology  HPI/Subjective: No complaints. Reports markedly improved symptoms.  Objective: Filed Vitals:   09/27/12 2215 09/28/12 0130 09/28/12 0500 09/28/12 0520  BP: 126/61 116/75  119/76  Pulse: 75 70  78  Temp: 97.7 F (36.5 C) 98.1 F (36.7 C)  98.3 F (36.8 C)  TempSrc: Oral Oral  Oral  Resp: 16 16  15   Height:      Weight:   88.6 kg (195 lb 5.2 oz)   SpO2: 98% 98%  99%    Intake/Output Summary (Last 24 hours) at 09/28/12 0810 Last data filed at 09/28/12 0159  Gross per 24 hour  Intake      0 ml  Output   1150 ml  Net  -1150 ml   Filed Weights   09/27/12 1436 09/28/12 0500  Weight: 89.8 kg (197 lb 15.6 oz) 88.6 kg (195 lb 5.2 oz)    Exam:   General:  Awake, in nad  Cardiovascular: regular, s1, s2  Respiratory: normal  resp effort, no wheezing  Abdomen: soft, nondistended  Musculoskeletal: perfused, no clubbing, no LE edema   Data Reviewed: Basic Metabolic Panel:  Recent Labs Lab 09/25/12 1720 09/27/12 1223 09/27/12 1235 09/28/12 0650  NA 132* 128* 134* 137  K 3.5 2.7* 2.9* 2.9*  CL 90* 83* 90* 92*  CO2 27 33*  --  33*  GLUCOSE 167* 144* 145* 127*  BUN 38* 34* 31* 25*  CREATININE 1.47* 1.32 1.60* 1.41*  CALCIUM 9.8 9.9  --  10.0   Liver Function Tests:  Recent Labs Lab 09/27/12 1223 09/28/12 0650  AST 28 27  ALT 23 21  ALKPHOS 66 67  BILITOT 2.8* 2.4*  PROT 7.4 7.3  ALBUMIN 3.9 3.7   No results found for this basename: LIPASE, AMYLASE,  in the last 168 hours No results found for this basename: AMMONIA,  in the last 168 hours CBC:  Recent Labs Lab 09/25/12 1720 09/27/12 1223 09/27/12 1235 09/28/12 0650  WBC 8.0 8.2  --  9.8  NEUTROABS  --  6.4  --   --   HGB 14.8 15.0 16.7 15.6  HCT 44.4 44.0 49.0 45.3  MCV 94.3 93.2  --  93.6  PLT 149* 152  --  158   Cardiac Enzymes:  Recent Labs Lab 09/27/12 1224  TROPONINI <0.30   BNP (last 3 results)  Recent Labs  09/12/12 1834 09/19/12 2159 09/25/12 1720  PROBNP 4730.0* 9116.0* 7086.0*   CBG:  Recent Labs Lab 09/27/12 1207 09/27/12 1236  GLUCAP 133* 162*    No results found for this or any previous visit (from the past 240 hour(s)).   Studies: Ct Angio Head W/cm &/or Wo Cm  09/27/2012   *RADIOLOGY REPORT*  Clinical Data:  DIZZINESS.  ATRIAL FIBRILLATION.  HIGH BLOOD PRESSURE.  PACEMAKER IN PLACE.  CARDIOMYOPATHY.  NUMBNESS LEFT SIDE.  CT ANGIOGRAPHY HEAD AND NECK  Technique:  Multidetector CT imaging of the head and neck was performed using the standard protocol during bolus administration of intravenous contrast.  Multiplanar CT image reconstructions including MIPs were obtained to evaluate the vascular anatomy. Carotid stenosis measurements (when applicable) are obtained utilizing NASCET criteria, using the  distal internal carotid diameter as the denominator.  Contrast: 50mL OMNIPAQUE IOHEXOL 350 MG/ML SOLN  Comparison:  09/27/2012 head CT.  No comparison angiogram.  CTA NECK  Findings:  Bilateral pleural effusions.  Irregular shaggy plaque of the visualized aspect of the thoracic aorta (most notable distal arch and descending thoracic aorta). Ectatic ascending thoracic aorta measures up to 3.7 cm.  Calcified plaque origin of the great vessels with mild narrowing innominate artery, mild to moderate narrowing proximal left common carotid artery and moderate narrowing origin of the left subclavian artery.  Plaque with mild narrowing right common carotid artery.  Right carotid bifurcation calcified irregular plaque with 82% diameter stenosis of the proximal right internal carotid artery.  Mild to slightly moderate narrowing right internal carotid artery just proximal to the skull base.  Plaque of the left common carotid artery.  Left carotid bifurcation plaque with 54% diameter stenosis proximal left internal carotid artery.  Plaque with mild narrowing distal left internal carotid artery vertical cervical segment.  Plaque with mild narrowing and irregularity right subclavian artery.  Mild to slightly moderate narrowing proximal right vertebral artery.  Proximal aspect of the left vertebral artery is not visualized. Minimal flow is seen within the vertical segment possibly from collateral flow.  Fullness of the left posterior-superior nasopharynx may represent coaptation membranes although mucosa abnormality cannot be excluded.  Cervical spondylotic changes with various degrees of spinal stenosis and foraminal narrowing and   Review of the MIP images confirms the above findings.  IMPRESSION: Bilateral pleural effusions.  Irregular shaggy plaque of the visualized aspect of the thoracic aorta (most notable distal arch and descending thoracic aorta). Ectatic ascending thoracic aorta measures up to 3.7 cm.  Calcified plaque  origin of the great vessels with mild narrowing innominate artery, mild to moderate narrowing proximal left common carotid artery and moderate narrowing origin of the left subclavian artery.  Right carotid bifurcation calcified irregular plaque with 82% diameter stenosis of the proximal right internal carotid artery.  Left carotid bifurcation plaque with 54% diameter stenosis proximal left internal carotid artery.  Plaque with mild narrowing and irregularity right subclavian artery.  Mild to slightly moderate narrowing proximal right vertebral artery.  Proximal aspect of the left vertebral artery is not visualized. Minimal flow is seen within the vertical segment possibly from collateral flow.  Fullness of the left posterior-superior nasopharynx may represent coaptation membranes although mucosa abnormality cannot be excluded.  CTA HEAD  Findings:  No intracranial hemorrhage.  No intracranial enhancing lesion.  Small vessel disease type changes most notable anterior right external capsule region.  No CT  evidence of large acute infarct. No hydrocephalus.  Right vertebral artery is dominant.  Moderate tandem stenosis of the vertebral arteries.  Mild narrowing of portions of the basilar artery.  Irregularity with narrowing of portions of the PICAs, AICAs, superior cerebral arteries and posterior cerebral arteries.  Petrous and cavernous segment internal carotid artery moderate narrowing and irregularity with calcified plaque.  Mild narrowing M1 segment of the middle cerebral artery more notable on the right.  Aplastic A1 segment right anterior cerebral artery.  Middle cerebral artery and anterior cerebral artery branch vessel irregularity bilaterally.  No aneurysm noted.   Review of the MIP images confirms the above findings.  IMPRESSION: No intracranial hemorrhage.  No intracranial enhancing lesion.  Small vessel disease type changes most notable anterior right external capsule region.  No CT evidence of large acute  infarct.  Right vertebral artery is dominant.  Moderate tandem stenosis of the vertebral arteries.  Mild narrowing of portions of the basilar artery.  Irregularity with narrowing of portions of the PICAs, AICAs, superior cerebral arteries and posterior cerebral arteries.  Petrous and cavernous segment internal carotid artery moderate narrowing and irregularity with calcified plaque.  Mild narrowing M1 segment of the middle cerebral artery more notable on the right.  Aplastic A1 segment right anterior cerebral artery.  Middle cerebral artery and anterior cerebral artery branch vessel irregularity bilaterally.   Original Report Authenticated By: Lacy Duverney, M.D.   Ct Head Wo Contrast  09/27/2012   *RADIOLOGY REPORT*  Clinical Data: Code stroke.  Left sided numbness  CT HEAD WITHOUT CONTRAST  Technique:  Contiguous axial images were obtained from the base of the skull through the vertex without contrast.  Comparison: None.  Findings: Mild atrophy.  Negative for hydrocephalus.  No acute infarct.  Negative for hemorrhage or mass lesion.  No acute bony abnormality.  Mild mucosal edema in the maxillary sinuses.  IMPRESSION: No acute abnormality.   Original Report Authenticated By: Janeece Riggers, M.D.   Ct Angio Neck W/cm &/or Wo/cm  09/27/2012   *RADIOLOGY REPORT*  Clinical Data:  DIZZINESS.  ATRIAL FIBRILLATION.  HIGH BLOOD PRESSURE.  PACEMAKER IN PLACE.  CARDIOMYOPATHY.  NUMBNESS LEFT SIDE.  CT ANGIOGRAPHY HEAD AND NECK  Technique:  Multidetector CT imaging of the head and neck was performed using the standard protocol during bolus administration of intravenous contrast.  Multiplanar CT image reconstructions including MIPs were obtained to evaluate the vascular anatomy. Carotid stenosis measurements (when applicable) are obtained utilizing NASCET criteria, using the distal internal carotid diameter as the denominator.  Contrast: 50mL OMNIPAQUE IOHEXOL 350 MG/ML SOLN  Comparison:  09/27/2012 head CT.  No comparison  angiogram.  CTA NECK  Findings:  Bilateral pleural effusions.  Irregular shaggy plaque of the visualized aspect of the thoracic aorta (most notable distal arch and descending thoracic aorta). Ectatic ascending thoracic aorta measures up to 3.7 cm.  Calcified plaque origin of the great vessels with mild narrowing innominate artery, mild to moderate narrowing proximal left common carotid artery and moderate narrowing origin of the left subclavian artery.  Plaque with mild narrowing right common carotid artery.  Right carotid bifurcation calcified irregular plaque with 82% diameter stenosis of the proximal right internal carotid artery.  Mild to slightly moderate narrowing right internal carotid artery just proximal to the skull base.  Plaque of the left common carotid artery.  Left carotid bifurcation plaque with 54% diameter stenosis proximal left internal carotid artery.  Plaque with mild narrowing distal left internal carotid artery vertical cervical  segment.  Plaque with mild narrowing and irregularity right subclavian artery.  Mild to slightly moderate narrowing proximal right vertebral artery.  Proximal aspect of the left vertebral artery is not visualized. Minimal flow is seen within the vertical segment possibly from collateral flow.  Fullness of the left posterior-superior nasopharynx may represent coaptation membranes although mucosa abnormality cannot be excluded.  Cervical spondylotic changes with various degrees of spinal stenosis and foraminal narrowing and   Review of the MIP images confirms the above findings.  IMPRESSION: Bilateral pleural effusions.  Irregular shaggy plaque of the visualized aspect of the thoracic aorta (most notable distal arch and descending thoracic aorta). Ectatic ascending thoracic aorta measures up to 3.7 cm.  Calcified plaque origin of the great vessels with mild narrowing innominate artery, mild to moderate narrowing proximal left common carotid artery and moderate narrowing  origin of the left subclavian artery.  Right carotid bifurcation calcified irregular plaque with 82% diameter stenosis of the proximal right internal carotid artery.  Left carotid bifurcation plaque with 54% diameter stenosis proximal left internal carotid artery.  Plaque with mild narrowing and irregularity right subclavian artery.  Mild to slightly moderate narrowing proximal right vertebral artery.  Proximal aspect of the left vertebral artery is not visualized. Minimal flow is seen within the vertical segment possibly from collateral flow.  Fullness of the left posterior-superior nasopharynx may represent coaptation membranes although mucosa abnormality cannot be excluded.  CTA HEAD  Findings:  No intracranial hemorrhage.  No intracranial enhancing lesion.  Small vessel disease type changes most notable anterior right external capsule region.  No CT evidence of large acute infarct. No hydrocephalus.  Right vertebral artery is dominant.  Moderate tandem stenosis of the vertebral arteries.  Mild narrowing of portions of the basilar artery.  Irregularity with narrowing of portions of the PICAs, AICAs, superior cerebral arteries and posterior cerebral arteries.  Petrous and cavernous segment internal carotid artery moderate narrowing and irregularity with calcified plaque.  Mild narrowing M1 segment of the middle cerebral artery more notable on the right.  Aplastic A1 segment right anterior cerebral artery.  Middle cerebral artery and anterior cerebral artery branch vessel irregularity bilaterally.  No aneurysm noted.   Review of the MIP images confirms the above findings.  IMPRESSION: No intracranial hemorrhage.  No intracranial enhancing lesion.  Small vessel disease type changes most notable anterior right external capsule region.  No CT evidence of large acute infarct.  Right vertebral artery is dominant.  Moderate tandem stenosis of the vertebral arteries.  Mild narrowing of portions of the basilar artery.   Irregularity with narrowing of portions of the PICAs, AICAs, superior cerebral arteries and posterior cerebral arteries.  Petrous and cavernous segment internal carotid artery moderate narrowing and irregularity with calcified plaque.  Mild narrowing M1 segment of the middle cerebral artery more notable on the right.  Aplastic A1 segment right anterior cerebral artery.  Middle cerebral artery and anterior cerebral artery branch vessel irregularity bilaterally.   Original Report Authenticated By: Lacy Duverney, M.D.    Scheduled Meds: . amiodarone  200 mg Oral Daily  . atorvastatin  20 mg Oral Daily  . calcium carbonate  1 tablet Oral BID WC  . clopidogrel  75 mg Oral Q breakfast  . docusate sodium  100 mg Oral BID  . furosemide  80 mg Oral Daily  . levothyroxine  112 mcg Oral QAC breakfast  . loratadine  10 mg Oral Daily  . magnesium gluconate  500 mg Oral Daily  .  metoprolol  50 mg Oral BID  . multivitamin with minerals  1 tablet Oral Daily  . pantoprazole  40 mg Oral Daily  . senna  1 tablet Oral BID  . sodium chloride  3 mL Intravenous Q12H   Continuous Infusions:   Principal Problem:   CVA (cerebral infarction) Active Problems:   Other and unspecified hyperlipidemia   HYPERTENSION   CAD   ATRIAL FIBRILLATION, PAROXYSMAL   CAROTID ARTERY DISEASE    Time spent:    Danella Philson K  [facility] Pager [phone]. If 7PM-7AM, please contact night-coverage at [email or web address], password [facility] 09/28/2012, 8:10 AM  LOS: 1 day

## 2012-09-28 NOTE — Consult Note (Signed)
Consultation Requesting: Anne Hahn Reason: Symptomatic right ICA stenosis  History of Present Illness:  Patient is a 79 y.o. year old male who presents for evaluation of carotid stenosis.  Symptoms related to this stenosis include recent right brain stroke with left face arm and leg weakness.  According to pt and family he has recovered somewhat in the last 48 hours but still is having some slurred speech and difficulty walking.  The patient also has history of atrial fibrillation and was on Xarelto and plavix prior to admission. The patient is currently on antiplatelet therapy.  The carotid stenosis was found on screening duplex and has been followed by [facility] but now seems to have progressed.  Other medical problems include CHF with admission for this in the last week EF 25%.  CAD with recent cath by Dr [place] RCA occluded and 2 vessel disease otherwise.  The heart failure has not been stable with multiple recent admissions.  He also has history of hypertension and has a pacemaker for bradycardia.  His primary cardiologist is Dr Ladona Ridgel.  The patient denies prior stroke or TIA before this.  Currently on Plavix and statin.   Past Medical History  Diagnosis Date  . Coronary artery disease   . Other and unspecified hyperlipidemia   . Hypertension   . Atrial fibrillation   . Sinoatrial node dysfunction   . PVD (peripheral vascular disease)     s/p renal artery stent 20-04  . Pacemaker st Jude   . AAA (abdominal aortic aneurysm)     repaired  . Systolic congestive heart failure   . Cardiomyopathy     Past Surgical History  Procedure Laterality Date  . Inguinal hernia repair      left  . Cardiac pacemaker placement  08/01/2009    st jude dual chamber  . Colonoscopy    . Esophagogastroduodenoscopy    . Translumminal angioplasty    . Tee without cardioversion N/A 09/23/2012    Procedure: TRANSESOPHAGEAL ECHOCARDIOGRAM (TEE);  Surgeon: Lewayne Bunting, MD;  Location: [facility] ENDOSCOPY;   Service: Cardiovascular;  Laterality: N/A;  . Cardioversion N/A 09/23/2012    Procedure: CARDIOVERSION;  Surgeon: Lewayne Bunting, MD;  Location: [facility] ENDOSCOPY;  Service: Cardiovascular;  Laterality: N/A;     Social History History  Substance Use Topics  . Smoking status: Former Smoker    Quit date: 03/19/1993  . Smokeless tobacco: Former Neurosurgeon    Types: Chew  . Alcohol Use: No    Family History Family History  Problem Relation Age of Onset  . CAD Father   . CAD Brother   . Hypertension Father   . Hypertension Brother   . Diabetes Brother   . Lung cancer Sister     Allergies  Allergies  Allergen Reactions  . Codeine     "tripped out on it"  . Morphine     "tripped out on it"     Current Facility-Administered Medications  Medication Dose Route Frequency Provider Last Rate Last Dose  . acetaminophen (TYLENOL) tablet 650 mg  650 mg Oral Q6H PRN Jerald Kief, MD       Or  . acetaminophen (TYLENOL) suppository 650 mg  650 mg Rectal Q6H PRN Jerald Kief, MD      . amiodarone (PACERONE) tablet 200 mg  200 mg Oral Daily Jerald Kief, MD   200 mg at 09/28/12 1038  . atorvastatin (LIPITOR) tablet 20 mg  20 mg Oral Daily Jerald Kief, MD  20 mg at 09/28/12 1040  . calcium carbonate (OS-CAL - dosed in mg of elemental calcium) tablet 500 mg of elemental calcium  1 tablet Oral BID WC Jerald Kief, MD   500 mg of elemental calcium at 09/28/12 0733  . clopidogrel (PLAVIX) tablet 75 mg  75 mg Oral Q breakfast Jerald Kief, MD   75 mg at 09/28/12 0733  . docusate sodium (COLACE) capsule 100 mg  100 mg Oral BID Jerald Kief, MD   100 mg at 09/28/12 1037  . enoxaparin (LOVENOX) injection 40 mg  40 mg Subcutaneous Q24H Jerald Kief, MD   40 mg at 09/28/12 1044  . furosemide (LASIX) tablet 80 mg  80 mg Oral Daily Jerald Kief, MD   80 mg at 09/28/12 1038  . levothyroxine (SYNTHROID, LEVOTHROID) tablet 112 mcg  112 mcg Oral QAC breakfast Jerald Kief, MD   112 mcg at  09/28/12 [phone]  . loratadine (CLARITIN) tablet 10 mg  10 mg Oral Daily Jerald Kief, MD   10 mg at 09/28/12 1040  . magnesium gluconate (MAGONATE) tablet 500 mg  500 mg Oral Daily Jerald Kief, MD   500 mg at 09/28/12 1040  . metoprolol tartrate (LOPRESSOR) tablet 50 mg  50 mg Oral BID Jerald Kief, MD   50 mg at 09/28/12 1038  . multivitamin with minerals tablet 1 tablet  1 tablet Oral Daily Jerald Kief, MD   1 tablet at 09/28/12 1039  . nitroGLYCERIN (NITROSTAT) SL tablet 0.4 mg  0.4 mg Sublingual Q5 min PRN Jerald Kief, MD      . ondansetron [facility]) tablet 4 mg  4 mg Oral Q6H PRN Jerald Kief, MD       Or  . ondansetron [facility]) injection 4 mg  4 mg Intravenous Q6H PRN Jerald Kief, MD      . pantoprazole (PROTONIX) EC tablet 40 mg  40 mg Oral Daily Jerald Kief, MD   40 mg at 09/28/12 1042  . polyethylene glycol (MIRALAX / GLYCOLAX) packet 17 g  17 g Oral Daily PRN Jerald Kief, MD      . potassium chloride SA (K-DUR,KLOR-CON) CR tablet 40 mEq  40 mEq Oral BID York Spaniel, MD   40 mEq at 09/28/12 1109  . senna (SENOKOT) tablet 8.6 mg  1 tablet Oral BID Jerald Kief, MD   8.6 mg at 09/28/12 1037  . sodium chloride 0.9 % injection 3 mL  3 mL Intravenous Q12H Jerald Kief, MD   3 mL at 09/28/12 1042    ROS:   General:  No weight loss, Fever, chills  HEENT: No recent headaches, no nasal bleeding, no visual changes, no sore throat  Neurologic: No dizziness, blackouts, seizures. No recent symptoms of stroke or mini- stroke. No recent episodes of slurred speech, or temporary blindness.  Cardiac: No recent episodes of chest pain/pressure, + shortness of breath at rest.  + shortness of breath with exertion.  + history of atrial fibrillation or irregular heartbeat  Vascular: No history of rest pain in feet.  No history of claudication.  No history of non-healing ulcer, No history of DVT   Pulmonary: No home oxygen, no productive cough, no  hemoptysis  Musculoskeletal:  [x ] Arthritis, [ ]  Low back pain,  [ ]  Joint pain  Hematologic:No history of hypercoagulable state.  No history of easy bleeding.  No history of anemia  Gastrointestinal: No  hematochezia or melena,  No gastroesophageal reflux, no trouble swallowing  Urinary: [ ]  chronic Kidney disease, [ ]  on HD - [ ]  MWF or [ ]  TTHS, [ ]  Burning with urination, [ ]  Frequent urination, [ ]  Difficulty urinating;   Skin: No rashes  Psychological: + history of anxiety,  No history of depression   Physical Examination  Filed Vitals:   09/28/12 0500 09/28/12 0520 09/28/12 0929 09/28/12 1418  BP:  119/76 126/59 116/70  Pulse:  78 78 69  Temp:  98.3 F (36.8 C) 97.5 F (36.4 C) 97.7 F (36.5 C)  TempSrc:  Oral Oral Oral  Resp:  15 18 18   Height:      Weight: 195 lb 5.2 oz (88.6 kg)     SpO2:  99% 94% 98%    Body mass index is 26.49 kg/(m^2).  General:  Alert and oriented, no acute distress, slurred speech HEENT: Normal Neck: No JVD Pulmonary: Few crackles in bases Cardiac: Regular Rate and Rhythm Gastrointestinal: Soft, non-tender, non-distended, no mass Skin: No rash Extremity Pulses:  2+ radial, brachial, femoral pulses bilaterally Musculoskeletal: No deformity or edema  Neurologic: Upper and lower extremity motor 5/5 and symmetric, no obvious facial droop  DATA: CT head no bleed, some atrophy.  CTA neck chest shaggy arch and desc thoracic aorta, 80% right ICA but calcified and may be overestimate but probably at least 50-70%. Left IcA 50%   ASSESSMENT: Recent stroke atheroembolic most likely since he was on Xarelto for his afib.  He does not really have well compensated CHF at this point with pleural effusions and multiple recent hospital admissions.  This would make his extremely high risk for carotid endarterectomy with cardiac complications especially in light of his known coronary disease.  Carotid stenting may be an option but he has significant atheroma  burden in his arch which will certainly increase stroke risk.   PLAN:1. Would like to see if Dr Ladona Ridgel and his team have any other suggestions to optimize his cardiac status.  This would reduce risk of carotid stenting or endarterectomy.  Please have Cardiology reevaluate patient for his CHF as well as cardiac risk stratification.  I will arrange follow up with me as an outpatient August 31.  Potentially carotid stent or CEA in 6 weeks.  Risks benefits overall plan cardiac and neuro status discussed with family and patient.   Fabienne Bruns, MD [facility] Office: [phone] Pager: [phone]

## 2012-09-28 NOTE — Plan of Care (Signed)
Problem: Phase I Progression Outcomes Goal: Stroke Team notified of admission Outcome: Not Applicable Date Met:  09/28/12 Beyond tpa window; seen by Dr. Amada Jupiter.

## 2012-09-28 NOTE — Evaluation (Signed)
Physical Therapy Evaluation Patient Details Name: Trevor Simpson MRN: [id number] DOB: [birth year] Today's Date: 09/28/2012 Time: 4098-1191 PT Time Calculation (min): 43 min  PT Assessment / Plan / Recommendation History of Present Illness   79 y.o. male with a pmh significant for cad, chronic systolic chf, PAD, htn who presented to the ED with complaints of acute onset L numbness.  In the ED, the patient was found to have an unremarkable non-contrast head CT. MRI cannot be done due to pacemaker. Symptoms persisted, but are slowly improving.   Clinical Impression  Pt presents with moderate functional limitations due to impaired balance/postural control affecting safety with walking tasks.  Will benefit from ongoing physical therapy to address deficits and limitations in order to return to gardening, outdoor walking and decrease need for ambulatory aid.  Demonstrates improved safety with use of rolling walker along with supportive family therefore recommend home d/c, HHPT and rolling walker at discharge.  Plan to follow acutely, please see notes below and refer to care plan for acute PT goals.    PT Assessment  Patient needs continued PT services    Follow Up Recommendations  Home health PT;Supervision - Intermittent    Does the patient have the potential to tolerate intense rehabilitation      Barriers to Discharge        Equipment Recommendations  Rolling walker with 5" wheels    Recommendations for Other Services OT consult;Speech consult   Frequency Min 4X/week    Precautions / Restrictions Precautions Precautions: Fall Precaution Comments: history or falls; Educated on multiple systems that control balance and pt's current function; encouraged to continue therapy to address balance deficits as balance can be improved.  Pt/spouse verbalized   Pertinent Vitals/Pain Discussed with nurse the report of a-fib/vtach overnight; pt has hx afib and is paced rhythm which may explain monitor  results; nurse reports no restriction on activity due to rhythm issues.      Mobility  Bed Mobility Bed Mobility: Not assessed (pt up at EOB) Transfers Transfers: Sit to Stand;Stand to Sit Sit to Stand: 4: Min assist;With upper extremity assist;From bed Stand to Sit: 4: Min guard;With upper extremity assist;To bed Details for Transfer Assistance: effortful initial rise to standing, reaching for external support to steady self; continues to need minimal assist to prevent LOB upon initial rise over time, with controlled descent note; instructional cues for safest transition using RW Ambulation/Gait Ambulation/Gait Assistance: 3: Mod assist;5: Supervision Ambulation Distance (Feet): 250 Feet (two trials) Assistive device: 1 person hand held assist;Rolling walker (first trial no walker/hha; second with rw and supervision ) Ambulation/Gait Assistance Details: initial trial without ambulatory aid, bilateral hip guard from behind vs. side by side with hand held assist; instructional cues for head position; physical assist to prevent LOB due to gait mechanics and postural unsteadiness;  second trial with RW and no physical assistance, instructional cues for speed and trajectory Gait Pattern: Step-through pattern;Decreased stride length;Scissoring;Narrow base of support;Trunk flexed Gait velocity: unmeasured; decreased with ability to increase General Gait Details: significant thoracic kyphosis, tends to look downward with noted changes in gait mechanics with changing head positions; able to vary speed upon command; takes increase # steps to turn and stop and requires external support to prevent loss of balance; without walker steps lack consistent symmetry and right foot tends toward flat foot strike; with RW most gait mechanics deficits resolve adequately to allow greater subjective velocity and note even, symmetrical gait pattern.  Discussed with patient use of device  for confidence and independence  while receiving continued therapy and work with [facility] therapist toward goal of not needing this device longterm.   Stairs: No Modified Rankin (Stroke Patients Only) Pre-Morbid Rankin Score: Slight disability Modified Rankin: Moderately severe disability    Exercises     PT Diagnosis: Difficulty walking  PT Problem List: Decreased strength;Decreased range of motion;Decreased activity tolerance;Decreased balance;Decreased mobility;Decreased knowledge of use of DME;Cardiopulmonary status limiting activity PT Treatment Interventions: DME instruction;Gait training;Functional mobility training;Therapeutic activities;Balance training;Patient/family education     PT Goals(Current goals can be found in the care plan section) Acute Rehab PT Goals Patient Stated Goal: work in the garden; get down basement stairs to do laundry; walk without a rolling walker PT Goal Formulation: With patient Time For Goal Achievement: 10/12/12 Potential to Achieve Goals: Good  Visit Information  Last PT Received On: 09/28/12 Assistance Needed: +1       Prior Functioning  Home Living Family/patient expects to be discharged to:: Private residence Living Arrangements: Spouse/significant other Available Help at Discharge: Available 24 hours/day Type of Home: House Home Access: Stairs to enter [employer] of Steps: 1 (threshold) Entrance Stairs-Rails: None Home Layout: Multi-level;Laundry or work area in basement;Able to live on main level with bedroom/bathroom Alternate Teacher, music of Steps: flight Home Equipment: Gilmer Mor - single point Additional Comments: reports using cane for outdoor ambulation Prior Function Level of Independence: Independent;Independent with assistive device(s) Communication Communication: HOH    Cognition  Cognition Arousal/Alertness: Awake/alert Behavior During Therapy: WFL for tasks assessed/performed Overall Cognitive Status: Within Functional Limits for tasks  assessed    Extremity/Trunk Assessment Upper Extremity Assessment Upper Extremity Assessment: Defer to OT evaluation Lower Extremity Assessment Lower Extremity Assessment: Overall WFL for tasks assessed Cervical / Trunk Assessment Cervical / Trunk Assessment: Kyphotic   Balance Balance Balance Assessed: Yes Static Standing Balance Single Leg Stance - Right Leg: 10 (seconds) Single Leg Stance - Left Leg: 15 Rhomberg - Eyes Opened: 30 Standardized Balance Assessment Standardized Balance Assessment: Dynamic Gait Index Dynamic Gait Index Level Surface: Mild Impairment Change in Gait Speed: Mild Impairment Gait with Horizontal Head Turns: Mild Impairment Gait with Vertical Head Turns: Mild Impairment Gait and Pivot Turn: Moderate Impairment Step Over Obstacle: Mild Impairment Step Around Obstacles: Normal Steps: Mild Impairment Total Score: 16  End of Session PT - End of Session Equipment Utilized During Treatment: Gait belt Activity Tolerance: Patient tolerated treatment well;Patient limited by fatigue Patient left: in bed;with call bell/phone within reach;with family/visitor present Nurse Communication: Mobility status  GP     Dennis Bast 09/28/2012, 10:46 AM

## 2012-09-28 NOTE — Progress Notes (Signed)
Pt unable to tolerate IV potassium two sites used. Page to M.D to make aware. Infusion stopped.

## 2012-09-29 ENCOUNTER — Telehealth: Payer: Self-pay | Admitting: Vascular Surgery

## 2012-09-29 ENCOUNTER — Ambulatory Visit (HOSPITAL_COMMUNITY): Payer: Medicare Other

## 2012-09-29 ENCOUNTER — Other Ambulatory Visit: Payer: Self-pay | Admitting: *Deleted

## 2012-09-29 ENCOUNTER — Inpatient Hospital Stay (HOSPITAL_COMMUNITY): Payer: Medicare Other

## 2012-09-29 DIAGNOSIS — I4891 Unspecified atrial fibrillation: Secondary | ICD-10-CM

## 2012-09-29 DIAGNOSIS — Z01810 Encounter for preprocedural cardiovascular examination: Secondary | ICD-10-CM

## 2012-09-29 DIAGNOSIS — I509 Heart failure, unspecified: Secondary | ICD-10-CM

## 2012-09-29 DIAGNOSIS — I2510 Atherosclerotic heart disease of native coronary artery without angina pectoris: Secondary | ICD-10-CM

## 2012-09-29 LAB — COMPREHENSIVE METABOLIC PANEL
AST: 24 U/L (ref 0–37)
BUN: 29 mg/dL — ABNORMAL HIGH (ref 6–23)
CO2: 30 mEq/L (ref 19–32)
Calcium: 9.6 mg/dL (ref 8.4–10.5)
Creatinine, Ser: 1.43 mg/dL — ABNORMAL HIGH (ref 0.50–1.35)
GFR calc Af Amer: 53 mL/min — ABNORMAL LOW (ref >90)
GFR calc non Af Amer: 46 mL/min — ABNORMAL LOW (ref >90)
Glucose, Bld: 117 mg/dL — ABNORMAL HIGH (ref 70–99)

## 2012-09-29 LAB — PRO B NATRIURETIC PEPTIDE: Pro B Natriuretic peptide (BNP): 5428 pg/mL — ABNORMAL HIGH (ref 0–450)

## 2012-09-29 LAB — MAGNESIUM: Magnesium: 2.1 mg/dL (ref 1.5–2.5)

## 2012-09-29 NOTE — Telephone Encounter (Signed)
Message copied by Jena Gauss on Mon Sep 29, 2012 11:48 AM ------      Message from: Melene Plan      Created: Mon Sep 29, 2012 10:50 AM                   ----- Message -----         From: Sherren Kerns, MD         Sent: 09/28/2012   2:59 PM           To: Reuel Derby, Melene Plan, RN, #            Level 5 consult       Anne Hahn requesting symptomatic carotid      He needs follow up with me Aug 31 with repeat carotid duplex.            Charles ------

## 2012-09-29 NOTE — Evaluation (Signed)
Speech Language Pathology Evaluation Patient Details Name: Trevor Simpson MRN: [id number] DOB: [birth year] Today's Date: 09/29/2012 Time: 0940-1010 SLP Time Calculation (min): 30 min  Problem List:  Patient Active Problem List   Diagnosis Date Noted  . CVA (cerebral infarction) 09/27/2012  . Anxiety 09/20/2012  . Shortness of breath 09/20/2012  . Intermediate coronary syndrome 09/18/2012  . Cardiomyopathy, ischemic 09/14/2012  . Systolic congestive heart failure   . Cardiomyopathy   . Acute systolic congestive heart failure, NYHA class 4 09/12/2012  . Hyponatremia 09/12/2012  . CAROTID ARTERY DISEASE 12/16/2009  . PACEMAKER-St.Jude 11/22/2009  . Other and unspecified hyperlipidemia 06/30/2009  . HYPERTENSION 06/30/2009  . CAD 06/30/2009  . ATRIAL FIBRILLATION, PAROXYSMAL 06/30/2009  . BRADYCARDIA-TACHYCARDIA SYNDROME 06/30/2009   Past Medical History:  Past Medical History  Diagnosis Date  . Coronary artery disease   . Other and unspecified hyperlipidemia   . Hypertension   . Atrial fibrillation   . Sinoatrial node dysfunction   . PVD (peripheral vascular disease)     s/p renal artery stent 20-04  . Pacemaker st Jude   . AAA (abdominal aortic aneurysm)     repaired  . Systolic congestive heart failure   . Cardiomyopathy    Past Surgical History:  Past Surgical History  Procedure Laterality Date  . Inguinal hernia repair      left  . Cardiac pacemaker placement  08/01/2009    st jude dual chamber  . Colonoscopy    . Esophagogastroduodenoscopy    . Translumminal angioplasty    . Tee without cardioversion N/A 09/23/2012    Procedure: TRANSESOPHAGEAL ECHOCARDIOGRAM (TEE);  Surgeon: Lewayne Bunting, MD;  Location: [facility] ENDOSCOPY;  Service: Cardiovascular;  Laterality: N/A;  . Cardioversion N/A 09/23/2012    Procedure: CARDIOVERSION;  Surgeon: Lewayne Bunting, MD;  Location: [facility] ENDOSCOPY;  Service: Cardiovascular;  Laterality: N/A;   HPI:  Trevor Simpson is a 79  y.o. male presenting with left hemisensory deficits. TPA was not given secondary to the fact that the patient was already on Xarelto.  Imaging confirms no acute infarct. The patient is unable to have an MRI secondary to a permanent pacemaker.  Infarct felt to be embolic secondary to atrial fibrillation.     Assessment / Plan / Recommendation Clinical Impression  Pts cognitive lingusitic fucntion at baseline. Possible mild dysarthria, pt 100% intelligible. SLP offered compensatory strategies for clear speech. No SLP f/u needed will sign off.     SLP Assessment  Patient does not need any further Speech Lanaguage Pathology Services    Follow Up Recommendations  None    Frequency and Duration        Pertinent Vitals/Pain NA   SLP Goals     SLP Evaluation Prior Functioning  Cognitive/Linguistic Baseline: Within functional limits Type of Home: House  Lives With: Spouse Available Help at Discharge: Available 24 hours/day Vocation: Retired   IT consultant  Overall Cognitive Status: Within Functional Limits for tasks assessed Orientation Level: Oriented X4    Comprehension  Auditory Comprehension Overall Auditory Comprehension: Appears within functional limits for tasks assessed    Expression Verbal Expression Overall Verbal Expression: Appears within functional limits for tasks assessed   Oral / Motor Oral Motor/Sensory Function Overall Oral Motor/Sensory Function: Appears within functional limits for tasks assessed Motor Speech Overall Motor Speech: Other (comment) (slightly imprecise articulation, wife reprots gravelly voice)   GO    Harlon Ditty, [place] CCC-SLP [phone]  Claudine Mouton 09/29/2012, 10:16 AM

## 2012-09-29 NOTE — Progress Notes (Signed)
[facility] PROGRESS NOTE  Trevor Simpson ZOX:[id number] DOB: [birth year] DOA: 09/27/2012 PCP: Pearson Grippe, MD  Assessment/Plan: CVA:  - Suspected CVA  - for now, continue plavix and hold xarelto per Neuro recs  - Neuro following  - Given hx of pacemaker, cannot order MRI  - CTA neck with 82% ICA stenosis on R  - Vascular Surgery consulted - Did not order 2d echo given TEE done recently  - Serial neuro checks  - PT/OT/SLP  HTN:  - Currently stable and controlled  - given hx of CAD and chf, consider resuming meds for now  Chronic systolic CHF  - Followed by [facility]  - Will consult for pre-op risk assessment and to optimize meds - Currently compensated  - Monitor I/O's  - Cont diuretics for now.  - Recent cardiology note reviewed. Plans were for close follow up at CHF clinic next week. Should chf become decompensated, consider Cardiology consult  A.fib:  - Rate controlled  - Paced  CAD:  - Asymptomatic  - Cont to monitor  DVT prophylaxis:  - On therapeutic anticoagulation Hypokalemia: - Replaced - Cont to follow and replace as needed  Code Status: Full Family Communication: Pt in room (indicate person spoken with, relationship, and if by phone, the number) Disposition Plan: Home with HH/home PT  Consultants:  Neurology  Vascular surgery  HPI/Subjective: Reports pain with IV potassium given last night, since tolerated oral form instead. No other complaints.  Objective: Filed Vitals:   09/28/12 2124 09/29/12 0145 09/29/12 0433 09/29/12 0554  BP: 103/65 111/63  111/61  Pulse: 66 75  73  Temp: 97.9 F (36.6 C) 97.3 F (36.3 C)  97.7 F (36.5 C)  TempSrc: Oral Oral  Oral  Resp: 16 16  18   Height:      Weight:   90.2 kg (198 lb 13.7 oz)   SpO2: 97% 97%  96%   No intake or output data in the 24 hours ending 09/29/12 0801 Filed Weights   09/27/12 1436 09/28/12 0500 09/29/12 0433  Weight: 89.8 kg (197 lb 15.6 oz) 88.6 kg (195 lb 5.2 oz) 90.2  kg (198 lb 13.7 oz)    Exam:   General:  Awake, in nad  Cardiovascular: regular, s1, s2  Respiratory: normal resp effort, no wheezing  Abdomen: soft, nondistended  Musculoskeletal: perfused, no clubbing or cyanosis   Data Reviewed: Basic Metabolic Panel:  Recent Labs Lab 09/25/12 1720 09/27/12 1223 09/27/12 1235 09/28/12 0650 09/29/12 0625  NA 132* 128* 134* 137 133*  K 3.5 2.7* 2.9* 2.9* 3.9  CL 90* 83* 90* 92* 93*  CO2 27 33*  --  33* 30  GLUCOSE 167* 144* 145* 127* 117*  BUN 38* 34* 31* 25* 29*  CREATININE 1.47* 1.32 1.60* 1.41* 1.43*  CALCIUM 9.8 9.9  --  10.0 9.6  MG  --   --   --   --  2.1   Liver Function Tests:  Recent Labs Lab 09/27/12 1223 09/28/12 0650 09/29/12 0625  AST 28 27 24   ALT 23 21 19   ALKPHOS 66 67 64  BILITOT 2.8* 2.4* 2.0*  PROT 7.4 7.3 7.2  ALBUMIN 3.9 3.7 3.5   No results found for this basename: LIPASE, AMYLASE,  in the last 168 hours No results found for this basename: AMMONIA,  in the last 168 hours CBC:  Recent Labs Lab 09/25/12 1720 09/27/12 1223 09/27/12 1235 09/28/12 0650  WBC 8.0 8.2  --  9.8  NEUTROABS  --  6.4  --   --   HGB 14.8 15.0 16.7 15.6  HCT 44.4 44.0 49.0 45.3  MCV 94.3 93.2  --  93.6  PLT 149* 152  --  158   Cardiac Enzymes:  Recent Labs Lab 09/27/12 1224  TROPONINI <0.30   BNP (last 3 results)  Recent Labs  09/12/12 1834 09/19/12 2159 09/25/12 1720  PROBNP 4730.0* 9116.0* 7086.0*   CBG:  Recent Labs Lab 09/27/12 1207 09/27/12 1236  GLUCAP 133* 162*    No results found for this or any previous visit (from the past 240 hour(s)).   Studies: Ct Angio Head W/cm &/or Wo Cm  09/27/2012   *RADIOLOGY REPORT*  Clinical Data:  DIZZINESS.  ATRIAL FIBRILLATION.  HIGH BLOOD PRESSURE.  PACEMAKER IN PLACE.  CARDIOMYOPATHY.  NUMBNESS LEFT SIDE.  CT ANGIOGRAPHY HEAD AND NECK  Technique:  Multidetector CT imaging of the head and neck was performed using the standard protocol during bolus  administration of intravenous contrast.  Multiplanar CT image reconstructions including MIPs were obtained to evaluate the vascular anatomy. Carotid stenosis measurements (when applicable) are obtained utilizing NASCET criteria, using the distal internal carotid diameter as the denominator.  Contrast: 50mL OMNIPAQUE IOHEXOL 350 MG/ML SOLN  Comparison:  09/27/2012 head CT.  No comparison angiogram.  CTA NECK  Findings:  Bilateral pleural effusions.  Irregular shaggy plaque of the visualized aspect of the thoracic aorta (most notable distal arch and descending thoracic aorta). Ectatic ascending thoracic aorta measures up to 3.7 cm.  Calcified plaque origin of the great vessels with mild narrowing innominate artery, mild to moderate narrowing proximal left common carotid artery and moderate narrowing origin of the left subclavian artery.  Plaque with mild narrowing right common carotid artery.  Right carotid bifurcation calcified irregular plaque with 82% diameter stenosis of the proximal right internal carotid artery.  Mild to slightly moderate narrowing right internal carotid artery just proximal to the skull base.  Plaque of the left common carotid artery.  Left carotid bifurcation plaque with 54% diameter stenosis proximal left internal carotid artery.  Plaque with mild narrowing distal left internal carotid artery vertical cervical segment.  Plaque with mild narrowing and irregularity right subclavian artery.  Mild to slightly moderate narrowing proximal right vertebral artery.  Proximal aspect of the left vertebral artery is not visualized. Minimal flow is seen within the vertical segment possibly from collateral flow.  Fullness of the left posterior-superior nasopharynx may represent coaptation membranes although mucosa abnormality cannot be excluded.  Cervical spondylotic changes with various degrees of spinal stenosis and foraminal narrowing and   Review of the MIP images confirms the above findings.   IMPRESSION: Bilateral pleural effusions.  Irregular shaggy plaque of the visualized aspect of the thoracic aorta (most notable distal arch and descending thoracic aorta). Ectatic ascending thoracic aorta measures up to 3.7 cm.  Calcified plaque origin of the great vessels with mild narrowing innominate artery, mild to moderate narrowing proximal left common carotid artery and moderate narrowing origin of the left subclavian artery.  Right carotid bifurcation calcified irregular plaque with 82% diameter stenosis of the proximal right internal carotid artery.  Left carotid bifurcation plaque with 54% diameter stenosis proximal left internal carotid artery.  Plaque with mild narrowing and irregularity right subclavian artery.  Mild to slightly moderate narrowing proximal right vertebral artery.  Proximal aspect of the left vertebral artery is not visualized. Minimal flow is seen within the vertical segment possibly from collateral flow.  Fullness of the left posterior-superior nasopharynx  may represent coaptation membranes although mucosa abnormality cannot be excluded.  CTA HEAD  Findings:  No intracranial hemorrhage.  No intracranial enhancing lesion.  Small vessel disease type changes most notable anterior right external capsule region.  No CT evidence of large acute infarct. No hydrocephalus.  Right vertebral artery is dominant.  Moderate tandem stenosis of the vertebral arteries.  Mild narrowing of portions of the basilar artery.  Irregularity with narrowing of portions of the PICAs, AICAs, superior cerebral arteries and posterior cerebral arteries.  Petrous and cavernous segment internal carotid artery moderate narrowing and irregularity with calcified plaque.  Mild narrowing M1 segment of the middle cerebral artery more notable on the right.  Aplastic A1 segment right anterior cerebral artery.  Middle cerebral artery and anterior cerebral artery branch vessel irregularity bilaterally.  No aneurysm noted.    Review of the MIP images confirms the above findings.  IMPRESSION: No intracranial hemorrhage.  No intracranial enhancing lesion.  Small vessel disease type changes most notable anterior right external capsule region.  No CT evidence of large acute infarct.  Right vertebral artery is dominant.  Moderate tandem stenosis of the vertebral arteries.  Mild narrowing of portions of the basilar artery.  Irregularity with narrowing of portions of the PICAs, AICAs, superior cerebral arteries and posterior cerebral arteries.  Petrous and cavernous segment internal carotid artery moderate narrowing and irregularity with calcified plaque.  Mild narrowing M1 segment of the middle cerebral artery more notable on the right.  Aplastic A1 segment right anterior cerebral artery.  Middle cerebral artery and anterior cerebral artery branch vessel irregularity bilaterally.   Original Report Authenticated By: Lacy Duverney, M.D.   Ct Head Wo Contrast  09/28/2012   *RADIOLOGY REPORT*  Clinical Data: Stroke.  T-PA not given  CT HEAD WITHOUT CONTRAST  Technique:  Contiguous axial images were obtained from the base of the skull through the vertex without contrast.  Comparison: CT 09/27/2012  Findings: Generalized atrophy with mild chronic microvascular ischemic change in the white matter.  Negative for acute infarct. Negative for hemorrhage or mass lesion.  Calvarium is intact.  IMPRESSION: No acute abnormality and no change from yesterday.   Original Report Authenticated By: Janeece Riggers, M.D.   Ct Head Wo Contrast  09/27/2012   *RADIOLOGY REPORT*  Clinical Data: Code stroke.  Left sided numbness  CT HEAD WITHOUT CONTRAST  Technique:  Contiguous axial images were obtained from the base of the skull through the vertex without contrast.  Comparison: None.  Findings: Mild atrophy.  Negative for hydrocephalus.  No acute infarct.  Negative for hemorrhage or mass lesion.  No acute bony abnormality.  Mild mucosal edema in the maxillary  sinuses.  IMPRESSION: No acute abnormality.   Original Report Authenticated By: Janeece Riggers, M.D.   Ct Angio Neck W/cm &/or Wo/cm  09/27/2012   *RADIOLOGY REPORT*  Clinical Data:  DIZZINESS.  ATRIAL FIBRILLATION.  HIGH BLOOD PRESSURE.  PACEMAKER IN PLACE.  CARDIOMYOPATHY.  NUMBNESS LEFT SIDE.  CT ANGIOGRAPHY HEAD AND NECK  Technique:  Multidetector CT imaging of the head and neck was performed using the standard protocol during bolus administration of intravenous contrast.  Multiplanar CT image reconstructions including MIPs were obtained to evaluate the vascular anatomy. Carotid stenosis measurements (when applicable) are obtained utilizing NASCET criteria, using the distal internal carotid diameter as the denominator.  Contrast: 50mL OMNIPAQUE IOHEXOL 350 MG/ML SOLN  Comparison:  09/27/2012 head CT.  No comparison angiogram.  CTA NECK  Findings:  Bilateral pleural effusions.  Irregular shaggy plaque of the visualized aspect of the thoracic aorta (most notable distal arch and descending thoracic aorta). Ectatic ascending thoracic aorta measures up to 3.7 cm.  Calcified plaque origin of the great vessels with mild narrowing innominate artery, mild to moderate narrowing proximal left common carotid artery and moderate narrowing origin of the left subclavian artery.  Plaque with mild narrowing right common carotid artery.  Right carotid bifurcation calcified irregular plaque with 82% diameter stenosis of the proximal right internal carotid artery.  Mild to slightly moderate narrowing right internal carotid artery just proximal to the skull base.  Plaque of the left common carotid artery.  Left carotid bifurcation plaque with 54% diameter stenosis proximal left internal carotid artery.  Plaque with mild narrowing distal left internal carotid artery vertical cervical segment.  Plaque with mild narrowing and irregularity right subclavian artery.  Mild to slightly moderate narrowing proximal right vertebral artery.   Proximal aspect of the left vertebral artery is not visualized. Minimal flow is seen within the vertical segment possibly from collateral flow.  Fullness of the left posterior-superior nasopharynx may represent coaptation membranes although mucosa abnormality cannot be excluded.  Cervical spondylotic changes with various degrees of spinal stenosis and foraminal narrowing and   Review of the MIP images confirms the above findings.  IMPRESSION: Bilateral pleural effusions.  Irregular shaggy plaque of the visualized aspect of the thoracic aorta (most notable distal arch and descending thoracic aorta). Ectatic ascending thoracic aorta measures up to 3.7 cm.  Calcified plaque origin of the great vessels with mild narrowing innominate artery, mild to moderate narrowing proximal left common carotid artery and moderate narrowing origin of the left subclavian artery.  Right carotid bifurcation calcified irregular plaque with 82% diameter stenosis of the proximal right internal carotid artery.  Left carotid bifurcation plaque with 54% diameter stenosis proximal left internal carotid artery.  Plaque with mild narrowing and irregularity right subclavian artery.  Mild to slightly moderate narrowing proximal right vertebral artery.  Proximal aspect of the left vertebral artery is not visualized. Minimal flow is seen within the vertical segment possibly from collateral flow.  Fullness of the left posterior-superior nasopharynx may represent coaptation membranes although mucosa abnormality cannot be excluded.  CTA HEAD  Findings:  No intracranial hemorrhage.  No intracranial enhancing lesion.  Small vessel disease type changes most notable anterior right external capsule region.  No CT evidence of large acute infarct. No hydrocephalus.  Right vertebral artery is dominant.  Moderate tandem stenosis of the vertebral arteries.  Mild narrowing of portions of the basilar artery.  Irregularity with narrowing of portions of the PICAs,  AICAs, superior cerebral arteries and posterior cerebral arteries.  Petrous and cavernous segment internal carotid artery moderate narrowing and irregularity with calcified plaque.  Mild narrowing M1 segment of the middle cerebral artery more notable on the right.  Aplastic A1 segment right anterior cerebral artery.  Middle cerebral artery and anterior cerebral artery branch vessel irregularity bilaterally.  No aneurysm noted.   Review of the MIP images confirms the above findings.  IMPRESSION: No intracranial hemorrhage.  No intracranial enhancing lesion.  Small vessel disease type changes most notable anterior right external capsule region.  No CT evidence of large acute infarct.  Right vertebral artery is dominant.  Moderate tandem stenosis of the vertebral arteries.  Mild narrowing of portions of the basilar artery.  Irregularity with narrowing of portions of the PICAs, AICAs, superior cerebral arteries and posterior cerebral arteries.  Petrous and cavernous segment internal  carotid artery moderate narrowing and irregularity with calcified plaque.  Mild narrowing M1 segment of the middle cerebral artery more notable on the right.  Aplastic A1 segment right anterior cerebral artery.  Middle cerebral artery and anterior cerebral artery branch vessel irregularity bilaterally.   Original Report Authenticated By: Lacy Duverney, M.D.    Scheduled Meds: . amiodarone  200 mg Oral Daily  . atorvastatin  20 mg Oral Daily  . calcium carbonate  1 tablet Oral BID WC  . clopidogrel  75 mg Oral Q breakfast  . docusate sodium  100 mg Oral BID  . enoxaparin (LOVENOX) injection  40 mg Subcutaneous Q24H  . furosemide  80 mg Oral Daily  . levothyroxine  112 mcg Oral QAC breakfast  . loratadine  10 mg Oral Daily  . magnesium gluconate  500 mg Oral Daily  . metoprolol  50 mg Oral BID  . multivitamin with minerals  1 tablet Oral Daily  . pantoprazole  40 mg Oral Daily  . potassium chloride  40 mEq Oral BID  . senna  1  tablet Oral BID  . sodium chloride  3 mL Intravenous Q12H   Continuous Infusions:   Principal Problem:   CVA (cerebral infarction) Active Problems:   Other and unspecified hyperlipidemia   HYPERTENSION   CAD   ATRIAL FIBRILLATION, PAROXYSMAL   CAROTID ARTERY DISEASE    Time spent:    Naliyah Neth K  [facility] Pager [phone]. If 7PM-7AM, please contact night-coverage at [email or web address], password [email or web address] 09/29/2012, 8:01 AM  LOS: 2 days

## 2012-09-29 NOTE — Consult Note (Addendum)
Patient ID: Trevor Simpson MRN: [id number], DOB/AGE: 11-15-1933   Admit date: 09/27/2012 Date of Consult: @TODAY @  Primary Physician: Pearson Grippe, MD Primary Cardiologist: Jerrell Mylar   Problem List: Past Medical History  Diagnosis Date  . Coronary artery disease   . Other and unspecified hyperlipidemia   . Hypertension   . Atrial fibrillation   . Sinoatrial node dysfunction   . PVD (peripheral vascular disease)     s/p renal artery stent 20-04  . Pacemaker st Jude   . AAA (abdominal aortic aneurysm)     repaired  . Systolic congestive heart failure   . Cardiomyopathy     Past Surgical History  Procedure Laterality Date  . Inguinal hernia repair      left  . Cardiac pacemaker placement  08/01/2009    st jude dual chamber  . Colonoscopy    . Esophagogastroduodenoscopy    . Translumminal angioplasty    . Tee without cardioversion N/A 09/23/2012    Procedure: TRANSESOPHAGEAL ECHOCARDIOGRAM (TEE);  Surgeon: Lewayne Bunting, MD;  Location: [facility] ENDOSCOPY;  Service: Cardiovascular;  Laterality: N/A;  . Cardioversion N/A 09/23/2012    Procedure: CARDIOVERSION;  Surgeon: Lewayne Bunting, MD;  Location: [facility] ENDOSCOPY;  Service: Cardiovascular;  Laterality: N/A;     Allergies:  Allergies  Allergen Reactions  . Codeine     "tripped out on it"  . Morphine     "tripped out on it"    HPI:  Patient is a 79 yo with a history of CAD  Asked to see re preop risk stratification. The patient was hospitalized in late June 2014 for progressive dypsnea and worsening LV function on echo  He underwent R and L heart catheterization on 09/15/12.  This showed normal R heart pressures.  L heart cath showed 30% LAD; Ramus was large and had a 90 to 95% stenosis in prox portion.  LCx was rel small.  Om1 had diffuse 60 to 70% stenosis.  RCA was occluded proximaly  Filled via R to R and L to R collaterals.   :VEF est at 20 to 25%.  The patient underwent PTCA/Bare metal stent to ramus.  Plavix and 81 mg  ASA recomm for 1 month.  The patient also underwent TEE cardioversion after this.    Patient presented to ER on 7/10  Found to be in rapid afib and reverted to SR  Diuresed.  Amiodarone was increased to 400 mg per day.    The patient presented to ER on 7/12 with L sided numbness and ataxia.  CT showed there was signficant stenosis of RICA.  The patient has been evaluated by C Fields ([facility])  For consideration of carotid stenting vs. CEA.  He requested cardiac risk stratification.   Patient denies CP  Says his breathing is better than before the PTCA/stent.    Inpatient Medications:  . amiodarone  200 mg Oral Daily  . atorvastatin  20 mg Oral Daily  . calcium carbonate  1 tablet Oral BID WC  . clopidogrel  75 mg Oral Q breakfast  . docusate sodium  100 mg Oral BID  . enoxaparin (LOVENOX) injection  40 mg Subcutaneous Q24H  . furosemide  80 mg Oral Daily  . levothyroxine  112 mcg Oral QAC breakfast  . loratadine  10 mg Oral Daily  . magnesium gluconate  500 mg Oral Daily  . metoprolol  50 mg Oral BID  . multivitamin with minerals  1 tablet Oral Daily  . pantoprazole  40 mg Oral Daily  . potassium chloride  40 mEq Oral BID  . senna  1 tablet Oral BID  . sodium chloride  3 mL Intravenous Q12H    Family History  Problem Relation Age of Onset  . CAD Father   . CAD Brother   . Hypertension Father   . Hypertension Brother   . Diabetes Brother   . Lung cancer Sister      History   Social History  . Marital Status: Married    Spouse Name: N/A    Number of Children: N/A  . Years of Education: N/A   Occupational History  . Not on file.   Social History Main Topics  . Smoking status: Former Smoker    Quit date: 03/19/1993  . Smokeless tobacco: Former Neurosurgeon    Types: Chew  . Alcohol Use: No  . Drug Use: No  . Sexually Active: Not on file   Other Topics Concern  . Not on file   Social History Narrative  . No narrative on file     Review of Systems: All other systems  reviewed and are otherwise negative except as noted above.  Physical Exam: Filed Vitals:   09/29/12 0554  BP: 111/61  Pulse: 73  Temp: 97.7 F (36.5 C)  Resp: 18   No intake or output data in the 24 hours ending 09/29/12 0945  General: Well developed, well nourished, in no acute distress. Head: Normocephalic, atraumatic, sclera non-icteric Neck: Negative for carotid bruits. JVP not elevated. Lungs: Clear bilaterally to auscultation without wheezes, rales, or rhonchi. Breathing is unlabored. Heart: RRR with S1 S2. No murmurs, rubs, or gallops appreciated. Abdomen: Soft, non-tender, non-distended with normoactive bowel sounds. No hepatomegaly. No rebound/guarding. No obvious abdominal masses. Msk:  Strength and tone appears normal for age. Extremities: No clubbing, cyanosis or edema.  Distal pedal pulses are 2+ and equal bilaterally. Neuro: Alert and oriented X 3. Moves all extremities spontaneously. Psych:  Responds to questions appropriately with a normal affect.  Labs: Results for orders placed during the hospital encounter of 09/27/12 (from the past 24 hour(s))  COMPREHENSIVE METABOLIC PANEL     Status: Abnormal   Collection Time    09/29/12  6:25 AM      Result Value Range   Sodium 133 (*) 135 - 145 mEq/L   Potassium 3.9  3.5 - 5.1 mEq/L   Chloride 93 (*) 96 - 112 mEq/L   CO2 30  19 - 32 mEq/L   Glucose, Bld 117 (*) 70 - 99 mg/dL   BUN 29 (*) 6 - 23 mg/dL   Creatinine, Ser 1.61 (*) 0.50 - 1.35 mg/dL   Calcium 9.6  8.4 - 09.6 mg/dL   Total Protein 7.2  6.0 - 8.3 g/dL   Albumin 3.5  3.5 - 5.2 g/dL   AST 24  0 - 37 U/L   ALT 19  0 - 53 U/L   Alkaline Phosphatase 64  39 - 117 U/L   Total Bilirubin 2.0 (*) 0.3 - 1.2 mg/dL   GFR calc non Af Amer 46 (*) >90 mL/min   GFR calc Af Amer 53 (*) >90 mL/min  MAGNESIUM     Status: None   Collection Time    09/29/12  6:25 AM      Result Value Range   Magnesium 2.1  1.5 - 2.5 mg/dL    Radiology/Studies: Ct Angio Head W/cm &/or  Wo Cm  09/27/2012   *RADIOLOGY REPORT*  Clinical  Data:  DIZZINESS.  ATRIAL FIBRILLATION.  HIGH BLOOD PRESSURE.  PACEMAKER IN PLACE.  CARDIOMYOPATHY.  NUMBNESS LEFT SIDE.  CT ANGIOGRAPHY HEAD AND NECK  Technique:  Multidetector CT imaging of the head and neck was performed using the standard protocol during bolus administration of intravenous contrast.  Multiplanar CT image reconstructions including MIPs were obtained to evaluate the vascular anatomy. Carotid stenosis measurements (when applicable) are obtained utilizing NASCET criteria, using the distal internal carotid diameter as the denominator.  Contrast: 50mL OMNIPAQUE IOHEXOL 350 MG/ML SOLN  Comparison:  09/27/2012 head CT.  No comparison angiogram.  CTA NECK  Findings:  Bilateral pleural effusions.  Irregular shaggy plaque of the visualized aspect of the thoracic aorta (most notable distal arch and descending thoracic aorta). Ectatic ascending thoracic aorta measures up to 3.7 cm.  Calcified plaque origin of the great vessels with mild narrowing innominate artery, mild to moderate narrowing proximal left common carotid artery and moderate narrowing origin of the left subclavian artery.  Plaque with mild narrowing right common carotid artery.  Right carotid bifurcation calcified irregular plaque with 82% diameter stenosis of the proximal right internal carotid artery.  Mild to slightly moderate narrowing right internal carotid artery just proximal to the skull base.  Plaque of the left common carotid artery.  Left carotid bifurcation plaque with 54% diameter stenosis proximal left internal carotid artery.  Plaque with mild narrowing distal left internal carotid artery vertical cervical segment.  Plaque with mild narrowing and irregularity right subclavian artery.  Mild to slightly moderate narrowing proximal right vertebral artery.  Proximal aspect of the left vertebral artery is not visualized. Minimal flow is seen within the vertical segment possibly from  collateral flow.  Fullness of the left posterior-superior nasopharynx may represent coaptation membranes although mucosa abnormality cannot be excluded.  Cervical spondylotic changes with various degrees of spinal stenosis and foraminal narrowing and   Review of the MIP images confirms the above findings.  IMPRESSION: Bilateral pleural effusions.  Irregular shaggy plaque of the visualized aspect of the thoracic aorta (most notable distal arch and descending thoracic aorta). Ectatic ascending thoracic aorta measures up to 3.7 cm.  Calcified plaque origin of the great vessels with mild narrowing innominate artery, mild to moderate narrowing proximal left common carotid artery and moderate narrowing origin of the left subclavian artery.  Right carotid bifurcation calcified irregular plaque with 82% diameter stenosis of the proximal right internal carotid artery.  Left carotid bifurcation plaque with 54% diameter stenosis proximal left internal carotid artery.  Plaque with mild narrowing and irregularity right subclavian artery.  Mild to slightly moderate narrowing proximal right vertebral artery.  Proximal aspect of the left vertebral artery is not visualized. Minimal flow is seen within the vertical segment possibly from collateral flow.  Fullness of the left posterior-superior nasopharynx may represent coaptation membranes although mucosa abnormality cannot be excluded.  CTA HEAD  Findings:  No intracranial hemorrhage.  No intracranial enhancing lesion.  Small vessel disease type changes most notable anterior right external capsule region.  No CT evidence of large acute infarct. No hydrocephalus.  Right vertebral artery is dominant.  Moderate tandem stenosis of the vertebral arteries.  Mild narrowing of portions of the basilar artery.  Irregularity with narrowing of portions of the PICAs, AICAs, superior cerebral arteries and posterior cerebral arteries.  Petrous and cavernous segment internal carotid artery moderate  narrowing and irregularity with calcified plaque.  Mild narrowing M1 segment of the middle cerebral artery more notable on the right.  Aplastic  A1 segment right anterior cerebral artery.  Middle cerebral artery and anterior cerebral artery branch vessel irregularity bilaterally.  No aneurysm noted.   Review of the MIP images confirms the above findings.  IMPRESSION: No intracranial hemorrhage.  No intracranial enhancing lesion.  Small vessel disease type changes most notable anterior right external capsule region.  No CT evidence of large acute infarct.  Right vertebral artery is dominant.  Moderate tandem stenosis of the vertebral arteries.  Mild narrowing of portions of the basilar artery.  Irregularity with narrowing of portions of the PICAs, AICAs, superior cerebral arteries and posterior cerebral arteries.  Petrous and cavernous segment internal carotid artery moderate narrowing and irregularity with calcified plaque.  Mild narrowing M1 segment of the middle cerebral artery more notable on the right.  Aplastic A1 segment right anterior cerebral artery.  Middle cerebral artery and anterior cerebral artery branch vessel irregularity bilaterally.   Original Report Authenticated By: Lacy Duverney, M.D.   Dg Chest 2 View  09/25/2012   *RADIOLOGY REPORT*  Clinical Data: Cough.  Recent stent and thoracentesis.  CHEST - 2 VIEW  Comparison: Radiographs 09/19/2012.  CT 09/12/2010.  Findings: Left subclavian pacemaker leads are unchanged within the right atrium and right ventricle.  Heart size and mediastinal contours are stable.  The pleural effusions have decreased in volume.  There is mild edema and bibasilar atelectasis.  No pneumothorax is evident.  IMPRESSION: Improved pleural effusions with residual mild edema and bibasilar atelectasis.  No new findings demonstrated.   Original Report Authenticated By: Carey Bullocks, M.D.   Ct Head Wo Contrast  09/28/2012   *RADIOLOGY REPORT*  Clinical Data: Stroke.  T-PA not  given  CT HEAD WITHOUT CONTRAST  Technique:  Contiguous axial images were obtained from the base of the skull through the vertex without contrast.  Comparison: CT 09/27/2012  Findings: Generalized atrophy with mild chronic microvascular ischemic change in the white matter.  Negative for acute infarct. Negative for hemorrhage or mass lesion.  Calvarium is intact.  IMPRESSION: No acute abnormality and no change from yesterday.   Original Report Authenticated By: Janeece Riggers, M.D.   Ct Head Wo Contrast  09/27/2012   *RADIOLOGY REPORT*  Clinical Data: Code stroke.  Left sided numbness  CT HEAD WITHOUT CONTRAST  Technique:  Contiguous axial images were obtained from the base of the skull through the vertex without contrast.  Comparison: None.  Findings: Mild atrophy.  Negative for hydrocephalus.  No acute infarct.  Negative for hemorrhage or mass lesion.  No acute bony abnormality.  Mild mucosal edema in the maxillary sinuses.  IMPRESSION: No acute abnormality.   Original Report Authenticated By: Janeece Riggers, M.D.   EKG:  Atrial fib 79 bpm  Occasional paced beats  LBBB  ASSESSMENT AND PLAN: Patient is a 79 yo with a history of CAD, afib, s/p PPM  Admitted with CVA on 7/12.  We are asked to see re preop risk stratification for possible CEA.   On exam he has very mild volume increase.  Tele difficult but rates controlled.  EKG shows atrial fib (just had TEE/cardioversion last week) Overall he needs to get past 7/30 before surgical intervention so that plavix can be held   He is in some increased risk for fluid overload and ischemia with planned surgery (CEA) but I feel not prohibitive.  Can be managed medically.   I would repeat CXR and get BNP.  Continue medicnies for now. If plans to d/c will make sure that he has  appt in cardiology for f/u next week.    2.  CAD  As noted above.  Continue plavix until 7/30  Keep on ASA 81 mg  3.  CHF  Patient with severe LV dysfunction. On metoprolol.  Not on ACEI. Will  be reevaluated as outpatient  Do not want to lower BP signif given recent neuro event.   3.  Atrial fib  Will have PM rep interrogate pacer.  He is now off Xarelto per neurology for a few days.   Question when will be resumed.  Should have planned out.  Patient in/out of Afib.   Patient has appt with L Gerhardt at [facility] care on 7/29 at 11:45. Signed, Dietrich Pates 09/29/2012, 9:45 AM   Addend:  Patient's pacer interrogated.  SInce last week he has been back in an out of afib.  Currently AV paced. Pacer reprogrammed to not have rate responsivienss.    Dietrich Pates

## 2012-09-29 NOTE — Progress Notes (Signed)
Stroke Team Progress Note  HISTORY Trevor Simpson is a 79 y.o. male with a history of CHF, A. fib who takes xarelto as well as Plavix. On 09/27/12 he woke up around 6 AM and felt okay. Then went back to sleep around 7 AM got up and found that he was unable to walk. He also noticed that he was numb on the left side.   LKW: 6 AM 09/27/12 tpa given: No, anticoagulated  NIH : 3   SUBJECTIVE   OBJECTIVE Most recent Vital Signs: Filed Vitals:   09/28/12 2124 09/29/12 0145 09/29/12 0433 09/29/12 0554  BP: 103/65 111/63  111/61  Pulse: 66 75  73  Temp: 97.9 F (36.6 C) 97.3 F (36.3 C)  97.7 F (36.5 C)  TempSrc: Oral Oral  Oral  Resp: 16 16  18   Height:      Weight:   90.2 kg (198 lb 13.7 oz)   SpO2: 97% 97%  96%   CBG (last 3)   Recent Labs  09/27/12 1207 09/27/12 1236  GLUCAP 133* 162*    IV Fluid Intake:     MEDICATIONS  . amiodarone  200 mg Oral Daily  . atorvastatin  20 mg Oral Daily  . calcium carbonate  1 tablet Oral BID WC  . clopidogrel  75 mg Oral Q breakfast  . docusate sodium  100 mg Oral BID  . enoxaparin (LOVENOX) injection  40 mg Subcutaneous Q24H  . furosemide  80 mg Oral Daily  . levothyroxine  112 mcg Oral QAC breakfast  . loratadine  10 mg Oral Daily  . magnesium gluconate  500 mg Oral Daily  . metoprolol  50 mg Oral BID  . multivitamin with minerals  1 tablet Oral Daily  . pantoprazole  40 mg Oral Daily  . potassium chloride  40 mEq Oral BID  . senna  1 tablet Oral BID  . sodium chloride  3 mL Intravenous Q12H   PRN:  acetaminophen, acetaminophen, nitroGLYCERIN, ondansetron (ZOFRAN) IV, ondansetron, polyethylene glycol  Diet:  Cardiac thin liquids Activity: Up with assistance DVT Prophylaxis:  Lovenox  CLINICALLY SIGNIFICANT STUDIES Basic Metabolic Panel:   Recent Labs Lab 09/28/12 0650 09/29/12 0625  NA 137 133*  K 2.9* 3.9  CL 92* 93*  CO2 33* 30  GLUCOSE 127* 117*  BUN 25* 29*  CREATININE 1.41* 1.43*  CALCIUM 10.0 9.6  MG   --  2.1   Liver Function Tests:   Recent Labs Lab 09/28/12 0650 09/29/12 0625  AST 27 24  ALT 21 19  ALKPHOS 67 64  BILITOT 2.4* 2.0*  PROT 7.3 7.2  ALBUMIN 3.7 3.5   CBC:   Recent Labs Lab 09/27/12 1223 09/27/12 1235 09/28/12 0650  WBC 8.2  --  9.8  NEUTROABS 6.4  --   --   HGB 15.0 16.7 15.6  HCT 44.0 49.0 45.3  MCV 93.2  --  93.6  PLT 152  --  158   Coagulation:   Recent Labs Lab 09/27/12 1223  LABPROT 27.5*  INR 2.67*   Cardiac Enzymes:   Recent Labs Lab 09/27/12 1224  TROPONINI <0.30   Urinalysis:   Recent Labs Lab 09/27/12 1701  COLORURINE YELLOW  LABSPEC 1.013  PHURINE 7.5  GLUCOSEU NEGATIVE  HGBUR NEGATIVE  BILIRUBINUR NEGATIVE  KETONESUR NEGATIVE  PROTEINUR NEGATIVE  UROBILINOGEN 0.2  NITRITE NEGATIVE  LEUKOCYTESUR NEGATIVE   Lipid Panel No results found for this basename: chol,  trig,  hdl,  cholhdl,  vldl,  ldlcalc   HgbA1C  No results found for this basename: HGBA1C    Urine Drug Screen:     Component Value Date/Time   LABOPIA NONE DETECTED 09/27/2012 1701   COCAINSCRNUR NONE DETECTED 09/27/2012 1701   LABBENZ NONE DETECTED 09/27/2012 1701   AMPHETMU NONE DETECTED 09/27/2012 1701   THCU NONE DETECTED 09/27/2012 1701   LABBARB NONE DETECTED 09/27/2012 1701    Alcohol Level:   Recent Labs Lab 09/27/12 1223  ETH <11     CTA NECK 09/27/2012  Bilateral pleural effusions.  Irregular shaggy plaque of the visualized aspect of the thoracic aorta (most notable distal arch and descending thoracic aorta). Ectatic ascending thoracic aorta measures up to 3.7 cm.  Calcified plaque origin of the great vessels with mild narrowing innominate artery, mild to moderate narrowing proximal left common carotid artery and moderate narrowing origin of the left subclavian artery.  Right carotid bifurcation calcified irregular plaque with 82% diameter stenosis of the proximal right internal carotid artery.  Left carotid bifurcation plaque with 54%  diameter stenosis proximal left internal carotid artery.  Plaque with mild narrowing and irregularity right subclavian artery.  Mild to slightly moderate narrowing proximal right vertebral artery.  Proximal aspect of the left vertebral artery is not visualized. Minimal flow is seen within the vertical segment possibly from collateral flow.  Fullness of the left posterior-superior nasopharynx may represent coaptation membranes although mucosa abnormality cannot be excluded.    CTA HEAD  09/27/2012  No intracranial hemorrhage.  No intracranial enhancing lesion.  Small vessel disease type changes most notable anterior right external capsule region.  No CT evidence of large acute infarct.  Right vertebral artery is dominant.  Moderate tandem stenosis of the vertebral arteries.  Mild narrowing of portions of the basilar artery.  Irregularity with narrowing of portions of the PICAs, AICAs, superior cerebral arteries and posterior cerebral arteries.  Petrous and cavernous segment internal carotid artery moderate narrowing and irregularity with calcified plaque.  Mild narrowing M1 segment of the middle cerebral artery more notable on the right.  Aplastic A1 segment right anterior cerebral artery.  Middle cerebral artery and anterior cerebral artery branch vessel irregularity bilaterally.   Ct Head Wo Contrast 09/27/2012 No acute abnormality.     MRI of the brain  the patient has a pacemaker  MRA of the brain  the patient has a pacemaker  2D Echocardiogram  09/14/2012 EF 25-3and0% with no source  Compared to prior study April 2011 there has been reduction in LV systolic function, now LVEF 25-30% with apparent inferior WMA, and septal dyssynchrony. Moderate diastolic dysfunction. Signfiicant left atrial enlargement. Mitral regurgitation has progressed to moderate range. Pacer wire noted in right heart. Moderate RV enlargement.Marland Kitchen PASP 27 mmHg with increased CVP.   TEE 09/23/12 - ejection fraction 20-25%. No cardiac  source of emboli was identified.  Carotid Doppler   see CTA  of the neck   CXR  Improved pleural effusions with residual mild edema and bibasilar atelectasis. No new findings demonstrated.   EKG  - atrial fibrillation ventricular response 82 beats per minute.  Therapy Recommendations home health PT, no OT needs  Physical Exam   General - the pleasant 79 year old male in no acute distress Heart - irregularly irregular - no murmer noted Lungs - Clear to auscultation but decreased Skin - Warm and dry  NEUROLOGIC:  MENTAL STATUS: awake, alert, oriented, language fluent,  follows simple commands.  CRANIAL NERVES: pupils equal and reactive to light,extraocular muscles  intact, facial sensation and strength symmetric, uvula midlinec, tongue midline MOTOR: normal bulk and tone, Strength - 5 over 5 throughout SENSORY: normal and symmetric to light touch  COORDINATION: finger-nose-finger normal    ASSESSMENT [name] is a 79 y.o. male presenting with left hemisensory deficits. TPA was not given secondary to the fact that the patient was already on Xarelto.  Imaging confirms no acute infarct. The patient is unable to have an MRI secondary to a permanent pacemaker.  Infarct felt to be embolic secondary to atrial fibrillation.  On clopidogrel 75 mg orally every day and Xarelto prior to admission. Now on clopidogrel 75 mg orally every day for secondary stroke prevention. Patient with resolution of deficits. Work up underway.   82% right internal carotid artery stenosis / plaque.  Left ventricular dysfunction with ejection fraction 20-25%  Atrial fibrillation with permanent pacemaker  Chronic congestive heart failure and coronary artery disease.  Hypertension  Hyperlipidemia  Renal insufficiency with hypokalemia  Hospital day # 2  TREATMENT/PLANthis is a  Resume xarelto for secondary stroke prevention from afib as well as continue clopidogrel 75 mg orally every day for  secondary stroke prevention given carotid stenosis  Optimization of heart failure management  Right carotid endarterectomy in 2-4 weeks as per [facility]   No further neurologic workup indicated  Followup with Dr. Pearlean Brownie in 2 months  Dr. Pearlean Brownie discussed diagnosis, prognosis,  treatment options and plan of care with  Patient and Dr. Darrick Penna via telephone.  Annie Main, MSN, RN, ANVP-BC, ANP-BC, [facility] Pager: [phone] 09/29/2012 4:11 PM  I have personally obtained a history, examined the patient, evaluated imaging results, and formulated the assessment and plan of care. I agree with the above. Delia Heady, MD

## 2012-09-29 NOTE — Progress Notes (Signed)
Pt discussed with Dr Tenny Craw.  I have serious concerns about a carotid intervention in this patient that has had multiple CHF exacerbations requiring admission and ER visits 3 times in the last 3 weeks.   This would put him in a very high risk category for cardiac failure perioperatively.  Dr Pearlean Brownie is concerned regarding delaying carotid intervention with risk of stroke.  As far as I am concerned he does not need to come off his Plavix for CEA.  This could be continued.  I believe his cardiac status currently is quite tenuous and I will defer to Cardiology whether this needs inpt or outpt management but it was also suggested by Dr Pearlean Brownie that possibly his cardiac status could be improved inpt and CEA possibly next week.  Dr Tenny Craw will have pts primary cardiologist review him.  Questions to be answered are whether or not his cardiac status can be improved from its current state with multiple recent episodes of failure and whether or not his life expectancy is greater than 2-5 years from his cardiac standpoint as he would need to live this long to actually achieve benefit from carotid intervention.  Fabienne Bruns, MD [facility] Office: [phone] Pager: [phone]

## 2012-09-29 NOTE — Evaluation (Signed)
Clinical/Bedside Swallow Evaluation Patient Details  Name: Trevor Simpson MRN: [id number] Date of Birth: 12-18-1933  Today's Date: 09/29/2012 Time: 0940-1010 SLP Time Calculation (min): 30 min  Past Medical History:  Past Medical History  Diagnosis Date  . Coronary artery disease   . Other and unspecified hyperlipidemia   . Hypertension   . Atrial fibrillation   . Sinoatrial node dysfunction   . PVD (peripheral vascular disease)     s/p renal artery stent 20-04  . Pacemaker st Jude   . AAA (abdominal aortic aneurysm)     repaired  . Systolic congestive heart failure   . Cardiomyopathy    Past Surgical History:  Past Surgical History  Procedure Laterality Date  . Inguinal hernia repair      left  . Cardiac pacemaker placement  08/01/2009    st jude dual chamber  . Colonoscopy    . Esophagogastroduodenoscopy    . Translumminal angioplasty    . Tee without cardioversion N/A 09/23/2012    Procedure: TRANSESOPHAGEAL ECHOCARDIOGRAM (TEE);  Surgeon: Lewayne Bunting, MD;  Location: [facility] ENDOSCOPY;  Service: Cardiovascular;  Laterality: N/A;  . Cardioversion N/A 09/23/2012    Procedure: CARDIOVERSION;  Surgeon: Lewayne Bunting, MD;  Location: [facility] ENDOSCOPY;  Service: Cardiovascular;  Laterality: N/A;   HPI:  [name] is a 79 y.o. male presenting with left hemisensory deficits. TPA was not given secondary to the fact that the patient was already on Xarelto.  Imaging confirms no acute infarct. The patient is unable to have an MRI secondary to a permanent pacemaker.  Infarct felt to be embolic secondary to atrial fibrillation.     Assessment / Plan / Recommendation Clinical Impression  Pt demonstrates swallow function WNL. No SLP f/u needed, pt may continue current diet. Sign off.     Aspiration Risk  Mild    Diet Recommendation NPO   Liquid Administration via: Cup;Straw Medication Administration: Whole meds with liquid Supervision: Patient able to self feed Postural  Changes and/or Swallow Maneuvers: Seated upright 90 degrees    Other  Recommendations Oral Care Recommendations: Patient independent with oral care   Follow Up Recommendations  None    Frequency and Duration        Pertinent Vitals/Pain NA    SLP Swallow Goals     Swallow Study Prior Functional Status       General HPI: [name] is a 79 y.o. male presenting with left hemisensory deficits. TPA was not given secondary to the fact that the patient was already on Xarelto.  Imaging confirms no acute infarct. The patient is unable to have an MRI secondary to a permanent pacemaker.  Infarct felt to be embolic secondary to atrial fibrillation.   Type of Study: Bedside swallow evaluation Diet Prior to this Study: Regular;Thin liquids Temperature Spikes Noted: No Respiratory Status: Room air History of Recent Intubation: No Behavior/Cognition: Alert;Cooperative;Pleasant mood Oral Cavity - Dentition: Adequate natural dentition Self-Feeding Abilities: Able to feed self Patient Positioning: Upright in chair Baseline Vocal Quality: Hoarse Volitional Cough: Strong Volitional Swallow: Able to elicit    Oral/Motor/Sensory Function Overall Oral Motor/Sensory Function: Appears within functional limits for tasks assessed   Ice Chips     Thin Liquid Thin Liquid: Within functional limits    Nectar Thick Nectar Thick Liquid: Not tested   Honey Thick Honey Thick Liquid: Not tested   Puree Puree: Within functional limits   Solid   GO    Solid: Within functional  limits      Harlon Ditty, [place] CCC-SLP [phone]  Claudine Mouton 09/29/2012,10:12 AM

## 2012-09-29 NOTE — Progress Notes (Signed)
Physical Therapy Treatment Patient Details Name: Trevor Simpson MRN: [id number] DOB: 25-Sep-1933 Today's Date: 09/29/2012 Time: 7829-5621 PT Time Calculation (min): 19 min  PT Assessment / Plan / Recommendation  PT Comments   Pt slowly improving with therapy. Cont to demo balance deficits with dynamic gt. DGI this session was 17. Pt requires rest breaks due to increased fatigue. Pt has been amb hallways with son and is highly motivated. PT will continue to follow acutely to maximize functional mobility.   Follow Up Recommendations  Home health PT;Supervision - Intermittent     Does the patient have the potential to tolerate intense rehabilitation     Barriers to Discharge        Equipment Recommendations  Rolling walker with 5" wheels    Recommendations for Other Services    Frequency Min 4X/week   Progress towards PT Goals Progress towards PT goals: Progressing toward goals  Plan Current plan remains appropriate    Precautions / Restrictions Precautions Precautions: Fall Restrictions Weight Bearing Restrictions: No   Pertinent Vitals/Pain No new complaints.     Mobility  Bed Mobility Bed Mobility: Not assessed Transfers Transfers: Stand to Sit;Sit to Stand Sit to Stand: 5: Supervision;From toilet;With armrests;With upper extremity assist Stand to Sit: 5: Supervision;To chair/3-in-1;With armrests;With upper extremity assist Details for Transfer Assistance: min cues for hand placement and safety; controlled descent to chair noted with min cues Ambulation/Gait Ambulation/Gait Assistance: 4: Min guard Ambulation Distance (Feet): 100 Feet (x2) Assistive device: Rolling walker Ambulation/Gait Assistance Details: required multiple rests due to fatigue this session; 1 standing rest break and 1 seated rest break; pt amb with increased stability with RW; performed DGI to assess fall risk; verbal cues for upright posture and instructional cues for speed, trajectory and RW safety   Gait Pattern: Step-through pattern;Decreased stride length;Scissoring;Narrow base of support;Trunk flexed Gait velocity: able to increase with cues and min guard  General Gait Details: severe thoracic kyphosis limiting ability to look up with gt; cont to require increased time to and number of steps to turn; pt stated he felt better with RW today than yesterday; reports to have been walking halls with son today. Pt able to pick objects off ground with min guard (A) for safety x 2 Stairs: Yes Stairs Assistance: 4: Min guard Stair Management Technique: No rails;Step to pattern;Forwards;Two rails Number of Stairs: 2 Wheelchair Mobility Wheelchair Mobility: No    Exercises     PT Diagnosis:    PT Problem List:   PT Treatment Interventions:     PT Goals (current goals can now be found in the care plan section) Acute Rehab PT Goals Patient Stated Goal: get stronger to get surgery on my neck  PT Goal Formulation: With patient Time For Goal Achievement: 10/12/12 Potential to Achieve Goals: Good  Visit Information  Last PT Received On: 09/29/12 Assistance Needed: +1 History of Present Illness: Adm due to new CVA causing Left hemisensory deficits. Imaging confimed no acute infarct. pt with CHF, afib, HTN, permeanent pacemaker    Subjective Data  Subjective: pt sitting in chair states "i just got back from a walk but I can do something else with you" Patient Stated Goal: get stronger to get surgery on my neck    Cognition  Cognition Arousal/Alertness: Awake/alert Behavior During Therapy: WFL for tasks assessed/performed Overall Cognitive Status: Within Functional Limits for tasks assessed    Balance  Balance Balance Assessed: Yes Dynamic Standing Balance Dynamic Standing - Balance Support: Bilateral upper extremity supported;During functional  activity Dynamic Standing - Level of Assistance: 4: Min assist;5: Stand by assistance Dynamic Gait Index Level Surface: Mild  Impairment Change in Gait Speed: Mild Impairment Gait with Horizontal Head Turns: Normal Gait with Vertical Head Turns: Mild Impairment Gait and Pivot Turn: Mild Impairment Step Over Obstacle: Mild Impairment Step Around Obstacles: Normal Steps: Moderate Impairment Total Score: 17  End of Session PT - End of Session Equipment Utilized During Treatment: Gait belt Activity Tolerance: Patient tolerated treatment well Patient left: in chair;with call bell/phone within reach;with family/visitor present Nurse Communication: Mobility status   GP     Donell Sievert, [place] [phone] 09/29/2012, 2:52 PM

## 2012-09-30 DIAGNOSIS — I6350 Cerebral infarction due to unspecified occlusion or stenosis of unspecified cerebral artery: Secondary | ICD-10-CM

## 2012-09-30 DIAGNOSIS — I5021 Acute systolic (congestive) heart failure: Secondary | ICD-10-CM

## 2012-09-30 NOTE — Progress Notes (Signed)
Pharmacist Heart Failure Core Measure Documentation  Assessment: Trevor Simpson has an EF documented as 25%  on 09/23/12 by Echo.  Rationale: Heart failure patients with left ventricular systolic dysfunction (LVSD) and an EF < 40% should be prescribed an angiotensin converting enzyme inhibitor (ACEI) or angiotensin receptor blocker (ARB) at discharge unless a contraindication is documented in the medical record.  This patient is not currently on an ACEI or ARB for HF.  This note is being placed in the record in order to provide documentation that a contraindication to the use of these agents is present for this encounter.  ACE Inhibitor or Angiotensin Receptor Blocker is contraindicated (specify all that apply)  []   ACEI allergy AND ARB allergy []   Angioedema []   Moderate or severe aortic stenosis []   Hyperkalemia [x]   Hypotension []   Renal artery stenosis [x]   Worsening renal function, preexisting renal disease or dysfunction   [name] 09/30/2012 12:34 PM

## 2012-09-30 NOTE — Consult Note (Signed)
Per Cardiology needs to wait 3-4 weeks prior to CEA.  I will see him in follow up as outpt.  Ok to continue Plavix and Xarelto from my standpoint  Fabienne Bruns, MD [facility] Office: [phone] Pager: [phone]

## 2012-09-30 NOTE — Discharge Summary (Signed)
Physician Discharge Summary  Trevor Simpson:[id number] DOB: [birth year] DOA: 09/27/2012  PCP: Pearson Grippe, MD  Admit date: 09/27/2012 Discharge date: 09/30/2012  Time spent: 35 minutes  Recommendations for Outpatient Follow-up:  1. Follow up with Dr. Darrick Penna in 2-4 weeks 2. Follow up with Dr. Pearlean Brownie as scheduled 3. Follow up with Dr. Ladona Ridgel as scheduled  Discharge Diagnoses:  Principal Problem:   CVA (cerebral infarction) Active Problems:   Other and unspecified hyperlipidemia   HYPERTENSION   CAD   ATRIAL FIBRILLATION, PAROXYSMAL   CAROTID ARTERY DISEASE   Discharge Condition: Stable  Diet recommendation: Heart Healthy  Filed Weights   09/27/12 1436 09/28/12 0500 09/29/12 0433  Weight: 89.8 kg (197 lb 15.6 oz) 88.6 kg (195 lb 5.2 oz) 90.2 kg (198 lb 13.7 oz)    History of present illness:  Trevor Simpson is a 79 y.o. male with a pmh significant for cad, chronic systolic chf, PAD, htn who presents to the ED with complaints of acute onset L facial numbness that started around 6am, which later progressed to L arm and leg numbness as well. The patient alerted his family, who proceeded to bring the pt to the ED. In the ED, the patient was found to have an unremarkable non-contrast head CT. Symptoms persisted, but are slowly improving. Of note, the patient is s/p TEE on 09/23/12 that demonstrated no evidence of thrombus. The hospitalist service was consulted for admission for CVA workup.   Hospital Course:  CVA:  - Suspected CVA  - Continued plavix and xarelto per Neuro recs  - Given hx of pacemaker, could not order MRI  - CTA neck with 82% ICA stenosis on R  - Vascular Surgery consulted with recs for CEA - Did not order 2d echo given TEE done recently  - Cardiology consulted for pre-op eval/recs - recs for surgery after 4 weeks from recent stent - Per vascular surgery, OK to d/c with close outpatient follow up and continued plavix and xarelto until seen in office 2-4 weeks  from now  HTN:  - Currently stable and controlled  Chronic systolic CHF  - [facility] consulted for pre-op risk assessment and to optimize meds  - Currently compensated  - Monitor I/O's  - Cont on diuretics A.fib:  - Rate controlled  - Paced  CAD:  - Asymptomatic  - Cont to monitor  DVT prophylaxis:  - On therapeutic anticoagulation  Hypokalemia:  - Replaced   Consultations: Neurology  Vascular surgery  [facility]   Discharge Exam: Filed Vitals:   09/29/12 1400 09/29/12 2203 09/30/12 0618 09/30/12 1000  BP: 118/65 124/61 117/63 119/62  Pulse: 89 59 59 61  Temp: 98 F (36.7 C) 97.5 F (36.4 C) 97.6 F (36.4 C) 97.2 F (36.2 C)  TempSrc: Oral Oral Oral Oral  Resp: 18 18 18 17   Height:      Weight:      SpO2: 100% 99% 95% 100%    General: Awake, in nad Cardiovascular: regular, s1, s2 Respiratory: normal resp effort, no wheezing  Discharge Instructions       Future Appointments Provider Department Dept Phone   10/14/2012 11:45 AM Trevor Macadamia, NP [facility] Main Office [place]) [phone]   10/20/2012 9:30 AM [facility] [employer] [employer] Main Office [place]) [phone]   11/11/2012 10:00 AM Trevor Maw, MD [facility] Main Office [place]) [phone]   11/13/2012 1:00 PM [facility] [phone]   11/13/2012 2:00  PM Trevor Kerns, MD [facility] [phone]       Medication List    STOP taking these medications       torsemide 20 MG tablet  Commonly known as:  DEMADEX      TAKE these medications       amiodarone 200 MG tablet  Commonly known as:  PACERONE  Take 1 tablet (200 mg total) by mouth daily.     atorvastatin 20 MG tablet  Commonly known as:  LIPITOR  Take 20 mg by mouth daily.     calcium carbonate 600 MG Tabs  Commonly known as:  OS-CAL  Take 600 mg by mouth 2 (two) times daily with a meal.     cetirizine  10 MG tablet  Commonly known as:  ZYRTEC  Take 10 mg by mouth daily.     Cholecalciferol 1000 UNITS capsule  Take 1,000 Units by mouth daily.     clopidogrel 75 MG tablet  Commonly known as:  PLAVIX  Take 1 tablet (75 mg total) by mouth daily with breakfast.     diphenhydramine-acetaminophen 25-500 MG Tabs  Commonly known as:  TYLENOL PM  Take 1-1.5 tablets by mouth at bedtime as needed (sleep).     fish oil-omega-3 fatty acids 1000 MG capsule  Take 1 g by mouth 2 (two) times daily.     furosemide 80 MG tablet  Commonly known as:  LASIX  Take 1 tablet (80 mg total) by mouth daily.     levothyroxine 112 MCG tablet  Commonly known as:  SYNTHROID, LEVOTHROID  Take 112 mcg by mouth daily.     magnesium gluconate 500 MG tablet  Commonly known as:  MAGONATE  Take 500 mg by mouth daily.     metoprolol 50 MG tablet  Commonly known as:  LOPRESSOR  Take 1 tablet (50 mg total) by mouth 2 (two) times daily.     multivitamin with minerals Tabs  Take 1 tablet by mouth daily.     nitroGLYCERIN 0.4 MG SL tablet  Commonly known as:  NITROSTAT  Place 1 tablet (0.4 mg total) under the tongue every 5 (five) minutes as needed for chest pain.     omeprazole 20 MG capsule  Commonly known as:  PRILOSEC  Take 20 mg by mouth daily as needed (indigestion).     Rivaroxaban 20 MG Tabs  Commonly known as:  XARELTO  Take 20 mg by mouth every evening.     SAW PALMETTO PO  Take 1 capsule by mouth 2 (two) times daily.     Super B Complex Tabs  Take 1 tablet by mouth daily.     vitamin C 500 MG tablet  Commonly known as:  ASCORBIC ACID  Take 500 mg by mouth daily.       Allergies  Allergen Reactions  . Codeine     "tripped out on it"  . Morphine     "tripped out on it"   Follow-up Information   Schedule an appointment as soon as possible for a visit with Trevor Rigg, MD. (Stroke clinic)    Contact information:   [address] [place]  [address] [phone]       Schedule an appointment as soon as possible for a visit with Trevor Kerns, MD. (2-4 weeks)    Contact information:   [address] [place] [address] [phone]       Schedule an appointment as soon as possible  for a visit with Lewayne Bunting, MD.   Contact information:   [address] [place] [address] [phone]       Schedule an appointment as soon as possible for a visit with Pearson Grippe, MD.   Contact information:   [address] [place] [address] [phone]        The results of significant diagnostics from this hospitalization (including imaging, microbiology, ancillary and laboratory) are listed below for reference.    Significant Diagnostic Studies: Ct Angio Head W/cm &/or Wo Cm  09/27/2012   *RADIOLOGY REPORT*  Clinical Data:  DIZZINESS.  ATRIAL FIBRILLATION.  HIGH BLOOD PRESSURE.  PACEMAKER IN PLACE.  CARDIOMYOPATHY.  NUMBNESS LEFT SIDE.  CT ANGIOGRAPHY HEAD AND NECK  Technique:  Multidetector CT imaging of the head and neck was performed using the standard protocol during bolus administration of intravenous contrast.  Multiplanar CT image reconstructions including MIPs were obtained to evaluate the vascular anatomy. Carotid stenosis measurements (when applicable) are obtained utilizing NASCET criteria, using the distal internal carotid diameter as the denominator.  Contrast: 50mL OMNIPAQUE IOHEXOL 350 MG/ML SOLN  Comparison:  09/27/2012 head CT.  No comparison angiogram.  CTA NECK  Findings:  Bilateral pleural effusions.  Irregular shaggy plaque of the visualized aspect of the thoracic aorta (most notable distal arch and descending thoracic aorta). Ectatic ascending thoracic aorta measures up to 3.7 cm.  Calcified plaque origin of the great vessels with mild narrowing innominate artery, mild to moderate narrowing proximal left common carotid artery and moderate narrowing origin of the left subclavian  artery.  Plaque with mild narrowing right common carotid artery.  Right carotid bifurcation calcified irregular plaque with 82% diameter stenosis of the proximal right internal carotid artery.  Mild to slightly moderate narrowing right internal carotid artery just proximal to the skull base.  Plaque of the left common carotid artery.  Left carotid bifurcation plaque with 54% diameter stenosis proximal left internal carotid artery.  Plaque with mild narrowing distal left internal carotid artery vertical cervical segment.  Plaque with mild narrowing and irregularity right subclavian artery.  Mild to slightly moderate narrowing proximal right vertebral artery.  Proximal aspect of the left vertebral artery is not visualized. Minimal flow is seen within the vertical segment possibly from collateral flow.  Fullness of the left posterior-superior nasopharynx may represent coaptation membranes although mucosa abnormality cannot be excluded.  Cervical spondylotic changes with various degrees of spinal stenosis and foraminal narrowing and   Review of the MIP images confirms the above findings.  IMPRESSION: Bilateral pleural effusions.  Irregular shaggy plaque of the visualized aspect of the thoracic aorta (most notable distal arch and descending thoracic aorta). Ectatic ascending thoracic aorta measures up to 3.7 cm.  Calcified plaque origin of the great vessels with mild narrowing innominate artery, mild to moderate narrowing proximal left common carotid artery and moderate narrowing origin of the left subclavian artery.  Right carotid bifurcation calcified irregular plaque with 82% diameter stenosis of the proximal right internal carotid artery.  Left carotid bifurcation plaque with 54% diameter stenosis proximal left internal carotid artery.  Plaque with mild narrowing and irregularity right subclavian artery.  Mild to slightly moderate narrowing proximal right vertebral artery.  Proximal aspect of the left vertebral  artery is not visualized. Minimal flow is seen within the vertical segment possibly from collateral flow.  Fullness of the left posterior-superior nasopharynx may represent coaptation membranes although mucosa abnormality cannot be excluded.  CTA HEAD  Findings:  No intracranial hemorrhage.  No intracranial enhancing lesion.  Small vessel disease type changes most notable anterior right external capsule region.  No CT evidence of large acute infarct. No hydrocephalus.  Right vertebral artery is dominant.  Moderate tandem stenosis of the vertebral arteries.  Mild narrowing of portions of the basilar artery.  Irregularity with narrowing of portions of the PICAs, AICAs, superior cerebral arteries and posterior cerebral arteries.  Petrous and cavernous segment internal carotid artery moderate narrowing and irregularity with calcified plaque.  Mild narrowing M1 segment of the middle cerebral artery more notable on the right.  Aplastic A1 segment right anterior cerebral artery.  Middle cerebral artery and anterior cerebral artery branch vessel irregularity bilaterally.  No aneurysm noted.   Review of the MIP images confirms the above findings.  IMPRESSION: No intracranial hemorrhage.  No intracranial enhancing lesion.  Small vessel disease type changes most notable anterior right external capsule region.  No CT evidence of large acute infarct.  Right vertebral artery is dominant.  Moderate tandem stenosis of the vertebral arteries.  Mild narrowing of portions of the basilar artery.  Irregularity with narrowing of portions of the PICAs, AICAs, superior cerebral arteries and posterior cerebral arteries.  Petrous and cavernous segment internal carotid artery moderate narrowing and irregularity with calcified plaque.  Mild narrowing M1 segment of the middle cerebral artery more notable on the right.  Aplastic A1 segment right anterior cerebral artery.  Middle cerebral artery and anterior cerebral artery branch vessel  irregularity bilaterally.   Original Report Authenticated By: Lacy Duverney, M.D.   Dg Chest 2 View  09/29/2012   *RADIOLOGY REPORT*  Clinical Data: Chest heart failure and shortness of breath.  CHEST - 2 VIEW  Comparison: Two-view chest 09/25/2012.  Findings: The heart is enlarged.  Aeration is improved at the lung bases bilaterally.  Bilateral pleural effusions have decreased. Minimal bibasilar atelectasis is evident.  IMPRESSION:  1.  Cardiomegaly with decreasing pleural effusions. 2.  Improved aeration at both lung bases.   Original Report Authenticated By: Marin Roberts, M.D.   Dg Chest 2 View  09/25/2012   *RADIOLOGY REPORT*  Clinical Data: Cough.  Recent stent and thoracentesis.  CHEST - 2 VIEW  Comparison: Radiographs 09/19/2012.  CT 09/12/2010.  Findings: Left subclavian pacemaker leads are unchanged within the right atrium and right ventricle.  Heart size and mediastinal contours are stable.  The pleural effusions have decreased in volume.  There is mild edema and bibasilar atelectasis.  No pneumothorax is evident.  IMPRESSION: Improved pleural effusions with residual mild edema and bibasilar atelectasis.  No new findings demonstrated.   Original Report Authenticated By: Carey Bullocks, M.D.   Ct Head Wo Contrast  09/28/2012   *RADIOLOGY REPORT*  Clinical Data: Stroke.  T-PA not given  CT HEAD WITHOUT CONTRAST  Technique:  Contiguous axial images were obtained from the base of the skull through the vertex without contrast.  Comparison: CT 09/27/2012  Findings: Generalized atrophy with mild chronic microvascular ischemic change in the white matter.  Negative for acute infarct. Negative for hemorrhage or mass lesion.  Calvarium is intact.  IMPRESSION: No acute abnormality and no change from yesterday.   Original Report Authenticated By: Janeece Riggers, M.D.   Ct Head Wo Contrast  09/27/2012   *RADIOLOGY REPORT*  Clinical Data: Code stroke.  Left sided numbness  CT HEAD WITHOUT CONTRAST   Technique:  Contiguous axial images were obtained from the base of the skull through the vertex without contrast.  Comparison: None.  Findings: Mild atrophy.  Negative for hydrocephalus.  No acute infarct.  Negative for hemorrhage or mass lesion.  No acute bony abnormality.  Mild mucosal edema in the maxillary sinuses.  IMPRESSION: No acute abnormality.   Original Report Authenticated By: Janeece Riggers, M.D.   Ct Angio Neck W/cm &/or Wo/cm  09/27/2012   *RADIOLOGY REPORT*  Clinical Data:  DIZZINESS.  ATRIAL FIBRILLATION.  HIGH BLOOD PRESSURE.  PACEMAKER IN PLACE.  CARDIOMYOPATHY.  NUMBNESS LEFT SIDE.  CT ANGIOGRAPHY HEAD AND NECK  Technique:  Multidetector CT imaging of the head and neck was performed using the standard protocol during bolus administration of intravenous contrast.  Multiplanar CT image reconstructions including MIPs were obtained to evaluate the vascular anatomy. Carotid stenosis measurements (when applicable) are obtained utilizing NASCET criteria, using the distal internal carotid diameter as the denominator.  Contrast: 50mL OMNIPAQUE IOHEXOL 350 MG/ML SOLN  Comparison:  09/27/2012 head CT.  No comparison angiogram.  CTA NECK  Findings:  Bilateral pleural effusions.  Irregular shaggy plaque of the visualized aspect of the thoracic aorta (most notable distal arch and descending thoracic aorta). Ectatic ascending thoracic aorta measures up to 3.7 cm.  Calcified plaque origin of the great vessels with mild narrowing innominate artery, mild to moderate narrowing proximal left common carotid artery and moderate narrowing origin of the left subclavian artery.  Plaque with mild narrowing right common carotid artery.  Right carotid bifurcation calcified irregular plaque with 82% diameter stenosis of the proximal right internal carotid artery.  Mild to slightly moderate narrowing right internal carotid artery just proximal to the skull base.  Plaque of the left common carotid artery.  Left carotid  bifurcation plaque with 54% diameter stenosis proximal left internal carotid artery.  Plaque with mild narrowing distal left internal carotid artery vertical cervical segment.  Plaque with mild narrowing and irregularity right subclavian artery.  Mild to slightly moderate narrowing proximal right vertebral artery.  Proximal aspect of the left vertebral artery is not visualized. Minimal flow is seen within the vertical segment possibly from collateral flow.  Fullness of the left posterior-superior nasopharynx may represent coaptation membranes although mucosa abnormality cannot be excluded.  Cervical spondylotic changes with various degrees of spinal stenosis and foraminal narrowing and   Review of the MIP images confirms the above findings.  IMPRESSION: Bilateral pleural effusions.  Irregular shaggy plaque of the visualized aspect of the thoracic aorta (most notable distal arch and descending thoracic aorta). Ectatic ascending thoracic aorta measures up to 3.7 cm.  Calcified plaque origin of the great vessels with mild narrowing innominate artery, mild to moderate narrowing proximal left common carotid artery and moderate narrowing origin of the left subclavian artery.  Right carotid bifurcation calcified irregular plaque with 82% diameter stenosis of the proximal right internal carotid artery.  Left carotid bifurcation plaque with 54% diameter stenosis proximal left internal carotid artery.  Plaque with mild narrowing and irregularity right subclavian artery.  Mild to slightly moderate narrowing proximal right vertebral artery.  Proximal aspect of the left vertebral artery is not visualized. Minimal flow is seen within the vertical segment possibly from collateral flow.  Fullness of the left posterior-superior nasopharynx may represent coaptation membranes although mucosa abnormality cannot be excluded.  CTA HEAD  Findings:  No intracranial hemorrhage.  No intracranial enhancing lesion.  Small vessel disease type  changes most notable anterior right external capsule region.  No CT evidence of large acute infarct. No hydrocephalus.  Right vertebral artery is dominant.  Moderate  tandem stenosis of the vertebral arteries.  Mild narrowing of portions of the basilar artery.  Irregularity with narrowing of portions of the PICAs, AICAs, superior cerebral arteries and posterior cerebral arteries.  Petrous and cavernous segment internal carotid artery moderate narrowing and irregularity with calcified plaque.  Mild narrowing M1 segment of the middle cerebral artery more notable on the right.  Aplastic A1 segment right anterior cerebral artery.  Middle cerebral artery and anterior cerebral artery branch vessel irregularity bilaterally.  No aneurysm noted.   Review of the MIP images confirms the above findings.  IMPRESSION: No intracranial hemorrhage.  No intracranial enhancing lesion.  Small vessel disease type changes most notable anterior right external capsule region.  No CT evidence of large acute infarct.  Right vertebral artery is dominant.  Moderate tandem stenosis of the vertebral arteries.  Mild narrowing of portions of the basilar artery.  Irregularity with narrowing of portions of the PICAs, AICAs, superior cerebral arteries and posterior cerebral arteries.  Petrous and cavernous segment internal carotid artery moderate narrowing and irregularity with calcified plaque.  Mild narrowing M1 segment of the middle cerebral artery more notable on the right.  Aplastic A1 segment right anterior cerebral artery.  Middle cerebral artery and anterior cerebral artery branch vessel irregularity bilaterally.   Original Report Authenticated By: Lacy Duverney, M.D.   Dg Chest Port 1 View  09/19/2012   *RADIOLOGY REPORT*  Clinical Data: Shortness of breath.  Chest pain.  Anxiety for 2 days.  PORTABLE CHEST - 1 VIEW  Comparison: 09/12/2012.  Findings: Shallow inspiration.  There is increasing infiltration or atelectasis in both lungs since  the previous study.  There is blunting of the left costophrenic angle suggesting a small effusion.  Cardiac enlargement without pulmonary vascular congestion.  No pneumothorax.  Calcified and tortuous aorta. Stable appearance of cardiac pacemaker.  IMPRESSION: Increasing infiltration or atelectasis in both lung bases with small developing left pleural effusion.   Original Report Authenticated By: Burman Nieves, M.D.   Dg Chest Port 1 View  09/12/2012   *RADIOLOGY REPORT*  Clinical Data: Chest pain.  Shortness of breath.  Former smoker with current history of COPD.  PORTABLE CHEST - 1 VIEW 09/12/2012 1949 hours:  Comparison: CT chest 09/12/2010, 12/13/2009.  Two-view chest x-ray 09/22/2009.  Findings: Suboptimal inspiration.  Cardiac silhouette enlarged but stable, allowing for differences in technique and degree of inspiration.  Linear scarring in the right lung base with superimposed mild atelectasis.  Lungs otherwise clear.  Pulmonary vascularity normal without evidence of pulmonary edema.  Left subclavian dual lead transvenous pacemaker unchanged.  Left costophrenic angle excluded from the image.  IMPRESSION: Suboptimal inspiration.  Scarring at the right lung base and associated mild right basilar atelectasis.  No acute cardiopulmonary disease otherwise.  Stable cardiomegaly.   Original Report Authenticated By: Hulan Saas, M.D.    Microbiology: No results found for this or any previous visit (from the past 240 hour(s)).   Labs: Basic Metabolic Panel:  Recent Labs Lab 09/25/12 1720 09/27/12 1223 09/27/12 1235 09/28/12 0650 09/29/12 0625  NA 132* 128* 134* 137 133*  K 3.5 2.7* 2.9* 2.9* 3.9  CL 90* 83* 90* 92* 93*  CO2 27 33*  --  33* 30  GLUCOSE 167* 144* 145* 127* 117*  BUN 38* 34* 31* 25* 29*  CREATININE 1.47* 1.32 1.60* 1.41* 1.43*  CALCIUM 9.8 9.9  --  10.0 9.6  MG  --   --   --   --  2.1  Liver Function Tests:  Recent Labs Lab 09/27/12 1223 09/28/12 0650  09/29/12 0625  AST 28 27 24   ALT 23 21 19   ALKPHOS 66 67 64  BILITOT 2.8* 2.4* 2.0*  PROT 7.4 7.3 7.2  ALBUMIN 3.9 3.7 3.5   No results found for this basename: LIPASE, AMYLASE,  in the last 168 hours No results found for this basename: AMMONIA,  in the last 168 hours CBC:  Recent Labs Lab 09/25/12 1720 09/27/12 1223 09/27/12 1235 09/28/12 0650  WBC 8.0 8.2  --  9.8  NEUTROABS  --  6.4  --   --   HGB 14.8 15.0 16.7 15.6  HCT 44.4 44.0 49.0 45.3  MCV 94.3 93.2  --  93.6  PLT 149* 152  --  158   Cardiac Enzymes:  Recent Labs Lab 09/27/12 1224  TROPONINI <0.30   BNP: BNP (last 3 results)  Recent Labs  09/19/12 2159 09/25/12 1720 09/29/12 1200  PROBNP 9116.0* 7086.0* 5428.0*   CBG:  Recent Labs Lab 09/27/12 1207 09/27/12 1236  GLUCAP 133* 162*       Signed:  Nonnie Pickney K  [facility] 09/30/2012, 12:44 PM

## 2012-09-30 NOTE — Progress Notes (Signed)
[facility] PROGRESS NOTE  Trevor Simpson ZOX:[id number] DOB: Jan 19, 1934 DOA: 09/27/2012 PCP: Trevor Grippe, MD  Assessment/Plan: CVA:  - Suspected CVA  - Continue plavix and xarelto per Neuro recs  - Given hx of pacemaker, cannot order MRI  - CTA neck with 82% ICA stenosis on R  - Vascular Surgery consulted  - Did not order 2d echo given TEE done recently  - PT/OT/SLP  - Possible CEA? Cardiology for pre-op eval/recs - Await decision for ?surgery and timing of surgery from Neurology and Vascular surg HTN:  - Currently stable and controlled  Chronic systolic CHF  - [facility] consulted for pre-op risk assessment and to optimize meds  - Currently compensated  - Monitor I/O's  - Cont diuretics for now.  - Defer cardiac mgt and w/u to Cardiology A.fib:  - Rate controlled  - Paced  CAD:  - Asymptomatic  - Cont to monitor  DVT prophylaxis:  - On therapeutic anticoagulation  Hypokalemia:  - Replaced  - Cont to follow and replace as needed  Code Status: Full Family Communication: Pt in room (indicate person spoken with, relationship, and if by phone, the number) Disposition Plan: Pending  Consultants: Neurology  Vascular surgery [facility]  HPI/Subjective: No complaints. Eager to go home.  Objective: Filed Vitals:   09/29/12 1000 09/29/12 1400 09/29/12 2203 09/30/12 0618  BP: 122/79 118/65 124/61 117/63  Pulse: 84 89 59 59  Temp: 97.3 F (36.3 C) 98 F (36.7 C) 97.5 F (36.4 C) 97.6 F (36.4 C)  TempSrc: Oral Oral Oral Oral  Resp: 17 18 18 18   Height:      Weight:      SpO2: 100% 100% 99% 95%    Intake/Output Summary (Last 24 hours) at 09/30/12 0815 Last data filed at 09/30/12 0500  Gross per 24 hour  Intake    680 ml  Output    651 ml  Net     29 ml   Filed Weights   09/27/12 1436 09/28/12 0500 09/29/12 0433  Weight: 89.8 kg (197 lb 15.6 oz) 88.6 kg (195 lb 5.2 oz) 90.2 kg (198 lb 13.7 oz)    Exam:   General:  Awake, in  nad  Cardiovascular: regular, s1, s2  Respiratory: normal resp effort, no wheezing  Abdomen: soft, nondistended  Musculoskeletal: perfused, no clubbing   Data Reviewed: Basic Metabolic Panel:  Recent Labs Lab 09/25/12 1720 09/27/12 1223 09/27/12 1235 09/28/12 0650 09/29/12 0625  NA 132* 128* 134* 137 133*  Simpson 3.5 2.7* 2.9* 2.9* 3.9  CL 90* 83* 90* 92* 93*  CO2 27 33*  --  33* 30  GLUCOSE 167* 144* 145* 127* 117*  BUN 38* 34* 31* 25* 29*  CREATININE 1.47* 1.32 1.60* 1.41* 1.43*  CALCIUM 9.8 9.9  --  10.0 9.6  MG  --   --   --   --  2.1   Liver Function Tests:  Recent Labs Lab 09/27/12 1223 09/28/12 0650 09/29/12 0625  AST 28 27 24   ALT 23 21 19   ALKPHOS 66 67 64  BILITOT 2.8* 2.4* 2.0*  PROT 7.4 7.3 7.2  ALBUMIN 3.9 3.7 3.5   No results found for this basename: LIPASE, AMYLASE,  in the last 168 hours No results found for this basename: AMMONIA,  in the last 168 hours CBC:  Recent Labs Lab 09/25/12 1720 09/27/12 1223 09/27/12 1235 09/28/12 0650  WBC 8.0 8.2  --  9.8  NEUTROABS  --  6.4  --   --  HGB 14.8 15.0 16.7 15.6  HCT 44.4 44.0 49.0 45.3  MCV 94.3 93.2  --  93.6  PLT 149* 152  --  158   Cardiac Enzymes:  Recent Labs Lab 09/27/12 1224  TROPONINI <0.30   BNP (last 3 results)  Recent Labs  09/19/12 2159 09/25/12 1720 09/29/12 1200  PROBNP 9116.0* 7086.0* 5428.0*   CBG:  Recent Labs Lab 09/27/12 1207 09/27/12 1236  GLUCAP 133* 162*    No results found for this or any previous visit (from the past 240 hour(s)).   Studies: Dg Chest 2 View  09/29/2012   *RADIOLOGY REPORT*  Clinical Data: Chest heart failure and shortness of breath.  CHEST - 2 VIEW  Comparison: Two-view chest 09/25/2012.  Findings: The heart is enlarged.  Aeration is improved at the lung bases bilaterally.  Bilateral pleural effusions have decreased. Minimal bibasilar atelectasis is evident.  IMPRESSION:  1.  Cardiomegaly with decreasing pleural effusions. 2.   Improved aeration at both lung bases.   Original Report Authenticated By: Trevor Simpson, M.D.   Ct Head Wo Contrast  09/28/2012   *RADIOLOGY REPORT*  Clinical Data: Stroke.  T-PA not given  CT HEAD WITHOUT CONTRAST  Technique:  Contiguous axial images were obtained from the base of the skull through the vertex without contrast.  Comparison: CT 09/27/2012  Findings: Generalized atrophy with mild chronic microvascular ischemic change in the white matter.  Negative for acute infarct. Negative for hemorrhage or mass lesion.  Calvarium is intact.  IMPRESSION: No acute abnormality and no change from yesterday.   Original Report Authenticated By: Trevor Simpson, M.D.    Scheduled Meds: . amiodarone  200 mg Oral Daily  . atorvastatin  20 mg Oral Daily  . calcium carbonate  1 tablet Oral BID WC  . clopidogrel  75 mg Oral Q breakfast  . docusate sodium  100 mg Oral BID  . enoxaparin (LOVENOX) injection  40 mg Subcutaneous Q24H  . furosemide  80 mg Oral Daily  . levothyroxine  112 mcg Oral QAC breakfast  . loratadine  10 mg Oral Daily  . magnesium gluconate  500 mg Oral Daily  . metoprolol  50 mg Oral BID  . multivitamin with minerals  1 tablet Oral Daily  . pantoprazole  40 mg Oral Daily  . potassium chloride  40 mEq Oral BID  . senna  1 tablet Oral BID  . sodium chloride  3 mL Intravenous Q12H   Continuous Infusions:   Principal Problem:   CVA (cerebral infarction) Active Problems:   Other and unspecified hyperlipidemia   HYPERTENSION   CAD   ATRIAL FIBRILLATION, PAROXYSMAL   CAROTID ARTERY DISEASE    Time spent:    Trevor Simpson  [facility] Pager [phone]. If 7PM-7AM, please contact night-coverage at [email or web address], password [facility] 09/30/2012, 8:15 AM  LOS: 3 days

## 2012-09-30 NOTE — Progress Notes (Signed)
Stroke Team Progress Note  HISTORY Trevor Simpson is a 79 y.o. male with a history of CHF, A. fib who takes xarelto as well as Plavix. On 09/27/12 he woke up around 6 AM and felt okay. Then went back to sleep around 7 AM got up and found that he was unable to walk. He also noticed that he was numb on the left side. He was not a TPA candidate secondary to taking xarelto routinely.    SUBJECTIVE Family at bedside - wife and daughter. They think pt had his cardiac procedure 2 weeks ago per family, but they are not sure.  OBJECTIVE Most recent Vital Signs: Filed Vitals:   09/29/12 1000 09/29/12 1400 09/29/12 2203 09/30/12 0618  BP: 122/79 118/65 124/61 117/63  Pulse: 84 89 59 59  Temp: 97.3 F (36.3 C) 98 F (36.7 C) 97.5 F (36.4 C) 97.6 F (36.4 C)  TempSrc: Oral Oral Oral Oral  Resp: 17 18 18 18   Height:      Weight:      SpO2: 100% 100% 99% 95%   CBG (last 3)   Recent Labs  09/27/12 1207 09/27/12 1236  GLUCAP 133* 162*    IV Fluid Intake:     MEDICATIONS  . amiodarone  200 mg Oral Daily  . atorvastatin  20 mg Oral Daily  . calcium carbonate  1 tablet Oral BID WC  . clopidogrel  75 mg Oral Q breakfast  . docusate sodium  100 mg Oral BID  . enoxaparin (LOVENOX) injection  40 mg Subcutaneous Q24H  . furosemide  80 mg Oral Daily  . levothyroxine  112 mcg Oral QAC breakfast  . loratadine  10 mg Oral Daily  . magnesium gluconate  500 mg Oral Daily  . metoprolol  50 mg Oral BID  . multivitamin with minerals  1 tablet Oral Daily  . pantoprazole  40 mg Oral Daily  . potassium chloride  40 mEq Oral BID  . senna  1 tablet Oral BID  . sodium chloride  3 mL Intravenous Q12H   PRN:  acetaminophen, acetaminophen, nitroGLYCERIN, ondansetron (ZOFRAN) IV, ondansetron, polyethylene glycol  Diet:  Cardiac thin liquids Activity: Up with assistance DVT Prophylaxis:  Lovenox  CLINICALLY SIGNIFICANT STUDIES Basic Metabolic Panel:   Recent Labs Lab 09/28/12 0650 09/29/12 0625   NA 137 133*  K 2.9* 3.9  CL 92* 93*  CO2 33* 30  GLUCOSE 127* 117*  BUN 25* 29*  CREATININE 1.41* 1.43*  CALCIUM 10.0 9.6  MG  --  2.1   Liver Function Tests:   Recent Labs Lab 09/28/12 0650 09/29/12 0625  AST 27 24  ALT 21 19  ALKPHOS 67 64  BILITOT 2.4* 2.0*  PROT 7.3 7.2  ALBUMIN 3.7 3.5   CBC:   Recent Labs Lab 09/27/12 1223 09/27/12 1235 09/28/12 0650  WBC 8.2  --  9.8  NEUTROABS 6.4  --   --   HGB 15.0 16.7 15.6  HCT 44.0 49.0 45.3  MCV 93.2  --  93.6  PLT 152  --  158   Coagulation:   Recent Labs Lab 09/27/12 1223  LABPROT 27.5*  INR 2.67*   Cardiac Enzymes:   Recent Labs Lab 09/27/12 1224  TROPONINI <0.30   Urinalysis:   Recent Labs Lab 09/27/12 1701  COLORURINE YELLOW  LABSPEC 1.013  PHURINE 7.5  GLUCOSEU NEGATIVE  HGBUR NEGATIVE  BILIRUBINUR NEGATIVE  KETONESUR NEGATIVE  PROTEINUR NEGATIVE  UROBILINOGEN 0.2  NITRITE NEGATIVE  LEUKOCYTESUR  NEGATIVE   Lipid Panel No results found for this basename: chol,  trig,  hdl,  cholhdl,  vldl,  ldlcalc   HgbA1C  No results found for this basename: HGBA1C    Urine Drug Screen:     Component Value Date/Time   LABOPIA NONE DETECTED 09/27/2012 1701   COCAINSCRNUR NONE DETECTED 09/27/2012 1701   LABBENZ NONE DETECTED 09/27/2012 1701   AMPHETMU NONE DETECTED 09/27/2012 1701   THCU NONE DETECTED 09/27/2012 1701   LABBARB NONE DETECTED 09/27/2012 1701    Alcohol Level:   Recent Labs Lab 09/27/12 1223  ETH <11     CTA NECK 09/27/2012  Bilateral pleural effusions.  Irregular shaggy plaque of the visualized aspect of the thoracic aorta (most notable distal arch and descending thoracic aorta). Ectatic ascending thoracic aorta measures up to 3.7 cm.  Calcified plaque origin of the great vessels with mild narrowing innominate artery, mild to moderate narrowing proximal left common carotid artery and moderate narrowing origin of the left subclavian artery.  Right carotid bifurcation calcified  irregular plaque with 82% diameter stenosis of the proximal right internal carotid artery.  Left carotid bifurcation plaque with 54% diameter stenosis proximal left internal carotid artery.  Plaque with mild narrowing and irregularity right subclavian artery.  Mild to slightly moderate narrowing proximal right vertebral artery.  Proximal aspect of the left vertebral artery is not visualized. Minimal flow is seen within the vertical segment possibly from collateral flow.  Fullness of the left posterior-superior nasopharynx may represent coaptation membranes although mucosa abnormality cannot be excluded.    CTA HEAD  09/27/2012  No intracranial hemorrhage.  No intracranial enhancing lesion.  Small vessel disease type changes most notable anterior right external capsule region.  No CT evidence of large acute infarct.  Right vertebral artery is dominant.  Moderate tandem stenosis of the vertebral arteries.  Mild narrowing of portions of the basilar artery.  Irregularity with narrowing of portions of the PICAs, AICAs, superior cerebral arteries and posterior cerebral arteries.  Petrous and cavernous segment internal carotid artery moderate narrowing and irregularity with calcified plaque.  Mild narrowing M1 segment of the middle cerebral artery more notable on the right.  Aplastic A1 segment right anterior cerebral artery.  Middle cerebral artery and anterior cerebral artery branch vessel irregularity bilaterally.   Ct Head Wo Contrast 09/27/2012 No acute abnormality.     MRI of the brain  the patient has a pacemaker  MRA of the brain  the patient has a pacemaker  2D Echocardiogram  09/14/2012 EF 25-3and0% with no source  Compared to prior study April 2011 there has been reduction in LV systolic function, now LVEF 25-30% with apparent inferior WMA, and septal dyssynchrony. Moderate diastolic dysfunction. Signfiicant left atrial enlargement. Mitral regurgitation has progressed to moderate range. Pacer wire noted  in right heart. Moderate RV enlargement.Marland Kitchen PASP 27 mmHg with increased CVP.   TEE 09/23/12 - ejection fraction 20-25%. No cardiac source of emboli was identified.  Carotid Doppler   see CTA  of the neck   CXR  Improved pleural effusions with residual mild edema and bibasilar atelectasis. No new findings demonstrated.   EKG  - atrial fibrillation ventricular response 82 beats per minute.  Therapy Recommendations home health PT, no OT needs  Physical Exam   General - the pleasant 79 year old male in no acute distress Heart - irregularly irregular - no murmer noted Lungs - Clear to auscultation but decreased Skin - Warm and dry  NEUROLOGIC:  MENTAL STATUS:  awake, alert, oriented, language fluent,  follows simple commands.  CRANIAL NERVES: pupils equal and reactive to light,extraocular muscles intact, facial sensation and strength symmetric, uvula midlinec, tongue midline MOTOR: normal bulk and tone, Strength - 5 over 5 throughout SENSORY: normal and symmetric to light touch  COORDINATION: finger-nose-finger normal    ASSESSMENT [name] is a 79 y.o. male presenting with left hemisensory deficits. TPA was not given secondary to the fact that the patient was already on Xarelto.  Imaging confirms no acute infarct. The patient is unable to have an MRI secondary to a permanent pacemaker.  Infarct felt to be embolic secondary to atrial fibrillation and/or right ICA stenosis.  Dx: right brain stroke. On clopidogrel 75 mg orally every day and Xarelto prior to admission. Now on clopidogrel 75 mg orally every day and xarelto for secondary stroke prevention. Patient with resolution of deficits.   82% right internal carotid artery stenosis / plaque.  Left ventricular dysfunction with ejection fraction 20-25%  Atrial fibrillation with permanent pacemaker  Chronic congestive heart failure, controlled per cardiology  coronary artery disease. - had PCA/bare metal stent 2 wks ago, on  plavix  Hypertension  Hyperlipidemia  Renal insufficiency with hypokalemia  Hospital day # 3  TREATMENT/PLANthis is a  Continue xarelto for secondary stroke prevention from afib as well as continue clopidogrel 75 mg orally every day for secondary stroke prevention given recent PCI/bare metal stent. Ok for both from VVS standpoint.  Right carotid endarterectomy in ~2 weeks as per VVS - waiting 4 wks post PCI at cardiology recommendation.  Ok for discharge from neuro perspective  No further neurologic workup indicated  Followup with Dr. Pearlean Brownie in 2 months  Dr. Pearlean Brownie discussed diagnosis, prognosis,  treatment options and plan of care with  Patient and wife, daughter.  Annie Main, MSN, RN, ANVP-BC, ANP-BC, [facility] Pager: [phone] 09/30/2012 10:54 AM  I have personally obtained a history, examined the patient, evaluated imaging results, and formulated the assessment and plan of care. I agree with the above.  Delia Heady, MD

## 2012-09-30 NOTE — Progress Notes (Signed)
Physical Therapy Treatment Patient Details Name: Trevor Simpson MRN: [id number] DOB: February 22, 1934 Today's Date: 09/30/2012 Time: 1308-6578 PT Time Calculation (min): 23 min  PT Assessment / Plan / Recommendation  PT Comments   Pt safe to return home with wife's assist as needed.  Discussed appropriate exercise guidelines to improve conditioning during the time until return for CEA/   Follow Up Recommendations  Home health PT;Supervision - Intermittent     Does the patient have the potential to tolerate intense rehabilitation     Barriers to Discharge        Equipment Recommendations       Recommendations for Other Services    Frequency     Progress towards PT Goals Progress towards PT goals: Progressing toward goals  Plan Current plan remains appropriate    Precautions / Restrictions Precautions Precautions: Fall Restrictions Weight Bearing Restrictions: No   Pertinent Vitals/Pain     Mobility  Bed Mobility Bed Mobility: Not assessed Transfers Transfers: Not assessed Ambulation/Gait Ambulation/Gait Assistance: 4: Min guard Ambulation Distance (Feet): 600 Feet (with 3 standing rests) Assistive device: None Ambulation/Gait Assistance Details: generally steady with short episodes of mild unsteadiness that pt can self recover from. Gait Pattern: Step-through pattern Gait velocity: able to increase for short bursts Modified Rankin (Stroke Patients Only) Pre-Morbid Rankin Score: Slight disability Modified Rankin: Moderate disability    Exercises     PT Diagnosis:    PT Problem List:   PT Treatment Interventions:     PT Goals (current goals can now be found in the care plan section) Acute Rehab PT Goals Time For Goal Achievement: 10/12/12 Potential to Achieve Goals: Good  Visit Information  Last PT Received On: 09/30/12 Assistance Needed: +1 History of Present Illness: Adm due to new CVA causing Left hemisensory deficits. Imaging confimed no acute infarct. pt  with CHF, afib, HTN, permeanent pacemaker    Subjective Data  Subjective: Let's go while we're waiting   Cognition  Cognition Arousal/Alertness: Awake/alert Behavior During Therapy: WFL for tasks assessed/performed Overall Cognitive Status: Within Functional Limits for tasks assessed    Balance  Dynamic Gait Index Level Surface: Mild Impairment Change in Gait Speed: Mild Impairment Gait with Horizontal Head Turns: Normal Gait with Vertical Head Turns: Normal Step Over Obstacle: Mild Impairment Step Around Obstacles: Normal  End of Session PT - End of Session Activity Tolerance: Patient tolerated treatment well Patient left: Other (comment) (left pt standing talking to his family) Nurse Communication: Mobility status   GP     Trevor Simpson, [name] 09/30/2012, 3:14 PM 09/30/2012  [name], PT [phone]  (pager)

## 2012-09-30 NOTE — Progress Notes (Signed)
Patient ID: Trevor Simpson, male   DOB: November 08, 1933, 80 y.o.   MRN: [id number] Subjective:  Patient well known to me with multiple ER visits for CHF and atrial fib. He has been on both plavix and Xarelto and sustained a stroke resulting in mild residual motor aphasia. He denies chest pain or sob.  Objective:  Vital Signs in the last 24 hours: Temp:  [97.3 F (36.3 C)-98 F (36.7 C)] 97.6 F (36.4 C) (07/15 0618) Pulse Rate:  [59-89] 59 (07/15 0618) Resp:  [17-18] 18 (07/15 0618) BP: (117-124)/(61-79) 117/63 mmHg (07/15 0618) SpO2:  [95 %-100 %] 95 % (07/15 0618)  Intake/Output from previous day: 07/14 0701 - 07/15 0700 In: 680 [P.O.:680] Out: 651 [Urine:650; Stool:1] Intake/Output from this shift:    Physical Exam: Well appearing NAD HEENT: Unremarkable Neck:  No JVD, no thyromegally Lungs:  Clear with no wheezes or rales HEART:  Regular rate rhythm, no murmurs, no rubs, no clicks Abd:  Flat, positive bowel sounds, no organomegally, no rebound, no guarding Ext:  2 plus pulses, no edema, no cyanosis, no clubbing Skin:  No rashes no nodules Neuro:  CN II through XII intact, motor grossly intact  Lab Results:  Recent Labs  09/27/12 1223 09/27/12 1235 09/28/12 0650  WBC 8.2  --  9.8  HGB 15.0 16.7 15.6  PLT 152  --  158    Recent Labs  09/28/12 0650 09/29/12 0625  NA 137 133*  K 2.9* 3.9  CL 92* 93*  CO2 33* 30  GLUCOSE 127* 117*  BUN 25* 29*  CREATININE 1.41* 1.43*    Recent Labs  09/27/12 1224  TROPONINI <0.30   Hepatic Function Panel  Recent Labs  09/29/12 0625  PROT 7.2  ALBUMIN 3.5  AST 24  ALT 19  ALKPHOS 64  BILITOT 2.0*   No results found for this basename: CHOL,  in the last 72 hours No results found for this basename: PROTIME,  in the last 72 hours  Imaging: Dg Chest 2 View  09/29/2012   *RADIOLOGY REPORT*  Clinical Data: Chest heart failure and shortness of breath.  CHEST - 2 VIEW  Comparison: Two-view chest 09/25/2012.  Findings:  The heart is enlarged.  Aeration is improved at the lung bases bilaterally.  Bilateral pleural effusions have decreased. Minimal bibasilar atelectasis is evident.  IMPRESSION:  1.  Cardiomegaly with decreasing pleural effusions. 2.  Improved aeration at both lung bases.   Original Report Authenticated By: Marin Roberts, M.D.   Ct Head Wo Contrast  09/28/2012   *RADIOLOGY REPORT*  Clinical Data: Stroke.  T-PA not given  CT HEAD WITHOUT CONTRAST  Technique:  Contiguous axial images were obtained from the base of the skull through the vertex without contrast.  Comparison: CT 09/27/2012  Findings: Generalized atrophy with mild chronic microvascular ischemic change in the white matter.  Negative for acute infarct. Negative for hemorrhage or mass lesion.  Calvarium is intact.  IMPRESSION: No acute abnormality and no change from yesterday.   Original Report Authenticated By: Janeece Riggers, M.D.    Cardiac Studies: Tele - nsr with atrial pacing Assessment/Plan:  1. Stroke 2. PAF on Xarelto and Amio 3. CAD,s/p recent PCI on Plavix 4. New onset LV dysfunction with class 3 CHF and LBBB Rec: At this point the patient is stable. Unclear to me whether his neuro event represents a failure of Xarelto (embolic in setting of PAF) or thrombotic due to carotid disease. He is a reasonable surgical candidate and  CHF can be treated medically. I will defer decision regarding surgery to his neurologist. Would wait until one month out from prior PCI/bare metal stenting before considering surgery. His CHF appears well controlled. From cardiac perspective he can be discharged. Does he need PT/OT/speech therapy?  LOS: 3 days    Darren Nodal,M.D. 09/30/2012, 8:33 AM

## 2012-10-02 ENCOUNTER — Encounter: Payer: Self-pay | Admitting: Internal Medicine

## 2012-10-14 ENCOUNTER — Ambulatory Visit (INDEPENDENT_AMBULATORY_CARE_PROVIDER_SITE_OTHER): Payer: Medicare Other | Admitting: Nurse Practitioner

## 2012-10-14 ENCOUNTER — Encounter: Payer: Self-pay | Admitting: Nurse Practitioner

## 2012-10-14 VITALS — BP 120/64 | HR 60 | Ht 72.0 in | Wt 197.0 lb

## 2012-10-14 DIAGNOSIS — R0600 Dyspnea, unspecified: Secondary | ICD-10-CM

## 2012-10-14 DIAGNOSIS — R0609 Other forms of dyspnea: Secondary | ICD-10-CM

## 2012-10-14 DIAGNOSIS — I2589 Other forms of chronic ischemic heart disease: Secondary | ICD-10-CM

## 2012-10-14 DIAGNOSIS — I639 Cerebral infarction, unspecified: Secondary | ICD-10-CM

## 2012-10-14 DIAGNOSIS — I6350 Cerebral infarction due to unspecified occlusion or stenosis of unspecified cerebral artery: Secondary | ICD-10-CM

## 2012-10-14 DIAGNOSIS — I255 Ischemic cardiomyopathy: Secondary | ICD-10-CM

## 2012-10-14 LAB — CBC WITH DIFFERENTIAL/PLATELET
Basophils Absolute: 0.1 10*3/uL (ref 0.0–0.1)
Basophils Relative: 0.8 % (ref 0.0–3.0)
Eosinophils Absolute: 0.2 10*3/uL (ref 0.0–0.7)
Eosinophils Relative: 3.2 % (ref 0.0–5.0)
HCT: 46.9 % (ref 39.0–52.0)
Hemoglobin: 15.3 g/dL (ref 13.0–17.0)
Lymphocytes Relative: 15.9 % (ref 12.0–46.0)
Lymphs Abs: 1.1 10*3/uL (ref 0.7–4.0)
MCHC: 32.6 g/dL (ref 30.0–36.0)
MCV: 96.5 fl (ref 78.0–100.0)
Monocytes Absolute: 0.7 10*3/uL (ref 0.1–1.0)
Monocytes Relative: 9.9 % (ref 3.0–12.0)
Neutro Abs: 4.7 10*3/uL (ref 1.4–7.7)
Neutrophils Relative %: 70.2 % (ref 43.0–77.0)
Platelets: 158 10*3/uL (ref 150.0–400.0)
RBC: 4.86 Mil/uL (ref 4.22–5.81)
RDW: 15 % — ABNORMAL HIGH (ref 11.5–14.6)
WBC: 6.7 10*3/uL (ref 4.5–10.5)

## 2012-10-14 LAB — BASIC METABOLIC PANEL
BUN: 16 mg/dL (ref 6–23)
CO2: 29 mEq/L (ref 19–32)
Calcium: 9 mg/dL (ref 8.4–10.5)
Chloride: 99 mEq/L (ref 96–112)
Creatinine, Ser: 1.4 mg/dL (ref 0.4–1.5)
GFR: 52.62 mL/min — ABNORMAL LOW (ref >60.00)
Glucose, Bld: 101 mg/dL — ABNORMAL HIGH (ref 70–99)
Potassium: 4.2 mEq/L (ref 3.5–5.1)
Sodium: 135 mEq/L (ref 135–145)

## 2012-10-14 LAB — BRAIN NATRIURETIC PEPTIDE: Pro B Natriuretic peptide (BNP): 1146 pg/mL — ABNORMAL HIGH (ref 0.0–100.0)

## 2012-10-14 MED ORDER — AMIODARONE HCL 200 MG PO TABS: 200.0000 mg | ORAL_TABLET | Freq: Every day | ORAL | Status: DC

## 2012-10-14 MED ORDER — ASPIRIN EC 81 MG PO TBEC: 81.0000 mg | DELAYED_RELEASE_TABLET | Freq: Every day | ORAL | Status: DC

## 2012-10-14 NOTE — Progress Notes (Signed)
Trevor Simpson Date of Birth: 11-Jul-1933 Medical Record #[id number]  History of Present Illness: Trevor Simpson is seen back today for a post hospital visit. Seen for Dr. Ladona Ridgel. Has multiple medical issues which includes CAD, chronic systolic CHF, LBBB, PAD, HTN and recent stroke. He does have a pacemaker in place which has contraindicated MRI scanning.  He was hospitalized numerous times over the past 4 to 6 weeks. This has included an admission for heart failure and a cath with subsequent BMS - had Plavix added for a month.  Then presented with atrial fib - has been cardioverted. Then presented with a stroke. Work up noted an 82% ICA stenosis on the right. Will be having surgery 4 weeks post discharge due to the recent coronary stent placed. He is on Plavix and Xarelto. Does have LV dysfunction. EF down to 25%. He was put on increased doses of amiodarone and despite the most recent discharge summary saying 200 mg per day, he has been taking 400 mg per day. He has had his device checked post cardioversion and continues to go in and out of atrial fib. No bleeding reported.   Comes in today. Here with daughter and wife. He says he is doing better. No real deficit from the stroke. Almost finished with the Plavix. Not really short of breath unless he over exerts - which he tries to not do. No swelling. Weight has been stable and has actually been going down. Has been back to the [facility] with ? Panic attack. Wife thinks he is anxious. He denies this. She says he does have to sit up in the recliner at night sometimes.   Current Outpatient Prescriptions  Medication Sig Dispense Refill  . ALPRAZolam (XANAX) 0.25 MG tablet Take 0.25 mg by mouth at bedtime as needed.       Marland Kitchen amiodarone (PACERONE) 200 MG tablet Take 400 mg by mouth daily.      Marland Kitchen atorvastatin (LIPITOR) 20 MG tablet Take 20 mg by mouth daily.        . B Complex-C (SUPER B COMPLEX) TABS Take 1 tablet by mouth daily.       . calcium  carbonate (OS-CAL) 600 MG TABS Take 600 mg by mouth 2 (two) times daily with a meal.        . cetirizine (ZYRTEC) 10 MG tablet Take 10 mg by mouth daily.       . Cholecalciferol 1000 UNITS capsule Take 1,000 Units by mouth daily.        . clopidogrel (PLAVIX) 75 MG tablet Take 1 tablet (75 mg total) by mouth daily with breakfast.  30 tablet  0  . fish oil-omega-3 fatty acids 1000 MG capsule Take 1 g by mouth 2 (two) times daily.       . furosemide (LASIX) 80 MG tablet Take 1 tablet (80 mg total) by mouth daily.  90 tablet  0  . levothyroxine (SYNTHROID, LEVOTHROID) 112 MCG tablet Take 112 mcg by mouth daily.      . magnesium gluconate (MAGONATE) 500 MG tablet Take 500 mg by mouth daily.       . metoprolol (LOPRESSOR) 50 MG tablet Take 1 tablet (50 mg total) by mouth 2 (two) times daily.  60 tablet  11  . Multiple Vitamin (MULTIVITAMIN WITH MINERALS) TABS Take 1 tablet by mouth daily.      . nitroGLYCERIN (NITROSTAT) 0.4 MG SL tablet Place 1 tablet (0.4 mg total) under the tongue every 5 (  five) minutes as needed for chest pain.  25 tablet  3  . omeprazole (PRILOSEC) 20 MG capsule Take 20 mg by mouth daily as needed (indigestion).       . Rivaroxaban (XARELTO) 20 MG TABS Take 20 mg by mouth every evening.      . Saw Palmetto, Serenoa repens, (SAW PALMETTO PO) Take 1 capsule by mouth 2 (two) times daily.      . vitamin C (ASCORBIC ACID) 500 MG tablet Take 500 mg by mouth daily.        . [DISCONTINUED] diltiazem (CARDIZEM CD) 120 MG 24 hr capsule       . [DISCONTINUED] warfarin (COUMADIN) 4 MG tablet Take 4 mg by mouth daily.         No current facility-administered medications for this visit.    Allergies  Allergen Reactions  . Codeine     "tripped out on it"  . Morphine     "tripped out on it"    Past Medical History  Diagnosis Date  . Coronary artery disease   . Other and unspecified hyperlipidemia   . Hypertension   . Atrial fibrillation   . Sinoatrial node dysfunction   . PVD  (peripheral vascular disease)     s/p renal artery stent 20-04  . Pacemaker st Jude   . AAA (abdominal aortic aneurysm)     repaired  . Systolic congestive heart failure   . Cardiomyopathy     Past Surgical History  Procedure Laterality Date  . Inguinal hernia repair      left  . Cardiac pacemaker placement  08/01/2009    st jude dual chamber  . Colonoscopy    . Esophagogastroduodenoscopy    . Translumminal angioplasty    . Tee without cardioversion N/A 09/23/2012    Procedure: TRANSESOPHAGEAL ECHOCARDIOGRAM (TEE);  Surgeon: Lewayne Bunting, MD;  Location: [facility] ENDOSCOPY;  Service: Cardiovascular;  Laterality: N/A;  . Cardioversion N/A 09/23/2012    Procedure: CARDIOVERSION;  Surgeon: Lewayne Bunting, MD;  Location: [facility] ENDOSCOPY;  Service: Cardiovascular;  Laterality: N/A;    History  Smoking status  . Former Smoker  . Quit date: 03/19/1993  Smokeless tobacco  . Former Neurosurgeon  . Types: Chew    History  Alcohol Use No    Family History  Problem Relation Age of Onset  . CAD Father   . CAD Brother   . Hypertension Father   . Hypertension Brother   . Diabetes Brother   . Lung cancer Sister     Review of Systems: The review of systems is per the HPI.  All other systems were reviewed and are negative.  Physical Exam: BP 120/64  Pulse 60  Ht 6' (1.829 m)  Wt 197 lb (89.359 kg)  BMI 26.71 kg/m2 Patient is very pleasant and in no acute distress. Skin is warm and dry. Color is normal.  HEENT is unremarkable. Normocephalic/atraumatic. PERRL. Sclera are nonicteric. Neck is supple. No masses. No JVD. Lungs are clear. Cardiac exam shows a regular rate and rhythm today. Abdomen is soft. Extremities are without edema. Gait and ROM are intact. No gross neurologic deficits noted.  LABORATORY DATA: PENDING  Lab Results  Component Value Date   WBC 9.8 09/28/2012   HGB 15.6 09/28/2012   HCT 45.3 09/28/2012   PLT 158 09/28/2012   GLUCOSE 117* 09/29/2012   ALT 19 09/29/2012   AST 24  09/29/2012   NA 133* 09/29/2012   K 3.9 09/29/2012  CL 93* 09/29/2012   CREATININE 1.43* 09/29/2012   BUN 29* 09/29/2012   CO2 30 09/29/2012   TSH 4.152 09/15/2012   INR 2.67* 09/27/2012   Echo Study Conclusions from July 2014  - Left ventricle: Systolic function was severely reduced. The estimated ejection fraction was in the range of 20% to 25%. Diffuse hypokinesis. - Aortic valve: Cusp separation was reduced. Mild regurgitation. - Mitral valve: Moderate regurgitation. - Left atrium: The atrium was moderately dilated. No evidence of thrombus in the atrial cavity or appendage. There was spontaneous echo contrast ("smoke"). - Right ventricle: The cavity size was moderately dilated. Systolic function was severely reduced. - Right atrium: The atrium was moderately dilated. - Atrial septum: Doppler showed no right-to-left atrial level shunt. - Tricuspid valve: No evidence of vegetation. Moderate regurgitation. - Pulmonic valve: No evidence of vegetation.   Coronary angiography June 30th  Coronary dominance: right  Left mainstem: The left main is long with 20% stenosis distally.  Left anterior descending (LAD): The left anterior descending artery is moderately calcified in the proximal vessel. There is diffuse disease in the proximal vessel up to 30%. The first diagonal is relatively small and has mild disease proximally.  The ramus intermediate branch is a large branch that bifurcates in the mid vessel. The proximal vessel has a 90-95% stenosis.  Left circumflex (LCx): The left circumflex is relatively small. It gives rise to a single marginal branch and then continues in the AV groove. The first marginal branch has diffuse 60-70% stenosis proximally.  Right coronary artery (RCA): The right coronary is occluded proximally. There are right to right and left to right collaterals.  Left ventriculography: Left ventricular size is moderately increased. There is severe global hypokinesis that is  worse in the inferior wall distribution. Overall ejection fraction is estimated at 25-30%.  At this point we elected to proceed with PCI of the ramus intermediate branch. The right coronary occlusion is old and was noted in 2004. We exchanged for a 6 French arterial sheath. The patient was anticoagulated with IV bivalirudin. He was given Plavix 600 mg orally. After a therapeutic ACT was obtained a 6 [place] XB LAD 4 guide was inserted. A pro-water coronary guidewire was used to cross the lesion. The lesion was predilated with a 2.5 mm compliant balloon. The lesion was then stented with a 3.0 x 16 mm Veriflex stent. The stent was postdilated with a 3.0 mm noncompliant balloon. Following PCI, there was a  Angiographic result with 0% residual stenosis and TIMI grade 3 flow. Femoral hemostasis was obtained with manual compression. There were no complications.  Vessel: Ramus intermediate/proximal  Stenosis (pre-) 90-95%  TIMI flow (pre-) 3  Stent: 3.0 x 16 mm Veriflex  Stenosis (post) 0%  TIMI flow (post) 3  Final Conclusions:  1. Severe two-vessel obstructive coronary disease. 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  2. Severe left ventricular dysfunction.  3. Normal right heart pressures. Left ventricular filling pressures are satisfactory.  4. Successful stenting of the ramus intermediate branch with a bare-metal stent.  Recommendations: Would continue baby aspirin and Plavix for one month. Recommend resumption of anticoagulation tomorrow with Xarelto.  [name] [facility]  09/15/2012, 3:50 PM   Assessment / Plan: I have talked with Dr. Ladona Ridgel here in the office today - here is the plan we have formulated:  1. PAF - will cut the amiodarone back to 200 mg per day.  2. Systolic HF - EF of 25 to 30% - seems compensated.  Will check chemistries and BNP today.   3. Recent stroke - with carotid disease - was given the ok to proceed with CEA once 4 weeks out from his stent. Will alert Dr. Darrick Penna.  Family has an appointment at the end of August with Dr. Darrick Penna but they are wanting to proceed earlier if possible. Would defer to Dr. Darrick Penna.   4. Recent BMS to the intermediate branch - almost finished with his Plavix. Once finished, will add daily baby aspirin to his Xarelto. I am checking a CBC today to make sure his counts are ok.   He has follow up next month with Dr. Ladona Ridgel.   Patient is agreeable to this plan and will call if any problems develop in the interim.   Rosalio Macadamia, RN, ANP-C [facility] [address] [place], [place]  [address]

## 2012-10-14 NOTE — Patient Instructions (Addendum)
Cut the amiodarone back to 200 mg a day  Finish your Plavix - when you finish start a baby aspirin daily in its place  Stay on your other medicines  We need to check labs today  See Dr. Ladona Ridgel next month as planned  Call the [facility] office at [phone] if you have any questions, problems or concerns.

## 2012-10-20 ENCOUNTER — Encounter: Payer: Self-pay | Admitting: Internal Medicine

## 2012-10-20 ENCOUNTER — Ambulatory Visit (INDEPENDENT_AMBULATORY_CARE_PROVIDER_SITE_OTHER): Payer: Medicare Other | Admitting: *Deleted

## 2012-10-20 DIAGNOSIS — Z950 Presence of cardiac pacemaker: Secondary | ICD-10-CM

## 2012-10-20 DIAGNOSIS — I4891 Unspecified atrial fibrillation: Secondary | ICD-10-CM

## 2012-10-20 LAB — REMOTE PACEMAKER DEVICE
AL AMPLITUDE: 3 mv
BAMS-0001: 190 {beats}/min
BAMS-0003: 70 {beats}/min
BRDY-0002RV: 60 {beats}/min
DEVICE MODEL PM: 7105776
RV LEAD AMPLITUDE: 12 mv
RV LEAD IMPEDENCE PM: 490 Ohm
RV LEAD THRESHOLD: 1.125 V
VENTRICULAR PACING PM: 9.2

## 2012-10-28 ENCOUNTER — Ambulatory Visit (INDEPENDENT_AMBULATORY_CARE_PROVIDER_SITE_OTHER): Payer: Medicare Other | Admitting: Nurse Practitioner

## 2012-10-28 ENCOUNTER — Encounter: Payer: Self-pay | Admitting: Nurse Practitioner

## 2012-10-28 VITALS — BP 126/66 | HR 59 | Temp 97.0°F | Ht 71.0 in | Wt 195.0 lb

## 2012-10-28 DIAGNOSIS — I2510 Atherosclerotic heart disease of native coronary artery without angina pectoris: Secondary | ICD-10-CM

## 2012-10-28 DIAGNOSIS — I2589 Other forms of chronic ischemic heart disease: Secondary | ICD-10-CM

## 2012-10-28 DIAGNOSIS — I255 Ischemic cardiomyopathy: Secondary | ICD-10-CM

## 2012-10-28 DIAGNOSIS — I639 Cerebral infarction, unspecified: Secondary | ICD-10-CM

## 2012-10-28 DIAGNOSIS — I6350 Cerebral infarction due to unspecified occlusion or stenosis of unspecified cerebral artery: Secondary | ICD-10-CM

## 2012-10-28 DIAGNOSIS — I4891 Unspecified atrial fibrillation: Secondary | ICD-10-CM

## 2012-10-28 NOTE — Progress Notes (Signed)
[facility]  PATIENT: Trevor Simpson DOB: 11-Jul-1933   HISTORY FROM: patient, chart REASON FOR VISIT: routine follow up  HISTORY OF PRESENT ILLNESS:  Trevor Simpson is a 79 y.o. male with a history of CHF, A. fib who takes xarelto as well as Plavix. On 09/27/12 he woke up around 6 AM and felt okay. Then went back to sleep around 7 AM got up and found that he was unable to walk. He also noticed that he was numb on the left side. He was not a TPA candidate secondary to taking xarelto routinely.   UPDATE 10/28/12 (LL): Patient comes to office for stroke follow up.  Left-sided hemiparesis resolved quickly, mostly the day after presentation to the hospital.  Has been doing PT at home, getting stronger.  Planning to have right endarterectomy by Dr. Darrick Penna at the end of the month.  Right ICA is 82% stenosed.  Dr. Lubertha Basque office had him stop Plavix and start baby Aspirin in addition to his Xarelto.  Patient denies medication side effects, with no signs of bleeding or excessive bruising.    REVIEW OF SYSTEMS: Full 14 system review of systems performed and notable only for: constitutional: fatigue  cardiovascular: palpitations respiratory: short of breath endocrine: feeling cold, increased thirst ear/nose/throat: hearing loss, ringing in ears  Hematology/Lymph: easy bleeding, easy bruising musculoskeletal: N/A skin: birth marks genitourinary: Impotence Gastrointestinal: constipation allergy/immunology: runny nose neurological: N/A sleep: Sleepiness psychiatric: anxiety, decreased energy   ALLERGIES: Allergies  Allergen Reactions  . Codeine     "tripped out on it"  . Morphine     "tripped out on it"    HOME MEDICATIONS: Outpatient Prescriptions Prior to Visit  Medication Sig Dispense Refill  . ALPRAZolam (XANAX) 0.25 MG tablet Take 0.25 mg by mouth at bedtime as needed.       Marland Kitchen amiodarone (PACERONE) 200 MG tablet Take 1 tablet (200 mg total) by mouth daily.       Marland Kitchen aspirin EC 81 MG tablet Take 1 tablet (81 mg total) by mouth daily.  90 tablet  3  . atorvastatin (LIPITOR) 20 MG tablet Take 20 mg by mouth daily.        . B Complex-C (SUPER B COMPLEX) TABS Take 1 tablet by mouth daily.       . calcium carbonate (OS-CAL) 600 MG TABS Take 600 mg by mouth 2 (two) times daily with a meal.        . cetirizine (ZYRTEC) 10 MG tablet Take 10 mg by mouth daily.       . fish oil-omega-3 fatty acids 1000 MG capsule Take 1 g by mouth 2 (two) times daily.       . furosemide (LASIX) 80 MG tablet Take 1 tablet (80 mg total) by mouth daily.  90 tablet  0  . levothyroxine (SYNTHROID, LEVOTHROID) 112 MCG tablet Take 112 mcg by mouth daily.      . magnesium gluconate (MAGONATE) 500 MG tablet Take 500 mg by mouth daily.       . metoprolol (LOPRESSOR) 50 MG tablet Take 1 tablet (50 mg total) by mouth 2 (two) times daily.  60 tablet  11  . Multiple Vitamin (MULTIVITAMIN WITH MINERALS) TABS Take 1 tablet by mouth daily.      . nitroGLYCERIN (NITROSTAT) 0.4 MG SL tablet Place 1 tablet (0.4 mg total) under the tongue every 5 (five) minutes as needed for chest pain.  25 tablet  3  . omeprazole (PRILOSEC) 20  MG capsule Take 20 mg by mouth daily as needed (indigestion).       . Rivaroxaban (XARELTO) 20 MG TABS Take 20 mg by mouth every evening.      . Saw Palmetto, Serenoa repens, (SAW PALMETTO PO) Take 1 capsule by mouth 2 (two) times daily.      . vitamin C (ASCORBIC ACID) 500 MG tablet Take 500 mg by mouth daily.        . Cholecalciferol 1000 UNITS capsule Take 1,000 Units by mouth daily.        . clopidogrel (PLAVIX) 75 MG tablet Take 1 tablet (75 mg total) by mouth daily with breakfast.  30 tablet  0   No facility-administered medications prior to visit.    PAST MEDICAL HISTORY: Past Medical History  Diagnosis Date  . Coronary artery disease   . Other and unspecified hyperlipidemia   . Hypertension   . Atrial fibrillation   . Sinoatrial node dysfunction   . PVD  (peripheral vascular disease)     s/p renal artery stent 20-04  . Pacemaker st Jude   . AAA (abdominal aortic aneurysm)     repaired  . Systolic congestive heart failure   . Cardiomyopathy     PAST SURGICAL HISTORY: Past Surgical History  Procedure Laterality Date  . Inguinal hernia repair      left  . Cardiac pacemaker placement  08/01/2009    st jude dual chamber  . Colonoscopy    . Esophagogastroduodenoscopy    . Translumminal angioplasty    . Tee without cardioversion N/A 09/23/2012    Procedure: TRANSESOPHAGEAL ECHOCARDIOGRAM (TEE);  Surgeon: Lewayne Bunting, MD;  Location: [facility] ENDOSCOPY;  Service: Cardiovascular;  Laterality: N/A;  . Cardioversion N/A 09/23/2012    Procedure: CARDIOVERSION;  Surgeon: Lewayne Bunting, MD;  Location: [facility] ENDOSCOPY;  Service: Cardiovascular;  Laterality: N/A;    FAMILY HISTORY: Family History  Problem Relation Age of Onset  . CAD Father   . CAD Brother   . Hypertension Father   . Hypertension Brother   . Diabetes Brother   . Lung cancer Sister     SOCIAL HISTORY: History   Social History  . Marital Status: Married    Spouse Name: N/A    Number of Children: N/A  . Years of Education: N/A   Occupational History  . Not on file.   Social History Main Topics  . Smoking status: Former Smoker    Quit date: 03/19/1993  . Smokeless tobacco: Former Neurosurgeon    Types: Chew  . Alcohol Use: No  . Drug Use: No  . Sexually Active: Not Currently   Other Topics Concern  . Not on file   Social History Narrative  . No narrative on file     PHYSICAL EXAM  Filed Vitals:   10/28/12 1325  BP: 126/66  Pulse: 59  Temp: 97 F (36.1 C)  TempSrc: Oral  Height: 5\' 11"  (1.803 m)  Weight: 195 lb (88.451 kg)   Body mass index is 27.21 kg/(m^2).  Generalized: In no acute distress, pleasant Caucasian male  Neck: Supple, no carotid bruits   Cardiac: Regular rate rhythm, no murmur   Pulmonary: Clear to auscultation bilaterally    Musculoskeletal: No deformity   Neurological examination   Mentation: Alert oriented to time, place, history taking, language fluent, and casual conversation  Cranial nerve II-XII: Pupils were equal round reactive to light extraocular movements were full, visual field were full on confrontational test. facial sensation  and strength were normal. hearing was intact to finger rubbing bilaterally. Uvula tongue midline. head turning and shoulder shrug and were normal and symmetric.Tongue protrusion into cheek strength was normal. MOTOR: normal bulk and tone, full strength in the BUE, BLE, fine finger movements normal, no pronator drift SENSORY: normal and symmetric to light touch, pinprick, temperature, vibration and proprioception COORDINATION: finger-nose-finger, heel-to-shin bilaterally, there was no truncal ataxia REFLEXES: Equal and symmetric GAIT/STATION: Rising up from seated position without assistance, mildly stooped stance, without trunk ataxia, moderate stride, good arm swing, smooth turning, able to perform tiptoe, and heel walking without difficulty. Romberg negative.   DIAGNOSTIC DATA (LABS, IMAGING, TESTING) - I reviewed patient records, labs, notes, testing and imaging myself where available.  Lab Results  Component Value Date   WBC 6.7 10/14/2012   HGB 15.3 10/14/2012   HCT 46.9 10/14/2012   MCV 96.5 10/14/2012   PLT 158.0 10/14/2012      Component Value Date/Time   NA 135 10/14/2012 1232   K 4.2 10/14/2012 1232   CL 99 10/14/2012 1232   CO2 29 10/14/2012 1232   GLUCOSE 101* 10/14/2012 1232   BUN 16 10/14/2012 1232   CREATININE 1.4 10/14/2012 1232   CALCIUM 9.0 10/14/2012 1232   PROT 7.2 09/29/2012 0625   ALBUMIN 3.5 09/29/2012 0625   AST 24 09/29/2012 0625   ALT 19 09/29/2012 0625   ALKPHOS 64 09/29/2012 0625   BILITOT 2.0* 09/29/2012 0625   GFRNONAA 46* 09/29/2012 0625   GFRAA 53* 09/29/2012 0625   No results found for this basename: CHOL,  HDL,  LDLCALC,  LDLDIRECT,  TRIG,   CHOLHDL   No results found for this basename: HGBA1C   No results found for this basename: VITAMINB12   Lab Results  Component Value Date   TSH 4.152 09/15/2012   CTA NECK 09/27/2012 Bilateral pleural effusions. Irregular shaggy plaque of the visualized aspect of the thoracic aorta (most notable distal arch and descending thoracic aorta). Ectatic ascending thoracic aorta measures up to 3.7 cm. Calcified plaque origin of the great vessels with mild narrowing innominate artery, mild to moderate narrowing proximal left common carotid artery and moderate narrowing origin of the left subclavian artery. Right carotid bifurcation calcified irregular plaque with 82% diameter stenosis of the proximal right internal carotid artery. Left carotid bifurcation plaque with 54% diameter stenosis proximal left internal carotid artery. Plaque with mild narrowing and irregularity right subclavian artery. Mild to slightly moderate narrowing proximal right vertebral artery. Proximal aspect of the left vertebral artery is not visualized. Minimal flow is seen within the vertical segment possibly from collateral flow. Fullness of the left posterior-superior nasopharynx may represent coaptation membranes although mucosa abnormality cannot be excluded.   CTA HEAD 09/27/2012 No intracranial hemorrhage. No intracranial enhancing lesion. Small vessel disease type changes most notable anterior right external capsule region. No CT evidence of large acute infarct. Right vertebral artery is dominant. Moderate tandem stenosis of the vertebral arteries. Mild narrowing of portions of the basilar artery. Irregularity with narrowing of portions of the PICAs, AICAs, superior cerebral arteries and posterior cerebral arteries. Petrous and cavernous segment internal carotid artery moderate narrowing and irregularity with calcified plaque. Mild narrowing M1 segment of the middle cerebral artery more notable on the right. Aplastic A1 segment right  anterior cerebral artery. Middle cerebral artery and anterior cerebral artery branch vessel irregularity bilaterally.   Ct Head Wo Contrast 09/27/2012 No acute abnormality.   MRI of the brain the patient has a pacemaker  MRA of the brain the patient has a pacemaker   2D Echocardiogram 09/14/2012 EF 25-30% with no source of emboli.  Compared to prior study April 2011 there has been reduction in LV systolic function, now LVEF 25-30% with apparent inferior WMA, and septal dyssynchrony. Moderate diastolic dysfunction. Signfiicant left atrial enlargement. Mitral regurgitation has progressed to moderate range. Pacer wire noted in right heart. Moderate RV enlargement.Marland Kitchen PASP 27 mmHg with increased CVP.   TEE 09/23/12 - ejection fraction 20-25%. No cardiac source of emboli was identified.  Carotid Doppler see CTA of the neck   ASSESSMENT AND PLAN Mr. KERRIE LATOUR is a 79 y.o. male presenting with left hemisensory deficits. TPA was not given secondary to the fact that the patient was already on Xarelto.  Infarct felt to be embolic secondary to atrial fibrillation and/or right ICA stenosis.   Patient with resolution of deficits. Planned right endarterectomy within 4 weeks with Dr. Fabienne Bruns.  Continue xarelto for secondary stroke prevention from afib as well as continue Aspirin 81 mg orally every day for secondary stroke prevention given recent PCI/bare metal stent, per Dr. Ladona Ridgel.  Maintain strict control of hypertension with blood pressure goal below 130/90, diabetes with hemoglobin A1c goal below 6.5% and lipids with LDL cholesterol goal below 100 mg/dL.   Followup in 3 months.  Rogan Wigley NP-C 10/28/2012, 2:26 PM  [facility] [address] [place], [place] [address] [phone]

## 2012-10-28 NOTE — Patient Instructions (Addendum)
Continue xarelto and Aspirin 81 mg daily for secondary stroke prevention from afib  orally every day for secondary stroke prevention given recent PCI/bare metal stent.  Maintain strict control of hypertension with blood pressure goal below 130/90, diabetes with hemoglobin A1c goal below 6.5% and lipids with LDL cholesterol goal below 100 mg/dL.   Followup in 3 months. STROKE/TIA INSTRUCTIONS SMOKING Cigarette smoking nearly doubles your risk of having a stroke & is the single most alterable risk factor  If you smoke or have smoked in the last 12 months, you are advised to quit smoking for your health.  Most of the excess cardiovascular risk related to smoking disappears within a year of stopping.  Ask you doctor about anti-smoking medications  [place] Quit Line: 1-800-QUIT NOW  Free Smoking Cessation Classes ([phone]  CHOLESTEROL Know your levels; limit fat & cholesterol in your diet  No results found for this basename: CHOL, HDL, LDLCALC, LDLDIRECT, TRIG, CHOLHDL      Many patients benefit from treatment even if their cholesterol is at goal.  Goal: Total Cholesterol less than 160  Goal:  LDL less than 100  Goal:  HDL greater than 40  Goal:  Triglycerides less than 150  BLOOD PRESSURE American Stroke Association blood pressure target is less that 120/80 mm/Hg  Your discharge blood pressure is:  BP: 126/66 mmHg  Monitor your blood pressure  Limit your salt and alcohol intake  Many individuals will require more than one medication for high blood pressure  DIABETES (A1c is a blood sugar average for last 3 months) Goal A1c is under 7% (A1c is blood sugar average for last 3 months)  Diabetes: No known diagnosis of diabetes    No results found for this basename: HGBA1C    Your A1c can be lowered with medications, healthy diet, and exercise.  Check your blood sugar as directed by your physician  Call your physician if you experience unexplained or low blood sugars.  PHYSICAL  ACTIVITY/REHABILITATION Goal is 30 minutes at least 4 days per week    Activity decreases your risk of heart attack and stroke and makes your heart stronger.  It helps control your weight and blood pressure; helps you relax and can improve your mood.  Participate in a regular exercise program.  Talk with your doctor about the best form of exercise for you (dancing, walking, swimming, cycling).  DIET/WEIGHT Goal is to maintain a healthy weight  Your height is:  Height: 5\' 11"  (180.3 cm) Your current weight is: Weight: 195 lb (88.451 kg) Your body Mass Index (BMI) is:  BMI (Calculated): 27.3  Following the type of diet specifically designed for you will help prevent another stroke.  Your goal Body Mass Index (BMI) is 19-24.  Healthy food habits can help reduce 3 risk factors for stroke:  High cholesterol, hypertension, and excess weight.

## 2012-11-07 ENCOUNTER — Encounter: Payer: Self-pay | Admitting: *Deleted

## 2012-11-11 ENCOUNTER — Encounter: Payer: Self-pay | Admitting: Internal Medicine

## 2012-11-11 ENCOUNTER — Ambulatory Visit (INDEPENDENT_AMBULATORY_CARE_PROVIDER_SITE_OTHER): Payer: Medicare Other | Admitting: Internal Medicine

## 2012-11-11 VITALS — BP 132/63 | HR 59 | Ht 72.0 in | Wt 197.0 lb

## 2012-11-11 DIAGNOSIS — I255 Ischemic cardiomyopathy: Secondary | ICD-10-CM

## 2012-11-11 DIAGNOSIS — I4891 Unspecified atrial fibrillation: Secondary | ICD-10-CM

## 2012-11-11 DIAGNOSIS — Z950 Presence of cardiac pacemaker: Secondary | ICD-10-CM

## 2012-11-11 DIAGNOSIS — I495 Sick sinus syndrome: Secondary | ICD-10-CM

## 2012-11-11 DIAGNOSIS — I2589 Other forms of chronic ischemic heart disease: Secondary | ICD-10-CM

## 2012-11-11 LAB — PACEMAKER DEVICE OBSERVATION
AL THRESHOLD: 1.5 V
BAMS-0001: 190 {beats}/min
DEVICE MODEL PM: 7105776
RV LEAD THRESHOLD: 1 V

## 2012-11-11 NOTE — Assessment & Plan Note (Signed)
His St. Jude dual-chamber pacemaker is working normally except that he does have some increased pacing threshold in the atrium. We checked his device unipolar and bipolar today, and may programming changes as needed.

## 2012-11-11 NOTE — Progress Notes (Signed)
HPI [name] returns today for followup. He is a pleasant 79 yo man with symptomatic bradycardia, s/p PPM, PAF, HTN, carotid vascular disease, and prior stroke. In the interim, he is improved. His residual speech deficits are much better. He has almost no weakness. He denies chest pain or shortness of breath. He is tolerating his anticoagulation, and has maintain sinus rhythm nicely. Allergies  Allergen Reactions  . Codeine     "tripped out on it"  . Morphine     "tripped out on it"     Current Outpatient Prescriptions  Medication Sig Dispense Refill  . ALPRAZolam (XANAX) 0.25 MG tablet Take 0.25 mg by mouth at bedtime as needed.       Marland Kitchen amiodarone (PACERONE) 200 MG tablet Take 1 tablet (200 mg total) by mouth daily.      Marland Kitchen aspirin EC 81 MG tablet Take 1 tablet (81 mg total) by mouth daily.  90 tablet  3  . atorvastatin (LIPITOR) 20 MG tablet Take 20 mg by mouth daily.        . B Complex-C (SUPER B COMPLEX) TABS Take 1 tablet by mouth daily.       . calcium carbonate (OS-CAL) 600 MG TABS Take 600 mg by mouth 2 (two) times daily with a meal.        . cetirizine (ZYRTEC) 10 MG tablet Take 10 mg by mouth daily.       . fish oil-omega-3 fatty acids 1000 MG capsule Take 1 g by mouth 2 (two) times daily.       . furosemide (LASIX) 80 MG tablet Take 1 tablet (80 mg total) by mouth daily.  90 tablet  0  . levothyroxine (SYNTHROID, LEVOTHROID) 112 MCG tablet Take 112 mcg by mouth daily.      . magnesium gluconate (MAGONATE) 500 MG tablet Take 500 mg by mouth daily.       . metoprolol (LOPRESSOR) 50 MG tablet Take 1 tablet (50 mg total) by mouth 2 (two) times daily.  60 tablet  11  . Multiple Vitamin (MULTIVITAMIN WITH MINERALS) TABS Take 1 tablet by mouth daily.      . nitroGLYCERIN (NITROSTAT) 0.4 MG SL tablet Place 1 tablet (0.4 mg total) under the tongue every 5 (five) minutes as needed for chest pain.  25 tablet  3  . omeprazole (PRILOSEC) 20 MG capsule Take 20 mg by mouth daily as needed  (indigestion).       . Rivaroxaban (XARELTO) 20 MG TABS Take 20 mg by mouth every evening.      . Saw Palmetto, Serenoa repens, (SAW PALMETTO PO) Take 1 capsule by mouth 2 (two) times daily.      . vitamin C (ASCORBIC ACID) 500 MG tablet Take 500 mg by mouth daily.        . [DISCONTINUED] diltiazem (CARDIZEM CD) 120 MG 24 hr capsule       . [DISCONTINUED] warfarin (COUMADIN) 4 MG tablet Take 4 mg by mouth daily.         No current facility-administered medications for this visit.     Past Medical History  Diagnosis Date  . Coronary artery disease   . Other and unspecified hyperlipidemia   . Hypertension   . Atrial fibrillation   . Sinoatrial node dysfunction   . PVD (peripheral vascular disease)     s/p renal artery stent 20-04  . Pacemaker st Jude   . AAA (abdominal aortic aneurysm)     repaired  .  Systolic congestive heart failure   . Cardiomyopathy     ROS:   All systems reviewed and negative except as noted in the HPI.   Past Surgical History  Procedure Laterality Date  . Inguinal hernia repair      left  . Cardiac pacemaker placement  08/01/2009    st jude dual chamber  . Colonoscopy    . Esophagogastroduodenoscopy    . Translumminal angioplasty    . Tee without cardioversion N/A 09/23/2012    Procedure: TRANSESOPHAGEAL ECHOCARDIOGRAM (TEE);  Surgeon: Lewayne Bunting, MD;  Location: [facility] ENDOSCOPY;  Service: Cardiovascular;  Laterality: N/A;  . Cardioversion N/A 09/23/2012    Procedure: CARDIOVERSION;  Surgeon: Lewayne Bunting, MD;  Location: [facility] ENDOSCOPY;  Service: Cardiovascular;  Laterality: N/A;     Family History  Problem Relation Age of Onset  . CAD Father   . CAD Brother   . Hypertension Father   . Hypertension Brother   . Diabetes Brother   . Lung cancer Sister      History   Social History  . Marital Status: Married    Spouse Name: N/A    Number of Children: N/A  . Years of Education: N/A   Occupational History  . Not on file.   Social  History Main Topics  . Smoking status: Former Smoker    Quit date: 03/19/1993  . Smokeless tobacco: Former Neurosurgeon    Types: Chew  . Alcohol Use: No  . Drug Use: No  . Sexual Activity: Not Currently   Other Topics Concern  . Not on file   Social History Narrative  . No narrative on file     BP 132/63  Pulse 59  Ht 6' (1.829 m)  Wt 197 lb (89.359 kg)  BMI 26.71 kg/m2  Physical Exam:  Well appearing 79 year old man,NAD HEENT: Unremarkable Neck:  7 cm JVD, no thyromegally Back:  No CVA tenderness Lungs:  Clear with no wheezes, rales, or rhonchi. Well-healed pacemaker incision. HEART:  Regular rate rhythm, no murmurs, no rubs, no clicks Abd:  soft, positive bowel sounds, no organomegally, no rebound, no guarding Ext:  2 plus pulses, no edema, no cyanosis, no clubbing Skin:  No rashes no nodules Neuro:  CN II through XII intact, motor grossly intact   DEVICE  Normal device function.  See PaceArt for details.   Assess/Plan:

## 2012-11-11 NOTE — Assessment & Plan Note (Signed)
He is status post stenting with no anginal symptoms. He'll continue his current medical therapy, and I've encouraged the patient to increase his physical activity.

## 2012-11-11 NOTE — Assessment & Plan Note (Signed)
He is maintaining sinus rhythm very nicely on low-dose amiodarone. He will continue this medication, his beta blocker, and anticoagulation with Xarelto.

## 2012-11-11 NOTE — Patient Instructions (Addendum)
Remote monitoring is used to monitor your Pacemaker of ICD from home. This monitoring reduces the number of office visits required to check your device to one time per year. It allows [place] to keep an eye on the functioning of your device to ensure it is working properly. You are scheduled for a device check from home on 02/16/13. You may send your transmission at any time that day. If you have a wireless device, the transmission will be sent automatically. After your physician reviews your transmission, you will receive a postcard with your next transmission date.   Your physician wants you to follow-up in: ONE YEAR WITH DR Court Joy will receive a reminder letter in the mail two months in advance. If you don't receive a letter, please call our office to schedule the follow-up appointment.

## 2012-11-12 ENCOUNTER — Telehealth: Payer: Self-pay | Admitting: Internal Medicine

## 2012-11-12 ENCOUNTER — Encounter: Payer: Self-pay | Admitting: Vascular Surgery

## 2012-11-12 NOTE — Telephone Encounter (Signed)
LMOM for nurse that he was in NSR yesterday.  If she felt it was normal it probably is and he should fpllow up with his PCP for weakness

## 2012-11-12 NOTE — Telephone Encounter (Signed)
New problem   Angela/[facility] stated pt felt like heart was out of rhythm. Pt has no energy today. Nurse listen to heart and sounded regular.

## 2012-11-13 ENCOUNTER — Ambulatory Visit (INDEPENDENT_AMBULATORY_CARE_PROVIDER_SITE_OTHER): Payer: Medicare Other | Admitting: Vascular Surgery

## 2012-11-13 ENCOUNTER — Other Ambulatory Visit: Payer: Self-pay | Admitting: *Deleted

## 2012-11-13 ENCOUNTER — Other Ambulatory Visit (INDEPENDENT_AMBULATORY_CARE_PROVIDER_SITE_OTHER): Payer: Medicare Other | Admitting: Vascular Surgery

## 2012-11-13 ENCOUNTER — Encounter: Payer: Self-pay | Admitting: Vascular Surgery

## 2012-11-13 ENCOUNTER — Encounter: Payer: Self-pay | Admitting: *Deleted

## 2012-11-13 DIAGNOSIS — I6529 Occlusion and stenosis of unspecified carotid artery: Secondary | ICD-10-CM

## 2012-11-13 MED ORDER — ENOXAPARIN SODIUM 40 MG/0.4ML ~~LOC~~ SOLN: 80.0000 mg | Freq: Two times a day (BID) | SUBCUTANEOUS | Status: DC

## 2012-11-14 ENCOUNTER — Telehealth: Payer: Self-pay | Admitting: Internal Medicine

## 2012-11-14 NOTE — Progress Notes (Signed)
History of Present Illness: Patient is a 79 y.o. year old male who presents for evaluation of carotid stenosis. Was previously seen in the hospital after a stroke but also had several exacerbations of congestive heart failure recently. Symptoms related to this stenosis include recent right brain stroke with left face arm and leg weakness. This is now essentially resolved except for some occasional speech problems .The patient also has history of atrial fibrillation and is on Xarelto and aspirin. The patient is currently on antiplatelet therapy. The carotid stenosis was found on screening duplex and has been [facility] but now seems to have progressed. Other medical problems include CHF with multiple admissions but none in the last month EF 25%. CAD with recent cath by Dr Jordan RCA occluded and 2 vessel disease otherwise.  He also has history of hypertension and has a pacemaker for bradycardia. His primary cardiologist is Dr Taylor. The patient denies prior stroke or TIA before this. Currently on Plavix and statin.    Past Medical History   Diagnosis  Date   .  Coronary artery disease    .  Other and unspecified hyperlipidemia    .  Hypertension    .  Atrial fibrillation    .  Sinoatrial node dysfunction    .  PVD (peripheral vascular disease)      s/p renal artery stent 20-04   .  Pacemaker st Jude    .  AAA (abdominal aortic aneurysm)      repaired   .  Systolic congestive heart failure    .  Cardiomyopathy     Past Surgical History   Procedure  Laterality  Date   .  Inguinal hernia repair       left   .  Cardiac pacemaker placement   08/01/2009     st jude dual chamber   .  Colonoscopy     .  Esophagogastroduodenoscopy     .  Translumminal angioplasty     .  Tee without cardioversion  N/A  09/23/2012     Procedure: TRANSESOPHAGEAL ECHOCARDIOGRAM (TEE); Surgeon: Brian S Crenshaw, MD; Location: [facility] ENDOSCOPY; Service: Cardiovascular; Laterality: N/A;   .  Cardioversion   N/A  09/23/2012     Procedure: CARDIOVERSION; Surgeon: Brian S Crenshaw, MD; Location: [facility] ENDOSCOPY; Service: Cardiovascular; Laterality: N/A;   Social History  History   Substance Use Topics   .  Smoking status:  Former Smoker     Quit date:  03/19/1993   .  Smokeless tobacco:  Former User     Types:  Chew   .  Alcohol Use:  No   Family History  Family History   Problem  Relation  Age of Onset   .  CAD  Father    .  CAD  Brother    .  Hypertension  Father    .  Hypertension  Brother    .  Diabetes  Brother    .  Lung cancer  Sister    Allergies  Allergies   Allergen  Reactions   .  Codeine      "tripped out on it"   .  Morphine      "tripped out on it"     Current Outpatient Prescriptions on File Prior to Visit  Medication Sig Dispense Refill  . ALPRAZolam (XANAX) 0.25 MG tablet Take 0.25 mg by mouth at bedtime as needed.       . amiodarone (PACERONE) 200 MG   tablet Take 1 tablet (200 mg total) by mouth daily.      . aspirin EC 81 MG tablet Take 1 tablet (81 mg total) by mouth daily.  90 tablet  3  . atorvastatin (LIPITOR) 20 MG tablet Take 20 mg by mouth daily.        . B Complex-C (SUPER B COMPLEX) TABS Take 1 tablet by mouth daily.       . calcium carbonate (OS-CAL) 600 MG TABS Take 600 mg by mouth 2 (two) times daily with a meal.        . cetirizine (ZYRTEC) 10 MG tablet Take 10 mg by mouth daily.       . fish oil-omega-3 fatty acids 1000 MG capsule Take 1 g by mouth 2 (two) times daily.       . furosemide (LASIX) 80 MG tablet Take 1 tablet (80 mg total) by mouth daily.  90 tablet  0  . levothyroxine (SYNTHROID, LEVOTHROID) 112 MCG tablet Take 112 mcg by mouth daily.      . magnesium gluconate (MAGONATE) 500 MG tablet Take 500 mg by mouth daily.       . metoprolol (LOPRESSOR) 50 MG tablet Take 1 tablet (50 mg total) by mouth 2 (two) times daily.  60 tablet  11  . Multiple Vitamin (MULTIVITAMIN WITH MINERALS) TABS Take 1 tablet by mouth daily.      . nitroGLYCERIN  (NITROSTAT) 0.4 MG SL tablet Place 1 tablet (0.4 mg total) under the tongue every 5 (five) minutes as needed for chest pain.  25 tablet  3  . omeprazole (PRILOSEC) 20 MG capsule Take 20 mg by mouth daily as needed (indigestion).       . Rivaroxaban (XARELTO) 20 MG TABS Take 20 mg by mouth every evening.      . Saw Palmetto, Serenoa repens, (SAW PALMETTO PO) Take 1 capsule by mouth 2 (two) times daily.      . vitamin C (ASCORBIC ACID) 500 MG tablet Take 500 mg by mouth daily.        . [DISCONTINUED] diltiazem (CARDIZEM CD) 120 MG 24 hr capsule       . [DISCONTINUED] warfarin (COUMADIN) 4 MG tablet Take 4 mg by mouth daily.         No current facility-administered medications on file prior to visit.   ROS:  General: No weight loss, Fever, chills  HEENT: No recent headaches, no nasal bleeding, no visual changes, no sore throat  Neurologic: No dizziness, blackouts, seizures. + recent symptoms of stroke or mini- stroke. Persistent slurred speech, no temporary blindness.  Cardiac: No recent episodes of chest pain/pressure, + shortness of breath at rest. + shortness of breath with exertion. + history of atrial fibrillation or irregular heartbeat  Vascular: No history of rest pain in feet. No history of claudication. No history of non-healing ulcer, No history of DVT  Pulmonary: No home oxygen, no productive cough, no hemoptysis  Musculoskeletal: [x ] Arthritis, [ ] Low back pain, [ ] Joint pain  Hematologic:No history of hypercoagulable state. No history of easy bleeding. No history of anemia  Gastrointestinal: No hematochezia or melena, No gastroesophageal reflux, no trouble swallowing  Urinary: [ ] chronic Kidney disease, [ ] on HD - [ ] MWF or [ ] TTHS, [ ] Burning with urination, [ ] Frequent urination, [ ] Difficulty urinating;  Skin: No rashes  Psychological: + history of anxiety, No history of depression    Physical Examination   Filed   Vitals:   11/13/12 1415 11/13/12 1420  BP: 146/69  144/67  Pulse: 60   Height: 6' (1.829 m)   Weight: 191 lb (86.637 kg)   SpO2: 98%    Body mass index is 26.49 kg/(m^2).  General: Alert and oriented, no acute distress, occasional slurred speech  HEENT: Normal  Neck: No JVD  Pulmonary: Few crackles in bases  Cardiac: Regular Rate and Rhythm  Gastrointestinal: Soft, non-tender, non-distended, no mass  Skin: No rash  Extremity Pulses: 2+ radial, brachial, femoral pulses bilaterally  Musculoskeletal: No deformity or edema  Neurologic: Upper and lower extremity motor 5/5 and symmetric, no obvious facial droop   DATA: Review of recent imaging CT head no bleed, some atrophy. CTA neck chest shaggy arch and desc thoracic aorta, 80% right ICA but calcified and may be overestimate but probably at least 50-70%. Left IcA 50%   Duplex ultrasound of the carotids was repeated today. I reviewed and interpreted this study. This showed a 40-60% right internal carotid artery stenosis but probably underestimated due to severe calcification. Left carotid 40%. Vertebral arteries patent antegrade bilaterally.  ASSESSMENT: Recent stroke atheroembolic most likely since he was on Xarelto for his afib. He CHF seems to be compensated at this point Carotid stenting is not a great option sent he has significant atheroma burden in his arch and severe calcification of the bifurcation which would certainly increase stroke risk. I believe his best option for this symptomatic carotid stenosis his right carotid endarterectomy. Risks benefits possible complications and procedure details including but not limited to bleeding infection stroke myocardial risk and cranial nerve injury were explained the patient and his family today. The understand agree to proceed.   PLAN: Right carotid endarterectomy 11/25/2012   Rocko Fesperman, MD  [facility]   Office: [phone]  Pager: [phone]   

## 2012-11-14 NOTE — Telephone Encounter (Signed)
Pt called answering service after hours. See prior phone note. Just hasn't felt well the last few days - this has also been going on for months but has been worse the last 3 days. No acute symptoms today. Denies pain or SOB, just feeling fatigued. D/w Dr. Ladona Ridgel given recent OV with pacemaker adjustment. He advised patient to come in on Tuesday for pacemaker adjustment. I left msg on our after hours scheduling line to make them aware. Pt instructed to call Tuesday AM when we open to ask what time he should come in. (clinic closed Mon for labor day.) Advised patient that if he feels acutely worse in the meantime, he should proceed to ER (likewise if he develops any CP, SOB or any other symptoms concerning to him). He verbalized understanding and gratitude. Finlee Milo PA-C

## 2012-11-14 NOTE — Telephone Encounter (Addendum)
Pt feeling really tired since last adjustment, not sleeping well, feels horrible, pls call pt asap per wife since its Friday , pt [phone]

## 2012-11-14 NOTE — Telephone Encounter (Signed)
[facility] 11/14/12 16:25

## 2012-11-18 ENCOUNTER — Ambulatory Visit (INDEPENDENT_AMBULATORY_CARE_PROVIDER_SITE_OTHER): Payer: Medicare Other | Admitting: *Deleted

## 2012-11-18 DIAGNOSIS — Z950 Presence of cardiac pacemaker: Secondary | ICD-10-CM

## 2012-11-18 NOTE — Progress Notes (Signed)
Pt in today c/o of not having as much strength after changes were made on 11/11/12 (PVARP extension to avoid PMT). Pt still having retrograde conduction, can feel his retrograde "episodes," and wants his parameters reset to prior settings.  After explaining that his episodes (PMT) were not natural, Trevor Simpson said he would continue to try to adjust to his new settings. No changes made today.

## 2012-11-18 NOTE — Telephone Encounter (Signed)
Spoke w/pt's wife and scheduled for pacer check at 1500 today.

## 2012-11-18 NOTE — Telephone Encounter (Signed)
LMOM on [phone]

## 2012-11-20 ENCOUNTER — Encounter (HOSPITAL_COMMUNITY)
Admission: RE | Admit: 2012-11-20 | Discharge: 2012-11-20 | Disposition: A | Discharge status: Home / Self Care | Payer: Medicare Other | Source: Ambulatory Visit | Attending: Vascular Surgery | Admitting: Vascular Surgery

## 2012-11-20 ENCOUNTER — Other Ambulatory Visit (HOSPITAL_COMMUNITY): Payer: Self-pay | Admitting: *Deleted

## 2012-11-20 ENCOUNTER — Encounter (HOSPITAL_COMMUNITY): Payer: Self-pay

## 2012-11-20 DIAGNOSIS — Z01818 Encounter for other preprocedural examination: Secondary | ICD-10-CM | POA: Insufficient documentation

## 2012-11-20 DIAGNOSIS — Z01812 Encounter for preprocedural laboratory examination: Secondary | ICD-10-CM | POA: Insufficient documentation

## 2012-11-20 HISTORY — DX: Gastro-esophageal reflux disease without esophagitis: K21.9

## 2012-11-20 HISTORY — DX: Hypothyroidism, unspecified: E03.9

## 2012-11-20 HISTORY — DX: Unspecified osteoarthritis, unspecified site: M19.90

## 2012-11-20 HISTORY — DX: Cerebral infarction, unspecified: I63.9

## 2012-11-20 LAB — COMPREHENSIVE METABOLIC PANEL
ALT: 21 U/L (ref 0–53)
CO2: 32 mEq/L (ref 19–32)
Calcium: 9.9 mg/dL (ref 8.4–10.5)
GFR calc Af Amer: 61 mL/min — ABNORMAL LOW (ref >90)
GFR calc non Af Amer: 53 mL/min — ABNORMAL LOW (ref >90)
Glucose, Bld: 90 mg/dL (ref 70–99)
Sodium: 136 mEq/L (ref 135–145)

## 2012-11-20 LAB — CBC
HCT: 47.7 % (ref 39.0–52.0)
Hemoglobin: 16.2 g/dL (ref 13.0–17.0)
MCH: 32.1 pg (ref 26.0–34.0)
MCV: 94.6 fL (ref 78.0–100.0)
RBC: 5.04 MIL/uL (ref 4.22–5.81)

## 2012-11-20 LAB — URINALYSIS, ROUTINE W REFLEX MICROSCOPIC
Ketones, ur: NEGATIVE mg/dL
Leukocytes, UA: NEGATIVE
Nitrite: NEGATIVE
Protein, ur: NEGATIVE mg/dL
pH: 7 (ref 5.0–8.0)

## 2012-11-20 LAB — TYPE AND SCREEN: ABO/RH(D): A POS

## 2012-11-20 LAB — SURGICAL PCR SCREEN: Staphylococcus aureus: NEGATIVE

## 2012-11-20 NOTE — Pre-Procedure Instructions (Signed)
Trevor Simpson  11/20/2012   Your procedure is scheduled on:  November 25, 2012 at 7:30 AM  Report to [facility] at 5:30 AM.  Call this number if you have problems the morning of surgery: [phone]   Remember:   Do not eat food or drink liquids after midnight.   Take these medicines the morning of surgery with A SIP OF WATER: amiodarone (PACERONE), cetirizine (ZYRTEC), levothyroxine (SYNTHROID, LEVOTHROID), metoprolol (LOPRESSOR), omeprazole (PRILOSEC), and nitroGLYCERIN (NITROSTAT) - if needed.   STOP Super B Complex, Os- Cal, Fish Oil, Magnesium, Multiple Vitamins, Saw Palmetto, Vitamin C today  Do not wear jewelry, make-up or nail polish.  Do not wear lotions, powders, or perfumes. You may wear deodorant.  Do not shave 48 hours prior to surgery. Men may shave face and neck.  Do not bring valuables to the hospital.  [facility] is not responsible                   for any belongings or valuables.  Contacts, dentures or bridgework may not be worn into surgery.  Leave suitcase in the car. After surgery it may be brought to your room.  For patients admitted to the hospital, checkout time is 11:00 AM the day of  discharge.    Special Instructions: Shower using CHG 2 nights before surgery and the night before surgery.  If you shower the day of surgery use CHG.  Use special wash - you have one bottle of CHG for all showers.  You should use approximately 1/3 of the bottle for each shower.   Please read over the following fact sheets that you were given: Pain Booklet, Coughing and Deep Breathing, Blood Transfusion Information, MRSA Information and Surgical Site Infection Prevention

## 2012-11-20 NOTE — Progress Notes (Signed)
Anesthesia PAT Evaluation:  Patient is a 79 year old male scheduled for right CEA on 11/25/12 by Dr. Darrick Penna.  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 Jude dual chamber PPM 08/01/09, ischemia cardiomyopathy, systolic CHF, HTN, hypothyroidism, GERD, arthritis, AAA open repair ~ '95, left IHR '06, renal stent '04.  PCP is Dr. Pearson Grippe.  Cardiologist is Dr. Sharrell Ku. There are multiple cardiology notes in Epic including communication with Adolph Pollack cardiologist by Dr. Darrick Penna. Cardiology had recommended waiting 4 weeks past his stent before undergoing carotid intervention.  Patient is now off Plavix.  He remains on ASA.  Dr. Evelina Dun has placed him on a Lovenox bridge while off Xarelto (for afib), and reports his last dose in on 11/23/12.   EKG on 09/28/12 showed afib with occasional v-paced complexes with occasional PVC's or aberrantly conducted complexes.    I evaluated him during his PAT visit.  He denies any significant residual effects from his CVA.  He denies chest pain, SOB at rest, or significant LE edema.  He was able to walk from entrance A to Short Stay without any significant DOE.  He feels his breathing is stable. (During CHF exacerbation he couldn't even walk from room to room without being dyspneic). Exam shows a pleasant Caucasian male in NAD.  No conversational dyspnea noted.  He initially didn't feel as well after his PPM settings were adjusted, but says he is now feeling better.  Heart rhythm actually sounds regular, rate 60 bpm. Lungs diminished at bases, otherwise clear.  Trace pre-tibial edema. PPM is on his left chest.  TEE on 09/23/12 showed: - Left ventricle: Systolic function was severely reduced. The estimated ejection fraction was in the range of 20% to 25%. Diffuse hypokinesis. - Aortic valve: Cusp separation was reduced. Mild  regurgitation. - Mitral valve: Moderate regurgitation. - Left atrium: The atrium was moderately dilated. No evidence of thrombus in the atrial cavity or appendage. There was spontaneous echo contrast ("smoke"). - Right ventricle: The cavity size was moderately dilated. Systolic function was severely reduced. - Right atrium: The atrium was moderately dilated. - Atrial septum: Doppler showed no right-to-left atrial level shunt. - Tricuspid valve: No evidence of vegetation. Moderate regurgitation. - Pulmonic valve: No evidence of vegetation.  Cardiac cath on 09/15/12 showed: 1. Severe two-vessel obstructive coronary disease. 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 (distal LM 20%, proximal LAD up to 30%, proximal RI 90-95%, proximal OM1 60-70%, proximal RCA occlusion with right to right and left to right collaterals). 2. Severe left ventricular dysfunction.  3. Normal right heart pressures. Left ventricular filling pressures are satisfactory.  4. Successful stenting of the ramus intermediate branch with a bare-metal stent.   Recent imaging CT head showed no bleed, some atrophy. CTA neck chest shaggy arch and descending thoracic aorta, 80% right ICA but calcified and may be overestimated but probably at least 50-70%. Left ICA 50%. Carotid duplex on 11/13/12 repeated and showed a 40-60% right internal carotid artery stenosis but probably underestimated due to severe calcification. Left carotid 40%. Vertebral arteries patent antegrade bilaterally.   CXR on 09/29/12 showed: 1. Cardiomegaly with decreasing pleural effusions. 2. Improved aeration at both lung bases.  Preoperative labs noted.  Patient does not appear to have any acute CHF/CV symptoms.  His rhythm sounds regular today.  He is s/p > 4 weeks following his BMS.  If no  acute changes then I would anticipate that he could proceed as planned.  He will need careful attention to fluid administration with his severe LV dysfunction.  Velna Ochs [facility] Short Stay Center/Anesthesiology Phone [phone] 11/20/2012 5:44 PM

## 2012-11-24 MED ORDER — DEXTROSE 5 % IV SOLN
1.5000 g | INTRAVENOUS | Status: AC
  Administered 2012-11-25: 1.5 g via INTRAVENOUS
  Filled 2012-11-24 (×2): qty 1.5

## 2012-11-24 NOTE — Progress Notes (Signed)
I left a message on voice mail for new arrival time of 0630.

## 2012-11-25 ENCOUNTER — Encounter (HOSPITAL_COMMUNITY): Payer: Self-pay | Admitting: Vascular Surgery

## 2012-11-25 ENCOUNTER — Encounter (HOSPITAL_COMMUNITY)
Admission: RE | Disposition: A | Discharge status: Home / Self Care | Payer: Self-pay | Source: Ambulatory Visit | Attending: Vascular Surgery

## 2012-11-25 ENCOUNTER — Encounter (HOSPITAL_COMMUNITY): Payer: Self-pay | Admitting: Surgery

## 2012-11-25 ENCOUNTER — Telehealth: Payer: Self-pay | Admitting: Vascular Surgery

## 2012-11-25 ENCOUNTER — Ambulatory Visit (HOSPITAL_COMMUNITY): Payer: Medicare Other | Admitting: Anesthesiology

## 2012-11-25 ENCOUNTER — Inpatient Hospital Stay (HOSPITAL_COMMUNITY)
Admission: RE | Admit: 2012-11-25 | Discharge: 2012-11-26 | DRG: 038 | Disposition: A | Discharge status: Home / Self Care | Payer: Medicare Other | Source: Ambulatory Visit | Attending: Vascular Surgery | Admitting: Vascular Surgery

## 2012-11-25 DIAGNOSIS — I6322 Cerebral infarction due to unspecified occlusion or stenosis of basilar arteries: Secondary | ICD-10-CM

## 2012-11-25 DIAGNOSIS — I739 Peripheral vascular disease, unspecified: Secondary | ICD-10-CM | POA: Diagnosis present

## 2012-11-25 DIAGNOSIS — Z7901 Long term (current) use of anticoagulants: Secondary | ICD-10-CM

## 2012-11-25 DIAGNOSIS — I4891 Unspecified atrial fibrillation: Secondary | ICD-10-CM | POA: Diagnosis present

## 2012-11-25 DIAGNOSIS — I6529 Occlusion and stenosis of unspecified carotid artery: Principal | ICD-10-CM | POA: Diagnosis present

## 2012-11-25 DIAGNOSIS — E785 Hyperlipidemia, unspecified: Secondary | ICD-10-CM | POA: Diagnosis present

## 2012-11-25 DIAGNOSIS — Z8673 Personal history of transient ischemic attack (TIA), and cerebral infarction without residual deficits: Secondary | ICD-10-CM

## 2012-11-25 DIAGNOSIS — I10 Essential (primary) hypertension: Secondary | ICD-10-CM | POA: Diagnosis present

## 2012-11-25 DIAGNOSIS — Z7982 Long term (current) use of aspirin: Secondary | ICD-10-CM

## 2012-11-25 DIAGNOSIS — I252 Old myocardial infarction: Secondary | ICD-10-CM

## 2012-11-25 DIAGNOSIS — I5022 Chronic systolic (congestive) heart failure: Secondary | ICD-10-CM | POA: Diagnosis present

## 2012-11-25 DIAGNOSIS — Z950 Presence of cardiac pacemaker: Secondary | ICD-10-CM

## 2012-11-25 DIAGNOSIS — E039 Hypothyroidism, unspecified: Secondary | ICD-10-CM | POA: Diagnosis present

## 2012-11-25 DIAGNOSIS — I509 Heart failure, unspecified: Secondary | ICD-10-CM | POA: Diagnosis present

## 2012-11-25 DIAGNOSIS — I428 Other cardiomyopathies: Secondary | ICD-10-CM | POA: Diagnosis present

## 2012-11-25 DIAGNOSIS — Z79899 Other long term (current) drug therapy: Secondary | ICD-10-CM

## 2012-11-25 DIAGNOSIS — I2510 Atherosclerotic heart disease of native coronary artery without angina pectoris: Secondary | ICD-10-CM | POA: Diagnosis present

## 2012-11-25 DIAGNOSIS — Z87891 Personal history of nicotine dependence: Secondary | ICD-10-CM

## 2012-11-25 DIAGNOSIS — K219 Gastro-esophageal reflux disease without esophagitis: Secondary | ICD-10-CM | POA: Diagnosis present

## 2012-11-25 HISTORY — PX: ENDARTERECTOMY: SHX5162

## 2012-11-25 HISTORY — PX: CAROTID ENDARTERECTOMY: SUR193

## 2012-11-25 SURGERY — ENDARTERECTOMY, CAROTID
Anesthesia: General | Site: Neck | Laterality: Right | Wound class: Clean

## 2012-11-25 MED ORDER — 0.9 % SODIUM CHLORIDE (POUR BTL) OPTIME
TOPICAL | Status: DC | PRN
  Administered 2012-11-25 (×2): 1000 mL

## 2012-11-25 MED ORDER — ONDANSETRON HCL 4 MG/2ML IJ SOLN
INTRAMUSCULAR | Status: DC | PRN
  Administered 2012-11-25: 4 mg via INTRAVENOUS

## 2012-11-25 MED ORDER — ASPIRIN EC 81 MG PO TBEC
81.0000 mg | DELAYED_RELEASE_TABLET | Freq: Every day | ORAL | Status: DC
  Administered 2012-11-26: 81 mg via ORAL
  Filled 2012-11-25: qty 1

## 2012-11-25 MED ORDER — METOPROLOL TARTRATE 1 MG/ML IV SOLN: 2.0000 mg | INTRAVENOUS | Status: DC | PRN

## 2012-11-25 MED ORDER — PHENYLEPHRINE HCL 10 MG/ML IJ SOLN
INTRAMUSCULAR | Status: DC | PRN
  Administered 2012-11-25 (×3): 40 ug via INTRAVENOUS
  Administered 2012-11-25: 80 ug via INTRAVENOUS
  Administered 2012-11-25: 40 ug via INTRAVENOUS
  Administered 2012-11-25 (×2): 80 ug via INTRAVENOUS
  Administered 2012-11-25 (×3): 40 ug via INTRAVENOUS

## 2012-11-25 MED ORDER — DEXTROSE-NACL 5-0.45 % IV SOLN
INTRAVENOUS | Status: DC
  Administered 2012-11-25: 18:00:00 via INTRAVENOUS

## 2012-11-25 MED ORDER — HEPARIN SODIUM (PORCINE) 1000 UNIT/ML IJ SOLN
INTRAMUSCULAR | Status: DC | PRN
  Administered 2012-11-25: 10000 [IU] via INTRAVENOUS

## 2012-11-25 MED ORDER — ACETAMINOPHEN 325 MG PO TABS: 325.0000 mg | ORAL_TABLET | ORAL | Status: DC | PRN

## 2012-11-25 MED ORDER — TRAMADOL HCL 50 MG PO TABS: 50.0000 mg | ORAL_TABLET | Freq: Four times a day (QID) | ORAL | Status: DC | PRN

## 2012-11-25 MED ORDER — EPHEDRINE SULFATE 50 MG/ML IJ SOLN
INTRAMUSCULAR | Status: DC | PRN
  Administered 2012-11-25 (×3): 10 mg via INTRAVENOUS

## 2012-11-25 MED ORDER — LACTATED RINGERS IV SOLN
INTRAVENOUS | Status: DC
  Administered 2012-11-25: 08:00:00 via INTRAVENOUS

## 2012-11-25 MED ORDER — AMIODARONE HCL 200 MG PO TABS
200.0000 mg | ORAL_TABLET | Freq: Every day | ORAL | Status: DC
  Administered 2012-11-26: 200 mg via ORAL
  Filled 2012-11-25: qty 1

## 2012-11-25 MED ORDER — ONDANSETRON HCL 4 MG/2ML IJ SOLN: 4.0000 mg | Freq: Four times a day (QID) | INTRAMUSCULAR | Status: DC | PRN

## 2012-11-25 MED ORDER — METOPROLOL TARTRATE 50 MG PO TABS
50.0000 mg | ORAL_TABLET | Freq: Two times a day (BID) | ORAL | Status: DC
  Administered 2012-11-26: 50 mg via ORAL
  Filled 2012-11-25 (×3): qty 1

## 2012-11-25 MED ORDER — NITROGLYCERIN 0.4 MG SL SUBL: 0.4000 mg | SUBLINGUAL_TABLET | SUBLINGUAL | Status: DC | PRN

## 2012-11-25 MED ORDER — THROMBIN 20000 UNITS EX SOLR
CUTANEOUS | Status: AC
  Filled 2012-11-25: qty 20000

## 2012-11-25 MED ORDER — HYDROMORPHONE HCL PF 1 MG/ML IJ SOLN: 0.2500 mg | INTRAMUSCULAR | Status: DC | PRN

## 2012-11-25 MED ORDER — ACETAMINOPHEN 650 MG RE SUPP: 325.0000 mg | RECTAL | Status: DC | PRN

## 2012-11-25 MED ORDER — DEXTROSE 5 % IV SOLN
1.5000 g | Freq: Two times a day (BID) | INTRAVENOUS | Status: AC
  Administered 2012-11-25 – 2012-11-26 (×2): 1.5 g via INTRAVENOUS
  Filled 2012-11-25 (×3): qty 1.5

## 2012-11-25 MED ORDER — PROTAMINE SULFATE 10 MG/ML IV SOLN
INTRAVENOUS | Status: DC | PRN
  Administered 2012-11-25: 50 mg via INTRAVENOUS

## 2012-11-25 MED ORDER — LEVOTHYROXINE SODIUM 112 MCG PO TABS
112.0000 ug | ORAL_TABLET | Freq: Every day | ORAL | Status: DC
  Administered 2012-11-26: 112 ug via ORAL
  Filled 2012-11-25 (×2): qty 1

## 2012-11-25 MED ORDER — FUROSEMIDE 80 MG PO TABS
80.0000 mg | ORAL_TABLET | Freq: Every day | ORAL | Status: DC
  Administered 2012-11-26: 80 mg via ORAL
  Filled 2012-11-25 (×2): qty 1

## 2012-11-25 MED ORDER — LABETALOL HCL 5 MG/ML IV SOLN: 10.0000 mg | INTRAVENOUS | Status: DC | PRN

## 2012-11-25 MED ORDER — ATORVASTATIN CALCIUM 20 MG PO TABS
20.0000 mg | ORAL_TABLET | Freq: Every day | ORAL | Status: DC
  Administered 2012-11-26: 20 mg via ORAL
  Filled 2012-11-25: qty 1

## 2012-11-25 MED ORDER — ALUM & MAG HYDROXIDE-SIMETH 200-200-20 MG/5ML PO SUSP: 15.0000 mL | ORAL | Status: DC | PRN

## 2012-11-25 MED ORDER — FENTANYL CITRATE 0.05 MG/ML IJ SOLN
INTRAMUSCULAR | Status: DC | PRN
  Administered 2012-11-25: 100 ug via INTRAVENOUS

## 2012-11-25 MED ORDER — INFLUENZA VAC SPLIT QUAD 0.5 ML IM SUSP
0.5000 mL | INTRAMUSCULAR | Status: AC
  Administered 2012-11-26: 0.5 mL via INTRAMUSCULAR
  Filled 2012-11-25 (×2): qty 0.5

## 2012-11-25 MED ORDER — GLYCOPYRROLATE 0.2 MG/ML IJ SOLN
INTRAMUSCULAR | Status: DC | PRN
  Administered 2012-11-25: 0.4 mg via INTRAVENOUS

## 2012-11-25 MED ORDER — SODIUM CHLORIDE 0.9 % IV SOLN: 500.0000 mL | Freq: Once | INTRAVENOUS | Status: AC | PRN

## 2012-11-25 MED ORDER — RIVAROXABAN 20 MG PO TABS: 20.0000 mg | ORAL_TABLET | Freq: Every evening | ORAL | Status: DC

## 2012-11-25 MED ORDER — OXYCODONE-ACETAMINOPHEN 5-325 MG PO TABS: 1.0000 | ORAL_TABLET | ORAL | Status: DC | PRN

## 2012-11-25 MED ORDER — DOCUSATE SODIUM 100 MG PO CAPS
100.0000 mg | ORAL_CAPSULE | Freq: Every day | ORAL | Status: DC
  Administered 2012-11-26: 100 mg via ORAL
  Filled 2012-11-25: qty 1

## 2012-11-25 MED ORDER — HYDROMORPHONE HCL PF 1 MG/ML IJ SOLN: 0.5000 mg | INTRAMUSCULAR | Status: DC | PRN

## 2012-11-25 MED ORDER — ONDANSETRON HCL 4 MG/2ML IJ SOLN: 4.0000 mg | Freq: Once | INTRAMUSCULAR | Status: DC | PRN

## 2012-11-25 MED ORDER — SODIUM CHLORIDE 0.9 % IV SOLN: INTRAVENOUS | Status: DC

## 2012-11-25 MED ORDER — LACTATED RINGERS IV SOLN
INTRAVENOUS | Status: DC | PRN
  Administered 2012-11-25: 09:00:00 via INTRAVENOUS

## 2012-11-25 MED ORDER — SODIUM CHLORIDE 0.9 % IR SOLN
Status: DC | PRN
  Administered 2012-11-25: 11:00:00

## 2012-11-25 MED ORDER — PHENOL 1.4 % MT LIQD: 1.0000 | OROMUCOSAL | Status: DC | PRN

## 2012-11-25 MED ORDER — PANTOPRAZOLE SODIUM 40 MG PO TBEC
40.0000 mg | DELAYED_RELEASE_TABLET | Freq: Every day | ORAL | Status: DC
  Administered 2012-11-26: 40 mg via ORAL
  Filled 2012-11-25: qty 1

## 2012-11-25 MED ORDER — HYDRALAZINE HCL 20 MG/ML IJ SOLN: 10.0000 mg | INTRAMUSCULAR | Status: DC | PRN

## 2012-11-25 MED ORDER — ROCURONIUM BROMIDE 100 MG/10ML IV SOLN
INTRAVENOUS | Status: DC | PRN
  Administered 2012-11-25: 10 mg via INTRAVENOUS

## 2012-11-25 MED ORDER — POTASSIUM CHLORIDE CRYS ER 20 MEQ PO TBCR: 20.0000 meq | EXTENDED_RELEASE_TABLET | Freq: Once | ORAL | Status: AC | PRN

## 2012-11-25 MED ORDER — GUAIFENESIN-DM 100-10 MG/5ML PO SYRP: 15.0000 mL | ORAL_SOLUTION | ORAL | Status: DC | PRN

## 2012-11-25 MED ORDER — NEOSTIGMINE METHYLSULFATE 1 MG/ML IJ SOLN
INTRAMUSCULAR | Status: DC | PRN
  Administered 2012-11-25: 3 mg via INTRAVENOUS

## 2012-11-25 SURGICAL SUPPLY — 53 items
ADH SKN CLS APL DERMABOND .7 (GAUZE/BANDAGES/DRESSINGS) ×1
CANISTER SUCTION 2500CC (MISCELLANEOUS) ×2 IMPLANT
CANNULA VESSEL W/WING WO/VALVE (CANNULA) ×2 IMPLANT
CATH ROBINSON RED A/P 18FR (CATHETERS) ×2 IMPLANT
CLIP TI MEDIUM 24 (CLIP) ×2 IMPLANT
CLIP TI WIDE RED SMALL 24 (CLIP) ×2 IMPLANT
CLOTH BEACON ORANGE TIMEOUT ST (SAFETY) ×2 IMPLANT
COVER SURGICAL LIGHT HANDLE (MISCELLANEOUS) ×2 IMPLANT
CRADLE DONUT ADULT HEAD (MISCELLANEOUS) ×2 IMPLANT
DECANTER SPIKE VIAL GLASS SM (MISCELLANEOUS) IMPLANT
DERMABOND ADVANCED (GAUZE/BANDAGES/DRESSINGS) ×1
DERMABOND ADVANCED .7 DNX12 (GAUZE/BANDAGES/DRESSINGS) ×1 IMPLANT
DRAIN HEMOVAC 1/8 X 5 (WOUND CARE) IMPLANT
DRAPE WARM FLUID 44X44 (DRAPE) ×2 IMPLANT
ELECT REM PT RETURN 9FT ADLT (ELECTROSURGICAL) ×2
ELECTRODE REM PT RTRN 9FT ADLT (ELECTROSURGICAL) ×1 IMPLANT
EVACUATOR SILICONE 100CC (DRAIN) IMPLANT
GEL ULTRASOUND 20GR AQUASONIC (MISCELLANEOUS) IMPLANT
GLOVE BIO SURGEON STRL SZ7.5 (GLOVE) ×2 IMPLANT
GLOVE BIOGEL PI IND STRL 6.5 (GLOVE) IMPLANT
GLOVE BIOGEL PI IND STRL 7.0 (GLOVE) IMPLANT
GLOVE BIOGEL PI INDICATOR 6.5 (GLOVE) ×1
GLOVE BIOGEL PI INDICATOR 7.0 (GLOVE) ×1
GLOVE SS BIOGEL STRL SZ 6.5 (GLOVE) IMPLANT
GLOVE SUPERSENSE BIOGEL SZ 6.5 (GLOVE) ×1
GLOVE SURG SS PI 7.0 STRL IVOR (GLOVE) ×1 IMPLANT
GOWN PREVENTION PLUS LG XLONG (DISPOSABLE) ×1 IMPLANT
GOWN PREVENTION PLUS XLARGE (GOWN DISPOSABLE) ×2 IMPLANT
GOWN STRL NON-REIN LRG LVL3 (GOWN DISPOSABLE) ×4 IMPLANT
KIT BASIN OR (CUSTOM PROCEDURE TRAY) ×2 IMPLANT
KIT ROOM TURNOVER OR (KITS) ×2 IMPLANT
LOOP VESSEL MINI RED (MISCELLANEOUS) IMPLANT
NDL HYPO 25GX1X1/2 BEV (NEEDLE) IMPLANT
NEEDLE HYPO 25GX1X1/2 BEV (NEEDLE) IMPLANT
NS IRRIG 1000ML POUR BTL (IV SOLUTION) ×4 IMPLANT
PACK CAROTID (CUSTOM PROCEDURE TRAY) ×2 IMPLANT
PAD ARMBOARD 7.5X6 YLW CONV (MISCELLANEOUS) ×4 IMPLANT
PATCH HEMASHIELD 8X75 (Vascular Products) ×1 IMPLANT
SHUNT CAROTID BYPASS 10 (VASCULAR PRODUCTS) ×1 IMPLANT
SHUNT CAROTID BYPASS 12FRX15.5 (VASCULAR PRODUCTS) IMPLANT
SPONGE SURGIFOAM ABS GEL 100 (HEMOSTASIS) IMPLANT
SUT ETHILON 3 0 PS 1 (SUTURE) IMPLANT
SUT PROLENE 6 0 CC (SUTURE) ×4 IMPLANT
SUT PROLENE 7 0 BV 1 (SUTURE) IMPLANT
SUT SILK 3 0 TIES 17X18 (SUTURE)
SUT SILK 3-0 18XBRD TIE BLK (SUTURE) IMPLANT
SUT VIC AB 3-0 SH 27 (SUTURE) ×2
SUT VIC AB 3-0 SH 27X BRD (SUTURE) ×1 IMPLANT
SUT VICRYL 4-0 PS2 18IN ABS (SUTURE) ×2 IMPLANT
SYR CONTROL 10ML LL (SYRINGE) IMPLANT
TOWEL OR 17X24 6PK STRL BLUE (TOWEL DISPOSABLE) ×3 IMPLANT
TOWEL OR 17X26 10 PK STRL BLUE (TOWEL DISPOSABLE) ×2 IMPLANT
WATER STERILE IRR 1000ML POUR (IV SOLUTION) ×2 IMPLANT

## 2012-11-25 NOTE — Progress Notes (Signed)
Utilization review completed.  

## 2012-11-25 NOTE — Anesthesia Postprocedure Evaluation (Signed)
  Anesthesia Post-op Note  Patient: Trevor Simpson  Procedure(s) Performed: Procedure(s): ENDARTERECTOMY CAROTID With Dacron Patch Angioplasty (Right)  Patient Location: PACU  Anesthesia Type:General  Level of Consciousness: awake, alert  and oriented  Airway and Oxygen Therapy: Patient Spontanous Breathing and Patient connected to nasal cannula oxygen  Post-op Pain: mild  Post-op Assessment: Post-op Vital signs reviewed, Patient's Cardiovascular Status Stable, Respiratory Function Stable, Patent Airway and Pain level controlled  Post-op Vital Signs: stable  Complications: No apparent anesthesia complications

## 2012-11-25 NOTE — Op Note (Signed)
Procedure Right carotid endarterectomy  Preoperative diagnosis: High-grade symptomatic right internal carotid artery stenosis  Postoperative diagnosis: Same  Anesthesia General  Asst.: Della Goo, [facility]  Operative findings: #1 greater than 80% right internal carotid stenosis                                                             #2 Dacron patch           #3 10 Fr shunt  Operative details: After obtaining informed consent, the patient was taken to the operating room. The patient was placed in a supine position on the operating room table. After induction of general anesthesia and endotracheal intubation a Foley catheter was placed. Next the patient's entire neck and chest was prepped and draped in the usual sterile fashion. An oblique incision was made on the right aspect of the patient's neck anterior to the border the right sternocleidomastoid muscle. The patients neck was very stiff which made extension and rotation of the neck difficult.  This made the carotid deeper and more rotated.  The incision was carried into the subcutaneous tissues and through the platysma. The sternocleidomastoid muscle was identified and reflected laterally. The common carotid artery was then found at the base of the incision this was dissected free circumferentially. It was fairly soft on palpation.  The vagus nerve was identified and protected. Dissection was then carried up to the level carotid bifurcation.   The bifurcation seemed high but this may have been due to the stiff neck.  Regardless, to get above the stenosis I was required to divide the ansa cervicalis near its origin with the hypoglossal nerve as well as dividing the occipital branch of the external carotid artery.  The hyperglossal nerve was skeletonized circumferentially to adequately mobilize it. The internal carotid artery was dissected free circumferentially at the level of the hypoglossal nerve and it was soft in character at this location and  above any palpable disease. A vessel loop was placed around this. Next the external carotid and superior thyroid arteries were dissected free circumferentially and vessel loops were placed around these. The patient was given 10000 units of intravenous heparin.  After 2 minutes of circulation time and raising the mean arterial pressure to 85 mm mercury, the distal internal carotid artery was controlled with small bulldog clamp. The external carotid and superior thyroid arteries were controlled with vessel loops. The common carotid artery was controlled with a peripheral DeBakey clamp. A longitudinal opening was made in the common carotid artery just below the bifurcation. The arteriotomy was extended distally up into the internal carotid with Potts scissors. There was a large calcified plaque approaching 80% stenosis in the internal carotid and it was irregular and jagged.   An 10 Fr shunt was brought onto the field.  This was threaded into the distal internal carotid artery and allowed to backbleed thoroughly.  There was pulsatile backbleeding.  This was then threaded into the common carotid and secured with a Rummel tourniquet.   There was no air in the shunt at this point and flow was restored to the brain.  Attention was then turned to the common carotid artery once again. A suitable endarterectomy plane was obtained and endarterectomy was begun in the common carotid artery and a good proximal endpoint was  obtained. An eversion endarterectomy was performed on the external carotid artery and a good endpoint was obtained. The plaque was then elevated in the internal carotid artery and a nice feathered distal endpoint was also obtained.  The plaque was passed off the table. All loose debris was then removed from the carotid bed and everything was thoroughly irrigated with heparinized saline. A Dacron patch was then brought on to the operative field and this was sewn on as a patch angioplasty using a running 6-0  Prolene suture. Prior to completion of the anastomosis the internal carotid artery was thoroughly backbled. This was then controlled again with a fine bulldog clamp.  The common carotid was thoroughly flushed forward. The external carotid was also thoroughly backbled.  The remainder of the patch was completed and the anastomosis was secured. Flow was then restored first retrograde from the external carotid into the carotid bed then antegrade from the common carotid to the external carotid artery and after approximately 5 cardiac cycles to the internal carotid artery. Doppler was used to evaluate the external/internal and common carotid arteries and these all had good Doppler flow. Hemostasis was obtained with 1 additional repair suture. The patient was also given 50 mg of Protamine.      The platysma muscle was reapproximated using a running 3-0 Vicryl suture. The skin was closed with 4 0 Vicryl subcuticular stitch.  The patient was awakened in the operating room and was moving upper and lower extremities symmetrically and following commands.  The patient was stable on arrival to the PACU.  Fabienne Bruns, MD [facility] Office: [phone] Pager: [phone]

## 2012-11-25 NOTE — Progress Notes (Signed)
Patient having hard time voiding. Bladder scanner showed greater than 850cc. I &O cath and received 950cc. No complications. Will continue to monitor.

## 2012-11-25 NOTE — Anesthesia Postprocedure Evaluation (Signed)
  Anesthesia Post-op Note  Patient: Trevor Simpson  Procedure(s) Performed: Procedure(s): ENDARTERECTOMY CAROTID With Dacron Patch Angioplasty (Right)  Patient Location: PACU  Anesthesia Type:General  Level of Consciousness: awake, alert  and oriented  Airway and Oxygen Therapy: Patient Spontanous Breathing and Patient connected to nasal cannula oxygen  Post-op Pain: mild  Post-op Assessment: Post-op Vital signs reviewed  Post-op Vital Signs: Reviewed and stable  Complications: No apparent anesthesia complications

## 2012-11-25 NOTE — H&P (View-Only) (Signed)
History of Present Illness: Patient is a 79 y.o. year old male who presents for evaluation of carotid stenosis. Was previously seen in the hospital after a stroke but also had several exacerbations of congestive heart failure recently. Symptoms related to this stenosis include recent right brain stroke with left face arm and leg weakness. This is now essentially resolved except for some occasional speech problems .The patient also has history of atrial fibrillation and is on Xarelto and aspirin. The patient is currently on antiplatelet therapy. The carotid stenosis was found on screening duplex and has been followed by [facility] Cardiology but now seems to have progressed. Other medical problems include CHF with multiple admissions but none in the last month EF 25%. CAD with recent cath by Dr [place] RCA occluded and 2 vessel disease otherwise.  He also has history of hypertension and has a pacemaker for bradycardia. His primary cardiologist is Dr Ladona Ridgel. The patient denies prior stroke or TIA before this. Currently on Plavix and statin.    Past Medical History   Diagnosis  Date   .  Coronary artery disease    .  Other and unspecified hyperlipidemia    .  Hypertension    .  Atrial fibrillation    .  Sinoatrial node dysfunction    .  PVD (peripheral vascular disease)      s/p renal artery stent 20-04   .  Pacemaker st Jude    .  AAA (abdominal aortic aneurysm)      repaired   .  Systolic congestive heart failure    .  Cardiomyopathy     Past Surgical History   Procedure  Laterality  Date   .  Inguinal hernia repair       left   .  Cardiac pacemaker placement   08/01/2009     st jude dual chamber   .  Colonoscopy     .  Esophagogastroduodenoscopy     .  Translumminal angioplasty     .  Tee without cardioversion  N/A  09/23/2012     Procedure: TRANSESOPHAGEAL ECHOCARDIOGRAM (TEE); Surgeon: Lewayne Bunting, MD; Location: [facility] ENDOSCOPY; Service: Cardiovascular; Laterality: N/A;   .  Cardioversion   N/A  09/23/2012     Procedure: CARDIOVERSION; Surgeon: Lewayne Bunting, MD; Location: [facility] ENDOSCOPY; Service: Cardiovascular; Laterality: N/A;   Social History  History   Substance Use Topics   .  Smoking status:  Former Smoker     Quit date:  03/19/1993   .  Smokeless tobacco:  Former Neurosurgeon     Types:  Chew   .  Alcohol Use:  No   Family History  Family History   Problem  Relation  Age of Onset   .  CAD  Father    .  CAD  Brother    .  Hypertension  Father    .  Hypertension  Brother    .  Diabetes  Brother    .  Lung cancer  Sister    Allergies  Allergies   Allergen  Reactions   .  Codeine      "tripped out on it"   .  Morphine      "tripped out on it"     Current Outpatient Prescriptions on File Prior to Visit  Medication Sig Dispense Refill  . ALPRAZolam (XANAX) 0.25 MG tablet Take 0.25 mg by mouth at bedtime as needed.       Marland Kitchen amiodarone (PACERONE) 200 MG  tablet Take 1 tablet (200 mg total) by mouth daily.      Marland Kitchen aspirin EC 81 MG tablet Take 1 tablet (81 mg total) by mouth daily.  90 tablet  3  . atorvastatin (LIPITOR) 20 MG tablet Take 20 mg by mouth daily.        . B Complex-C (SUPER B COMPLEX) TABS Take 1 tablet by mouth daily.       . calcium carbonate (OS-CAL) 600 MG TABS Take 600 mg by mouth 2 (two) times daily with a meal.        . cetirizine (ZYRTEC) 10 MG tablet Take 10 mg by mouth daily.       . fish oil-omega-3 fatty acids 1000 MG capsule Take 1 g by mouth 2 (two) times daily.       . furosemide (LASIX) 80 MG tablet Take 1 tablet (80 mg total) by mouth daily.  90 tablet  0  . levothyroxine (SYNTHROID, LEVOTHROID) 112 MCG tablet Take 112 mcg by mouth daily.      . magnesium gluconate (MAGONATE) 500 MG tablet Take 500 mg by mouth daily.       . metoprolol (LOPRESSOR) 50 MG tablet Take 1 tablet (50 mg total) by mouth 2 (two) times daily.  60 tablet  11  . Multiple Vitamin (MULTIVITAMIN WITH MINERALS) TABS Take 1 tablet by mouth daily.      . nitroGLYCERIN  (NITROSTAT) 0.4 MG SL tablet Place 1 tablet (0.4 mg total) under the tongue every 5 (five) minutes as needed for chest pain.  25 tablet  3  . omeprazole (PRILOSEC) 20 MG capsule Take 20 mg by mouth daily as needed (indigestion).       . Rivaroxaban (XARELTO) 20 MG TABS Take 20 mg by mouth every evening.      . Saw Palmetto, Serenoa repens, (SAW PALMETTO PO) Take 1 capsule by mouth 2 (two) times daily.      . vitamin C (ASCORBIC ACID) 500 MG tablet Take 500 mg by mouth daily.        . [DISCONTINUED] diltiazem (CARDIZEM CD) 120 MG 24 hr capsule       . [DISCONTINUED] warfarin (COUMADIN) 4 MG tablet Take 4 mg by mouth daily.         No current facility-administered medications on file prior to visit.   ROS:  General: No weight loss, Fever, chills  HEENT: No recent headaches, no nasal bleeding, no visual changes, no sore throat  Neurologic: No dizziness, blackouts, seizures. + recent symptoms of stroke or mini- stroke. Persistent slurred speech, no temporary blindness.  Cardiac: No recent episodes of chest pain/pressure, + shortness of breath at rest. + shortness of breath with exertion. + history of atrial fibrillation or irregular heartbeat  Vascular: No history of rest pain in feet. No history of claudication. No history of non-healing ulcer, No history of DVT  Pulmonary: No home oxygen, no productive cough, no hemoptysis  Musculoskeletal: [x ] Arthritis, [ ]  Low back pain, [ ]  Joint pain  Hematologic:No history of hypercoagulable state. No history of easy bleeding. No history of anemia  Gastrointestinal: No hematochezia or melena, No gastroesophageal reflux, no trouble swallowing  Urinary: [ ]  chronic Kidney disease, [ ]  on HD - [ ]  MWF or [ ]  TTHS, [ ]  Burning with urination, [ ]  Frequent urination, [ ]  Difficulty urinating;  Skin: No rashes  Psychological: + history of anxiety, No history of depression    Physical Examination   Filed  Vitals:   11/13/12 1415 11/13/12 1420  BP: 146/69  144/67  Pulse: 60   Height: 6' (1.829 m)   Weight: 191 lb (86.637 kg)   SpO2: 98%    Body mass index is 26.49 kg/(m^2).  General: Alert and oriented, no acute distress, occasional slurred speech  HEENT: Normal  Neck: No JVD  Pulmonary: Few crackles in bases  Cardiac: Regular Rate and Rhythm  Gastrointestinal: Soft, non-tender, non-distended, no mass  Skin: No rash  Extremity Pulses: 2+ radial, brachial, femoral pulses bilaterally  Musculoskeletal: No deformity or edema  Neurologic: Upper and lower extremity motor 5/5 and symmetric, no obvious facial droop   DATA: Review of recent imaging CT head no bleed, some atrophy. CTA neck chest shaggy arch and desc thoracic aorta, 80% right ICA but calcified and may be overestimate but probably at least 50-70%. Left IcA 50%   Duplex ultrasound of the carotids was repeated today. I reviewed and interpreted this study. This showed a 40-60% right internal carotid artery stenosis but probably underestimated due to severe calcification. Left carotid 40%. Vertebral arteries patent antegrade bilaterally.  ASSESSMENT: Recent stroke atheroembolic most likely since he was on Xarelto for his afib. He CHF seems to be compensated at this point Carotid stenting is not a great option sent he has significant atheroma burden in his arch and severe calcification of the bifurcation which would certainly increase stroke risk. I believe his best option for this symptomatic carotid stenosis his right carotid endarterectomy. Risks benefits possible complications and procedure details including but not limited to bleeding infection stroke myocardial risk and cranial nerve injury were explained the patient and his family today. The understand agree to proceed.   PLAN: Right carotid endarterectomy 11/25/2012   Fabienne Bruns, MD  [facility]  Office: [phone]  Pager: [phone]

## 2012-11-25 NOTE — Preoperative (Signed)
Beta Blockers   Reason not to administer Beta Blockers:Not Applicable 

## 2012-11-25 NOTE — Progress Notes (Signed)
Pt admitted to 3300 from PACU. Oriented to unit and room. Safety discussed and call bell with in reach. VSS.   Azeneth Carbonell, RN  

## 2012-11-25 NOTE — Transfer of Care (Signed)
Immediate Anesthesia Transfer of Care Note  Patient: Trevor Simpson  Procedure(s) Performed: Procedure(s): ENDARTERECTOMY CAROTID With Dacron Patch Angioplasty (Right)  Patient Location: PACU  Anesthesia Type:General  Level of Consciousness: awake and alert   Airway & Oxygen Therapy: Patient Spontanous Breathing and Patient connected to nasal cannula oxygen  Post-op Assessment: Report given to PACU RN and Post -op Vital signs reviewed and stable  Post vital signs: Reviewed and stable  Complications: No apparent anesthesia complications

## 2012-11-25 NOTE — Telephone Encounter (Addendum)
Message copied by Rosalyn Charters on Tue Nov 25, 2012  2:01 PM ------      Message from: Marlowe Shores      Created: Tue Nov 25, 2012 12:26 PM       2-3 week F/U CEA - Fields ------  left message notifying patient of fu appt. with dr. Darrick Penna on 12-11-12 12:45 pm

## 2012-11-25 NOTE — Anesthesia Preprocedure Evaluation (Signed)
Anesthesia Evaluation  Patient identified by MRN, date of birth, ID band Patient awake    Reviewed: Allergy & Precautions, H&P , NPO status , Patient's Chart, lab work & pertinent test results  Airway Mallampati: II      Dental  (+) Poor Dentition   Pulmonary    + decreased breath sounds      Cardiovascular Rhythm:Irregular Rate:Normal     Neuro/Psych    GI/Hepatic   Endo/Other    Renal/GU      Musculoskeletal   Abdominal   Peds  Hematology   Anesthesia Other Findings   Reproductive/Obstetrics                           Anesthesia Physical Anesthesia Plan  ASA: III  Anesthesia Plan: General   Post-op Pain Management:    Induction: Intravenous  Airway Management Planned: Oral ETT  Additional Equipment: Arterial line  Intra-op Plan:   Post-operative Plan:   Informed Consent: I have reviewed the patients History and Physical, chart, labs and discussed the procedure including the risks, benefits and alternatives for the proposed anesthesia with the patient or authorized representative who has indicated his/her understanding and acceptance.   Dental advisory given  Plan Discussed with: CRNA and Anesthesiologist  Anesthesia Plan Comments:         Anesthesia Quick Evaluation

## 2012-11-25 NOTE — Interval H&P Note (Signed)
History and Physical Interval Note:  11/25/2012 7:36 AM  Trevor Simpson  has presented today for surgery, with the diagnosis of RIGHT ICA STENOSIS  The various methods of treatment have been discussed with the patient and family. After consideration of risks, benefits and other options for treatment, the patient has consented to  Procedure(s): ENDARTERECTOMY CAROTID (Right) as a surgical intervention .  The patient's history has been reviewed, patient examined, no change in status, stable for surgery.  I have reviewed the patient's chart and labs.  Questions were answered to the patient's satisfaction.     FIELDS,CHARLES E

## 2012-11-25 NOTE — OR Nursing (Signed)
Care assumed from crna 

## 2012-11-26 LAB — BASIC METABOLIC PANEL
CO2: 24 mEq/L (ref 19–32)
Calcium: 7.3 mg/dL — ABNORMAL LOW (ref 8.4–10.5)
Creatinine, Ser: 0.87 mg/dL (ref 0.50–1.35)
GFR calc non Af Amer: 81 mL/min — ABNORMAL LOW (ref >90)
Glucose, Bld: 117 mg/dL — ABNORMAL HIGH (ref 70–99)

## 2012-11-26 LAB — CBC
Hemoglobin: 12.2 g/dL — ABNORMAL LOW (ref 13.0–17.0)
MCH: 31 pg (ref 26.0–34.0)
MCHC: 32.7 g/dL (ref 30.0–36.0)
MCV: 94.9 fL (ref 78.0–100.0)
RBC: 3.93 MIL/uL — ABNORMAL LOW (ref 4.22–5.81)

## 2012-11-26 MED ORDER — TAMSULOSIN HCL 0.4 MG PO CAPS
0.4000 mg | ORAL_CAPSULE | Freq: Every day | ORAL | Status: DC
  Administered 2012-11-26: 0.4 mg via ORAL
  Filled 2012-11-26: qty 1

## 2012-11-26 NOTE — Progress Notes (Signed)
Pt discharged to home with wife with belongings via w/c. Discharge instructions discussed and explained to pt and wife. Prescriptions, f/u appt., exit care notes on endarectomy explained and given to pt. No complaints of pain at this time.

## 2012-11-26 NOTE — Progress Notes (Addendum)
VASCULAR AND VEIN SURGERY POST - OP CEA PROGRESS NOTE  Date of Surgery: 11/25/2012  Surgeon(s): Sherren Kerns, MD 1 Day Post-Op right CEA .  HPI: Trevor Simpson is a 79 y.o. male who is 1 Day Post-Op . Patient is doing well. Patient denies headache; Patient denies difficulty swallowing; denies weakness in upper or lower extremities; Pt. denies other symptoms of stroke or TIA.  IMAGING: None   Significant Diagnostic Studies: CBC Lab Results  Component Value Date   WBC 6.6 11/26/2012   HGB 12.2* 11/26/2012   HCT 37.3* 11/26/2012   MCV 94.9 11/26/2012   PLT 112* 11/26/2012    BMET    Component Value Date/Time   NA 136 11/26/2012 0435   K 3.3* 11/26/2012 0435   CL 105 11/26/2012 0435   CO2 24 11/26/2012 0435   GLUCOSE 117* 11/26/2012 0435   BUN 11 11/26/2012 0435   CREATININE 0.87 11/26/2012 0435   CALCIUM 7.3* 11/26/2012 0435   GFRNONAA 81* 11/26/2012 0435   GFRAA >90 11/26/2012 0435    COAG Lab Results  Component Value Date   INR 1.26 11/20/2012   INR 2.67* 09/27/2012   INR 1.64* 09/15/2012   No results found for this basename: PTT      Intake/Output Summary (Last 24 hours) at 11/26/12 0753 Last data filed at 11/26/12 0630  Gross per 24 hour  Intake 2182.5 ml  Output   1950 ml  Net  232.5 ml    Physical Exam:  BP Readings from Last 3 Encounters:  11/26/12 126/54  11/26/12 126/54  11/20/12 136/72   Temp Readings from Last 3 Encounters:  11/26/12 98.6 F (37 C) Oral  11/26/12 98.6 F (37 C) Oral  11/20/12 97.9 F (36.6 C) Oral   SpO2 Readings from Last 3 Encounters:  11/26/12 92%  11/26/12 92%  11/20/12 96%   Pulse Readings from Last 3 Encounters:  11/26/12 60  11/26/12 60  11/20/12 60    Pt is A&O x 3 Gait is normal Speech is fluent right Neck Wound is healing well Patient with Negative tongue deviation and Negative facial droop Pt has good and equal strength in all extremities  Assessment/Plan:: Trevor Simpson is a 79 y.o. male is S/P  Right CEA Pt is voiding, ambulating and taking po well    Discharge to: Home Follow-up in 2 weeks   ROCZNIAK,REGINA J  11/26/2012 7:53 AM  Awake and alert No hematoma Neuro intact, tongue midline, motor 5/5 symmetric Restart xarelto this evening No Lovenox ASA daily D/c home  Fabienne Bruns, MD [facility] [place] Office: [phone] Pager: [phone]

## 2012-11-26 NOTE — Discharge Summary (Signed)
[facility] Discharge Summary   Patient ID:  Trevor Simpson MRN: [id number] DOB/AGE: 1933-10-24 79 y.o.  Admit date: 11/25/2012 Discharge date: 11/26/2012 Date of Surgery: 11/25/2012 Surgeon: Surgeon(s): Sherren Kerns, MD  Admission Diagnosis: RIGHT ICA STENOSIS  Discharge Diagnoses:  RIGHT ICA STENOSIS  Secondary Diagnoses: Past Medical History  Diagnosis Date  . Coronary artery disease   . Other and unspecified hyperlipidemia   . Hypertension   . Atrial fibrillation   . Sinoatrial node dysfunction   . PVD (peripheral vascular disease)     s/p renal artery stent 20-04  . Pacemaker st Jude   . AAA (abdominal aortic aneurysm)     repaired  . Systolic congestive heart failure   . Cardiomyopathy   . Carotid artery occlusion   . Atrial fibrillation   . Myocardial infarction   . Stroke     left sided affected no residual effects  . GERD (gastroesophageal reflux disease)   . Arthritis   . Hypothyroidism     Procedure(s): ENDARTERECTOMY CAROTID With Dacron Patch Angioplasty  Discharged Condition: good  HPI:  Trevor Simpson is a 79 y.o. male who presents for evaluation of carotid stenosis. Was previously seen in the hospital after a stroke but also had several exacerbations of congestive heart failure recently. Symptoms related to this stenosis include recent right brain stroke with left face arm and leg weakness. This is now essentially resolved except for some occasional speech problems .The patient also has history of atrial fibrillation and is on Xarelto and aspirin. The patient is currently on antiplatelet therapy. The carotid stenosis was found on screening duplex and has been followed by [facility] but now seems to have progressed. Other medical problems include CHF with multiple admissions but none in the last month EF 25%. CAD with recent cath by Dr [place] RCA occluded and 2 vessel disease otherwise. He also has history of hypertension and  has a pacemaker for bradycardia. His primary cardiologist is Dr Ladona Ridgel. The patient denies prior stroke or TIA before this. Currently on xarelto and statin.  DATA: Review of recent imaging CT head no bleed, some atrophy. CTA neck chest shaggy arch and desc thoracic aorta, 80% right ICA but calcified and may be overestimate but probably at least 50-70%. Left IcA 50%  Recent stroke atheroembolic most likely since he was on Xarelto for his afib. He CHF seems to be compensated at this point Carotid stenting is not a great option sent he has significant atheroma burden in his arch and severe calcification of the bifurcation which would certainly increase stroke risk. I believe his best option for this symptomatic carotid stenosis his right carotid endarterectomy  PLAN: Right carotid endarterectomy 11/25/2012     Hospital Course:  Trevor Simpson is a 79 y.o. male is S/P Right ENDARTERECTOMY CAROTID With Dacron Patch Angioplasty Extubated: POD # 0 Physical exam: Pt is A&O x 3 Gait is normal Speech is fluent Negative tongue deviation Negative facial droop Pt has good and equal strength in all extremities  Post-op wounds healing well Pt. Ambulating, voiding and taking PO diet without difficulty. Pt pain controlled with PO pain meds. Labs as below Complications:some urinary retention relieved with straight cath x 2. Pt voiding adequately POD#1  Consults:     Significant Diagnostic Studies: CBC Lab Results  Component Value Date   WBC 6.6 11/26/2012   HGB 12.2* 11/26/2012   HCT 37.3* 11/26/2012   MCV 94.9 11/26/2012   PLT 112*  11/26/2012    BMET    Component Value Date/Time   NA 136 11/26/2012 0435   K 3.3* 11/26/2012 0435   CL 105 11/26/2012 0435   CO2 24 11/26/2012 0435   GLUCOSE 117* 11/26/2012 0435   BUN 11 11/26/2012 0435   CREATININE 0.87 11/26/2012 0435   CALCIUM 7.3* 11/26/2012 0435   GFRNONAA 81* 11/26/2012 0435   GFRAA >90 11/26/2012 0435   COAG Lab Results  Component Value  Date   INR 1.26 11/20/2012   INR 2.67* 09/27/2012   INR 1.64* 09/15/2012     Disposition:  Discharge to :Home Discharge Orders   Future Appointments Provider Department Dept Phone   12/11/2012 12:45 PM Sherren Kerns, MD [facility] [phone]   01/28/2013 2:00 PM Ronal Fear, NP [facility] [phone]   02/16/2013 9:10 AM [employer] [facility] Main Office [place]) [phone]   Future Orders Complete By Expires   Call MD for:  redness, tenderness, or signs of infection (pain, swelling, bleeding, redness, odor or green/yellow discharge around incision site)  As directed    Call MD for:  severe or increased pain, loss or decreased feeling  in affected limb(s)  As directed    Call MD for:  temperature >100.5  As directed    CAROTID Sugery: Call MD for difficulty swallowing or speaking; weakness in arms or legs that is a new symtom; severe headache.  If you have increased swelling in the neck and/or  are having difficulty breathing, CALL 911  As directed    Discharge patient  As directed    Comments:     Discharge pt to home   Driving Restrictions  As directed    Comments:     No driving for 2 weeks   Increase activity slowly  As directed    Comments:     Walk with assistance use walker or cane as needed   Lifting restrictions  As directed    Comments:     No lifting for 4 weeks   May shower   As directed    Scheduling Instructions:     Thursday   may wash over wound with mild soap and water  As directed    No dressing needed  As directed    Resume previous diet  As directed        Medication List    STOP taking these medications       enoxaparin 40 MG/0.4ML injection  Commonly known as:  LOVENOX     enoxaparin 80 MG/0.8ML injection  Commonly known as:  LOVENOX      TAKE these medications       ALPRAZolam 0.25 MG tablet  Commonly known as:  XANAX  Take 0.25 mg by mouth 3 (three) times daily as  needed for sleep or anxiety.     amiodarone 200 MG tablet  Commonly known as:  PACERONE  Take 1 tablet (200 mg total) by mouth daily.     aspirin EC 81 MG tablet  Take 1 tablet (81 mg total) by mouth daily.     atorvastatin 20 MG tablet  Commonly known as:  LIPITOR  Take 20 mg by mouth daily.     calcium carbonate 600 MG Tabs tablet  Commonly known as:  OS-CAL  Take 600 mg by mouth 2 (two) times daily with a meal.     cetirizine 10 MG tablet  Commonly known as:  ZYRTEC  Take 10  mg by mouth daily.     fish oil-omega-3 fatty acids 1000 MG capsule  Take 1 g by mouth 2 (two) times daily.     furosemide 80 MG tablet  Commonly known as:  LASIX  Take 1 tablet (80 mg total) by mouth daily.     levothyroxine 112 MCG tablet  Commonly known as:  SYNTHROID, LEVOTHROID  Take 112 mcg by mouth daily.     magnesium gluconate 500 MG tablet  Commonly known as:  MAGONATE  Take 500 mg by mouth daily.     metoprolol 50 MG tablet  Commonly known as:  LOPRESSOR  Take 1 tablet (50 mg total) by mouth 2 (two) times daily.     multivitamin with minerals Tabs tablet  Take 1 tablet by mouth daily.     nitroGLYCERIN 0.4 MG SL tablet  Commonly known as:  NITROSTAT  Place 1 tablet (0.4 mg total) under the tongue every 5 (five) minutes as needed for chest pain.     omeprazole 20 MG capsule  Commonly known as:  PRILOSEC  Take 20 mg by mouth daily as needed (indigestion).     Rivaroxaban 20 MG Tabs tablet  Commonly known as:  XARELTO  Take 1 tablet (20 mg total) by mouth every evening.     SAW PALMETTO PO  Take 1 capsule by mouth 2 (two) times daily.     Super B Complex Tabs  Take 1 tablet by mouth daily.     traMADol 50 MG tablet  Commonly known as:  ULTRAM  Take 1 tablet (50 mg total) by mouth every 6 (six) hours as needed for pain.     vitamin C 500 MG tablet  Commonly known as:  ASCORBIC ACID  Take 500 mg by mouth daily.       Verbal and written Discharge instructions given to  the patient. Wound care per Discharge AVS     Follow-up Information   Follow up with Sherren Kerns, MD In 2 weeks. (office will arrange-sent)    Specialty:  Vascular Surgery   Contact information:   [address] [place] [address] [phone]       Signed: Marlowe Shores 11/26/2012, 7:59 AM  --- For VQI Registry use --- Instructions: Press F2 to tab through selections.  Delete question if not applicable.   Modified Rankin score at D/C (0-6): Rankin Score=0  IV medication needed for:  1. Hypertension: No 2. Hypotension: No  Post-op Complications: No  1. Post-op CVA or TIA: No 2. CN injury: No  3. Myocardial infarction: No  4.  CHF: No  5.  Dysrhythmia (new): No  6. Wound infection: No  7. Reperfusion symptoms: No  8. Return to OR: No Discharge medications: Statin use:  Yes ASA use:  Yes Beta blocker use:  Yes ACE-Inhibitor use: NO P2Y12 Antagonist use: [x ] None, [ ]  Plavix, [ ]  Plasugrel, [ ]  Ticlopinine, [ ]  Ticagrelor, [ ]  Other, [ ]  No for medical reason, [ ]  Non-compliant, [ ]  Not-indicated Anti-coagulant use:  [x ] None, [ ]  Warfarin, [ ]  Rivaroxaban, [ ]  Dabigatran, [ ]  Other, [ ]  No for medical reason, [ ]  Non-compliant, [ ]  Not-indicated

## 2012-11-28 ENCOUNTER — Encounter (HOSPITAL_COMMUNITY): Payer: Self-pay | Admitting: Vascular Surgery

## 2012-12-02 ENCOUNTER — Telehealth: Payer: Self-pay | Admitting: *Deleted

## 2012-12-02 NOTE — Telephone Encounter (Signed)
Trevor Simpson has been complaining since Sunday that he is having pain from the cap of his neck down his spine. He has not been able to turn his head. Pain is not relieved with his pain medicine. Wife has been putting arthritis creme on his neck and massaging it. The pain is getting worse daily since Sunday. The incision looks good,no reddness,drainage or warmth at site. I told her not to put anything on his neck or massage it since recent surgery. I told her since it has been getting steadily worse they needed to go to the ER; but she said he was stubborn and probably wouldn't go. I stressed needing to have this looked at and needing to go to the ER. I asked her to call back tomorrow if they didn't go and let me know how he is. She verbalized understanding.

## 2012-12-04 ENCOUNTER — Other Ambulatory Visit: Payer: Self-pay | Admitting: Vascular Surgery

## 2012-12-04 NOTE — Telephone Encounter (Signed)
Pt given prescription for Ultram 50 mg #40 disp for pain from his CEA recently  Fabienne Bruns, MD [facility] [place] Office: [phone] Pager: [phone]

## 2012-12-10 ENCOUNTER — Encounter: Payer: Self-pay | Admitting: Vascular Surgery

## 2012-12-11 ENCOUNTER — Ambulatory Visit (INDEPENDENT_AMBULATORY_CARE_PROVIDER_SITE_OTHER): Payer: Self-pay | Admitting: Vascular Surgery

## 2012-12-11 ENCOUNTER — Encounter: Payer: Self-pay | Admitting: Vascular Surgery

## 2012-12-11 DIAGNOSIS — Z48812 Encounter for surgical aftercare following surgery on the circulatory system: Secondary | ICD-10-CM

## 2012-12-11 DIAGNOSIS — I6529 Occlusion and stenosis of unspecified carotid artery: Secondary | ICD-10-CM

## 2012-12-11 NOTE — Progress Notes (Signed)
Patient is a 79 year old male who returns for followup today for right carotid endarterectomy on 11/25/2012. This was asymptomatic carotid stenosis with recent stroke. He denies any symptoms of TIA amaurosis or stroke. He had some pain in the posterior aspect of his neck postoperatively. He has known degenerative changes in his cervical spine. This is now resolved. His swallowing is normal. He has had no changes in his voice. He has no slurred speech. He is currently taking aspirin and Xarelto. Current Outpatient Prescriptions on File Prior to Visit  Medication Sig Dispense Refill  . ALPRAZolam (XANAX) 0.25 MG tablet Take 0.25 mg by mouth 3 (three) times daily as needed for sleep or anxiety.       Marland Kitchen amiodarone (PACERONE) 200 MG tablet Take 1 tablet (200 mg total) by mouth daily.      Marland Kitchen aspirin EC 81 MG tablet Take 1 tablet (81 mg total) by mouth daily.  90 tablet  3  . atorvastatin (LIPITOR) 20 MG tablet Take 20 mg by mouth daily.        . B Complex-C (SUPER B COMPLEX) TABS Take 1 tablet by mouth daily.       . calcium carbonate (OS-CAL) 600 MG TABS Take 600 mg by mouth 2 (two) times daily with a meal.        . cetirizine (ZYRTEC) 10 MG tablet Take 10 mg by mouth daily.       . fish oil-omega-3 fatty acids 1000 MG capsule Take 1 g by mouth 2 (two) times daily.       . furosemide (LASIX) 80 MG tablet Take 1 tablet (80 mg total) by mouth daily.  90 tablet  0  . levothyroxine (SYNTHROID, LEVOTHROID) 112 MCG tablet Take 112 mcg by mouth daily.      . magnesium gluconate (MAGONATE) 500 MG tablet Take 500 mg by mouth daily.       . metoprolol (LOPRESSOR) 50 MG tablet Take 1 tablet (50 mg total) by mouth 2 (two) times daily.  60 tablet  11  . Multiple Vitamin (MULTIVITAMIN WITH MINERALS) TABS Take 1 tablet by mouth daily.      . nitroGLYCERIN (NITROSTAT) 0.4 MG SL tablet Place 1 tablet (0.4 mg total) under the tongue every 5 (five) minutes as needed for chest pain.  25 tablet  3  . omeprazole (PRILOSEC) 20  MG capsule Take 20 mg by mouth daily as needed (indigestion).       . Rivaroxaban (XARELTO) 20 MG TABS tablet Take 1 tablet (20 mg total) by mouth every evening.  30 tablet    . Saw Palmetto, Serenoa repens, (SAW PALMETTO PO) Take 1 capsule by mouth 2 (two) times daily.      . traMADol (ULTRAM) 50 MG tablet Take 1 tablet (50 mg total) by mouth every 6 (six) hours as needed for pain.  20 tablet  0  . vitamin C (ASCORBIC ACID) 500 MG tablet Take 500 mg by mouth daily.        . [DISCONTINUED] diltiazem (CARDIZEM CD) 120 MG 24 hr capsule       . [DISCONTINUED] warfarin (COUMADIN) 4 MG tablet Take 4 mg by mouth daily.         No current facility-administered medications on file prior to visit.   Physical exam:  Filed Vitals:   12/11/12 1252 12/11/12 1255  BP: 129/66 117/67  Pulse: 60 60  Resp: 16   Height: 6' (1.829 m)   Weight: 199 lb (90.266 kg)  SpO2: 99%     Neck: Well-healed right carotid endarterectomy incision, no carotid bruits  Neuro: Upper extremity lower extremity 5/5 motor strength, tongue midline  Assessment: Doing well status post carotid endarterectomy  Plan: Followup 6 months carotid duplex exam  Fabienne Bruns, MD [facility] [place] Office: [phone] Pager: [phone]

## 2012-12-17 NOTE — Addendum Note (Signed)
Addended by: Sharee Pimple on: 12/17/2012 12:15 PM   Modules accepted: Orders

## 2012-12-22 ENCOUNTER — Telehealth: Payer: Self-pay | Admitting: Internal Medicine

## 2012-12-22 MED ORDER — FUROSEMIDE 80 MG PO TABS: 80.0000 mg | ORAL_TABLET | Freq: Every day | ORAL | Status: DC

## 2012-12-22 NOTE — Telephone Encounter (Signed)
Called and spoke with patient  His kidney function is normal.  Will continue  I have called in a refill

## 2012-12-22 NOTE — Telephone Encounter (Signed)
New Problem:  Pt states he wants to know if he needs to continue on Lasix. Pt states he heard it was bad for his kidneys. Please advise

## 2013-01-28 ENCOUNTER — Ambulatory Visit: Payer: Medicare Other | Admitting: Nurse Practitioner

## 2013-02-10 ENCOUNTER — Emergency Department (HOSPITAL_COMMUNITY): Payer: Medicare Other

## 2013-02-10 ENCOUNTER — Telehealth: Payer: Self-pay | Admitting: *Deleted

## 2013-02-10 ENCOUNTER — Telehealth: Payer: Self-pay | Admitting: Internal Medicine

## 2013-02-10 ENCOUNTER — Emergency Department (HOSPITAL_COMMUNITY)
Admission: EM | Admit: 2013-02-10 | Discharge: 2013-02-10 | Disposition: A | Discharge status: Home / Self Care | Payer: Medicare Other | Attending: Emergency Medicine | Admitting: Emergency Medicine

## 2013-02-10 ENCOUNTER — Encounter (HOSPITAL_COMMUNITY): Payer: Self-pay | Admitting: Emergency Medicine

## 2013-02-10 DIAGNOSIS — K219 Gastro-esophageal reflux disease without esophagitis: Secondary | ICD-10-CM | POA: Insufficient documentation

## 2013-02-10 DIAGNOSIS — Z950 Presence of cardiac pacemaker: Secondary | ICD-10-CM | POA: Insufficient documentation

## 2013-02-10 DIAGNOSIS — Z8679 Personal history of other diseases of the circulatory system: Secondary | ICD-10-CM | POA: Insufficient documentation

## 2013-02-10 DIAGNOSIS — Z885 Allergy status to narcotic agent status: Secondary | ICD-10-CM | POA: Insufficient documentation

## 2013-02-10 DIAGNOSIS — I495 Sick sinus syndrome: Secondary | ICD-10-CM | POA: Insufficient documentation

## 2013-02-10 DIAGNOSIS — I252 Old myocardial infarction: Secondary | ICD-10-CM | POA: Insufficient documentation

## 2013-02-10 DIAGNOSIS — Z79899 Other long term (current) drug therapy: Secondary | ICD-10-CM | POA: Insufficient documentation

## 2013-02-10 DIAGNOSIS — I2510 Atherosclerotic heart disease of native coronary artery without angina pectoris: Secondary | ICD-10-CM | POA: Insufficient documentation

## 2013-02-10 DIAGNOSIS — I10 Essential (primary) hypertension: Secondary | ICD-10-CM | POA: Insufficient documentation

## 2013-02-10 DIAGNOSIS — E039 Hypothyroidism, unspecified: Secondary | ICD-10-CM | POA: Insufficient documentation

## 2013-02-10 DIAGNOSIS — I739 Peripheral vascular disease, unspecified: Secondary | ICD-10-CM | POA: Insufficient documentation

## 2013-02-10 DIAGNOSIS — I509 Heart failure, unspecified: Secondary | ICD-10-CM | POA: Insufficient documentation

## 2013-02-10 DIAGNOSIS — E785 Hyperlipidemia, unspecified: Secondary | ICD-10-CM | POA: Insufficient documentation

## 2013-02-10 DIAGNOSIS — R0989 Other specified symptoms and signs involving the circulatory and respiratory systems: Secondary | ICD-10-CM | POA: Insufficient documentation

## 2013-02-10 DIAGNOSIS — M7989 Other specified soft tissue disorders: Secondary | ICD-10-CM | POA: Insufficient documentation

## 2013-02-10 DIAGNOSIS — Z7902 Long term (current) use of antithrombotics/antiplatelets: Secondary | ICD-10-CM | POA: Insufficient documentation

## 2013-02-10 DIAGNOSIS — I4891 Unspecified atrial fibrillation: Secondary | ICD-10-CM | POA: Insufficient documentation

## 2013-02-10 DIAGNOSIS — R0609 Other forms of dyspnea: Secondary | ICD-10-CM | POA: Insufficient documentation

## 2013-02-10 DIAGNOSIS — I428 Other cardiomyopathies: Secondary | ICD-10-CM | POA: Insufficient documentation

## 2013-02-10 DIAGNOSIS — Z8673 Personal history of transient ischemic attack (TIA), and cerebral infarction without residual deficits: Secondary | ICD-10-CM | POA: Insufficient documentation

## 2013-02-10 DIAGNOSIS — Z87891 Personal history of nicotine dependence: Secondary | ICD-10-CM | POA: Insufficient documentation

## 2013-02-10 DIAGNOSIS — M129 Arthropathy, unspecified: Secondary | ICD-10-CM | POA: Insufficient documentation

## 2013-02-10 DIAGNOSIS — R9431 Abnormal electrocardiogram [ECG] [EKG]: Secondary | ICD-10-CM | POA: Insufficient documentation

## 2013-02-10 LAB — CBC
MCH: 32 pg (ref 26.0–34.0)
MCHC: 33.4 g/dL (ref 30.0–36.0)
MCV: 95.8 fL (ref 78.0–100.0)
Platelets: 142 10*3/uL — ABNORMAL LOW (ref 150–400)
RDW: 15.2 % (ref 11.5–15.5)

## 2013-02-10 LAB — BASIC METABOLIC PANEL
Calcium: 9.2 mg/dL (ref 8.4–10.5)
Creatinine, Ser: 1.41 mg/dL — ABNORMAL HIGH (ref 0.50–1.35)
GFR calc Af Amer: 54 mL/min — ABNORMAL LOW (ref >90)
GFR calc non Af Amer: 47 mL/min — ABNORMAL LOW (ref >90)
Sodium: 133 mEq/L — ABNORMAL LOW (ref 135–145)

## 2013-02-10 LAB — PRO B NATRIURETIC PEPTIDE: Pro B Natriuretic peptide (BNP): 7341 pg/mL — ABNORMAL HIGH (ref 0–450)

## 2013-02-10 MED ORDER — FUROSEMIDE 10 MG/ML IJ SOLN: 60.0000 mg | Freq: Once | INTRAMUSCULAR | Status: DC

## 2013-02-10 MED ORDER — FUROSEMIDE 20 MG PO TABS
80.0000 mg | ORAL_TABLET | Freq: Once | ORAL | Status: AC
  Administered 2013-02-10: 80 mg via ORAL
  Filled 2013-02-10: qty 4

## 2013-02-10 NOTE — Telephone Encounter (Signed)
See telephone note.

## 2013-02-10 NOTE — ED Provider Notes (Signed)
CSN: [id number]     Arrival date & time 02/10/13  1521 History   First MD Initiated Contact with Patient 02/10/13 1558     Chief Complaint  Patient presents with  . Shortness of Breath  . Cough   (Consider location/radiation/quality/duration/timing/severity/associated sxs/prior Treatment) HPI Trevor Simpson is a 79 y.o. male who presents to the emergency department for concern of that he may be having problems with his heart failure.  Patient reports that over the last few days he has been getting SOB when he is ambulating.  Described as mild.  He has had these symptoms in the past but wanted to get to them before they got too intense as they did last time.  Reports having to sleep in his recliner over last few nights 2/2 dyspnea.  No chest pain.  No fevers.  Has had mild cough.  Some leg swelling.  Still making urine with lasix.  No other symptoms.  Past Medical History  Diagnosis Date  . Coronary artery disease   . Other and unspecified hyperlipidemia   . Hypertension   . Atrial fibrillation   . Sinoatrial node dysfunction   . PVD (peripheral vascular disease)     s/p renal artery stent 20-04  . Pacemaker st Jude   . AAA (abdominal aortic aneurysm)     repaired  . Systolic congestive heart failure   . Cardiomyopathy   . Carotid artery occlusion   . Atrial fibrillation   . Myocardial infarction   . Stroke     left sided affected no residual effects  . GERD (gastroesophageal reflux disease)   . Arthritis   . Hypothyroidism    Past Surgical History  Procedure Laterality Date  . Inguinal hernia repair      left  . Cardiac pacemaker placement  08/01/2009    st jude dual chamber  . Colonoscopy    . Esophagogastroduodenoscopy    . Translumminal angioplasty    . Tee without cardioversion N/A 09/23/2012    Procedure: TRANSESOPHAGEAL ECHOCARDIOGRAM (TEE);  Surgeon: Lewayne Bunting, MD;  Location: [facility] ENDOSCOPY;  Service: Cardiovascular;  Laterality: N/A;  . Cardioversion N/A  09/23/2012    Procedure: CARDIOVERSION;  Surgeon: Lewayne Bunting, MD;  Location: [facility] ENDOSCOPY;  Service: Cardiovascular;  Laterality: N/A;  . Renal artery stent    . Abdominal aortic aneurysm repair    . Cardiac catheterization  08/2012    Stents  . Endarterectomy Right 11/25/2012    Procedure: ENDARTERECTOMY CAROTID With Dacron Patch Angioplasty;  Surgeon: Sherren Kerns, MD;  Location: [facility] OR;  Service: Vascular;  Laterality: Right;  . Carotid endarterectomy Right 11-25-12   Family History  Problem Relation Age of Onset  . CAD Father   . Hypertension Father   . Heart disease Father   . CAD Brother   . Heart disease Brother   . Heart attack Brother   . Diabetes Brother   . Lung cancer Sister   . Heart disease Sister   . Hypertension Sister   . Hypertension Mother   . Hypertension Daughter   . Cancer Son    History  Substance Use Topics  . Smoking status: Former Smoker -- 1.00 packs/day for 40 years    Quit date: 03/19/1993  . Smokeless tobacco: Current User    Types: Chew  . Alcohol Use: No    Review of Systems  Constitutional: Negative for fever and chills.  HENT: Negative for congestion and sore throat.   Respiratory: Positive  for cough and shortness of breath.   Gastrointestinal: Negative for nausea, vomiting, abdominal pain, diarrhea and constipation.  Endocrine: Negative for polyuria.  Genitourinary: Negative for dysuria and hematuria.  Musculoskeletal: Negative for neck pain.  Skin: Negative for rash.  Neurological: Negative for headaches.  Psychiatric/Behavioral: Negative.   All other systems reviewed and are negative.    Allergies  Codeine and Morphine  Home Medications   Current Outpatient Rx  Name  Route  Sig  Dispense  Refill  . ALPRAZolam (XANAX) 0.25 MG tablet   Oral   Take 0.25 mg by mouth 3 (three) times daily as needed for sleep or anxiety.          Marland Kitchen amiodarone (PACERONE) 200 MG tablet   Oral   Take 1 tablet (200 mg total) by mouth  daily.         Marland Kitchen atorvastatin (LIPITOR) 20 MG tablet   Oral   Take 20 mg by mouth daily.           . B Complex-C (SUPER B COMPLEX) TABS   Oral   Take 1 tablet by mouth daily.          . calcium carbonate (OS-CAL) 600 MG TABS   Oral   Take 600 mg by mouth daily with breakfast.          . cetirizine (ZYRTEC) 10 MG tablet   Oral   Take 10 mg by mouth daily.          . fish oil-omega-3 fatty acids 1000 MG capsule   Oral   Take 1 g by mouth 2 (two) times daily.          . furosemide (LASIX) 80 MG tablet   Oral   Take 1 tablet (80 mg total) by mouth daily.   90 tablet   3   . levothyroxine (SYNTHROID, LEVOTHROID) 112 MCG tablet   Oral   Take 112 mcg by mouth daily.         . magnesium gluconate (MAGONATE) 500 MG tablet   Oral   Take 500 mg by mouth daily.          . metoprolol (LOPRESSOR) 50 MG tablet   Oral   Take 1 tablet (50 mg total) by mouth 2 (two) times daily.   60 tablet   11   . Multiple Vitamin (MULTIVITAMIN WITH MINERALS) TABS   Oral   Take 1 tablet by mouth daily.         . nitroGLYCERIN (NITROSTAT) 0.4 MG SL tablet   Sublingual   Place 1 tablet (0.4 mg total) under the tongue every 5 (five) minutes as needed for chest pain.   25 tablet   3   . omeprazole (PRILOSEC) 20 MG capsule   Oral   Take 20 mg by mouth daily as needed (indigestion).          . Rivaroxaban (XARELTO) 20 MG TABS tablet   Oral   Take 1 tablet (20 mg total) by mouth every evening.   30 tablet      . Saw Palmetto, Serenoa repens, (SAW PALMETTO PO)   Oral   Take 1 capsule by mouth 2 (two) times daily.         . tamsulosin (FLOMAX) 0.4 MG CAPS capsule   Oral   Take 0.4 mg by mouth daily after supper.          . vitamin C (ASCORBIC ACID) 500 MG tablet   Oral  Take 500 mg by mouth daily.            BP 139/63  Pulse 60  Temp(Src) 97.9 F (36.6 C) (Oral)  Resp 13  Wt 200 lb (90.719 kg)  SpO2 96% Physical Exam  Nursing note and vitals  reviewed. Constitutional: He is oriented to person, place, and time. He appears well-developed and well-nourished. No distress.  HENT:  Head: Normocephalic and atraumatic.  Right Ear: External ear normal.  Left Ear: External ear normal.  Mouth/Throat: Oropharynx is clear and moist. No oropharyngeal exudate.  Eyes: Conjunctivae are normal. Pupils are equal, round, and reactive to light. Right eye exhibits no discharge.  Neck: Normal range of motion. Neck supple. No tracheal deviation present.  Cardiovascular: Normal rate, regular rhythm and intact distal pulses.   Pulmonary/Chest: Effort normal. No respiratory distress. He has no wheezes. He has no rales.  Abdominal: Soft. He exhibits no distension. There is no tenderness. There is no rebound and no guarding.  Musculoskeletal: Normal range of motion. He exhibits edema (1+ symmetric).  Neurological: He is alert and oriented to person, place, and time.  Skin: Skin is warm and dry. No rash noted. He is not diaphoretic.  Psychiatric: He has a normal mood and affect.    ED Course  Procedures (including critical care time) Labs Review Labs Reviewed  CBC - Abnormal; Notable for the following:    Platelets 142 (*)    All other components within normal limits  BASIC METABOLIC PANEL - Abnormal; Notable for the following:    Sodium 133 (*)    Chloride 93 (*)    Glucose, Bld 200 (*)    Creatinine, Ser 1.41 (*)    GFR calc non Af Amer 47 (*)    GFR calc Af Amer 54 (*)    All other components within normal limits  PRO B NATRIURETIC PEPTIDE - Abnormal; Notable for the following:    Pro B Natriuretic peptide (BNP) 7341.0 (*)    All other components within normal limits  POCT I-STAT TROPONIN I   Imaging Review Dg Chest 2 View  02/10/2013   CLINICAL DATA:  Shortness of breath, history of CHF and hypertension  EXAM: CHEST  2 VIEW  COMPARISON:  10/10/2012  FINDINGS: Cardiac silhouette is enlarged. There is diffuse prominence of the interstitial  markings and mild peribronchial cuffing. No focal regions of consolidation are appreciated. A small left pleural effusion is appreciated. Visualized osseous structures are grossly unremarkable. A left-sided chest wall cardiac pacing unit is identified lead tips projecting region the right atrium right ventricle.  IMPRESSION: 1. Interstitial findings likely reflecting a component of pulmonary edema without underlying component of pulmonary fibrosis. An infectious or inflammatory interstitial infiltrate cannot be excluded 2. Small left effusion 3. Surveillance evaluation recommended   Electronically Signed   By: Salome Holmes M.D.   On: 02/10/2013 16:29    EKG Interpretation    Date/Time:  Tuesday February 10 2013 15:32:36 EST Ventricular Rate:  69 PR Interval:  222 QRS Duration: 142 QT Interval:  434 QTC Calculation: 465 R Axis:   122 Text Interpretation:  Sinus rhythm with 1st degree A-V block Right axis deviation Non-specific intra-ventricular conduction block Cannot rule out Anteroseptal infarct , age undetermined T wave abnormality, consider inferior ischemia Abnormal ECG When compared with ECG of 09/28/2012, Sinus rhythm has replaced Atrial fibrillation Confirmed by Preston Fleeting  MD, DAVID ([id number]) on 02/10/2013 5:32:35 PM            MDM  1. CHF exacerbation    KEALAN BUCHAN is a 79 y.o. male with history of CHF who presents to the ED with mild CHF exacerbation.  Exam with mild edema.  Patient however not hypoxic and still functional.  Doubt MI or PE given history and exam.  No pneumonia on CXR or by exam.  Recommend taking lasix BID for next three days and following up with PCP on Monday.  First dose of Lasix given here.  Strict return precautions discussed.  Patient safe for discharge home.  Patient discharged.    Arloa Koh, MD 02/11/13 [phone]

## 2013-02-10 NOTE — ED Notes (Signed)
Pt c/o cough with SOB and swelling in legs; pt sts hx of CHF and feels same

## 2013-02-10 NOTE — ED Provider Notes (Signed)
79 year old male comes in with two-week history of increasing dyspnea on exertion. He has been sleeping in a chair during this time although he states he has not acted tried to lie down. Denies chest pain, heaviness, tightness, pressure. He has had a cough with occasional, small amount of hemoptysis. He is anticoagulated on Pradaxa. On exam, lungs have bibasilar rales, heart is regular rate and rhythm. There is 1+ presacral and 1+ pretibial edema. BNP has come back significantly elevated. He will get a dose of furosemide in the ED and he is to double his dose of furosemide next 3 days.  I saw and evaluated the patient, reviewed the resident's note and I agree with the findings and plan.  EKG Interpretation    Date/Time:  Tuesday February 10 2013 15:32:36 EST Ventricular Rate:  69 PR Interval:  222 QRS Duration: 142 QT Interval:  434 QTC Calculation: 465 R Axis:   122 Text Interpretation:  Sinus rhythm with 1st degree A-V block Right axis deviation Non-specific intra-ventricular conduction block Cannot rule out Anteroseptal infarct , age undetermined T wave abnormality, consider inferior ischemia Abnormal ECG When compared with ECG of 09/28/2012, Sinus rhythm has replaced Atrial fibrillation Confirmed by Preston Fleeting  MD, Nyazia Canevari ([id number]) on 02/10/2013 5:32:35 PM              Dione Booze, MD 02/10/13 1732

## 2013-02-10 NOTE — Telephone Encounter (Signed)
C/o wt gain, 1 month ago dry wt post hospital dc 187 lb, current wt 200.6 lb. States he can't lay flat, belly tightness, cough with bright red blood x 3 days. Called pcp 2 days ago but was not in office/ wanted to call here yesterday but was not feeling well enough to call. Unsure if he has a temp/ bp 137/102 p 68. Sob noted in speech. Pt was advised to go to ED. Pt was reminded of 3-5 lb wt gain rule, pt verbalized understanding.

## 2013-02-10 NOTE — Telephone Encounter (Signed)
New message     Pt states he has gained wt, sob, coughing up blood---have not missed any medication dosages

## 2013-02-16 ENCOUNTER — Encounter: Payer: Self-pay | Admitting: Internal Medicine

## 2013-02-16 ENCOUNTER — Ambulatory Visit (INDEPENDENT_AMBULATORY_CARE_PROVIDER_SITE_OTHER): Payer: Medicare Other | Admitting: *Deleted

## 2013-02-16 DIAGNOSIS — Z950 Presence of cardiac pacemaker: Secondary | ICD-10-CM

## 2013-02-16 DIAGNOSIS — I495 Sick sinus syndrome: Secondary | ICD-10-CM

## 2013-02-16 LAB — MDC_IDC_ENUM_SESS_TYPE_REMOTE
Brady Statistic AP VS Percent: 78 %
Brady Statistic AS VP Percent: 1 %
Brady Statistic RA Percent Paced: 90 %
Brady Statistic RV Percent Paced: 14 %
Date Time Interrogation Session: 20141201164023
Implantable Pulse Generator Serial Number: 7105776
Lead Channel Impedance Value: 460 Ohm
Lead Channel Pacing Threshold Pulse Width: 0.4 ms
Lead Channel Sensing Intrinsic Amplitude: 12 mV
Lead Channel Sensing Intrinsic Amplitude: 3.1 mV
Lead Channel Setting Pacing Pulse Width: 0.4 ms
Lead Channel Setting Sensing Sensitivity: 2 mV

## 2013-02-20 ENCOUNTER — Encounter: Payer: Self-pay | Admitting: *Deleted

## 2013-02-23 ENCOUNTER — Other Ambulatory Visit: Payer: Self-pay | Admitting: Internal Medicine

## 2013-02-23 DIAGNOSIS — R042 Hemoptysis: Secondary | ICD-10-CM

## 2013-02-24 ENCOUNTER — Ambulatory Visit
Admission: RE | Admit: 2013-02-24 | Discharge: 2013-02-24 | Disposition: A | Discharge status: Home / Self Care | Payer: Medicare Other | Source: Ambulatory Visit | Attending: Internal Medicine | Admitting: Internal Medicine

## 2013-02-24 DIAGNOSIS — R042 Hemoptysis: Secondary | ICD-10-CM

## 2013-02-26 ENCOUNTER — Ambulatory Visit (INDEPENDENT_AMBULATORY_CARE_PROVIDER_SITE_OTHER): Payer: Medicare Other | Admitting: Internal Medicine

## 2013-02-26 ENCOUNTER — Other Ambulatory Visit (INDEPENDENT_AMBULATORY_CARE_PROVIDER_SITE_OTHER): Payer: Medicare Other

## 2013-02-26 ENCOUNTER — Encounter: Payer: Self-pay | Admitting: Internal Medicine

## 2013-02-26 ENCOUNTER — Telehealth: Payer: Self-pay | Admitting: Internal Medicine

## 2013-02-26 VITALS — BP 102/62 | HR 60 | Ht 72.0 in | Wt 195.8 lb

## 2013-02-26 DIAGNOSIS — J438 Other emphysema: Secondary | ICD-10-CM

## 2013-02-26 DIAGNOSIS — R0602 Shortness of breath: Secondary | ICD-10-CM

## 2013-02-26 DIAGNOSIS — R042 Hemoptysis: Secondary | ICD-10-CM

## 2013-02-26 DIAGNOSIS — J439 Emphysema, unspecified: Secondary | ICD-10-CM | POA: Insufficient documentation

## 2013-02-26 LAB — BRAIN NATRIURETIC PEPTIDE: Pro B Natriuretic peptide (BNP): 1100 pg/mL — ABNORMAL HIGH (ref 0.0–100.0)

## 2013-02-26 MED ORDER — CLINDAMYCIN HCL 300 MG PO CAPS: 300.0000 mg | ORAL_CAPSULE | Freq: Three times a day (TID) | ORAL | Status: DC

## 2013-02-26 MED ORDER — CIPROFLOXACIN HCL 500 MG PO TABS: 500.0000 mg | ORAL_TABLET | Freq: Two times a day (BID) | ORAL | Status: DC

## 2013-02-26 MED ORDER — TIOTROPIUM BROMIDE MONOHYDRATE 18 MCG IN CAPS: 18.0000 ug | ORAL_CAPSULE | Freq: Every day | RESPIRATORY_TRACT | Status: DC

## 2013-02-26 NOTE — Telephone Encounter (Signed)
Dan  Cpould you please see him soon. He has hemoptysis with infikltrates on RUL that I wonder if is due to mitral regurg; seen several cases like that  Thanks  Dr. Kalman Shan, M.D., [facility].C.P Pulmonary and Critical Care Medicine Staff Physician [facility] [place] [facility] Pager: [phone], If no answer or between  15:00h - 7:00h: call [phone]  02/26/2013 4:30 PM

## 2013-02-26 NOTE — Assessment & Plan Note (Signed)
#  Coughing up blood - I think is due to necrotizing pneumonia or endobronchial lesion in airway or  Pulmonary edema due to mitral regurgitation jet in right upper lobe    - start clindamycin 300mg  three times daily x 14 days; watch out for diarrhea  - start ciprofloxacin 500mg  twice daily x 14 days - stop levaquin - check BNP blood test for heart  - I will send message to DR bEnsimohn to re-evaluate your heart with echo and visit - Will need followup CT chest; timing to be decided based on course - will need bronch when more stable  #Followup  1 week wit my NP tammy to make sure you are beetter  call any time if there are problems

## 2013-02-26 NOTE — Progress Notes (Signed)
Subjective:    Patient ID: Trevor Simpson, male    DOB: [birth year], 79 y.o.   MRN: [id number]  HPI  IOV 02/26/2013  Chief Complaint  Patient presents with  . Pulmonary Consult    for hemoptysis x 2 weeks ago. At that time he coughed up bright red blood  daily x 2 weeks, he then stopeed for 3-4 days, and then had a small amount this morning.    79 year old male with frequent admissions in July 2014. Is on chronic xarelto. Ablation. In July 2014 he was admitted for severe congestive heart failure and found to have ejection fraction of 25% with moderate mitral regurgitation. Then in followup 2014 admitted for stroke and found to have a high-grade right internal carotid artery stenosis and underwent carotid endarterectomy. For physical deconditioning after all this but was doing fairly okay operable 3 weeks ago when he started having some intermittent moderate amount of hemoptysis frank blood. Primary care physician stopped his xarelto and put him on LEvaauin a seven-day antibiotic which seemed to help. The last 4 days he's not had hemoptysis but this morning again he had hemoptysis. Is a primary care physician started her 3 day Levaquin course. Overall he feels chronically deconditioned with exertional dyspnea and physically tiring out even after making his bed. There is no associated cough with all this. No edema chest pain. At no point in time he's had a fever   Waking desat test 185 feet x 2 laps: 88%  ECHO July 2014 - ef 25% with Moderate MR  CLINICAL DATA: Hemoptysis for 2 weeks  EXAM:  CT CHEST WITHOUT CONTRAST 02/24/13 TECHNIQUE:  Multidetector CT imaging of the chest was performed following the  standard protocol without IV contrast.  COMPARISON: 09/12/2010  FINDINGS:  Small bilateral pleural effusions are identified. Moderate changes  of emphysema identified. Airspace consolidation involving much of  the right upper lobe is identified. There is a pulmonary nodule  within the  right lower lobe posteriorly which measures 1.6 cm, image  53/series 4. There is mild pleural thickening involving the  interlobar fissure of the left lung. Nodule in the upper lobe of the  left lung measures 5 mm is new from previous exam. Right lower lobe  nodule measures 3 mm, image 29/series 4.  There is mild cardiac enlargement. Enlarged calcified and  noncalcified lymph nodes are identified. Right paratracheal lymph  node measures 1.7 cm, image 24/series 3. Previously 1.1 cm. Sub-  carinal lymph node measures 1.2 cm, image 28/series 3. Previously  0.9 cm. Left paratracheal lymph node measures 1.4 cm, image  22/series 3. Previously 1 cm.  No enlarged axillary or supraclavicular lymph nodes.  Incidental imaging through the upper abdomen shows no acute  findings. Calcified atherosclerotic disease involves the thoracic  and abdominal aorta. There prominent Coronary artery calcifications  involving the LAD, RCA and left circumflex coronary arteries.  Review of the visualized bony structures is significant for mild  thoracic spondylosis. No aggressive lytic or sclerotic bone lesions.  IMPRESSION:  1. There is airspace consolidation involving the right upper lobe.  In the acute setting this may represent pneumonia. Alternatively  this could represent tumor. If there is a concern for stress set if  there is a clinical symptoms of pneumonia then recommend a follow-up  CT of the chest after appropriate antibiotic therapy.  2. Small bilateral pulmonary nodules are new from previous exam and  appear nonspecific.  3. Enlarged calcified and noncalcified mediastinal lymph nodes have  increased in size from previous exam.  Electronically Signed  By: Signa Kell M.D.  On: 02/24/2013 16:37         Past Medical History  Diagnosis Date  . Coronary artery disease   . Other and unspecified hyperlipidemia   . Hypertension   . Atrial fibrillation   . Sinoatrial node dysfunction   . PVD  (peripheral vascular disease)     s/p renal artery stent 20-04  . Pacemaker st Jude   . AAA (abdominal aortic aneurysm)     repaired  . Systolic congestive heart failure   . Cardiomyopathy   . Carotid artery occlusion   . Atrial fibrillation   . Myocardial infarction   . Stroke     left sided affected no residual effects  . GERD (gastroesophageal reflux disease)   . Arthritis   . Hypothyroidism      Family History  Problem Relation Age of Onset  . CAD Father   . Hypertension Father   . Heart disease Father   . CAD Brother   . Heart disease Brother   . Heart attack Brother   . Diabetes Brother   . Lung cancer Sister   . Heart disease Sister   . Hypertension Sister   . Hypertension Mother   . Hypertension Daughter   . Cancer Son      History   Social History  . Marital Status: Married    Spouse Name: N/A    Number of Children: N/A  . Years of Education: N/A   Occupational History  . Not on file.   Social History Main Topics  . Smoking status: Former Smoker -- 1.00 packs/day for 40 years    Types: Cigarettes    Quit date: 03/19/1993  . Smokeless tobacco: Current User    Types: Chew  . Alcohol Use: No  . Drug Use: No  . Sexual Activity: Not Currently   Other Topics Concern  . Not on file   Social History Narrative  . No narrative on file     Allergies  Allergen Reactions  . Codeine     "tripped out on it"  . Morphine     "tripped out on it"     Outpatient Prescriptions Prior to Visit  Medication Sig Dispense Refill  . ALPRAZolam (XANAX) 0.25 MG tablet Take 0.25 mg by mouth 3 (three) times daily as needed for sleep or anxiety.       Marland Kitchen amiodarone (PACERONE) 200 MG tablet Take 1 tablet (200 mg total) by mouth daily.      Marland Kitchen atorvastatin (LIPITOR) 20 MG tablet Take 20 mg by mouth daily.        . B Complex-C (SUPER B COMPLEX) TABS Take 1 tablet by mouth daily.       . calcium carbonate (OS-CAL) 600 MG TABS Take 600 mg by mouth daily with breakfast.        . cetirizine (ZYRTEC) 10 MG tablet Take 10 mg by mouth daily.       . fish oil-omega-3 fatty acids 1000 MG capsule Take 1 g by mouth 2 (two) times daily.       . furosemide (LASIX) 80 MG tablet Take 1 tablet (80 mg total) by mouth daily.  90 tablet  3  . levothyroxine (SYNTHROID, LEVOTHROID) 112 MCG tablet Take 112 mcg by mouth daily.      . magnesium gluconate (MAGONATE) 500 MG tablet Take 500 mg by mouth daily.       Marland Kitchen  metoprolol (LOPRESSOR) 50 MG tablet Take 1 tablet (50 mg total) by mouth 2 (two) times daily.  60 tablet  11  . Multiple Vitamin (MULTIVITAMIN WITH MINERALS) TABS Take 1 tablet by mouth daily.      . nitroGLYCERIN (NITROSTAT) 0.4 MG SL tablet Place 1 tablet (0.4 mg total) under the tongue every 5 (five) minutes as needed for chest pain.  25 tablet  3  . omeprazole (PRILOSEC) 20 MG capsule Take 20 mg by mouth daily as needed (indigestion).       . Saw Palmetto, Serenoa repens, (SAW PALMETTO PO) Take 1 capsule by mouth 2 (two) times daily.      . tamsulosin (FLOMAX) 0.4 MG CAPS capsule Take 0.4 mg by mouth daily after supper.       . vitamin C (ASCORBIC ACID) 500 MG tablet Take 500 mg by mouth daily.        . Rivaroxaban (XARELTO) 20 MG TABS tablet Take 1 tablet (20 mg total) by mouth every evening.  30 tablet     No facility-administered medications prior to visit.      Review of Systems  Constitutional: Positive for appetite change and unexpected weight change. Negative for fever.  HENT: Negative for congestion, dental problem, ear pain, nosebleeds, postnasal drip, rhinorrhea, sinus pressure, sneezing, sore throat and trouble swallowing.   Eyes: Negative for redness and itching.  Respiratory: Positive for cough and shortness of breath. Negative for chest tightness and wheezing.   Cardiovascular: Positive for palpitations. Negative for leg swelling.  Gastrointestinal: Negative for nausea and vomiting.  Genitourinary: Negative for dysuria.  Musculoskeletal: Negative for  joint swelling.  Skin: Negative for rash.  Neurological: Negative for headaches.  Hematological: Does not bruise/bleed easily.  Psychiatric/Behavioral: Negative for dysphoric mood. The patient is not nervous/anxious.        Objective:   Physical Exam  Nursing note and vitals reviewed. Constitutional: He is oriented to person, place, and time. He appears well-developed and well-nourished. No distress.  Looks somewhat deconditioned  HENT:  Head: Normocephalic and atraumatic.  Right Ear: External ear normal.  Left Ear: External ear normal.  Mouth/Throat: Oropharynx is clear and moist. No oropharyngeal exudate.  Eyes: Conjunctivae and EOM are normal. Pupils are equal, round, and reactive to light. Right eye exhibits no discharge. Left eye exhibits no discharge. No scleral icterus.  Neck: Normal range of motion. Neck supple. No JVD present. No tracheal deviation present. No thyromegaly present.  Cardiovascular: Normal rate, regular rhythm and intact distal pulses.  Exam reveals no gallop and no friction rub.   No murmur heard. Pulmonary/Chest: Effort normal and breath sounds normal. No respiratory distress. He has no wheezes. He has no rales. He exhibits no tenderness.  Thoracic kyphosis Some crackles on right chest in front  Abdominal: Soft. Bowel sounds are normal. He exhibits no distension and no mass. There is no tenderness. There is no rebound and no guarding.  Musculoskeletal: Normal range of motion. He exhibits no edema and no tenderness.  Lymphadenopathy:    He has no cervical adenopathy.  Neurological: He is alert and oriented to person, place, and time. He has normal reflexes. No cranial nerve deficit. Coordination normal.  Skin: Skin is warm and dry. No rash noted. He is not diaphoretic. No erythema. No pallor.  Psychiatric: He has a normal mood and affect. His behavior is normal. Judgment and thought content normal.          Assessment & Plan:

## 2013-02-26 NOTE — Patient Instructions (Addendum)
#  Coughing up blood - I think is due to necrotizing pneumonia or endobronchial lesion in airway or  Pulmonary edema due to mitral regurgitation jet in right upper lobe    - start clindamycin 300mg  three times daily x 14 days; watch out for diarrhea  - start ciprofloxacin 500mg  twice daily x 14 days - stop levaquin - check BNP blood test for heart  - I will send message to DR bEnsimohn to re-evaluate your heart with echo and visit - Will need followup CT chest; timing to be decided based on course - will need bronch when more stable  #Emphysema  - Please start spiriva 1 puff daily - take sample, script and show technique - reassess walking desat test at followup  #Followup  1 week wit my NP tammy to make sure you are beetter  call any time if there are problems

## 2013-02-26 NOTE — Assessment & Plan Note (Signed)
#  Emphysema  - Please start spiriva 1 puff daily - take sample, script and show technique

## 2013-03-05 ENCOUNTER — Ambulatory Visit (INDEPENDENT_AMBULATORY_CARE_PROVIDER_SITE_OTHER)
Admission: RE | Admit: 2013-03-05 | Discharge: 2013-03-05 | Disposition: A | Discharge status: Home / Self Care | Payer: Medicare Other | Source: Ambulatory Visit | Attending: Adult Health | Admitting: Adult Health

## 2013-03-05 ENCOUNTER — Ambulatory Visit (INDEPENDENT_AMBULATORY_CARE_PROVIDER_SITE_OTHER): Payer: Medicare Other | Admitting: Adult Health

## 2013-03-05 ENCOUNTER — Telehealth: Payer: Self-pay | Admitting: Internal Medicine

## 2013-03-05 VITALS — BP 110/60 | HR 60 | Temp 96.7°F | Ht 72.0 in | Wt 194.6 lb

## 2013-03-05 DIAGNOSIS — J189 Pneumonia, unspecified organism: Secondary | ICD-10-CM

## 2013-03-05 NOTE — Patient Instructions (Addendum)
Restart Prilosec 20mg  daily before meal  Restart Cipro 500mg  Twice daily  -finish course .  Start on Probiotic -Align daily for next 4 weeks.  GERD diet  No eating 3 hrs before bedtime , do not lie down after eating.  I will call with xray results.  Please contact office for sooner follow up if symptoms do not improve or worsen or seek emergency care  Follow up Dr. Marchelle Gearing in 2 weeks and As needed  -with chest xray

## 2013-03-06 NOTE — Telephone Encounter (Signed)
TP spoke with spouse regarding cxr results Will sign off

## 2013-03-06 NOTE — Progress Notes (Signed)
Quick Note:  TP spoke with spouse 03/05/13 ______

## 2013-03-09 DIAGNOSIS — J189 Pneumonia, unspecified organism: Secondary | ICD-10-CM | POA: Insufficient documentation

## 2013-03-09 NOTE — Progress Notes (Signed)
Subjective:    Patient ID: Trevor Simpson, male    DOB: 1933-05-09, 79 y.o.   MRN: [id number]  HPI  IOV 02/26/2013 Chief Complaint  Patient presents with  . Pulmonary Consult    for hemoptysis x 2 weeks ago. At that time he coughed up bright red blood  daily x 2 weeks, he then stopeed for 3-4 days, and then had a small amount this morning.    79 year old male with frequent admissions in July 2014. Is on chronic xarelto. Ablation. In July 2014 he was admitted for severe congestive heart failure and found to have ejection fraction of 25% with moderate mitral regurgitation. Then in followup 2014 admitted for stroke and found to have a high-grade right internal carotid artery stenosis and underwent carotid endarterectomy. For physical deconditioning after all this but was doing fairly okay operable 3 weeks ago when he started having some intermittent moderate amount of hemoptysis frank blood. Primary care physician stopped his xarelto and put him on LEvaauin a seven-day antibiotic which seemed to help. The last 4 days he's not had hemoptysis but this morning again he had hemoptysis. Is a primary care physician started her 3 day Levaquin course. Overall he feels chronically deconditioned with exertional dyspnea and physically tiring out even after making his bed. There is no associated cough with all this. No edema chest pain. At no point in time he's had a fever   Waking desat test 185 feet x 2 laps: 88%  ECHO July 2014 - ef 25% with Moderate MR  CLINICAL DATA: Hemoptysis for 2 weeks  EXAM:  CT CHEST WITHOUT CONTRAST 02/24/13 TECHNIQUE:  Multidetector CT imaging of the chest was performed following the  standard protocol without IV contrast.  COMPARISON: 09/12/2010  FINDINGS:  Small bilateral pleural effusions are identified. Moderate changes  of emphysema identified. Airspace consolidation involving much of  the right upper lobe is identified. There is a pulmonary nodule  within the  right lower lobe posteriorly which measures 1.6 cm, image  53/series 4. There is mild pleural thickening involving the  interlobar fissure of the left lung. Nodule in the upper lobe of the  left lung measures 5 mm is new from previous exam. Right lower lobe  nodule measures 3 mm, image 29/series 4.  There is mild cardiac enlargement. Enlarged calcified and  noncalcified lymph nodes are identified. Right paratracheal lymph  node measures 1.7 cm, image 24/series 3. Previously 1.1 cm. Sub-  carinal lymph node measures 1.2 cm, image 28/series 3. Previously  0.9 cm. Left paratracheal lymph node measures 1.4 cm, image  22/series 3. Previously 1 cm.  No enlarged axillary or supraclavicular lymph nodes.  Incidental imaging through the upper abdomen shows no acute  findings. Calcified atherosclerotic disease involves the thoracic  and abdominal aorta. There prominent Coronary artery calcifications  involving the LAD, RCA and left circumflex coronary arteries.  Review of the visualized bony structures is significant for mild  thoracic spondylosis. No aggressive lytic or sclerotic bone lesions.  IMPRESSION:  1. There is airspace consolidation involving the right upper lobe.  In the acute setting this may represent pneumonia. Alternatively  this could represent tumor. If there is a concern for stress set if  there is a clinical symptoms of pneumonia then recommend a follow-up  CT of the chest after appropriate antibiotic therapy.  2. Small bilateral pulmonary nodules are new from previous exam and  appear nonspecific.  3. Enlarged calcified and noncalcified mediastinal lymph nodes have  increased in size from previous exam.  Electronically Signed  By: Signa Kell M.D.  On: 02/24/2013 16:37    03/05/13 Follow up PNA  Pt returns for follow up for RUL PNA  Seen 1 week ago, concern for possible necrotizing PNA due RUL consolidation that did not improve with levaquin for 7 days.  Pt did have  hemoptysis , while on Xarelto. Xarelto was held.  CT chest showed RUL consolidation .  Pt was started on Cipro and Clindamycin . Pt says he took until yesterday but the abx caused severe stomach upset with diarrhea and nausea. Hemoptysis resolved and has not had any blood tinged mucus >1 wk.  Patient complains of loose stools. He denies any bloody diarrhea. Patient denies any hemoptysis, orthopnea, PND, or increased leg swelling. Chest x-ray today shows right upper lobe consolidation.   Review of Systems  Constitutional:   No  weight loss, night sweats,  Fevers, chills,  +fatigue, or  lassitude.  HEENT:   No headaches,  Difficulty swallowing,  Tooth/dental problems, or  Sore throat,                No sneezing, itching, ear ache, + nasal congestion, post nasal drip,   CV:  No chest pain,  Orthopnea, PND, swelling in lower extremities, anasarca, dizziness, palpitations, syncope.   GI  No heartburn, indigestion, abdominal pain,  +nausea,  diarrhea,  +change in bowel habits, loss of appetite  Resp: .  No chest wall deformity  Skin: no rash or lesions.  GU: no dysuria, change in color of urine, no urgency or frequency.  No flank pain, no hematuria   MS:  No joint pain or swelling.  No decreased range of motion.  No back pain.  Psych:  No change in mood or affect. No depression or anxiety.  No memory loss.          Objective:   Physical Exam  GEN: A/Ox3; pleasant , NAD, elderly   HEENT:  [place]/AT,  EACs-clear, TMs-wnl, NOSE-clear, THROAT-clear, no lesions, no postnasal drip or exudate noted.   NECK:  Supple w/ fair ROM; no JVD; normal carotid impulses w/o bruits; no thyromegaly or nodules palpated; no lymphadenopathy.  RESP  Few rhonchi , no accessory muscle use, no dullness to percussion  CARD:  RRR, no m/r/g  , no peripheral edema, pulses intact, no cyanosis or clubbing.  GI:   Soft & nt; nml bowel sounds; no organomegaly or masses detected.  Musco: Warm bil, no deformities  or joint swelling noted.   Neuro: alert, no focal deficits noted.    Skin: Warm, no lesions or rashes        Assessment & Plan:

## 2013-03-09 NOTE — Assessment & Plan Note (Addendum)
Right upper lobe pneumonia, unresponsive to Levaquin. Patient has been changed over to Cipro, and clindamycin, unfortunately, has been unable to tolerate due to GI discomfort. He has finished clindamycin for 7 days. Will extend Cipro,to finish a total ten-day course Hemoptysis has resolved. Patient has now been off Xarelto.  Patient has plans to restart Coumadin per his cardiologist. Patient is to report it hemoptysis , restarts Patient returned back to the office in 2 weeks with a followup chest x-ray. Patient advised if symptoms do not improve or worsen. He is to contact office for sooner follow up ago to the emergency room department -tx stomach issues with conservation therapy  Plan -case and films discussed with Dr. Venida Jarvis Prilosec 20mg  daily before meal  Restart Cipro 500mg  Twice daily  -finish course .  Start on Probiotic -Align daily for next 4 weeks.  GERD diet  No eating 3 hrs before bedtime , do not lie down after eating.  I will call with xray results.  Please contact office for sooner follow up if symptoms do not improve or worsen or seek emergency care  Follow up Dr. Marchelle Gearing in 2 weeks and As needed  -with chest xray

## 2013-03-23 ENCOUNTER — Encounter: Payer: Self-pay | Admitting: Internal Medicine

## 2013-03-23 ENCOUNTER — Ambulatory Visit (INDEPENDENT_AMBULATORY_CARE_PROVIDER_SITE_OTHER): Payer: Managed Care, Other (non HMO) | Admitting: Internal Medicine

## 2013-03-23 ENCOUNTER — Ambulatory Visit (INDEPENDENT_AMBULATORY_CARE_PROVIDER_SITE_OTHER)
Admission: RE | Admit: 2013-03-23 | Discharge: 2013-03-23 | Disposition: A | Discharge status: Home / Self Care | Payer: Managed Care, Other (non HMO) | Source: Ambulatory Visit | Attending: Internal Medicine | Admitting: Internal Medicine

## 2013-03-23 VITALS — BP 136/72 | HR 61 | Ht 72.0 in | Wt 197.0 lb

## 2013-03-23 DIAGNOSIS — I4891 Unspecified atrial fibrillation: Secondary | ICD-10-CM

## 2013-03-23 DIAGNOSIS — R042 Hemoptysis: Secondary | ICD-10-CM

## 2013-03-23 NOTE — Patient Instructions (Signed)
#  pneumonia and coughing up blood Glad you are better Have CXR today; will call with results  #CHF  - my office will ensure you have followup with cardiology Dr Jeffie Pollock heart failure clinic, first available   #FOllowup = depending on cxr result

## 2013-03-23 NOTE — Progress Notes (Signed)
Subjective:    Patient ID: Trevor Simpson, male    DOB: 12/30/1933, 80 y.o.   MRN: [id number]  HPI    OV 03/23/2013  Chief Complaint  Patient presents with  . Follow-up    Pt deneis any cough, SOB,  hempotysis at this time.      FU pneumonia RUL with hemoptysis  Now after extended abx Rx with clinida and cipro he ifeels back to normal. He saw NP 03/09/13 and at that time abx extended fore few more days. He has been off abx > 7-10 days and is back to baseline. Feels good. BAck on coumadin. However, not seen cardiology yet for routine CHF followup  CXR today ispending  Review of Systems  Constitutional: Negative for fever and unexpected weight change.  HENT: Negative for congestion, dental problem, ear pain, nosebleeds, postnasal drip, rhinorrhea, sinus pressure, sneezing, sore throat and trouble swallowing.   Eyes: Negative for redness and itching.  Respiratory: Negative for cough, chest tightness, shortness of breath and wheezing.   Cardiovascular: Negative for palpitations and leg swelling.  Gastrointestinal: Negative for nausea and vomiting.  Genitourinary: Negative for dysuria.  Musculoskeletal: Negative for joint swelling.  Skin: Negative for rash.  Neurological: Negative for headaches.  Hematological: Does not bruise/bleed easily.  Psychiatric/Behavioral: Negative for dysphoric mood. The patient is not nervous/anxious.   Current outpatient prescriptions:ALPRAZolam (XANAX) 0.25 MG tablet, Take 0.25 mg by mouth 3 (three) times daily as needed for sleep or anxiety. , Disp: , Rfl: ;  amiodarone (PACERONE) 200 MG tablet, Take 1 tablet (200 mg total) by mouth daily., Disp: , Rfl: ;  atorvastatin (LIPITOR) 20 MG tablet, Take 20 mg by mouth daily.  , Disp: , Rfl: ;  B Complex-C (SUPER B COMPLEX) TABS, Take 1 tablet by mouth daily. , Disp: , Rfl:  calcium carbonate (OS-CAL) 600 MG TABS, Take 600 mg by mouth daily with breakfast. , Disp: , Rfl: ;  cetirizine (ZYRTEC) 10 MG tablet,  Take 10 mg by mouth daily. , Disp: , Rfl: ;  ciprofloxacin (CIPRO) 500 MG tablet, Take 1 tablet (500 mg total) by mouth 2 (two) times daily., Disp: 28 tablet, Rfl: 0;  clindamycin (CLEOCIN) 300 MG capsule, Take 1 capsule (300 mg total) by mouth 3 (three) times daily., Disp: 42 capsule, Rfl: 0 fish oil-omega-3 fatty acids 1000 MG capsule, Take 1 g by mouth 2 (two) times daily. , Disp: , Rfl: ;  furosemide (LASIX) 80 MG tablet, Take 1 tablet (80 mg total) by mouth daily., Disp: 90 tablet, Rfl: 3;  levothyroxine (SYNTHROID, LEVOTHROID) 112 MCG tablet, Take 112 mcg by mouth daily., Disp: , Rfl: ;  magnesium gluconate (MAGONATE) 500 MG tablet, Take 500 mg by mouth daily. , Disp: , Rfl:  metoprolol (LOPRESSOR) 50 MG tablet, Take 1 tablet (50 mg total) by mouth 2 (two) times daily., Disp: 60 tablet, Rfl: 11;  Multiple Vitamin (MULTIVITAMIN WITH MINERALS) TABS, Take 1 tablet by mouth daily., Disp: , Rfl: ;  nitroGLYCERIN (NITROSTAT) 0.4 MG SL tablet, Place 1 tablet (0.4 mg total) under the tongue every 5 (five) minutes as needed for chest pain., Disp: 25 tablet, Rfl: 3 omeprazole (PRILOSEC) 20 MG capsule, Take 20 mg by mouth daily as needed (indigestion). , Disp: , Rfl: ;  Saw Palmetto, Serenoa repens, (SAW PALMETTO PO), Take 1 capsule by mouth 2 (two) times daily., Disp: , Rfl: ;  tamsulosin (FLOMAX) 0.4 MG CAPS capsule, Take 0.4 mg by mouth daily after supper. , Disp: ,  Rfl: ;  vitamin C (ASCORBIC ACID) 500 MG tablet, Take 500 mg by mouth daily.  , Disp: , Rfl:  warfarin (COUMADIN) 5 MG tablet, Take 5 mg by mouth as directed., Disp: , Rfl: ;  tiotropium (SPIRIVA) 18 MCG inhalation capsule, Place 1 capsule (18 mcg total) into inhaler and inhale daily., Disp: 30 capsule, Rfl: 6;  [DISCONTINUED] diltiazem (CARDIZEM CD) 120 MG 24 hr capsule, , Disp: , Rfl:       Objective:   Physical Exam   Physical Exam  GEN: A/Ox3; pleasant , NAD, elderly   HEENT:  [place]/AT,  EACs-clear, TMs-wnl, NOSE-clear, THROAT-clear, no  lesions, no postnasal drip or exudate noted.   NECK:  Supple w/ fair ROM; no JVD; normal carotid impulses w/o bruits; no thyromegaly or nodules palpated; no lymphadenopathy.  RESP  Few rhonchi , no accessory muscle use, no dullness to percussion  CARD:  RRR, no m/r/g  , no peripheral edema, pulses intact, no cyanosis or clubbing.  GI:   Soft & nt; nml bowel sounds; no organomegaly or masses detected.  Musco: Warm bil, no deformities or joint swelling noted.   Neuro: alert, no focal deficits noted.    Skin: Warm, no lesions or rashes      Assessment & Plan:  #pneumonia and coughing up blood Glad you are better Have CXR today; will call with results to decide on fu CT scan chest date  #CHF  - my office will ensure you have followup with cardiology Dr Jeffie Pollock heart failure clinic, first available   #FOllowup = depending on cxr result

## 2013-03-25 ENCOUNTER — Telehealth: Payer: Self-pay | Admitting: Internal Medicine

## 2013-03-25 DIAGNOSIS — J189 Pneumonia, unspecified organism: Secondary | ICD-10-CM

## 2013-03-25 DIAGNOSIS — R0602 Shortness of breath: Secondary | ICD-10-CM

## 2013-03-25 DIAGNOSIS — J439 Emphysema, unspecified: Secondary | ICD-10-CM

## 2013-03-25 DIAGNOSIS — R042 Hemoptysis: Secondary | ICD-10-CM

## 2013-03-25 NOTE — Telephone Encounter (Signed)
    CXR shows some clearance only. Do CT chest around feb 10th, 2015 and return for followup  Dr. Brand Males, M.D., [facility].C.P Pulmonary and Critical Care Medicine Staff Physician [facility] Pager: [phone], If no answer or between  15:00h - 7:00h: call [phone]  03/25/2013 11:28 PM

## 2013-03-26 NOTE — Telephone Encounter (Signed)
Pt advised and order placed. Khaylee Mcevoy, CMA  

## 2013-03-31 ENCOUNTER — Encounter (HOSPITAL_COMMUNITY): Payer: Self-pay

## 2013-03-31 ENCOUNTER — Ambulatory Visit (HOSPITAL_COMMUNITY)
Admission: RE | Admit: 2013-03-31 | Discharge: 2013-03-31 | Disposition: A | Discharge status: Home / Self Care | Payer: Managed Care, Other (non HMO) | Source: Ambulatory Visit | Attending: Internal Medicine | Admitting: Internal Medicine

## 2013-03-31 VITALS — BP 124/72 | HR 60 | Ht 72.0 in | Wt 194.1 lb

## 2013-03-31 DIAGNOSIS — J438 Other emphysema: Secondary | ICD-10-CM

## 2013-03-31 DIAGNOSIS — Z8673 Personal history of transient ischemic attack (TIA), and cerebral infarction without residual deficits: Secondary | ICD-10-CM | POA: Insufficient documentation

## 2013-03-31 DIAGNOSIS — Z87891 Personal history of nicotine dependence: Secondary | ICD-10-CM | POA: Insufficient documentation

## 2013-03-31 DIAGNOSIS — I5020 Unspecified systolic (congestive) heart failure: Secondary | ICD-10-CM

## 2013-03-31 DIAGNOSIS — I509 Heart failure, unspecified: Secondary | ICD-10-CM | POA: Insufficient documentation

## 2013-03-31 DIAGNOSIS — I739 Peripheral vascular disease, unspecified: Secondary | ICD-10-CM | POA: Insufficient documentation

## 2013-03-31 DIAGNOSIS — K219 Gastro-esophageal reflux disease without esophagitis: Secondary | ICD-10-CM | POA: Insufficient documentation

## 2013-03-31 DIAGNOSIS — J4489 Other specified chronic obstructive pulmonary disease: Secondary | ICD-10-CM | POA: Insufficient documentation

## 2013-03-31 DIAGNOSIS — E039 Hypothyroidism, unspecified: Secondary | ICD-10-CM | POA: Insufficient documentation

## 2013-03-31 DIAGNOSIS — Z7901 Long term (current) use of anticoagulants: Secondary | ICD-10-CM | POA: Insufficient documentation

## 2013-03-31 DIAGNOSIS — E785 Hyperlipidemia, unspecified: Secondary | ICD-10-CM | POA: Insufficient documentation

## 2013-03-31 DIAGNOSIS — I4891 Unspecified atrial fibrillation: Secondary | ICD-10-CM | POA: Insufficient documentation

## 2013-03-31 DIAGNOSIS — J189 Pneumonia, unspecified organism: Secondary | ICD-10-CM | POA: Insufficient documentation

## 2013-03-31 DIAGNOSIS — I252 Old myocardial infarction: Secondary | ICD-10-CM | POA: Insufficient documentation

## 2013-03-31 DIAGNOSIS — Z79899 Other long term (current) drug therapy: Secondary | ICD-10-CM | POA: Insufficient documentation

## 2013-03-31 DIAGNOSIS — Z950 Presence of cardiac pacemaker: Secondary | ICD-10-CM | POA: Insufficient documentation

## 2013-03-31 DIAGNOSIS — J449 Chronic obstructive pulmonary disease, unspecified: Secondary | ICD-10-CM | POA: Insufficient documentation

## 2013-03-31 DIAGNOSIS — I10 Essential (primary) hypertension: Secondary | ICD-10-CM | POA: Insufficient documentation

## 2013-03-31 DIAGNOSIS — I2510 Atherosclerotic heart disease of native coronary artery without angina pectoris: Secondary | ICD-10-CM | POA: Insufficient documentation

## 2013-03-31 DIAGNOSIS — J439 Emphysema, unspecified: Secondary | ICD-10-CM

## 2013-03-31 DIAGNOSIS — I428 Other cardiomyopathies: Secondary | ICD-10-CM | POA: Insufficient documentation

## 2013-03-31 DIAGNOSIS — I5022 Chronic systolic (congestive) heart failure: Secondary | ICD-10-CM | POA: Insufficient documentation

## 2013-03-31 MED ORDER — BISOPROLOL FUMARATE 5 MG PO TABS: 5.0000 mg | ORAL_TABLET | Freq: Every day | ORAL | Status: DC

## 2013-03-31 NOTE — Progress Notes (Signed)
Patient ID: Trevor Simpson, male   DOB: 05-20-1933, 80 y.o.   MRN: [id number]  PCP: Trevor Simpson Pulmonology: Trevor Simpson Cardiology: Trevor Simpson Referring MD: Trevor Simpson  HPI: Trevor Simpson is a 80 year old with history of  COPD, symptomatic bradycardia, s/p ST Jude PPM, PAF, HTN, PAD, CVA and chronic systolic heart failure EF 20% referred to HF clinic by Trevor Simpson for further evaluation of CHF and RUL infiltrate.   He was treated for RUL pneumonia in early December. He developed hemoptysis and his PCP stopped Xarelto and placed him on levaquin.  At that time he was referred to pulmonary. Trevor Simpson  Stopped levaquin and started cipro and clindamycin. A couple of weeks ago he started coumadin. He has not had any bleeding problems. F/u CXR has shown persistent RUL opacity and there is a question if this might be asymmetric edema.  He says he is able to walk but complains of fatigue and takes rest breaks. Denies orthopnea or PND. No significant edema or CP. Weight at home 186. He rarely takes an extra lasix.    S/P RHC/LHC 08/2012  RA 12/14 with a mean of 11 mmHg  RV 38/11 mmHg  PA 41/23 with a mean of 29 mmHg  PCWP 20 over 20 with a mean of 15 mmHg  LV 101/13 mmHg  AO 108/52 with a mean of 71 mmHg  Cardiac Output (Fick) 4.3 L per minute  Cardiac Index (Fick) 2.0 L per minute per meter square  Left mainstem: The left main is long with 20% stenosis distally.  Left anterior descending (LAD): The left anterior descending artery is moderately calcified in the proximal vessel. There is diffuse disease in the proximal vessel up to 30%. The first diagonal is relatively small and has mild disease proximally. The ramus intermediate branch is a large branch that bifurcates in the mid vessel. The proximal vessel has a 90-95% stenosis.  Left circumflex (LCx): The left circumflex is relatively small. It gives rise to a single marginal branch and then continues in the AV groove. The first marginal branch has  diffuse 60-70% stenosis proximally.  Right coronary artery (RCA): The right coronary is occluded proximally. There are right to right and left to right collaterals. Severe two-vessel obstructive coronary disease. Chronic total occlusion of the right coronary Successful stenting of the ramus intermediate branch with a bare-metal stent  09/28/12 AST 27 ALT 21  02/10/13 K 3.8 Creatinine  1.41  02/26/13 Pro BNP 1100  SH: Lives at home with wife FH: Father, Brother - CAD HTN, DM        Sister - lung cancer     Review of Systems:     Cardiac Review of Systems: {Y] = yes [ ]  = no  Chest Pain [    ]  Resting SOB [   ] Exertional SOB  [ Y ]  Orthopnea [  ]   Pedal Edema [   ]    Palpitations [  ] Syncope  [  ]   Presyncope [   ]  General Review of Systems: [Y] = yes [  ]=no Constitional: recent weight change [ Y ]; anorexia [  ]; fatigue [ Y ]; nausea [  ]; night sweats [  ]; fever [  ]; or chills [  ];  Dental: poor dentition[  ]; Last Dentist visit:   Eye : blurred vision [  ]; diplopia [   ]; vision changes [  ];  Amaurosis fugax[  ]; Resp: cough [ Y ];  wheezing[  ];  hemoptysis[  ]; shortness of breath[Y  ]; paroxysmal nocturnal dyspnea[  ]; dyspnea on exertion[Y  ]; or orthopnea[Y  ];  GI:  gallstones[  ], vomiting[  ];  dysphagia[  ]; melena[  ];  hematochezia [  ]; heartburn[  ];    GU: kidney stones [  ]; hematuria[  ];   dysuria [  ];  nocturia[  ];  history of     obstruction [  ];                 Skin: rash, swelling[  ];, hair loss[  ];  peripheral edema[  ];  or itching[  ]; Musculosketetal: myalgias[  ];  joint swelling[  ];  joint erythema[  ];  joint pain[  ];  back pain[  ];  Heme/Lymph: bruising[  ];  bleeding[  ];  anemia[  ];  Neuro: TIA[  ];  headaches[  ];  stroke[ Y ];  vertigo[  ];  seizures[  ];   paresthesias[  ];  difficulty walking[   ];  Psych:depression[  ]; anxiety[  ];  Endocrine: diabetes[  ];  thyroid dysfunction[  ];  Other:    Past Medical History  Diagnosis Date  . Coronary artery disease   . Other and unspecified hyperlipidemia   . Hypertension   . Atrial fibrillation   . Sinoatrial node dysfunction   . PVD (peripheral vascular disease)     s/p renal artery stent 20-04  . Pacemaker st Jude   . AAA (abdominal aortic aneurysm)     repaired  . Systolic congestive heart failure   . Cardiomyopathy   . Carotid artery occlusion   . Atrial fibrillation   . Myocardial infarction   . Stroke     left sided affected no residual effects  . GERD (gastroesophageal reflux disease)   . Arthritis   . Hypothyroidism     Current Outpatient Prescriptions  Medication Sig Dispense Refill  . ALPRAZolam (XANAX) 0.25 MG tablet Take 0.25 mg by mouth 3 (three) times daily as needed for sleep or anxiety.       Marland Kitchen amiodarone (PACERONE) 200 MG tablet Take 1 tablet (200 mg total) by mouth daily.      Marland Kitchen atorvastatin (LIPITOR) 20 MG tablet Take 20 mg by mouth daily.        . B Complex-C (SUPER B COMPLEX) TABS Take 1 tablet by mouth daily.       . calcium carbonate (OS-CAL) 600 MG TABS Take 600 mg by mouth daily with breakfast.       . cetirizine (ZYRTEC) 10 MG tablet Take 10 mg by mouth daily.       . fish oil-omega-3 fatty acids 1000 MG capsule Take 1 g by mouth 2 (two) times daily.       . furosemide (LASIX) 80 MG tablet Take 1 tablet (80 mg total) by mouth daily.  90 tablet  3  . levothyroxine (SYNTHROID, LEVOTHROID) 112 MCG tablet Take 112 mcg by mouth daily.      . magnesium gluconate (MAGONATE) 500 MG tablet Take 500 mg by mouth daily.       . metoprolol (LOPRESSOR) 50 MG tablet Take 1 tablet (50 mg total) by mouth 2 (  two) times daily.  60 tablet  11  . Multiple Vitamin (MULTIVITAMIN WITH MINERALS) TABS Take 1 tablet by mouth daily.      . nitroGLYCERIN (NITROSTAT) 0.4 MG SL tablet Place 1 tablet (0.4 mg total) under the  tongue every 5 (five) minutes as needed for chest pain.  25 tablet  3  . omeprazole (PRILOSEC) 20 MG capsule Take 20 mg by mouth daily as needed (indigestion).       . Saw Palmetto, Serenoa repens, (SAW PALMETTO PO) Take 1 capsule by mouth 2 (two) times daily.      . tamsulosin (FLOMAX) 0.4 MG CAPS capsule Take 0.4 mg by mouth daily after supper.       . tiotropium (SPIRIVA) 18 MCG inhalation capsule Place 1 capsule (18 mcg total) into inhaler and inhale daily.  30 capsule  6  . vitamin C (ASCORBIC ACID) 500 MG tablet Take 500 mg by mouth daily.        Marland Kitchen warfarin (COUMADIN) 5 MG tablet Take 5 mg by mouth as directed.      . [DISCONTINUED] diltiazem (CARDIZEM CD) 120 MG 24 hr capsule        No current facility-administered medications for this encounter.     Allergies  Allergen Reactions  . Codeine     "tripped out on it"  . Morphine     "tripped out on it"    History   Social History  . Marital Status: Married    Spouse Name: N/A    Number of Children: N/A  . Years of Education: N/A   Occupational History  . Not on file.   Social History Main Topics  . Smoking status: Former Smoker -- 1.00 packs/day for 40 years    Types: Cigarettes    Quit date: 03/19/1993  . Smokeless tobacco: Current User    Types: Chew  . Alcohol Use: No  . Drug Use: No  . Sexual Activity: Not Currently   Other Topics Concern  . Not on file   Social History Narrative  . No narrative on file    Family History  Problem Relation Age of Onset  . CAD Father   . Hypertension Father   . Heart disease Father   . CAD Brother   . Heart disease Brother   . Heart attack Brother   . Diabetes Brother   . Lung cancer Sister   . Heart disease Sister   . Hypertension Sister   . Hypertension Mother   . Hypertension Daughter   . Cancer Son     PHYSICAL EXAM: Filed Vitals:   03/31/13 1148  BP: 124/72  Pulse: 60   General:  Elderly frail appearing. No respiratory difficulty. Wife and daughter in  law present.  HEENT: normal Neck: supple. no JVD. Carotids 2+ bilat; no bruits. No lymphadenopathy or thryomegaly appreciated. R neck scar Cor: PMI nondisplaced. Regular rate & rhythm. No rubs, gallops or Trevor 2/6 . Lungs: distant heart sounds. clear Abdomen: soft, nontender, nondistended. No hepatosplenomegaly. No bruits or masses. Good bowel sounds. Extremities: no cyanosis, clubbing, rash, edema Neuro: alert & oriented x 3, cranial nerves grossly intact. moves all 4 extremities w/o difficulty. Affect pleasant.  ECG: NSR. Non-specific intraventricular conduction delay (LBBB-like) QRS 17ms  No results found for this or any previous visit (from the past 24 hour(s)). No results found.   ASSESSMENT & PLAN: 1. ICM Chronic Systolic Heart Failure EF 20-25%  NYHA III- IIIb. Volume status stable. Continue lasix 80  mg daily On Metoprolol will switch to bisoprolol 5 mg daily  Next visit will consider ARB with lung disease Continue CRT-D upgrade  Discussed daily weights, low salt food choices, and limiting fluid intake to less than 2 liters per day.   2. PAF S/P DC-CV 09/2012 on amiodarone 200 mg daily. Switched fromt xarelto due to hemoptysis in setting of PNA. On coumadin.   3. CAD S/P BMS ramus intermediate branch with a bare-metal stent. No evidence of ischemia.   4. RUL pneumonia-followed by Trevor Simpson  Follow up in 2 months with Trevor Donnamae Jude 12:22 PM  Patient seen and examined with Darrick Grinder, NP. We discussed all aspects of the encounter. I agree with the assessment and plan as stated above.   I have reviewed ECHO and Chest CT images personally. Overall functional capacity seems quite diminished - NYHA III-IIIB in the face of severe LV dysfunction. That said, it is difficult to quantify just how much of his limitation is due to HF versus recent PNA, COPD, PAD and deconditioning. In looking at his imaging studies, I think his RUL infiltrate is clearly PNA and not HF and if  it does not clear he may need bronchoscopy to exclude obstructive lesion.   Currently volume status looks quite good on lasix 80 daily and we will continue this. Reinforced need for daily weights and reviewed use of sliding scale diuretics. We will also change lopressor to bisoprolol in setting of his lung disease. He has f/u with Trevor. Chase Simpson soon for repeat imaging of his PNA. We will f/u with him in a month or so. Once acute lung issues resolved, we will consider CPX to assess the severity of his limitation and how much of it is related to HF. We briefly discussed the need for possible RHC and consideration of CRT-D and we will discuss further at his next visit.   Benay Spice 7:16 PM

## 2013-03-31 NOTE — Patient Instructions (Signed)
Follow up in 2 months  Stop lopressor  Take Bisoprolol 5 mg daily   Do the following things EVERYDAY: 1) Weigh yourself in the morning before breakfast. Write it down and keep it in a log. 2) Take your medicines as prescribed 3) Eat low salt foods-Limit salt (sodium) to 2000 mg per day.  4) Stay as active as you can everyday 5) Limit all fluids for the day to less than 2 liters

## 2013-04-24 ENCOUNTER — Other Ambulatory Visit: Payer: Self-pay | Admitting: Vascular Surgery

## 2013-04-24 DIAGNOSIS — Z48812 Encounter for surgical aftercare following surgery on the circulatory system: Secondary | ICD-10-CM

## 2013-04-24 DIAGNOSIS — I6529 Occlusion and stenosis of unspecified carotid artery: Secondary | ICD-10-CM

## 2013-04-28 ENCOUNTER — Ambulatory Visit (INDEPENDENT_AMBULATORY_CARE_PROVIDER_SITE_OTHER)
Admission: RE | Admit: 2013-04-28 | Discharge: 2013-04-28 | Disposition: A | Discharge status: Home / Self Care | Payer: Managed Care, Other (non HMO) | Source: Ambulatory Visit | Attending: Internal Medicine | Admitting: Internal Medicine

## 2013-04-28 DIAGNOSIS — R042 Hemoptysis: Secondary | ICD-10-CM

## 2013-04-28 DIAGNOSIS — R0602 Shortness of breath: Secondary | ICD-10-CM

## 2013-04-28 DIAGNOSIS — J438 Other emphysema: Secondary | ICD-10-CM

## 2013-04-28 DIAGNOSIS — J439 Emphysema, unspecified: Secondary | ICD-10-CM

## 2013-04-28 DIAGNOSIS — J189 Pneumonia, unspecified organism: Secondary | ICD-10-CM

## 2013-05-05 ENCOUNTER — Ambulatory Visit: Payer: Self-pay | Admitting: Internal Medicine

## 2013-05-11 ENCOUNTER — Telehealth: Payer: Self-pay | Admitting: Internal Medicine

## 2013-05-11 NOTE — Telephone Encounter (Signed)
Returning call can be reached at [phone].Trevor Simpson

## 2013-05-11 NOTE — Telephone Encounter (Signed)
Notes Recorded by Brand Males, MD on 04/30/2013 at 1:58 PM Nearly resolved infiltreats. Improved nodes. Will discuss at fu 05/05/13   Pt made aware of his CT chest results; patient was unable to keep 05-05-13 appt due to weather; I have [facility] to 06-01-13 at 12 noon; if this is too far out please advise Anderson Malta or Triage so they can call patient and work patient in . Thanks.

## 2013-05-11 NOTE — Telephone Encounter (Signed)
Mid march fine

## 2013-05-11 NOTE — Telephone Encounter (Signed)
ATC line busy x 4 wcb

## 2013-05-20 ENCOUNTER — Ambulatory Visit (INDEPENDENT_AMBULATORY_CARE_PROVIDER_SITE_OTHER): Payer: Managed Care, Other (non HMO) | Admitting: *Deleted

## 2013-05-20 ENCOUNTER — Encounter: Payer: Self-pay | Admitting: Internal Medicine

## 2013-05-20 DIAGNOSIS — Z950 Presence of cardiac pacemaker: Secondary | ICD-10-CM

## 2013-05-20 DIAGNOSIS — I495 Sick sinus syndrome: Secondary | ICD-10-CM

## 2013-05-20 LAB — MDC_IDC_ENUM_SESS_TYPE_REMOTE
Battery Voltage: 2.9 V
Brady Statistic AP VP Percent: 12 %
Brady Statistic AP VS Percent: 80 %
Brady Statistic AS VP Percent: 1 %
Brady Statistic AS VS Percent: 6.4 %
Brady Statistic RA Percent Paced: 90 %
Date Time Interrogation Session: 20150304205342
Implantable Pulse Generator Model: 2110
Lead Channel Impedance Value: 360 Ohm
Lead Channel Impedance Value: 510 Ohm
Lead Channel Pacing Threshold Amplitude: 0.875 V
Lead Channel Pacing Threshold Pulse Width: 0.4 ms
Lead Channel Sensing Intrinsic Amplitude: 2.7 mV
Lead Channel Setting Pacing Amplitude: 1.125
Lead Channel Setting Pacing Pulse Width: 0.4 ms
Lead Channel Setting Sensing Sensitivity: 2 mV
MDC IDC MSMT BATTERY REMAINING LONGEVITY: 32 mo
MDC IDC MSMT LEADCHNL RV SENSING INTR AMPL: 12 mV
MDC IDC PG SERIAL: 7105776
MDC IDC SET LEADCHNL RA PACING AMPLITUDE: 3.5 V
MDC IDC STAT BRADY RV PERCENT PACED: 12 %

## 2013-06-01 ENCOUNTER — Ambulatory Visit (INDEPENDENT_AMBULATORY_CARE_PROVIDER_SITE_OTHER): Payer: Managed Care, Other (non HMO) | Admitting: Internal Medicine

## 2013-06-01 ENCOUNTER — Encounter: Payer: Self-pay | Admitting: Internal Medicine

## 2013-06-01 VITALS — BP 118/82 | HR 60 | Ht 72.0 in | Wt 193.0 lb

## 2013-06-01 DIAGNOSIS — J438 Other emphysema: Secondary | ICD-10-CM

## 2013-06-01 DIAGNOSIS — R0602 Shortness of breath: Secondary | ICD-10-CM

## 2013-06-01 DIAGNOSIS — J439 Emphysema, unspecified: Secondary | ICD-10-CM

## 2013-06-01 DIAGNOSIS — R042 Hemoptysis: Secondary | ICD-10-CM

## 2013-06-01 NOTE — Patient Instructions (Addendum)
#  Hemoptysis  - glad this is largely resolved. CT chest just shows residual scarring. I am concerned though you had one recurrence recently. You need bronchoscopy but due to your financial issues, we agreed to watch this closely. Please call us if this happens again - repeat CT chest wo contrast in 9 months   #Shortness of breath and fatigue  -due to lung, heart and physical deonditioning  - refer pulmonary rehab near your home  [facility], [place], [place]) on basis of CHF - chronic systolic - do PFT at followupo  #Primary Care  - disucss referral to [facility] practice at convenient location with my [facility]  #FOllowup  6 months or sooner if needed Do PFT at folllowup

## 2013-06-01 NOTE — Progress Notes (Signed)
Subjective:    Patient ID: Trevor Simpson, male    DOB: Jul 31, 1933, 80 y.o.   MRN: [id number]  HPI  Followup hemoptysis due to right upper lobe pneumonic process. Not seen him in several months. He presents with his wife. CT scan of the chest 04/28/2013 shows complete resolution of pneumonic infiltrates. This is after his prolonged course of antibiotics. After that his had no further hemoptysis except though a few weeks ago he had one small scanty episode. At this point in time is not interested in bronchoscopy for the same although he understands the need for the indication. This is mainly due to cost issues. Of note,  reports that he quit smoking about 20 years ago. His smoking use included Cigarettes. He has a 40 pack-year smoking history. His smokeless tobacco use includes Chew.   In terms of his chronic systolic heart failure: He has seen heart failure clinic physician and is felt to be euvolemic.  Currently overall issue is that he is just physically deconditioned. He is more condition than when he was sick. He is walking 1 mile although very slowly but feels he can do better. He has never had pulmonary r function test. He is an ex-smoker. He is interested in pulmonary rehabilitation summer near his home either at [facility] or [facility] or in [place]. This is mainly due to cost constraints.  Past medical history reviewed: He loves his primary care physician Dr. [name] but he is unable to afford the co-pays and he wants to switch to [facility] primary care and wants to go to a satellite site    Review of Systems  Constitutional: Negative for fever and unexpected weight change.  HENT: Negative for congestion, dental problem, ear pain, nosebleeds, postnasal drip, rhinorrhea, sinus pressure, sneezing, sore throat and trouble swallowing.   Eyes: Negative for redness and itching.  Respiratory: Positive for shortness of breath. Negative for cough, chest  tightness and wheezing.   Cardiovascular: Negative for palpitations and leg swelling.  Gastrointestinal: Negative for nausea and vomiting.  Genitourinary: Negative for dysuria.  Musculoskeletal: Negative for joint swelling.  Skin: Negative for rash.  Neurological: Negative for headaches.  Hematological: Does not bruise/bleed easily.  Psychiatric/Behavioral: Negative for dysphoric mood. The patient is not nervous/anxious.        Objective:   Physical Exam  Nursing note and vitals reviewed. Constitutional: He is oriented to person, place, and time. He appears well-developed and well-nourished. No distress.  Tall thin Body mass index is 26.17 kg/(m^2).   HENT:  Head: Normocephalic and atraumatic.  Right Ear: External ear normal.  Left Ear: External ear normal.  Mouth/Throat: Oropharynx is clear and moist. No oropharyngeal exudate.  Eyes: Conjunctivae and EOM are normal. Pupils are equal, round, and reactive to light. Right eye exhibits no discharge. Left eye exhibits no discharge. No scleral icterus.  Neck: Normal range of motion. Neck supple. No JVD present. No tracheal deviation present. No thyromegaly present.  Cardiovascular: Normal rate, regular rhythm and intact distal pulses.  Exam reveals no gallop and no friction rub.   No murmur heard. Pulmonary/Chest: Effort normal and breath sounds normal. No respiratory distress. He has no wheezes. He has no rales. He exhibits no tenderness.  kyphotic  Abdominal: Soft. Bowel sounds are normal. He exhibits no distension and no mass. There is no tenderness. There is no rebound and no guarding.  Musculoskeletal: Normal range of motion. He exhibits no edema and no tenderness.  Lymphadenopathy:  He has no cervical adenopathy.  Neurological: He is alert and oriented to person, place, and time. He has normal reflexes. No cranial nerve deficit. Coordination normal.  Skin: Skin is warm and dry. No rash noted. He is not diaphoretic. No erythema.  No pallor.  Psychiatric: He has a normal mood and affect. His behavior is normal. Judgment and thought content normal.          Assessment & Plan:

## 2013-06-06 NOTE — Assessment & Plan Note (Signed)
Need to do PFt now that his hemoptysis and pna resolved. Will do at fu

## 2013-06-06 NOTE — Assessment & Plan Note (Signed)
#  Hemoptysis  - glad this is largely resolved. CT chest just shows residual scarring. I am concerned though you had one recurrence recently. You need bronchoscopy but due to your financial issues, we agreed to watch this closely. Please call us if this happens again - repeat CT chest wo contrast in 9 months  #FOllowup  6 months or sooner if needed

## 2013-06-07 NOTE — Assessment & Plan Note (Signed)
#  Shortness of breath and fatigue  -due to lung, heart and physical deonditioning  - refer pulmonary rehab near your home  [facility], [place], [place]) on basis of CHF - chronic systolic - do PFT at followupo

## 2013-06-10 ENCOUNTER — Telehealth: Payer: Self-pay | Admitting: Internal Medicine

## 2013-06-10 ENCOUNTER — Encounter: Payer: Self-pay | Admitting: Vascular Surgery

## 2013-06-10 NOTE — Telephone Encounter (Signed)
Fax has been received and placed in MR look at for signature.  Will forward to [place] so she is aware.

## 2013-06-11 ENCOUNTER — Ambulatory Visit (HOSPITAL_COMMUNITY)
Admission: RE | Admit: 2013-06-11 | Discharge: 2013-06-11 | Disposition: A | Discharge status: Home / Self Care | Payer: Medicare HMO | Source: Ambulatory Visit | Attending: Vascular Surgery | Admitting: Vascular Surgery

## 2013-06-11 ENCOUNTER — Encounter: Payer: Self-pay | Admitting: Vascular Surgery

## 2013-06-11 ENCOUNTER — Ambulatory Visit (INDEPENDENT_AMBULATORY_CARE_PROVIDER_SITE_OTHER): Payer: Managed Care, Other (non HMO) | Admitting: Vascular Surgery

## 2013-06-11 VITALS — BP 116/57 | HR 59 | Ht 72.0 in | Wt 194.0 lb

## 2013-06-11 DIAGNOSIS — I6529 Occlusion and stenosis of unspecified carotid artery: Secondary | ICD-10-CM

## 2013-06-11 DIAGNOSIS — Z48812 Encounter for surgical aftercare following surgery on the circulatory system: Secondary | ICD-10-CM

## 2013-06-11 NOTE — Progress Notes (Signed)
Patient is a 80 year old male who returns for followup today for right carotid endarterectomy on 11/25/2012. This was a symptomatic carotid stenosis with recent stroke. He denies any symptoms of TIA amaurosis or stroke. He had some pain in the posterior aspect of his neck postoperatively. He has known degenerative changes in his cervical spine. This is now resolved. His swallowing is normal. He has had no changes in his voice. He has no slurred speech. He is currently taking aspirin and Xarelto.  Review of systems: He reports easy fatigue. He denies shortness of breath. He denies chest pain.  Physical exam: Filed Vitals:   06/11/13 1145 06/11/13 1148  BP: 120/50 116/57  Pulse: 59 59  Height: 6' (1.829 m)   Weight: 194 lb (87.998 kg)   SpO2: 96%       Neck: Well-healed right carotid endarterectomy incision, bilateral carotid bruits  Neuro: Upper extremity lower extremity 5/5 motor strength, tongue midline  Carotid duplex exam was performed today. This shows a patent right carotid endarterectomy less than 40% left internal carotid artery stenosis possible left subclavian artery stenosis. I reviewed and interpreted this study.  Assessment: Doing well status post carotid endarterectomy  Plan: Followup12 months carotid duplex exam  Ruta Hinds, MD [facility] Office: [phone] Pager: [phone]

## 2013-06-11 NOTE — Telephone Encounter (Signed)
Form given to Dr. Chase Caller. Chapman Bing, CMA

## 2013-06-11 NOTE — Addendum Note (Signed)
Addended by: Mena Goes on: 06/11/2013 04:10 PM   Modules accepted: Orders

## 2013-06-18 NOTE — Telephone Encounter (Signed)
I think is wiuth Anderson Malta now

## 2013-06-18 NOTE — Telephone Encounter (Signed)
Has this been completed. Thanks.

## 2013-06-22 NOTE — Telephone Encounter (Signed)
Form completed and faxed back. Form placed in scan folder at front. [place] Bing, CMA

## 2013-06-22 NOTE — Telephone Encounter (Signed)
[name],  Please advise if this was done, thanks!

## 2013-06-26 ENCOUNTER — Encounter (HOSPITAL_COMMUNITY): Payer: Managed Care, Other (non HMO)

## 2013-07-02 ENCOUNTER — Encounter: Payer: Self-pay | Admitting: *Deleted

## 2013-07-06 ENCOUNTER — Ambulatory Visit (HOSPITAL_COMMUNITY)
Admission: RE | Admit: 2013-07-06 | Discharge: 2013-07-06 | Disposition: A | Discharge status: Home / Self Care | Payer: Managed Care, Other (non HMO) | Source: Ambulatory Visit | Attending: Internal Medicine | Admitting: Internal Medicine

## 2013-07-06 ENCOUNTER — Encounter (HOSPITAL_COMMUNITY): Payer: Self-pay

## 2013-07-06 ENCOUNTER — Encounter: Payer: Self-pay | Admitting: Anesthesiology

## 2013-07-06 VITALS — BP 120/80 | HR 60 | Wt 191.8 lb

## 2013-07-06 DIAGNOSIS — I2510 Atherosclerotic heart disease of native coronary artery without angina pectoris: Secondary | ICD-10-CM | POA: Insufficient documentation

## 2013-07-06 DIAGNOSIS — I5020 Unspecified systolic (congestive) heart failure: Secondary | ICD-10-CM

## 2013-07-06 DIAGNOSIS — I5022 Chronic systolic (congestive) heart failure: Secondary | ICD-10-CM | POA: Insufficient documentation

## 2013-07-06 DIAGNOSIS — I509 Heart failure, unspecified: Secondary | ICD-10-CM

## 2013-07-06 DIAGNOSIS — I4891 Unspecified atrial fibrillation: Secondary | ICD-10-CM

## 2013-07-06 MED ORDER — LOSARTAN POTASSIUM 25 MG PO TABS: 25.0000 mg | ORAL_TABLET | Freq: Every day | ORAL | Status: DC

## 2013-07-06 NOTE — Progress Notes (Signed)
Patient ID: Trevor Simpson, male   DOB: 1934/01/28, 80 y.o.   MRN: [id number]  PCP: Dr Maudie Mercury Pulmonology: Dr Chase Caller Cardiology: Dr Lovena Le Referring MD: Dr Chase Caller  HPI: Trevor Simpson is a 80 year old with history of  COPD, symptomatic bradycardia, s/p ST Jude PPM, PAF, HTN, PAD, CVA and chronic systolic heart failure EF 20% referred to HF clinic by Dr Chase Caller in 1/15 for further evaluation of CHF and RUL infiltrate.   S/P RHC/LHC 08/2012  RA 12/14 with a mean of 11 mmHg  RV 38/11 mmHg  PA 41/23 with a mean of 29 mmHg  PCWP 20 over 20 with a mean of 15 mmHg  LV 101/13 mmHg  AO 108/52 with a mean of 71 mmHg  Cardiac Output (Fick) 4.3 L per minute  Cardiac Index (Fick) 2.0 L per minute per meter square  Left mainstem: The left main is long with 20% stenosis distally.  Left anterior descending (LAD): The left anterior descending artery is moderately calcified in the proximal vessel. There is diffuse disease in the proximal vessel up to 30%. The first diagonal is relatively small and has mild disease proximally. The ramus intermediate branch is a large branch that bifurcates in the mid vessel. The proximal vessel has a 90-95% stenosis.  Left circumflex (LCx): The left circumflex is relatively small. It gives rise to a single marginal branch and then continues in the AV groove. The first marginal branch has diffuse 60-70% stenosis proximally.  Right coronary artery (RCA): The right coronary is occluded proximally. There are right to right and left to right collaterals. Severe two-vessel obstructive coronary disease. Chronic total occlusion of the right coronary Successful stenting of the ramus intermediate branch with a bare-metal stent   He was treated for RUL pneumonia 12/14. He developed hemoptysis and his PCP stopped Xarelto and placed him on levaquin.  At that time he was referred to pulmonary. [name] Stopped levaquin and started cipro and clindamycin and coumadin. F/u CXR has shown  persistent RUL opacity and there is a question if this might be asymmetric edema.  I saw him in January and felt he had NYHA III-IIIB symptoms. RUL infiltrate felt to be PNA and not HF. Decision was to continue current dose of lasix and proceed with CPX testing once lung issues cleared up. We also briefly discussed the need for possible repeat RHC and consideration of CRT-D and we will discuss further at his next visit. F/u CT chest in 2/15 showed: Near-complete resolution of the right upper lobe patchy opacity from prior CT, now with mild residual post infectious/inflammatory scarring in the right lung apex (series 4/ image 10). Saw Dr. Chase Caller last month and referred to pulmonary rehab.  Follow-up: Says he is feeling ok. Went to his 3rd pulmonary rehab class today. Rode bike for 10 mins and did Nu-Step. Then 10 mins on treadmill. Says it was hard but he got through it. Denies CP. + DOE. Very fatigued after pulmonary rehab. Able to do all ADLs. Denies orthopnea or PND. No significant edema or CP. Weight at home stable ~189. He rarely takes an extra lasix.   09/28/12 AST 27 ALT 21  02/10/13 K 3.8 Creatinine  1.41  02/26/13 Pro BNP 1100  SH: Lives at home with wife FH: Father, Brother - CAD HTN, DM        Sister - lung cancer      Current Outpatient Prescriptions  Medication Sig Dispense Refill  . ALPRAZolam (XANAX) 0.25 MG  tablet Take 0.25 mg by mouth 3 (three) times daily as needed for sleep or anxiety.       Marland Kitchen amiodarone (PACERONE) 200 MG tablet Take 1 tablet (200 mg total) by mouth daily.      Marland Kitchen atorvastatin (LIPITOR) 20 MG tablet Take 20 mg by mouth daily.        . B Complex-C (SUPER B COMPLEX) TABS Take 1 tablet by mouth daily.       . bisoprolol (ZEBETA) 5 MG tablet Take 1 tablet (5 mg total) by mouth daily.  30 tablet  3  . calcium carbonate (OS-CAL) 600 MG TABS Take 600 mg by mouth daily with breakfast.       . cetirizine (ZYRTEC) 10 MG tablet Take 10 mg by mouth daily.       . fish  oil-omega-3 fatty acids 1000 MG capsule Take 1 g by mouth 2 (two) times daily.       . furosemide (LASIX) 80 MG tablet Take 1 tablet (80 mg total) by mouth daily.  90 tablet  3  . levothyroxine (SYNTHROID, LEVOTHROID) 112 MCG tablet Take 112 mcg by mouth daily.      . magnesium gluconate (MAGONATE) 500 MG tablet Take 500 mg by mouth daily.       . Multiple Vitamin (MULTIVITAMIN WITH MINERALS) TABS Take 1 tablet by mouth daily.      . nitroGLYCERIN (NITROSTAT) 0.4 MG SL tablet Place 1 tablet (0.4 mg total) under the tongue every 5 (five) minutes as needed for chest pain.  25 tablet  3  . omeprazole (PRILOSEC) 20 MG capsule Take 20 mg by mouth daily as needed (indigestion).       . Saw Palmetto, Serenoa repens, (SAW PALMETTO PO) Take 1 capsule by mouth 2 (two) times daily.      . tamsulosin (FLOMAX) 0.4 MG CAPS capsule Take 0.4 mg by mouth daily after supper.       . tiotropium (SPIRIVA) 18 MCG inhalation capsule Place 1 capsule (18 mcg total) into inhaler and inhale daily.  30 capsule  6  . vitamin C (ASCORBIC ACID) 500 MG tablet Take 500 mg by mouth daily.        Marland Kitchen warfarin (COUMADIN) 5 MG tablet Take 5 mg by mouth as directed.      . [DISCONTINUED] diltiazem (CARDIZEM CD) 120 MG 24 hr capsule        No current facility-administered medications for this encounter.     Allergies  Allergen Reactions  . Codeine     "tripped out on it"  . Morphine     "tripped out on it"    PHYSICAL EXAM: Filed Vitals:   07/06/13 1355  BP: 120/80  Pulse: 60   General:  Elderly frail appearing. No respiratory difficulty. Wife and daughter in law present.  HEENT: normal Neck: supple. no JVD. Carotids 2+ bilat; no bruits. No lymphadenopathy or thryomegaly appreciated. R neck scar Cor: PMI nondisplaced. Regular rate & rhythm. No rubs, gallops or Trevor 2/6 . Lungs: distant heart sounds. clear Abdomen: soft, nontender, nondistended. No hepatosplenomegaly. No bruits or masses. Good bowel sounds. Extremities:  no cyanosis, clubbing, rash, edema Neuro: alert & oriented x 3, cranial nerves grossly intact. moves all 4 extremities w/o difficulty. Affect pleasant.  ECG: NSR. Non-specific intraventricular conduction delay (LBBB-like) QRS 19ms  No results found for this or any previous visit (from the past 24 hour(s)). No results found.   ASSESSMENT & PLAN: 1. Chronic Systolic Heart Failure  due to iCM EF 20-25%  --Slightly improved by remains NYHA III. Volume status looks good.  --Suspect fatigue is combination of HF, recent PNA and deconditioning but HF likely playing major role at this point.  --Would like to see him continue with rehab (or [employer]) for another 4-6 weeks. If no improvement will proceed with CPX testing to further evaluate. --Add losartan 25 daily. BMET in 1 week. --If CPX shows severe cardiac limitation consider CRT-D upgrade. Will also need repeat echo at next visit.  --Discussed daily weights, low salt food choices, and limiting fluid intake to less than 2 liters per day.    2. PAF S/P DC-CV 09/2012 on amiodarone 200 mg daily. Switched fromt xarelto to coumadin due to hemoptysis in setting of PNA.   3. CAD S/P BMS ramus intermediate branch with a bare-metal stent. No evidence of ischemia.   4. RUL pneumonia - resolved   Jolaine Artist MD 2:13 PM

## 2013-07-06 NOTE — Addendum Note (Signed)
Encounter addended by: Kerry Dory, CMA on: 07/06/2013  2:46 PM<BR>     Documentation filed: Patient Instructions Section, Orders

## 2013-07-06 NOTE — Patient Instructions (Signed)
START Losartan 25 mg daily.  Labs needed in [place] -BMET  Your physician recommends that you schedule a follow-up appointment in: 6 weeks  Your physician has requested that you have an echocardiogram. Echocardiography is a painless test that uses sound waves to create images of your heart. It provides your doctor with information about the size and shape of your heart and how well your heart's chambers and valves are working. This procedure takes approximately one hour. There are no restrictions for this procedure.  Do the following things EVERYDAY: 1) Weigh yourself in the morning before breakfast. Write it down and keep it in a log. 2) Take your medicines as prescribed 3) Eat low salt foods-Limit salt (sodium) to 2000 mg per day.  4) Stay as active as you can everyday 5) Limit all fluids for the day to less than 2 liters 6)

## 2013-07-22 ENCOUNTER — Telehealth: Payer: Self-pay | Admitting: Internal Medicine

## 2013-07-22 ENCOUNTER — Ambulatory Visit (INDEPENDENT_AMBULATORY_CARE_PROVIDER_SITE_OTHER): Payer: Managed Care, Other (non HMO) | Admitting: *Deleted

## 2013-07-22 DIAGNOSIS — I4891 Unspecified atrial fibrillation: Secondary | ICD-10-CM

## 2013-07-22 DIAGNOSIS — I428 Other cardiomyopathies: Secondary | ICD-10-CM

## 2013-07-22 DIAGNOSIS — I429 Cardiomyopathy, unspecified: Secondary | ICD-10-CM

## 2013-07-22 LAB — MDC_IDC_ENUM_SESS_TYPE_INCLINIC
Battery Voltage: 2.89 V
Brady Statistic RA Percent Paced: 92 %
Implantable Pulse Generator Model: 2110
Implantable Pulse Generator Serial Number: 7105776
Lead Channel Impedance Value: 450 Ohm
Lead Channel Pacing Threshold Amplitude: 1.125 V
Lead Channel Pacing Threshold Amplitude: 1.5 V
Lead Channel Pacing Threshold Pulse Width: 0.4 ms
Lead Channel Sensing Intrinsic Amplitude: 10.8 mV
Lead Channel Setting Pacing Amplitude: 1.375
Lead Channel Setting Pacing Amplitude: 3.5 V
Lead Channel Setting Pacing Pulse Width: 0.4 ms
MDC IDC MSMT BATTERY REMAINING LONGEVITY: 27.6 mo
MDC IDC MSMT LEADCHNL RA IMPEDANCE VALUE: 312.5 Ohm
MDC IDC MSMT LEADCHNL RA PACING THRESHOLD AMPLITUDE: 1.5 V
MDC IDC MSMT LEADCHNL RA PACING THRESHOLD PULSEWIDTH: 0.8 ms
MDC IDC MSMT LEADCHNL RA PACING THRESHOLD PULSEWIDTH: 0.8 ms
MDC IDC MSMT LEADCHNL RA SENSING INTR AMPL: 2.1 mV
MDC IDC SESS DTM: 20150506150417
MDC IDC SET LEADCHNL RV SENSING SENSITIVITY: 2 mV
MDC IDC STAT BRADY RV PERCENT PACED: 11 %

## 2013-07-22 NOTE — Progress Notes (Signed)
Pacemaker check in clinic. Normal device function. Thresholds, sensing, impedances consistent with previous measurements. Device programmed to maximize longevity. 70 mode switch, + coumadin.  Total A-fib burden <1%.  No high ventricular rates noted. Device programmed at appropriate safety margins. Histogram distribution blunted, rate response activated today.   Device programmed to optimize intrinsic conduction. Estimated longevity 2.3 years. Patient enrolled in remote follow-up/TTM's with [employer]. Plan to follow every 3 months remotely and see annually in office. Patient education completed.  ROV 09/08/13 with Dr. Lovena Le.

## 2013-07-22 NOTE — Telephone Encounter (Signed)
Left message that his device was checked today and looked good.  His RR was turned on and this should help  The labs should be sent to his PCP

## 2013-07-22 NOTE — Telephone Encounter (Signed)
New message     Pt has been exercising for several days---he has a pacemaker---he has a slow heart rate of 60 even while exercising.  Wife says he feels bad but the patient does not complain.  He never goes over 60-65 heart rate while exercising.  They recommend he has his pacemaker checked.  He will be back at cardiac rehab fri am.  Just wanted to report this to the nurse/Dr Lovena Le.

## 2013-07-22 NOTE — Telephone Encounter (Signed)
Follow up           Office did an A1C and it was a 5.9. Office will fax the results to our office.

## 2013-07-27 ENCOUNTER — Telehealth: Payer: Self-pay | Admitting: Internal Medicine

## 2013-07-27 NOTE — Telephone Encounter (Signed)
New messge     Had pacer checked last week and they changed his settings.  Had therapy last week.  He has felt really bad.  Pt "gives out" real easy since pacermaker check

## 2013-07-27 NOTE — Telephone Encounter (Signed)
Pt seen on 07-22-13 and rate response was turned on. Since then, pt has felt really bad with no energy. Pt requests to have changes back to ppm. Pt scheduled for 5-12 @ 1000 for changes to be made.

## 2013-07-28 ENCOUNTER — Ambulatory Visit (INDEPENDENT_AMBULATORY_CARE_PROVIDER_SITE_OTHER): Payer: Managed Care, Other (non HMO) | Admitting: *Deleted

## 2013-07-28 DIAGNOSIS — I495 Sick sinus syndrome: Secondary | ICD-10-CM

## 2013-07-28 LAB — MDC_IDC_ENUM_SESS_TYPE_INCLINIC
Implantable Pulse Generator Model: 2110
Implantable Pulse Generator Serial Number: 7105776

## 2013-07-28 NOTE — Progress Notes (Signed)
Pt has felt worse since rate response was turned on. Pt unable to exercise yesterday and on Friday had to stop during exercise due to giving out. Rate response turned off and pt to call tomorrow to let know how exercise was. Follow up as planned.

## 2013-08-03 ENCOUNTER — Other Ambulatory Visit: Payer: Self-pay | Admitting: *Deleted

## 2013-08-03 MED ORDER — BISOPROLOL FUMARATE 5 MG PO TABS: 5.0000 mg | ORAL_TABLET | Freq: Every day | ORAL | Status: DC

## 2013-08-04 ENCOUNTER — Encounter: Payer: Self-pay | Admitting: Internal Medicine

## 2013-08-04 ENCOUNTER — Other Ambulatory Visit: Payer: Self-pay | Admitting: Internal Medicine

## 2013-08-18 ENCOUNTER — Encounter (HOSPITAL_COMMUNITY): Payer: Self-pay

## 2013-08-18 ENCOUNTER — Ambulatory Visit (HOSPITAL_COMMUNITY)
Admission: RE | Admit: 2013-08-18 | Discharge: 2013-08-18 | Disposition: A | Discharge status: Home / Self Care | Payer: Medicare HMO | Source: Ambulatory Visit | Attending: Internal Medicine | Admitting: Internal Medicine

## 2013-08-18 ENCOUNTER — Ambulatory Visit (HOSPITAL_BASED_OUTPATIENT_CLINIC_OR_DEPARTMENT_OTHER)
Admission: RE | Admit: 2013-08-18 | Discharge: 2013-08-18 | Disposition: A | Discharge status: Home / Self Care | Payer: Medicare HMO | Source: Ambulatory Visit | Attending: Internal Medicine | Admitting: Internal Medicine

## 2013-08-18 VITALS — BP 110/66 | HR 56 | Wt 190.8 lb

## 2013-08-18 DIAGNOSIS — I509 Heart failure, unspecified: Secondary | ICD-10-CM

## 2013-08-18 DIAGNOSIS — Z950 Presence of cardiac pacemaker: Secondary | ICD-10-CM | POA: Insufficient documentation

## 2013-08-18 DIAGNOSIS — I5020 Unspecified systolic (congestive) heart failure: Secondary | ICD-10-CM

## 2013-08-18 DIAGNOSIS — I059 Rheumatic mitral valve disease, unspecified: Secondary | ICD-10-CM

## 2013-08-18 DIAGNOSIS — I5021 Acute systolic (congestive) heart failure: Secondary | ICD-10-CM

## 2013-08-18 DIAGNOSIS — I4891 Unspecified atrial fibrillation: Secondary | ICD-10-CM | POA: Insufficient documentation

## 2013-08-18 NOTE — Patient Instructions (Signed)
Will set up for cardiopulmonary exercise test.  Follow up 3-4 weeks.  Can hold bisoprolol before exercise.  Do the following things EVERYDAY: 1) Weigh yourself in the morning before breakfast. Write it down and keep it in a log. 2) Take your medicines as prescribed 3) Eat low salt foods-Limit salt (sodium) to 2000 mg per day.  4) Stay as active as you can everyday 5) Limit all fluids for the day to less than 2 liters 6)

## 2013-08-18 NOTE — Progress Notes (Signed)
Patient ID: Trevor Simpson, male   DOB: 08-15-1933, 80 y.o.   MRN: [id number]  PCP: Dr Maudie Mercury Pulmonology: Dr Chase Caller Cardiology: Dr Lovena Le Referring MD: Dr Chase Caller  HPI: Trevor Simpson is a 80 year old with history of  COPD, symptomatic bradycardia, s/p ST Jude PPM, PAF, HTN, PAD, CVA and chronic systolic heart failure EF 20% referred to HF clinic by Dr Chase Caller in 1/15 for further evaluation of CHF and RUL infiltrate.   S/P RHC/LHC 08/2012  RA 12/14 with a mean of 11 mmHg  RV 38/11 mmHg  PA 41/23 with a mean of 29 mmHg  PCWP 20 over 20 with a mean of 15 mmHg  LV 101/13 mmHg  AO 108/52 with a mean of 71 mmHg  Cardiac Output (Fick) 4.3 L per minute  Cardiac Index (Fick) 2.0 L per minute per meter square  Left mainstem: The left main is long with 20% stenosis distally.  Left anterior descending (LAD): The left anterior descending artery is moderately calcified in the proximal vessel. There is diffuse disease in the proximal vessel up to 30%. The first diagonal is relatively small and has mild disease proximally. The ramus intermediate branch is a large branch that bifurcates in the mid vessel. The proximal vessel has a 90-95% stenosis.  Left circumflex (LCx): The left circumflex is relatively small. It gives rise to a single marginal branch and then continues in the AV groove. The first marginal branch has diffuse 60-70% stenosis proximally.  Right coronary artery (RCA): The right coronary is occluded proximally. There are right to right and left to right collaterals. Severe two-vessel obstructive coronary disease. Chronic total occlusion of the right coronary Successful stenting of the ramus intermediate branch with a bare-metal stent  He was treated for RUL pneumonia 12/14. He developed hemoptysis and his PCP stopped Xarelto and placed him on levaquin.  At that time he was referred to pulmonary. [name] Stopped levaquin and started cipro and clindamycin and coumadin. F/u CXR has shown  persistent RUL opacity and there is a question if this might be asymmetric edema.  I saw him in January and felt he had NYHA III-IIIB symptoms. RUL infiltrate felt to be PNA and not HF. Decision was to continue current dose of lasix and proceed with CPX testing once lung issues cleared up. We also briefly discussed the need for possible repeat RHC and consideration of CRT-D and we will discuss further at his next visit. F/u CT chest in 2/15 showed: Near-complete resolution of the right upper lobe patchy opacity from prior CT, now with mild residual post infectious/inflammatory scarring in the right lung apex (series 4/ image 10). Saw Dr. Chase Caller last month and referred to pulmonary rehab.  Follow up for Heart Failure: Reports he has had good days and bad days. Reports since they turned rate response on that he didn't feel well and he had them change it back to his previous settings. Very fatigued and feels like he felt when he went back into afib. Occasional SOB. Has missed a few pulmonary classes. Was able to walk from the parking deck to the desk with no issues. Denies CP. In therapy completing 30 min on 3 different machines. Weight at home 187 lbs. Following a low salt diet and drinking less than 2 L a day. No dizziness.  09/28/12 AST 27 ALT 21  02/10/13 K 3.8 Creatinine  1.41  02/26/13 Pro BNP 1100 07/06/12: K 3.9, creatinine 1.4, BUN 22   SH: Lives at home  with wife FH: Father, Brother - CAD HTN, DM        Sister - lung cancer      Current Outpatient Prescriptions  Medication Sig Dispense Refill  . ALPRAZolam (XANAX) 0.25 MG tablet Take 0.25 mg by mouth 3 (three) times daily as needed for sleep or anxiety.       Marland Kitchen amiodarone (PACERONE) 200 MG tablet Take 1 tablet (200 mg total) by mouth daily.      Marland Kitchen atorvastatin (LIPITOR) 20 MG tablet Take 20 mg by mouth daily.        . B Complex-C (SUPER B COMPLEX) TABS Take 1 tablet by mouth daily.       . bisoprolol (ZEBETA) 5 MG tablet Take 1 tablet  (5 mg total) by mouth daily.  30 tablet  1  . calcium carbonate (OS-CAL) 600 MG TABS Take 600 mg by mouth daily with breakfast.       . cetirizine (ZYRTEC) 10 MG tablet Take 10 mg by mouth daily.       . fish oil-omega-3 fatty acids 1000 MG capsule Take 1 g by mouth 2 (two) times daily.       . furosemide (LASIX) 80 MG tablet Take 1 tablet (80 mg total) by mouth daily.  90 tablet  3  . levothyroxine (SYNTHROID, LEVOTHROID) 112 MCG tablet Take 112 mcg by mouth daily.      Marland Kitchen losartan (COZAAR) 25 MG tablet Take 1 tablet (25 mg total) by mouth daily.  30 tablet  6  . magnesium gluconate (MAGONATE) 500 MG tablet Take 500 mg by mouth daily.       . Multiple Vitamin (MULTIVITAMIN WITH MINERALS) TABS Take 1 tablet by mouth daily.      . nitroGLYCERIN (NITROSTAT) 0.4 MG SL tablet Place 1 tablet (0.4 mg total) under the tongue every 5 (five) minutes as needed for chest pain.  25 tablet  3  . omeprazole (PRILOSEC) 20 MG capsule Take 20 mg by mouth daily as needed (indigestion).       . Saw Palmetto, Serenoa repens, (SAW PALMETTO PO) Take 1 capsule by mouth 2 (two) times daily.      Marland Kitchen tiotropium (SPIRIVA) 18 MCG inhalation capsule Place 1 capsule (18 mcg total) into inhaler and inhale daily.  30 capsule  6  . vitamin C (ASCORBIC ACID) 500 MG tablet Take 500 mg by mouth daily.        Marland Kitchen warfarin (COUMADIN) 5 MG tablet Take 5 mg by mouth as directed.      . [DISCONTINUED] diltiazem (CARDIZEM CD) 120 MG 24 hr capsule        No current facility-administered medications for this encounter.     Allergies  Allergen Reactions  . Codeine     "tripped out on it"  . Morphine     "tripped out on it"    Filed Vitals:   08/18/13 1124  BP: 110/66  Pulse: 56  Weight: 190 lb 12.8 oz (86.546 kg)  SpO2: 98%    PHYSICAL EXAM: General:  Elderly frail appearing. No respiratory difficulty. Wife and daughter in law present.  HEENT: normal Neck: supple. JVP 7; Carotids 2+ bilat; no bruits. No lymphadenopathy or  thryomegaly appreciated. R neck scar Cor: Distant heart sounds. Regular rate & rhythm. No rubs, gallops or Trevor 2/6 . Lungs: distant heart sounds. clear Abdomen: soft, nontender, nondistended. No hepatosplenomegaly. No bruits or masses. Good bowel sounds. Extremities: no cyanosis, clubbing, rash, 1+ bilateral edema Neuro: alert &  oriented x 3, cranial nerves grossly intact. moves all 4 extremities w/o difficulty. Affect pleasant.  No results found for this or any previous visit (from the past 24 hour(s)). No results found.   ASSESSMENT & PLAN:  1. Chronic Systolic Heart Failure: ICM, s/p PPM (no ICD) St. Jude; EF 15%, Tapse 1.4, RV moderately dilated (ECHO 08/2013) - Remains NYHA IIIb symptoms and volume status stable. Will continue lasix 80 mg daily. - Will continue current medications as is and likely need to stop bisoprolol, but want to see CPX results on bisoprolol.  - Dr. Haroldine Laws discussed with patient and family about therapy options: heart transplant (not candidate d/t age), LVAD (likely not candidate d/t age, lungs and RV), home milrinone and/or CRT-D with medications. - Will set patient up for CPX to assess cardiac limitations vs pulmonary limitations. May need RHC afterwards to assess cardiac output and whether he needs home inotrope's.  - Patient has LBBB with wide QRS 142 msec. Will have CPX first and then will discuss about whether we should proceed with CRT-D. Patient currently dose not have ICD.  - Reinforced the need and importance of daily weights, a low sodium diet, and fluid restriction (less than 2 L a day). Instructed to call the HF clinic if weight increases more than 3 lbs overnight or 5 lbs in a week.  2. PAF S/P DC-CV 09/2012 on amiodarone 200 mg daily. Switched fromt xarelto to coumadin due to hemoptysis in setting of PNA. Coumadin managed [facility].  3. CAD S/P BMS ramus intermediate branch with a bare-metal stent. No evidence of ischemia. Continue statin, BB and  ARB. No ASA while on Coumadin. Managed by Dr. Lovena Le 4. RUL pneumonia - resolved    F/U 3-4 weeks  Rande Brunt  NP-C 12:13 PM  Patient seen and examined with Junie Bame, NP. We discussed all aspects of the encounter. I agree with the assessment and plan as stated above. Trevor. Simpson continues to struggle with low output (NYHA IIIB) symptoms even after resolution of his acute PNA. I reviewed his echo today and EF remains markedly reduced. We discussed the options for advanced therapies. Given his age and comorbidities his options are fairly limited. That said, given chronotropic incompetence and LBBB, I do think he may benefit from CRT. Will start with CPX testing to clearly define the severity of his limitation and if there is also a significant respiratory component. We also discussed possibility of IV milrinone in future if needed. He is not candidate for VAD or Tx. All questions answered.   Total time personally spent 45 minutes. Over half that time spent discussing above.   Shaune Pascal Darsi Tien,MD 8:32 PM

## 2013-08-26 ENCOUNTER — Telehealth: Payer: Self-pay | Admitting: Internal Medicine

## 2013-08-26 NOTE — Telephone Encounter (Signed)
New message         Pt states Lasix is not working for him anymore / Its not making him urinate frequently like before

## 2013-08-26 NOTE — Telephone Encounter (Signed)
Spoke w/pt he states he doesn't feel like Lasix is really working for him anymore, he does not urinate as much as he normally does, his wt is only up a couple of lbs, he was 190 today, he states usually runs 187-189, he states he does have some slight LE edema but its the same as it was last week.  He does report more SOB with activity, moving around his house.  He states that yesterday his BP was 80/50 and he felt bad and weak most of day, he does feel better today and BP was 115/60 but he is still SOB.  He states that he took an extra 40 mg of Lasix this AM to equal a total of 120 mg, pt will take an extra 40 mg of Lasix now and call in the AM with update

## 2013-08-27 NOTE — Telephone Encounter (Signed)
Spoke w/pt earlier today, wt is 189.2 down 1 lb from yesterday and BP is 91/65 today he is still very SOB with activity.  Discussed all with Dr Haroldine Laws, he is concerned about pt's low output he would like pt to take 40 mg Lasix tonight an dhave right heart cath on Monday, if worse over weekend go to ER, pt is aware and agreeable, cath explained to pt along with possibility of milrinone.  Will call pt tomorrow with time for cath

## 2013-08-28 ENCOUNTER — Telehealth (HOSPITAL_COMMUNITY): Payer: Self-pay

## 2013-08-28 ENCOUNTER — Telehealth (HOSPITAL_COMMUNITY): Payer: Self-pay | Admitting: Cardiology

## 2013-08-28 ENCOUNTER — Other Ambulatory Visit (HOSPITAL_COMMUNITY): Payer: Self-pay | Admitting: Adult Health

## 2013-08-28 DIAGNOSIS — I5022 Chronic systolic (congestive) heart failure: Secondary | ICD-10-CM

## 2013-08-28 NOTE — Telephone Encounter (Signed)
Patient called to confirm about RHC on Monday.  States INR was 4.5 yesterday and was instructed to eat a lot of greens and hold coumadin for next two days.  Will add on INR level morning of cath to ensure this stabilizes INR level.  Renee Pain

## 2013-08-28 NOTE — Telephone Encounter (Signed)
Pt scheduled for Bowers cath 08/31/13 Cpt code- 93453 icd9 code- 428.22 With pts current insurance pre cert case # [id number] Once eligibility clears for provider ok to proceed with pre cert.

## 2013-08-31 ENCOUNTER — Encounter (HOSPITAL_COMMUNITY): Payer: Self-pay | Admitting: Pharmacy Technician

## 2013-08-31 ENCOUNTER — Inpatient Hospital Stay (HOSPITAL_COMMUNITY)
Admission: RE | Admit: 2013-08-31 | Discharge: 2013-09-02 | DRG: 286 | Disposition: A | Discharge status: Home Health | Payer: Medicare HMO | Source: Ambulatory Visit | Attending: Internal Medicine | Admitting: Internal Medicine

## 2013-08-31 ENCOUNTER — Other Ambulatory Visit: Payer: Self-pay

## 2013-08-31 ENCOUNTER — Encounter (HOSPITAL_COMMUNITY)
Admission: RE | Disposition: A | Discharge status: Home Health | Payer: Self-pay | Source: Ambulatory Visit | Attending: Internal Medicine

## 2013-08-31 DIAGNOSIS — M129 Arthropathy, unspecified: Secondary | ICD-10-CM | POA: Diagnosis present

## 2013-08-31 DIAGNOSIS — E039 Hypothyroidism, unspecified: Secondary | ICD-10-CM | POA: Diagnosis present

## 2013-08-31 DIAGNOSIS — Z7901 Long term (current) use of anticoagulants: Secondary | ICD-10-CM

## 2013-08-31 DIAGNOSIS — I509 Heart failure, unspecified: Secondary | ICD-10-CM | POA: Diagnosis present

## 2013-08-31 DIAGNOSIS — I10 Essential (primary) hypertension: Secondary | ICD-10-CM | POA: Diagnosis present

## 2013-08-31 DIAGNOSIS — Z9861 Coronary angioplasty status: Secondary | ICD-10-CM

## 2013-08-31 DIAGNOSIS — Z8673 Personal history of transient ischemic attack (TIA), and cerebral infarction without residual deficits: Secondary | ICD-10-CM

## 2013-08-31 DIAGNOSIS — Z79899 Other long term (current) drug therapy: Secondary | ICD-10-CM

## 2013-08-31 DIAGNOSIS — I252 Old myocardial infarction: Secondary | ICD-10-CM

## 2013-08-31 DIAGNOSIS — J449 Chronic obstructive pulmonary disease, unspecified: Secondary | ICD-10-CM | POA: Diagnosis present

## 2013-08-31 DIAGNOSIS — I5023 Acute on chronic systolic (congestive) heart failure: Principal | ICD-10-CM | POA: Diagnosis present

## 2013-08-31 DIAGNOSIS — Z950 Presence of cardiac pacemaker: Secondary | ICD-10-CM

## 2013-08-31 DIAGNOSIS — I5022 Chronic systolic (congestive) heart failure: Secondary | ICD-10-CM

## 2013-08-31 DIAGNOSIS — I2589 Other forms of chronic ischemic heart disease: Secondary | ICD-10-CM | POA: Diagnosis present

## 2013-08-31 DIAGNOSIS — E785 Hyperlipidemia, unspecified: Secondary | ICD-10-CM | POA: Diagnosis present

## 2013-08-31 DIAGNOSIS — I4891 Unspecified atrial fibrillation: Secondary | ICD-10-CM | POA: Diagnosis present

## 2013-08-31 DIAGNOSIS — I5020 Unspecified systolic (congestive) heart failure: Secondary | ICD-10-CM

## 2013-08-31 DIAGNOSIS — I447 Left bundle-branch block, unspecified: Secondary | ICD-10-CM | POA: Diagnosis present

## 2013-08-31 DIAGNOSIS — R570 Cardiogenic shock: Secondary | ICD-10-CM | POA: Diagnosis present

## 2013-08-31 DIAGNOSIS — K219 Gastro-esophageal reflux disease without esophagitis: Secondary | ICD-10-CM | POA: Diagnosis present

## 2013-08-31 DIAGNOSIS — I739 Peripheral vascular disease, unspecified: Secondary | ICD-10-CM | POA: Diagnosis present

## 2013-08-31 DIAGNOSIS — J4489 Other specified chronic obstructive pulmonary disease: Secondary | ICD-10-CM | POA: Diagnosis present

## 2013-08-31 DIAGNOSIS — I5021 Acute systolic (congestive) heart failure: Secondary | ICD-10-CM

## 2013-08-31 DIAGNOSIS — Z87891 Personal history of nicotine dependence: Secondary | ICD-10-CM

## 2013-08-31 DIAGNOSIS — I2510 Atherosclerotic heart disease of native coronary artery without angina pectoris: Secondary | ICD-10-CM | POA: Diagnosis present

## 2013-08-31 HISTORY — PX: RIGHT HEART CATHETERIZATION: SHX5447

## 2013-08-31 LAB — POCT I-STAT 3, VENOUS BLOOD GAS (G3P V)
ACID-BASE EXCESS: 1 mmol/L (ref 0.0–2.0)
Acid-Base Excess: 2 mmol/L (ref 0.0–2.0)
Acid-Base Excess: 3 mmol/L — ABNORMAL HIGH (ref 0.0–2.0)
Bicarbonate: 26.3 mEq/L — ABNORMAL HIGH (ref 20.0–24.0)
Bicarbonate: 27.4 mEq/L — ABNORMAL HIGH (ref 20.0–24.0)
Bicarbonate: 27.7 mEq/L — ABNORMAL HIGH (ref 20.0–24.0)
O2 SAT: 46 %
O2 SAT: 49 %
O2 Saturation: 55 %
PH VEN: 7.397 — AB (ref 7.250–7.300)
TCO2: 29 mmol/L (ref 0–100)
TCO2: 29 mmol/L (ref 0–100)
TCO2: 28 mmol/L (ref 0–100)
pCO2, Ven: 44.1 mmHg — ABNORMAL LOW (ref 45.0–50.0)
pCO2, Ven: 44.2 mmHg — ABNORMAL LOW (ref 45.0–50.0)
pCO2, Ven: 44.5 mmHg — ABNORMAL LOW (ref 45.0–50.0)
pH, Ven: 7.384 — ABNORMAL HIGH (ref 7.250–7.300)
pH, Ven: 7.405 — ABNORMAL HIGH (ref 7.250–7.300)
pO2, Ven: 25 mmHg — CL (ref 30.0–45.0)
pO2, Ven: 27 mmHg — CL (ref 30.0–45.0)
pO2, Ven: 29 mmHg — CL (ref 30.0–45.0)

## 2013-08-31 LAB — PROTIME-INR
INR: 2.11 — ABNORMAL HIGH (ref 0.00–1.49)
Prothrombin Time: 23 seconds — ABNORMAL HIGH (ref 11.6–15.2)

## 2013-08-31 LAB — CBC
HCT: 41.2 % (ref 39.0–52.0)
Hemoglobin: 12.9 g/dL — ABNORMAL LOW (ref 13.0–17.0)
MCH: 28.7 pg (ref 26.0–34.0)
MCHC: 31.3 g/dL (ref 30.0–36.0)
MCV: 91.8 fL (ref 78.0–100.0)
PLATELETS: 126 10*3/uL — AB (ref 150–400)
RBC: 4.49 MIL/uL (ref 4.22–5.81)
RDW: 16.1 % — ABNORMAL HIGH (ref 11.5–15.5)
WBC: 6.4 10*3/uL (ref 4.0–10.5)

## 2013-08-31 LAB — BASIC METABOLIC PANEL
BUN: 32 mg/dL — ABNORMAL HIGH (ref 6–23)
CHLORIDE: 99 meq/L (ref 96–112)
CO2: 28 mEq/L (ref 19–32)
Calcium: 9.3 mg/dL (ref 8.4–10.5)
Creatinine, Ser: 1.84 mg/dL — ABNORMAL HIGH (ref 0.50–1.35)
GFR calc non Af Amer: 33 mL/min — ABNORMAL LOW (ref >90)
GFR, EST AFRICAN AMERICAN: 39 mL/min — AB (ref >90)
Glucose, Bld: 107 mg/dL — ABNORMAL HIGH (ref 70–99)
Potassium: 3.7 mEq/L (ref 3.7–5.3)
SODIUM: 140 meq/L (ref 137–147)

## 2013-08-31 LAB — MRSA PCR SCREENING: MRSA by PCR: NEGATIVE

## 2013-08-31 SURGERY — RIGHT HEART CATH
Anesthesia: LOCAL

## 2013-08-31 MED ORDER — SODIUM CHLORIDE 0.9 % IJ SOLN
3.0000 mL | Freq: Two times a day (BID) | INTRAMUSCULAR | Status: DC
  Administered 2013-08-31 – 2013-09-01 (×3): 3 mL via INTRAVENOUS

## 2013-08-31 MED ORDER — HEPARIN (PORCINE) IN NACL 2-0.9 UNIT/ML-% IJ SOLN
INTRAMUSCULAR | Status: AC
  Filled 2013-08-31: qty 1000

## 2013-08-31 MED ORDER — WARFARIN - PHARMACIST DOSING INPATIENT: Freq: Every day | Status: DC

## 2013-08-31 MED ORDER — WARFARIN SODIUM 4 MG PO TABS
4.0000 mg | ORAL_TABLET | Freq: Every day | ORAL | Status: DC
  Administered 2013-08-31 – 2013-09-01 (×2): 4 mg via ORAL
  Filled 2013-08-31 (×3): qty 1

## 2013-08-31 MED ORDER — MILRINONE IN DEXTROSE 20 MG/100ML IV SOLN
0.2500 ug/kg/min | INTRAVENOUS | Status: DC
  Administered 2013-08-31: 0.25 ug/kg/min via INTRAVENOUS
  Filled 2013-08-31 (×2): qty 100

## 2013-08-31 MED ORDER — SODIUM CHLORIDE 0.9 % IV SOLN
INTRAVENOUS | Status: DC
  Administered 2013-08-31: 13:00:00 via INTRAVENOUS

## 2013-08-31 MED ORDER — MILRINONE IN DEXTROSE 20 MG/100ML IV SOLN
0.2500 ug/kg/min | INTRAVENOUS | Status: DC
  Administered 2013-08-31 – 2013-09-01 (×2): 0.25 ug/kg/min via INTRAVENOUS
  Filled 2013-08-31 (×3): qty 100

## 2013-08-31 MED ORDER — SODIUM CHLORIDE 0.9 % IJ SOLN: 3.0000 mL | Freq: Two times a day (BID) | INTRAMUSCULAR | Status: DC

## 2013-08-31 MED ORDER — ATORVASTATIN CALCIUM 20 MG PO TABS
20.0000 mg | ORAL_TABLET | Freq: Every day | ORAL | Status: DC
  Administered 2013-08-31 – 2013-09-01 (×2): 20 mg via ORAL
  Filled 2013-08-31 (×3): qty 1

## 2013-08-31 MED ORDER — ASPIRIN 81 MG PO CHEW
81.0000 mg | CHEWABLE_TABLET | ORAL | Status: AC
  Administered 2013-08-31: 81 mg via ORAL
  Filled 2013-08-31: qty 1

## 2013-08-31 MED ORDER — AMIODARONE HCL 200 MG PO TABS
200.0000 mg | ORAL_TABLET | Freq: Every day | ORAL | Status: DC
  Administered 2013-08-31 – 2013-09-02 (×3): 200 mg via ORAL
  Filled 2013-08-31 (×3): qty 1

## 2013-08-31 MED ORDER — TAMSULOSIN HCL 0.4 MG PO CAPS
0.4000 mg | ORAL_CAPSULE | Freq: Every day | ORAL | Status: DC
  Administered 2013-08-31 – 2013-09-01 (×2): 0.4 mg via ORAL
  Filled 2013-08-31 (×3): qty 1

## 2013-08-31 MED ORDER — LEVOTHYROXINE SODIUM 112 MCG PO TABS
112.0000 ug | ORAL_TABLET | Freq: Every day | ORAL | Status: DC
Start: 2013-09-01 — End: 2013-09-02
  Administered 2013-09-01 – 2013-09-02 (×2): 112 ug via ORAL
  Filled 2013-08-31 (×3): qty 1

## 2013-08-31 MED ORDER — SODIUM CHLORIDE 0.9 % IJ SOLN: 3.0000 mL | INTRAMUSCULAR | Status: DC | PRN

## 2013-08-31 MED ORDER — FENTANYL CITRATE 0.05 MG/ML IJ SOLN
INTRAMUSCULAR | Status: AC
  Filled 2013-08-31: qty 2

## 2013-08-31 MED ORDER — SODIUM CHLORIDE 0.9 % IV SOLN: 250.0000 mL | INTRAVENOUS | Status: DC | PRN

## 2013-08-31 MED ORDER — MIDAZOLAM HCL 2 MG/2ML IJ SOLN
INTRAMUSCULAR | Status: AC
  Filled 2013-08-31: qty 2

## 2013-08-31 MED ORDER — LIDOCAINE HCL (PF) 1 % IJ SOLN
INTRAMUSCULAR | Status: AC
  Filled 2013-08-31: qty 30

## 2013-08-31 NOTE — H&P (View-Only) (Signed)
Patient ID: Trevor Simpson, male   DOB: 1934/02/13, 80 y.o.   MRN: [id number]  PCP: Dr Maudie Mercury Pulmonology: Dr Chase Caller Cardiology: Dr Lovena Le Referring MD: Dr Chase Caller  HPI: [name] is a 80 year old with history of  COPD, symptomatic bradycardia, s/p ST Jude PPM, PAF, HTN, PAD, CVA and chronic systolic heart failure EF 20% referred to HF clinic by Dr Chase Caller in 1/15 for further evaluation of CHF and RUL infiltrate.   S/P RHC/LHC 08/2012  RA 12/14 with a mean of 11 mmHg  RV 38/11 mmHg  PA 41/23 with a mean of 29 mmHg  PCWP 20 over 20 with a mean of 15 mmHg  LV 101/13 mmHg  AO 108/52 with a mean of 71 mmHg  Cardiac Output (Fick) 4.3 L per minute  Cardiac Index (Fick) 2.0 L per minute per meter square  Left mainstem: The left main is long with 20% stenosis distally.  Left anterior descending (LAD): The left anterior descending artery is moderately calcified in the proximal vessel. There is diffuse disease in the proximal vessel up to 30%. The first diagonal is relatively small and has mild disease proximally. The ramus intermediate branch is a large branch that bifurcates in the mid vessel. The proximal vessel has a 90-95% stenosis.  Left circumflex (LCx): The left circumflex is relatively small. It gives rise to a single marginal branch and then continues in the AV groove. The first marginal branch has diffuse 60-70% stenosis proximally.  Right coronary artery (RCA): The right coronary is occluded proximally. There are right to right and left to right collaterals. Severe two-vessel obstructive coronary disease. Chronic total occlusion of the right coronary Successful stenting of the ramus intermediate branch with a bare-metal stent  He was treated for RUL pneumonia 12/14. He developed hemoptysis and his PCP stopped Xarelto and placed him on levaquin.  At that time he was referred to pulmonary. [name] Stopped levaquin and started cipro and clindamycin and coumadin. F/u CXR has shown  persistent RUL opacity and there is a question if this might be asymmetric edema.  I saw him in January and felt he had NYHA III-IIIB symptoms. RUL infiltrate felt to be PNA and not HF. Decision was to continue current dose of lasix and proceed with CPX testing once lung issues cleared up. We also briefly discussed the need for possible repeat RHC and consideration of CRT-D and we will discuss further at his next visit. F/u CT chest in 2/15 showed: Near-complete resolution of the right upper lobe patchy opacity from prior CT, now with mild residual post infectious/inflammatory scarring in the right lung apex (series 4/ image 10). Saw Dr. Chase Caller last month and referred to pulmonary rehab.  Follow up for Heart Failure: Reports he has had good days and bad days. Reports since they turned rate response on that he didn't feel well and he had them change it back to his previous settings. Very fatigued and feels like he felt when he went back into afib. Occasional SOB. Has missed a few pulmonary classes. Was able to walk from the parking deck to the desk with no issues. Denies CP. In therapy completing 30 min on 3 different machines. Weight at home 187 lbs. Following a low salt diet and drinking less than 2 L a day. No dizziness.  09/28/12 AST 27 ALT 21  02/10/13 K 3.8 Creatinine  1.41  02/26/13 Pro BNP 1100 07/06/12: K 3.9, creatinine 1.4, BUN 22   SH: Lives at home  with wife FH: Father, Brother - CAD HTN, DM        Sister - lung cancer      Current Outpatient Prescriptions  Medication Sig Dispense Refill  . ALPRAZolam (XANAX) 0.25 MG tablet Take 0.25 mg by mouth 3 (three) times daily as needed for sleep or anxiety.       Marland Kitchen amiodarone (PACERONE) 200 MG tablet Take 1 tablet (200 mg total) by mouth daily.      Marland Kitchen atorvastatin (LIPITOR) 20 MG tablet Take 20 mg by mouth daily.        . B Complex-C (SUPER B COMPLEX) TABS Take 1 tablet by mouth daily.       . bisoprolol (ZEBETA) 5 MG tablet Take 1 tablet  (5 mg total) by mouth daily.  30 tablet  1  . calcium carbonate (OS-CAL) 600 MG TABS Take 600 mg by mouth daily with breakfast.       . cetirizine (ZYRTEC) 10 MG tablet Take 10 mg by mouth daily.       . fish oil-omega-3 fatty acids 1000 MG capsule Take 1 g by mouth 2 (two) times daily.       . furosemide (LASIX) 80 MG tablet Take 1 tablet (80 mg total) by mouth daily.  90 tablet  3  . levothyroxine (SYNTHROID, LEVOTHROID) 112 MCG tablet Take 112 mcg by mouth daily.      Marland Kitchen losartan (COZAAR) 25 MG tablet Take 1 tablet (25 mg total) by mouth daily.  30 tablet  6  . magnesium gluconate (MAGONATE) 500 MG tablet Take 500 mg by mouth daily.       . Multiple Vitamin (MULTIVITAMIN WITH MINERALS) TABS Take 1 tablet by mouth daily.      . nitroGLYCERIN (NITROSTAT) 0.4 MG SL tablet Place 1 tablet (0.4 mg total) under the tongue every 5 (five) minutes as needed for chest pain.  25 tablet  3  . omeprazole (PRILOSEC) 20 MG capsule Take 20 mg by mouth daily as needed (indigestion).       . Saw Palmetto, Serenoa repens, (SAW PALMETTO PO) Take 1 capsule by mouth 2 (two) times daily.      Marland Kitchen tiotropium (SPIRIVA) 18 MCG inhalation capsule Place 1 capsule (18 mcg total) into inhaler and inhale daily.  30 capsule  6  . vitamin C (ASCORBIC ACID) 500 MG tablet Take 500 mg by mouth daily.        Marland Kitchen warfarin (COUMADIN) 5 MG tablet Take 5 mg by mouth as directed.      . [DISCONTINUED] diltiazem (CARDIZEM CD) 120 MG 24 hr capsule        No current facility-administered medications for this encounter.     Allergies  Allergen Reactions  . Codeine     "tripped out on it"  . Morphine     "tripped out on it"    Filed Vitals:   08/18/13 1124  BP: 110/66  Pulse: 56  Weight: 190 lb 12.8 oz (86.546 kg)  SpO2: 98%    PHYSICAL EXAM: General:  Elderly frail appearing. No respiratory difficulty. Wife and daughter in law present.  HEENT: normal Neck: supple. JVP 7; Carotids 2+ bilat; no bruits. No lymphadenopathy or  thryomegaly appreciated. R neck scar Cor: Distant heart sounds. Regular rate & rhythm. No rubs, gallops or MR 2/6 . Lungs: distant heart sounds. clear Abdomen: soft, nontender, nondistended. No hepatosplenomegaly. No bruits or masses. Good bowel sounds. Extremities: no cyanosis, clubbing, rash, 1+ bilateral edema Neuro: alert &  oriented x 3, cranial nerves grossly intact. moves all 4 extremities w/o difficulty. Affect pleasant.  No results found for this or any previous visit (from the past 24 hour(s)). No results found.   ASSESSMENT & PLAN:  1. Chronic Systolic Heart Failure: ICM, s/p PPM (no ICD) St. Jude; EF 15%, Tapse 1.4, RV moderately dilated (ECHO 08/2013) - Remains NYHA IIIb symptoms and volume status stable. Will continue lasix 80 mg daily. - Will continue current medications as is and likely need to stop bisoprolol, but want to see CPX results on bisoprolol.  - Dr. Haroldine Laws discussed with patient and family about therapy options: heart transplant (not candidate d/t age), LVAD (likely not candidate d/t age, lungs and RV), home milrinone and/or CRT-D with medications. - Will set patient up for CPX to assess cardiac limitations vs pulmonary limitations. May need RHC afterwards to assess cardiac output and whether he needs home inotrope's.  - Patient has LBBB with wide QRS 142 msec. Will have CPX first and then will discuss about whether we should proceed with CRT-D. Patient currently dose not have ICD.  - Reinforced the need and importance of daily weights, a low sodium diet, and fluid restriction (less than 2 L a day). Instructed to call the HF clinic if weight increases more than 3 lbs overnight or 5 lbs in a week.  2. PAF S/P DC-CV 09/2012 on amiodarone 200 mg daily. Switched fromt xarelto to coumadin due to hemoptysis in setting of PNA. Coumadin managed [facility].  3. CAD S/P BMS ramus intermediate branch with a bare-metal stent. No evidence of ischemia. Continue statin, BB and  ARB. No ASA while on Coumadin. Managed by Dr. Lovena Le 4. RUL pneumonia - resolved    F/U 3-4 weeks  Rande Brunt  NP-C 12:13 PM  Patient seen and examined with Junie Bame, NP. We discussed all aspects of the encounter. I agree with the assessment and plan as stated above. Mr. Sivley continues to struggle with low output (NYHA IIIB) symptoms even after resolution of his acute PNA. I reviewed his echo today and EF remains markedly reduced. We discussed the options for advanced therapies. Given his age and comorbidities his options are fairly limited. That said, given chronotropic incompetence and LBBB, I do think he may benefit from CRT. Will start with CPX testing to clearly define the severity of his limitation and if there is also a significant respiratory component. We also discussed possibility of IV milrinone in future if needed. He is not candidate for VAD or Tx. All questions answered.   Total time personally spent 45 minutes. Over half that time spent discussing above.   Shaune Pascal Elanah Osmanovic,MD 8:32 PM

## 2013-08-31 NOTE — Telephone Encounter (Signed)
Cath was sch see phone note 6/12

## 2013-08-31 NOTE — Interval H&P Note (Signed)
History and Physical Interval Note:  08/31/2013 11:26 AM  Dalia Heading  has presented today for surgery, with the diagnosis of hf  The various methods of treatment have been discussed with the patient and family. After consideration of risks, benefits and other options for treatment, the patient has consented to  Procedure(s): RIGHT HEART CATH (N/A) as a surgical intervention .  The patient's history has been reviewed, patient examined, no change in status, stable for surgery.  I have reviewed the patient's chart and labs.  Questions were answered to the patient's satisfaction.     Agnes Probert

## 2013-08-31 NOTE — H&P (Signed)
Advanced Heart Failure Team History and Physical Note    HPI:    Trevor Simpson is a 80 year old with history of COPD, symptomatic bradycardia, s/p ST Jude PPM, PAF, HTN, PAD, CVA and chronic systolic heart failure EF 20% being admitted from cath lab after RHC showed low output physiology.   Has had progressive HF symptoms for 1-2 months despite close f/u in HF Clinic. Seen recently and set-up for RHC today.   Cath showed:   RA = 11  RV = 56/5/12  PA = 53/24 (36)  PCW = 22 ( v = 35)  Fick cardiac output/index = 3.7/1.8  Thermo CO/CI = 3.0/1.5  PVR = 4.7 Woods  FA sat = 90%  PA sat = 49%, 46%  Admitted for milrinone  Review of Systems: [y] = yes, [ ]  = no   General: Weight gain [ ] ; Weight loss [ ] ; Anorexia [ y]; Fatigue [ y]; Fever [ ] ; Chills [ ] ; Weakness [ ]   Cardiac: Chest pain/pressure [ ] ; Resting SOB [ ] ; Exertional SOB [ y]; Orthopnea [ ] ; Pedal Edema [ ] ; Palpitations [ ] ; Syncope [ ] ; Presyncope [ ] ; Paroxysmal nocturnal dyspnea[ ]   Pulmonary: Cough [ ] ; Wheezing[ ] ; Hemoptysis[ ] ; Sputum [ ] ; Snoring [ ]   GI: Vomiting[ ] ; Dysphagia[ ] ; Melena[ ] ; Hematochezia [ ] ; Heartburn[ ] ; Abdominal pain [ ] ; Constipation [ ] ; Diarrhea [ ] ; BRBPR [ ]   GU: Hematuria[ ] ; Dysuria [ ] ; Nocturia[ ]   Vascular: Pain in legs with walking [ ] ; Pain in feet with lying flat [ ] ; Non-healing sores [ ] ; Stroke [ ] ; TIA [ ] ; Slurred speech [ ] ;  Neuro: Headaches[ ] ; Vertigo[ ] ; Seizures[ ] ; Paresthesias[ ] ;Blurred vision [ ] ; Diplopia [ ] ; Vision changes [ ]   Ortho/Skin: Arthritis Blue.Reese ]; Joint pain [ y]; Muscle pain [ ] ; Joint swelling [ ] ; Back Pain [ ] ; Rash [ ]   Psych: Depression[ ] ; Anxiety[ ]   Heme: Bleeding problems [ ] ; Clotting disorders [ ] ; Anemia [ ]   Endocrine: Diabetes [ ] ; Thyroid dysfunction[ ]   Home Medications Prior to Admission medications   Medication Sig Start Date End Date Taking? Authorizing Provider  ALPRAZolam (XANAX) 0.25 MG tablet Take 0.25 mg by mouth 3 (three) times  daily as needed for sleep or anxiety.  10/11/12  Yes Historical Provider, MD  amiodarone (PACERONE) 200 MG tablet Take 1 tablet (200 mg total) by mouth daily. 10/14/12  Yes Burtis Junes, NP  atorvastatin (LIPITOR) 20 MG tablet Take 20 mg by mouth daily at 6 PM.    Yes Historical Provider, MD  B Complex-C (SUPER B COMPLEX) TABS Take 1 tablet by mouth daily.    Yes Historical Provider, MD  bisoprolol (ZEBETA) 5 MG tablet Take 1 tablet (5 mg total) by mouth daily. 08/03/13  Yes Jolaine Artist, MD  calcium carbonate (OS-CAL) 600 MG TABS Take 600 mg by mouth daily with breakfast.    Yes Historical Provider, MD  cetirizine (ZYRTEC) 10 MG tablet Take 10 mg by mouth daily.  08/24/10  Yes Historical Provider, MD  fish oil-omega-3 fatty acids 1000 MG capsule Take 1 g by mouth 2 (two) times daily.    Yes Historical Provider, MD  furosemide (LASIX) 80 MG tablet Take 40-80 mg by mouth 2 (two) times daily. Take 80mg  in the morning and take 40mg  in the evening   Yes Historical Provider, MD  levothyroxine (SYNTHROID, LEVOTHROID) 112 MCG tablet Take 112 mcg by mouth daily.  Yes Historical Provider, MD  losartan (COZAAR) 25 MG tablet Take 1 tablet (25 mg total) by mouth daily. 07/06/13  Yes Jolaine Artist, MD  magnesium gluconate (MAGONATE) 500 MG tablet Take 500 mg by mouth daily.    Yes Historical Provider, MD  Multiple Vitamin (MULTIVITAMIN WITH MINERALS) TABS Take 1 tablet by mouth daily.   Yes Historical Provider, MD  nitroGLYCERIN (NITROSTAT) 0.4 MG SL tablet Place 1 tablet (0.4 mg total) under the tongue every 5 (five) minutes as needed for chest pain. 01/15/12  Yes Evans Lance, MD  omeprazole (PRILOSEC) 20 MG capsule Take 20 mg by mouth daily as needed (indigestion).    Yes Historical Provider, MD  Saw Palmetto, Serenoa repens, (SAW PALMETTO PO) Take 1 capsule by mouth 2 (two) times daily.   Yes Historical Provider, MD  tamsulosin (FLOMAX) 0.4 MG CAPS capsule Take 0.4 mg by mouth daily after supper.   08/19/13  Yes Historical Provider, MD  vitamin C (ASCORBIC ACID) 500 MG tablet Take 500 mg by mouth daily.     Yes Historical Provider, MD  warfarin (COUMADIN) 4 MG tablet Take 4 mg by mouth daily.   Yes Historical Provider, MD    Past Medical History: Past Medical History  Diagnosis Date  . Coronary artery disease   . Other and unspecified hyperlipidemia   . Hypertension   . Atrial fibrillation   . Sinoatrial node dysfunction   . PVD (peripheral vascular disease)     s/p renal artery stent 20-04  . Pacemaker st Jude   . AAA (abdominal aortic aneurysm)     repaired  . Systolic congestive heart failure   . Cardiomyopathy   . Carotid artery occlusion   . Atrial fibrillation   . Myocardial infarction   . Stroke     left sided affected no residual effects  . GERD (gastroesophageal reflux disease)   . Arthritis   . Hypothyroidism     Past Surgical History: Past Surgical History  Procedure Laterality Date  . Inguinal hernia repair      left  . Cardiac pacemaker placement  08/01/2009    st jude dual chamber  . Colonoscopy    . Esophagogastroduodenoscopy    . Translumminal angioplasty    . Tee without cardioversion N/A 09/23/2012    Procedure: TRANSESOPHAGEAL ECHOCARDIOGRAM (TEE);  Surgeon: Lelon Perla, MD;  Location: [facility] ENDOSCOPY;  Service: Cardiovascular;  Laterality: N/A;  . Cardioversion N/A 09/23/2012    Procedure: CARDIOVERSION;  Surgeon: Lelon Perla, MD;  Location: [facility] ENDOSCOPY;  Service: Cardiovascular;  Laterality: N/A;  . Renal artery stent    . Abdominal aortic aneurysm repair    . Cardiac catheterization  08/2012    Stents  . Endarterectomy Right 11/25/2012    Procedure: ENDARTERECTOMY CAROTID With Dacron Patch Angioplasty;  Surgeon: Elam Dutch, MD;  Location[facility] OR;  Service: Vascular;  Laterality: Right;  . Carotid endarterectomy Right 11-25-12    Family History: Family History  Problem Relation Age of Onset  . CAD Father   . Hypertension Father    . Heart disease Father   . CAD Brother   . Heart disease Brother   . Heart attack Brother   . Diabetes Brother   . Lung cancer Sister   . Heart disease Sister   . Hypertension Sister   . Hypertension Mother   . Hypertension Daughter   . Cancer Son     Social History: History   Social History  .  Marital Status: Married    Spouse Name: N/A    Number of Children: N/A  . Years of Education: N/A   Social History Main Topics  . Smoking status: Former Smoker -- 1.00 packs/day for 40 years    Types: Cigarettes    Quit date: 03/19/1993  . Smokeless tobacco: Current User    Types: Chew  . Alcohol Use: No  . Drug Use: No  . Sexual Activity: Not Currently   Other Topics Concern  . Not on file   Social History Narrative  . No narrative on file    Allergies:  Allergies  Allergen Reactions  . Codeine     "tripped out on it"  . Morphine     "tripped out on it"    Objective:    Vital Signs:   Temp:  [96.1 F (35.6 C)-97.8 F (36.6 C)] 96.1 F (35.6 C) (06/15 1600) Pulse Rate:  [30-61] 59 (06/15 1600) Resp:  [11-18] 14 (06/15 1600) BP: (108-129)/(47-75) 111/57 mmHg (06/15 1600) SpO2:  [92 %-97 %] 94 % (06/15 1600) Weight:  [86.183 kg (190 lb)] 86.183 kg (190 lb) (06/15 0836)   Filed Weights   08/31/13 0836  Weight: 86.183 kg (190 lb)    PHYSICAL EXAM:  General: Elderly frail appearing. No respiratory difficulty. Wife and daughter in law present.  HEENT: normal  Neck: RIJ swan supple.; Carotids 2+ bilat; no bruits. No lymphadenopathy or thryomegaly appreciated. R neck scar  Cor: Distant heart sounds. Regular rate & rhythm. No rubs, gallops or Trevor 2/6 .  Lungs: distant heart sounds. clear  Abdomen: soft, nontender, nondistended. No hepatosplenomegaly. No bruits or masses. Good bowel sounds.  Extremities: no cyanosis, clubbing, rash, trace bilateral edema  Neuro: alert & oriented x 3, cranial nerves grossly intact. moves all 4 extremities w/o difficulty. Affect  pleasant.   Telemetry: SR  Labs: Basic Metabolic Panel:  Recent Labs Lab 08/31/13 0850  NA 140  K 3.7  CL 99  CO2 28  GLUCOSE 107*  BUN 32*  CREATININE 1.84*  CALCIUM 9.3    Liver Function Tests: No results found for this basename: AST, ALT, ALKPHOS, BILITOT, PROT, ALBUMIN,  in the last 168 hours No results found for this basename: LIPASE, AMYLASE,  in the last 168 hours No results found for this basename: AMMONIA,  in the last 168 hours  CBC:  Recent Labs Lab 08/31/13 0850  WBC 6.4  HGB 12.9*  HCT 41.2  MCV 91.8  PLT 126*    Cardiac Enzymes: No results found for this basename: CKTOTAL, CKMB, CKMBINDEX, TROPONINI,  in the last 168 hours  BNP: BNP (last 3 results)  Recent Labs  10/14/12 1232 02/10/13 1540 02/26/13 1627  PROBNP 1146.0* 7341.0* 1100.0*    CBG: No results found for this basename: GLUCAP,  in the last 168 hours  Coagulation Studies:  Recent Labs  08/31/13 0850  LABPROT 23.0*  INR 2.11*    Other results: EKG: pending   Imaging:  No results found.      Assessment:   1. Cardiogenic shock  2. A/C systolic HF due to ICM EF 15% 3. iCM 4. CAD 5. PAD   Plan/Discussion:     Will admit to CCU. Start milrinone. Will likely need home inotropes.    Length of Stay: 0   Genevive Bi  08/31/2013, 7:47 PM  Advanced Heart Failure Team Pager [phone] (M-F; 7a - 4p)  Please contact [facility] for night-coverage after hours (4p -7a ) and  weekends on [email or web address]

## 2013-08-31 NOTE — Progress Notes (Signed)
ANTICOAGULATION CONSULT NOTE - Initial Consult  Pharmacy Consult for coumadin Indication: afib  Allergies  Allergen Reactions  . Codeine     "tripped out on it"  . Morphine     "tripped out on it"    Patient Measurements: Height: 6' (182.9 cm) Weight: 190 lb (86.183 kg) IBW/kg (Calculated) : 77.6  Vital Signs: Temp: 97.8 F (36.6 C) (06/15 0836) Temp src: Oral (06/15 0836) BP: 121/60 mmHg (06/15 0836) Pulse Rate: 30 (06/15 1105)  Labs:  Recent Labs  08/31/13 0850  HGB 12.9*  HCT 41.2  PLT 126*  LABPROT 23.0*  INR 2.11*  CREATININE 1.84*    Estimated Creatinine Clearance: 36.3 ml/min (by C-G formula based on Cr of 1.84).   Medical History: Past Medical History  Diagnosis Date  . Coronary artery disease   . Other and unspecified hyperlipidemia   . Hypertension   . Atrial fibrillation   . Sinoatrial node dysfunction   . PVD (peripheral vascular disease)     s/p renal artery stent 20-04  . Pacemaker st Jude   . AAA (abdominal aortic aneurysm)     repaired  . Systolic congestive heart failure   . Cardiomyopathy   . Carotid artery occlusion   . Atrial fibrillation   . Myocardial infarction   . Stroke     left sided affected no residual effects  . GERD (gastroesophageal reflux disease)   . Arthritis   . Hypothyroidism     Medications:  Warfarin 4mg  daily  Assessment: 80 year old male admitted after diagnostic RHC showing low output heart failure. Patient has history of afib on chronic coumadin. INR therapeutic at 2.1. No complications noted post-cath. Admitted for milrinone initiation.  Goal of Therapy:  INR 2-3 Monitor platelets by anticoagulation protocol: Yes   Plan:  Coumadin 4mg  daily Daily INR   Erin Hearing PharmD., BCPS Clinical Pharmacist Pager [phone] 08/31/2013 12:47 PM

## 2013-08-31 NOTE — Care Management Note (Addendum)
    Page 1 of 2   09/01/2013     9:29:14 AM CARE MANAGEMENT NOTE 09/01/2013  Patient:  Trevor Simpson, Trevor Simpson   Account Number:  [id number]  Date Initiated:  08/31/2013  Documentation initiated by:  Elissa Hefty  Subjective/Objective Assessment:   adm w heart failure     Action/Plan:   lives w wife, pcp dr Jeneen Rinks kim   Anticipated DC Date:     Anticipated DC Plan:  [place]  CM consult      [facility] Choice  [place]   Choice offered to / List presented to:  C-3 Spouse   DME arranged  [employer]EQUIPMENT      DME agency  [place] arranged  HH-1 RN  [place].   Status of service:   Medicare Important Message given?   (If response is "NO", the following Medicare IM given date fields will be blank) Date Medicare IM given:   Date Additional Medicare IM given:    Discharge Disposition:  [place]  Per UR Regulation:  Reviewed for med. necessity/level of care/duration of stay  If discussed at Long Length of Stay Meetings, dates discussed:    Comments:  6/16 0926 debbie dowell rn,bsn spoke w pt's wife. they have used [facility] in past. may need home milrinone. they would like to use [facility]. ref to pam for poss home milrinone and at least hhrn. will cont to follow.

## 2013-08-31 NOTE — CV Procedure (Signed)
Cardiac Cath Procedure Note:  Indication:  Heart failure  Procedures performed:  1) Right heart catheterization  Description of procedure:   The risks and indication of the procedure were explained. Consent was signed and placed on the chart. An appropriate timeout was taken prior to the procedure. The right neck was prepped and draped in the routine sterile fashion and anesthetized with 1% local lidocaine.   A 7 FR venous sheath was placed in the right internal jugular vein using a modified Seldinger technique. A standard Swan-Ganz catheter was used for the procedure.   Complications: None apparent.  Findings:  RA = 11 RV = 56/5/12 PA = 53/24 (36) PCW = 22 ( v = 35) Fick cardiac output/index = 3.7/1.8 Thermo CO/CI = 3.0/1.5 PVR = 4.7 Woods FA sat = 90% PA sat = 49%, 46%  Assessment: 1. Low output HF with moderately elevated biventricular pressure and prominent v-waves in PCWP tracing suggested of significant MR  Plan/Discussion:  Admit to ICU for milrinone initiation.   Daniel BensimhonMD 11:27 AM

## 2013-09-01 ENCOUNTER — Inpatient Hospital Stay (HOSPITAL_COMMUNITY): Payer: Medicare HMO

## 2013-09-01 DIAGNOSIS — I5021 Acute systolic (congestive) heart failure: Secondary | ICD-10-CM

## 2013-09-01 DIAGNOSIS — I509 Heart failure, unspecified: Secondary | ICD-10-CM

## 2013-09-01 DIAGNOSIS — R570 Cardiogenic shock: Secondary | ICD-10-CM

## 2013-09-01 LAB — PROTIME-INR
INR: 2.52 — ABNORMAL HIGH (ref 0.00–1.49)
PROTHROMBIN TIME: 26.3 s — AB (ref 11.6–15.2)

## 2013-09-01 LAB — CARBOXYHEMOGLOBIN
CARBOXYHEMOGLOBIN: 1.8 % — AB (ref 0.5–1.5)
Methemoglobin: 0.7 % (ref 0.0–1.5)
O2 SAT: 64 %
Total hemoglobin: 11.8 g/dL — ABNORMAL LOW (ref 13.5–18.0)

## 2013-09-01 LAB — BASIC METABOLIC PANEL
BUN: 27 mg/dL — ABNORMAL HIGH (ref 6–23)
CALCIUM: 8.7 mg/dL (ref 8.4–10.5)
CO2: 26 meq/L (ref 19–32)
Chloride: 98 mEq/L (ref 96–112)
Creatinine, Ser: 1.37 mg/dL — ABNORMAL HIGH (ref 0.50–1.35)
GFR calc Af Amer: 55 mL/min — ABNORMAL LOW (ref >90)
GFR calc non Af Amer: 48 mL/min — ABNORMAL LOW (ref >90)
Glucose, Bld: 109 mg/dL — ABNORMAL HIGH (ref 70–99)
Potassium: 3.9 mEq/L (ref 3.7–5.3)
SODIUM: 137 meq/L (ref 137–147)

## 2013-09-01 MED ORDER — FUROSEMIDE 10 MG/ML IJ SOLN
80.0000 mg | Freq: Once | INTRAMUSCULAR | Status: AC
  Administered 2013-09-01: 80 mg via INTRAVENOUS
  Filled 2013-09-01: qty 8

## 2013-09-01 MED ORDER — SODIUM CHLORIDE 0.9 % IJ SOLN
10.0000 mL | INTRAMUSCULAR | Status: DC | PRN
Start: 2013-09-01 — End: 2013-09-02

## 2013-09-01 MED ORDER — POTASSIUM CHLORIDE CRYS ER 20 MEQ PO TBCR
40.0000 meq | EXTENDED_RELEASE_TABLET | Freq: Every day | ORAL | Status: DC
  Administered 2013-09-01 – 2013-09-02 (×2): 40 meq via ORAL
  Filled 2013-09-01 (×2): qty 2

## 2013-09-01 MED ORDER — FUROSEMIDE 10 MG/ML IJ SOLN
80.0000 mg | Freq: Two times a day (BID) | INTRAMUSCULAR | Status: DC
Start: 2013-09-01 — End: 2013-09-01

## 2013-09-01 MED ORDER — SODIUM CHLORIDE 0.9 % IJ SOLN
10.0000 mL | Freq: Two times a day (BID) | INTRAMUSCULAR | Status: DC
Start: 2013-09-01 — End: 2013-09-02
  Administered 2013-09-01 – 2013-09-02 (×2): 10 mL

## 2013-09-01 NOTE — Progress Notes (Signed)
[place] for coumadin Indication: afib  Allergies  Allergen Reactions  . Codeine     "tripped out on it"  . Morphine     "tripped out on it"    Patient Measurements: Height: 6' (182.9 cm) Weight: 190 lb (86.183 kg) IBW/kg (Calculated) : 77.6  Vital Signs: Temp: 97.5 F (36.4 C) (06/16 0700) Temp src: Core (Comment) (06/16 0400) BP: 143/67 mmHg (06/16 0700) Pulse Rate: 59 (06/16 0700)  Labs:  Recent Labs  08/31/13 0850 09/01/13 0345  HGB 12.9*  --   HCT 41.2  --   PLT 126*  --   LABPROT 23.0* 26.3*  INR 2.11* 2.52*  CREATININE 1.84* 1.37*    Estimated Creatinine Clearance: 48.8 ml/min (by C-G formula based on Cr of 1.37).   Medical History: Past Medical History  Diagnosis Date  . Coronary artery disease   . Other and unspecified hyperlipidemia   . Hypertension   . Atrial fibrillation   . Sinoatrial node dysfunction   . PVD (peripheral vascular disease)     s/p renal artery stent 20-04  . Pacemaker st Jude   . AAA (abdominal aortic aneurysm)     repaired  . Systolic congestive heart failure   . Cardiomyopathy   . Carotid artery occlusion   . Atrial fibrillation   . Myocardial infarction   . Stroke     left sided affected no residual effects  . GERD (gastroesophageal reflux disease)   . Arthritis   . Hypothyroidism     Medications:  Warfarin 4mg  daily  Assessment: 80 year old male admitted after diagnostic RHC showing low output heart failure. Patient has history of afib on chronic coumadin. INR therapeutic at 2.4. No complications noted. Admitted for milrinone initiation and appears to be tolerating well with coox of 64 this morning. Will continue home dose of warfarin, no immediate procedures planned.  Goal of Therapy:  INR 2-3 Monitor platelets by anticoagulation protocol: Yes   Plan:  Coumadin 4mg  daily Daily INR   Erin Hearing PharmD., BCPS Clinical Pharmacist Pager [phone] 09/01/2013 7:34  AM

## 2013-09-01 NOTE — Progress Notes (Signed)
Advanced Heart Failure Rounding Note   Subjective:     Trevor Simpson is a 80 year old with history of COPD, symptomatic bradycardia, s/p ST Jude PPM, PAF, HTN, PAD, CVA and chronic systolic heart failure EF 20% being admitted from cath lab after RHC showed low output physiology.  Has had progressive HF symptoms for 1-2 months despite close f/u in HF Clinic.   Admitted yesterday after RHC and started on Milrinone 0.25 mcg.   Denies SOB/PND/Orthopnea.   [place] 08/31/13 :  RA = 11  RV = 56/5/12  PA = 53/24 (36)  PCW = 22 ( v = 35)  Fick cardiac output/index = 3.7/1.8  Thermo CO/CI = 3.0/1.5  PVR = 4.7 Woods  FA sat = 90%  PA sat = 49%, 46%   Swan Numbers this am.  49/20 (32)  PCWP 20 v waves to 35 CVP 7 CO 3.8 CI 1.9  CO-OX 64%   Objective:   Weight Range:  Vital Signs:   Temp:  [96.1 F (35.6 C)-97.8 F (36.6 C)] 97.5 F (36.4 C) (06/16 0700) Pulse Rate:  [30-61] 59 (06/16 0700) Resp:  [11-18] 14 (06/16 0700) BP: (91-143)/(45-75) 143/67 mmHg (06/16 0700) SpO2:  [91 %-99 %] 97 % (06/16 0700) Weight:  [190 lb (86.183 kg)] 190 lb (86.183 kg) (06/15 0836)    Weight change: Filed Weights   08/31/13 0836  Weight: 190 lb (86.183 kg)    Intake/Output:   Intake/Output Summary (Last 24 hours) at 09/01/13 0731 Last data filed at 09/01/13 0700  Gross per 24 hour  Intake 821.01 ml  Output    750 ml  Net  71.01 ml     Physical Exam: CVP 14  General:  Well appearing. No resp difficulty HEENT: normal Neck: supple. JVP to jaw . Carotids 2+ bilat; no bruits. No lymphadenopathy or thryomegaly appreciated. R neck swan Cor: PMI nondisplaced. Regular rate & rhythm. No rubs, gallops or murmurs. Lungs: clear Abdomen: soft, nontender, nondistended. No hepatosplenomegaly. No bruits or masses. Good bowel sounds. Extremities: no cyanosis, clubbing, rash, R and LLE 2-3+ edema Neuro: alert & orientedx3, cranial nerves grossly intact. moves all 4 extremities w/o difficulty. Affect  pleasant  Telemetry: A fib 60s   Labs: Basic Metabolic Panel:  Recent Labs Lab 08/31/13 0850 09/01/13 0345  NA 140 137  K 3.7 3.9  CL 99 98  CO2 28 26  GLUCOSE 107* 109*  BUN 32* 27*  CREATININE 1.84* 1.37*  CALCIUM 9.3 8.7    Liver Function Tests: No results found for this basename: AST, ALT, ALKPHOS, BILITOT, PROT, ALBUMIN,  in the last 168 hours No results found for this basename: LIPASE, AMYLASE,  in the last 168 hours No results found for this basename: AMMONIA,  in the last 168 hours  CBC:  Recent Labs Lab 08/31/13 0850  WBC 6.4  HGB 12.9*  HCT 41.2  MCV 91.8  PLT 126*    Cardiac Enzymes: No results found for this basename: CKTOTAL, CKMB, CKMBINDEX, TROPONINI,  in the last 168 hours  BNP: BNP (last 3 results)  Recent Labs  10/14/12 1232 02/10/13 1540 02/26/13 1627  PROBNP 1146.0* 7341.0* 1100.0*     Other results:  EKG:   Imaging:  No results found.   Medications:     Scheduled Medications: . amiodarone  200 mg Oral Daily  . atorvastatin  20 mg Oral q1800  . levothyroxine  112 mcg Oral QAC breakfast  . sodium chloride  3 mL Intravenous Q12H  .  tamsulosin  0.4 mg Oral QPC supper  . warfarin  4 mg Oral q1800  . Warfarin - Pharmacist Dosing Inpatient   Does not apply q1800     Infusions: . milrinone 0.25 mcg/kg/min (08/31/13 2353)     PRN Medications:  sodium chloride, sodium chloride   Assessment:  1. Cardiogenic shock  2. A/C systolic HF due to ICM EF 15%  3. ICM 4. CAD  5. PAD  6. Chronic A fib  7. LBBB 8. H/o symptomatic bradycardia s/p PPM     Plan/Discussion:    Hemodynamics improved on Milrinone 0.25 mcg. CO-OX 64%. Continue Milrinone 0.25 mcg. Volume status mildly elevated. Give 80 mg IV lasix x 1. No beta blocker due to cardiogenic shock. Hold off on afterload and consider adding arb if BP ok. He is aware he may need home milrinone and agreeable. Add ted hose.   Rate controlled. Continue amiodarone 200  mg dialy. On coumadin. INR per pharmacy.    Consult CM for home milrinone. [facility] to follow for home milrinone.   Length of Stay: 1   CLEGG,AMY NP-C  09/01/2013, 7:31 AM  Advanced Heart Failure Team Pager [phone] (M-F; [place])  Please contact [facility] for night-coverage after hours (4p -7a ) and weekends on [email or web address]  Patient seen and examined with Darrick Grinder, NP. We discussed all aspects of the encounter. I agree with the assessment and plan as stated above.   Trevor Simpson Blare numbers obtained personally with nursing staff. He is much improved with milrinone. I think best plan will be to send him home with milrinone for a period of time and let him build back his strength. We can then revisit the possibility of changing dual chamber PM to CRT-P or D. We discussed risks and benefits of home inotrope therapy including arrhythmogenic death. Will place PICC today. Possible home tomorrow. Will have STJ rep interrogate device and help [place] assess degree of RV pacing.   The patient is critically ill with multiple organ systems failure and requires high complexity decision making for assessment and support, frequent evaluation and titration of therapies, application of advanced monitoring technologies and extensive interpretation of multiple databases.   Critical Care Time personally devoted to patient care services described in this note is 35 Minutes.   Daniel Bensimhon,MD 8:23 AM

## 2013-09-01 NOTE — Discharge Instructions (Signed)
Heart Failure Heart failure means your heart has trouble pumping blood. This makes it hard for your body to work well. Heart failure is usually a long-term (chronic) condition. You must take good care of yourself and follow your doctor's treatment plan. HOME CARE  Take your heart medicine as told by your doctor.  Do not stop taking medicine unless your doctor tells you to.  Do not skip any dose of medicine.  Refill your medicines before they run out.  Take other medicines only as told by your doctor or pharmacist.  Stay active if told by your doctor. The elderly and people with severe heart failure should talk with a doctor about physical activity.  Eat heart healthy foods. Choose foods that are without trans fat and are low in saturated fat, cholesterol, and salt (sodium). This includes fresh or frozen fruits and vegetables, fish, lean meats, fat-free or low-fat dairy foods, whole grains, and high-fiber foods. Lentils and dried peas and beans (legumes) are also good choices.  Limit salt if told by your doctor.  Cook in a healthy way. Roast, grill, broil, bake, poach, steam, or stir-fry foods.  Limit fluids as told by your doctor.  Weigh yourself every morning. Do this after you pee (urinate) and before you eat breakfast. Write down your weight to give to your doctor.  Take your blood pressure and write it down if your doctor tell you to.  Ask your doctor how to check your pulse. Check your pulse as told.  Lose weight if told by your doctor.  Stop smoking or chewing tobacco. Do not use gum or patches that help you quit without your doctor's approval.  Schedule and go to doctor visits as told.  Nonpregnant women should have no more than 1 drink a day. Men should have no more than 2 drinks a day. Talk to your doctor about drinking alcohol.  Stop illegal drug use.  Stay current with shots (immunizations).  Manage your health conditions as told by your doctor.  Learn to manage  your stress.  Rest when you are tired.  If it is really hot outside:  Avoid intense activities.  Use air conditioning or fans, or get in a cooler place.  Avoid caffeine and alcohol.  Wear loose-fitting, lightweight, and light-colored clothing.  If it is really cold outside:  Avoid intense activities.  Layer your clothing.  Wear mittens or gloves, a hat, and a scarf when going outside.  Avoid alcohol.  Learn about heart failure and get support as needed.  Get help to maintain or improve your quality of life and your ability to care for yourself as needed. GET HELP IF:   You gain 03 lb/1.4 kg or more in 1 day or 05 lb/2.3 kg in a week.  You are more short of breath than usual.  You cannot do your normal activities.  You tire easily.  You cough more than normal, especially with activity.  You have any or more puffiness (swelling) in areas such as your hands, feet, ankles, or belly (abdomen).  You cannot sleep because it is hard to breathe.  You feel like your heart is beating fast (palpitations).  You get dizzy or lightheaded when you stand up. GET HELP RIGHT AWAY IF:   You have trouble breathing.  There is a change in mental status, such as becoming less alert or not being able to focus.  You have chest pain or discomfort.  You faint. MAKE SURE YOU:   Understand these   instructions.  Will watch your condition.  Will get help right away if you are not doing well or get worse. Document Released: 12/13/2007 Document Revised: 06/30/2012 Document Reviewed: 10/04/2011 Exit[employer] Patient Information 2014 [employer].  

## 2013-09-01 NOTE — Progress Notes (Signed)
Peripherally Inserted Central Catheter/Midline Placement  The IV Nurse has discussed with the patient and/or persons authorized to consent for the patient, the purpose of this procedure and the potential benefits and risks involved with this procedure.  The benefits include less needle sticks, lab draws from the catheter and patient may be discharged home with the catheter.  Risks include, but not limited to, infection, bleeding, blood clot (thrombus formation), and puncture of an artery; nerve damage and irregular heat beat.  Alternatives to this procedure were also discussed.  PICC/Midline Placement Documentation  PICC / Midline Double Lumen 56/86/16 PICC Right Basilic 43 cm 0 cm (Active)  Indication for Insertion or Continuance of Line Prolonged intravenous therapies;Home intravenous therapies (PICC only) 09/01/2013  2:23 PM  Exposed Catheter (cm) 0 cm 09/01/2013  2:23 PM  Site Assessment Clean;Dry;Intact 09/01/2013  2:23 PM  Dressing Change Due 09/08/13 09/01/2013  2:23 PM     Claretha Cooper, RN  Poff, Mordecai Rasmussen 09/01/2013, 2:23 PM

## 2013-09-02 ENCOUNTER — Encounter (HOSPITAL_COMMUNITY): Payer: Medicare HMO

## 2013-09-02 LAB — BASIC METABOLIC PANEL WITH GFR
BUN: 23 mg/dL (ref 6–23)
CO2: 26 meq/L (ref 19–32)
Calcium: 8.7 mg/dL (ref 8.4–10.5)
Chloride: 98 meq/L (ref 96–112)
Creatinine, Ser: 1.24 mg/dL (ref 0.50–1.35)
GFR calc Af Amer: 62 mL/min — ABNORMAL LOW
GFR calc non Af Amer: 54 mL/min — ABNORMAL LOW
Glucose, Bld: 92 mg/dL (ref 70–99)
Potassium: 4.1 meq/L (ref 3.7–5.3)
Sodium: 138 meq/L (ref 137–147)

## 2013-09-02 LAB — CARBOXYHEMOGLOBIN
Carboxyhemoglobin: 1.9 % — ABNORMAL HIGH (ref 0.5–1.5)
Methemoglobin: 0.7 % (ref 0.0–1.5)
O2 Saturation: 56.9 %
Total hemoglobin: 13.2 g/dL — ABNORMAL LOW (ref 13.5–18.0)

## 2013-09-02 LAB — PROTIME-INR
INR: 2.39 — AB (ref 0.00–1.49)
Prothrombin Time: 25.3 seconds — ABNORMAL HIGH (ref 11.6–15.2)

## 2013-09-02 MED ORDER — LOSARTAN POTASSIUM 25 MG PO TABS: 12.5000 mg | ORAL_TABLET | Freq: Every day | ORAL | Status: DC

## 2013-09-02 MED ORDER — POTASSIUM CHLORIDE CRYS ER 20 MEQ PO TBCR: 20.0000 meq | EXTENDED_RELEASE_TABLET | Freq: Every day | ORAL | Status: DC

## 2013-09-02 MED ORDER — MILRINONE IN DEXTROSE 20 MG/100ML IV SOLN: 0.2500 ug/kg/min | INTRAVENOUS | Status: DC

## 2013-09-02 MED ORDER — FUROSEMIDE 80 MG PO TABS: 80.0000 mg | ORAL_TABLET | Freq: Every day | ORAL | Status: DC

## 2013-09-02 NOTE — Progress Notes (Signed)
Trevor Simpson for coumadin Indication: afib  Allergies  Allergen Reactions  . Codeine     "tripped out on it"  . Morphine     "tripped out on it"    Patient Measurements: Height: 6' (182.9 cm) Weight: 190 lb (86.183 kg) IBW/kg (Calculated) : 77.6  Vital Signs: Temp: 98.2 F (36.8 C) (06/17 0500) Temp src: Core (Comment) (06/17 0600) BP: 101/50 mmHg (06/17 0500) Pulse Rate: 60 (06/17 0600)  Labs:  Recent Labs  08/31/13 0850 09/01/13 0345 09/02/13 0445  HGB 12.9*  --   --   HCT 41.2  --   --   PLT 126*  --   --   LABPROT 23.0* 26.3* 25.3*  INR 2.11* 2.52* 2.39*  CREATININE 1.84* 1.37* 1.24    Estimated Creatinine Clearance: 53.9 ml/min (by C-G formula based on Cr of 1.24).   Medical History: Past Medical History  Diagnosis Date  . Coronary artery disease   . Other and unspecified hyperlipidemia   . Hypertension   . Atrial fibrillation   . Sinoatrial node dysfunction   . PVD (peripheral vascular disease)     s/p renal artery stent 20-04  . Pacemaker st Jude   . AAA (abdominal aortic aneurysm)     repaired  . Systolic congestive heart failure   . Cardiomyopathy   . Carotid artery occlusion   . Atrial fibrillation   . Myocardial infarction   . Stroke     left sided affected no residual effects  . GERD (gastroesophageal reflux disease)   . Arthritis   . Hypothyroidism     Medications:  Warfarin 4mg  daily  Assessment: 80 year old male admitted after diagnostic RHC showing low output heart failure. Patient has history of afib on chronic coumadin. INR therapeutic at 2.39. No complications noted. Admitted for milrinone initiation and appears to be tolerating well, plan to continue milrinone at home. Will continue home dose of warfarin at discharge  Goal of Therapy:  INR 2-3 Monitor platelets by anticoagulation protocol: Yes   Plan:  Coumadin 4mg  daily Daily INR   Erin Hearing PharmD., BCPS Clinical  Pharmacist Pager [phone] 09/02/2013 7:08 AM

## 2013-09-02 NOTE — Progress Notes (Signed)
Advanced Heart Failure Rounding Note   Subjective:     Trevor Simpson is a 80 year old with history of COPD, symptomatic bradycardia, s/p ST Jude PPM, PAF, HTN, PAD, CVA and chronic systolic heart failure EF 20% being admitted from cath lab after RHC showed low output physiology.  Has had progressive HF symptoms for 1-2 months despite close f/u in HF Clinic.   Admitted  after [facility] and started on Milrinone 0.25 mcg. PICC placed for home Milrinone. Denies SOB/PND/Orthopnea.   CO-OX 56.9 %  Objective:   Weight Range:  Vital Signs:   Temp:  [97.3 F (36.3 C)-99 F (37.2 C)] 98.2 F (36.8 C) (06/17 0500) Pulse Rate:  [57-61] 60 (06/17 0600) Resp:  [12-20] 19 (06/17 0500) BP: (101-153)/(42-109) 101/50 mmHg (06/17 0500) SpO2:  [90 %-98 %] 98 % (06/17 0600) Last BM Date: 08/29/13  Weight change: Filed Weights   08/31/13 0836  Weight: 190 lb (86.183 kg)    Intake/Output:   Intake/Output Summary (Last 24 hours) at 09/02/13 0656 Last data filed at 09/02/13 0600  Gross per 24 hour  Intake   1436 ml  Output   3005 ml  Net  -1569 ml     Physical Exam: CVP 5 General:  Well appearing. No resp difficulty Sitting in the chair. HEENT: normal Neck: supple. JVP 5-6 . Carotids 2+ bilat; no bruits. No lymphadenopathy or thryomegaly appreciated.  Cor: PMI nondisplaced. Regular rate & rhythm. No rubs, gallops or murmurs. Lungs: clear Abdomen: soft, nontender, nondistended. No hepatosplenomegaly. No bruits or masses. Good bowel sounds. Extremities: no cyanosis, clubbing, rash, R and LLE trace. + PICC line Neuro: alert & orientedx3, cranial nerves grossly intact. moves all 4 extremities w/o difficulty. Affect pleasant  Telemetry: A fib 60s   Labs: Basic Metabolic Panel:  Recent Labs Lab 08/31/13 0850 09/01/13 0345 09/02/13 0445  NA 140 137 138  K 3.7 3.9 4.1  CL 99 98 98  CO2 28 26 26   GLUCOSE 107* 109* 92  BUN 32* 27* 23  CREATININE 1.84* 1.37* 1.24  CALCIUM 9.3 8.7 8.7     Liver Function Tests: No results found for this basename: AST, ALT, ALKPHOS, BILITOT, PROT, ALBUMIN,  in the last 168 hours No results found for this basename: LIPASE, AMYLASE,  in the last 168 hours No results found for this basename: AMMONIA,  in the last 168 hours  CBC:  Recent Labs Lab 08/31/13 0850  WBC 6.4  HGB 12.9*  HCT 41.2  MCV 91.8  PLT 126*    Cardiac Enzymes: No results found for this basename: CKTOTAL, CKMB, CKMBINDEX, TROPONINI,  in the last 168 hours  BNP: BNP (last 3 results)  Recent Labs  10/14/12 1232 02/10/13 1540 02/26/13 1627  PROBNP 1146.0* 7341.0* 1100.0*     Other results:  EKG:   Imaging: Dg Chest Port 1 View  09/01/2013   CLINICAL DATA:  Central catheter placed  EXAM: PORTABLE CHEST - 1 VIEW  COMPARISON:  Chest radiograph March 23, 2013 and chest CT April 28, 2013  FINDINGS: Central catheter tip is in the superior vena cava near the cavoatrial junction. No pneumothorax.  There is left lower lobe consolidation. There is mild scarring in the right base. There is a questionable small left effusion. Heart is enlarged with pulmonary vascularity within normal limits. Pacemaker leads are attached to the right atrium and right ventricle. No adenopathy apparent.  IMPRESSION: Central catheter tip in superior vena cava near junction with right atrium. No pneumothorax.  Left lower lobe consolidation with small left effusion. Mild scarring right base. Heart enlarged but stable.   Electronically Signed   By: Lowella Grip M.D.   On: 09/01/2013 15:03     Medications:     Scheduled Medications: . amiodarone  200 mg Oral Daily  . atorvastatin  20 mg Oral q1800  . levothyroxine  112 mcg Oral QAC breakfast  . potassium chloride  40 mEq Oral Daily  . sodium chloride  10-40 mL Intracatheter Q12H  . sodium chloride  3 mL Intravenous Q12H  . tamsulosin  0.4 mg Oral QPC supper  . warfarin  4 mg Oral q1800  . Warfarin - Pharmacist Dosing Inpatient    Does not apply q1800    Infusions: . milrinone 0.25 mcg/kg/min (09/02/13 0600)    PRN Medications: sodium chloride, sodium chloride, sodium chloride   Assessment:  1. Cardiogenic shock  2. A/C systolic HF due to ICM EF 15%  3. ICM 4. CAD  5. PAD  6. Chronic A fib  7. LBBB 8. H/o symptomatic bradycardia s/p PPM     Plan/Discussion:   Much improved on Milrinone. Renal function improved. Continue Milrinone 0.25 mcg. Volume status stable. Restart lasix 80 mg daily. No beta blocker. Restart 12.5 mg losartan.  Rate controlled. Continue amiodarone 200 mg dialy. On coumadin. INR per pharmacy.    [facility] to follow for home milrinone. PICC in place. Weekly BMET and INR in one week by [facility].   Consult cardiac rehab.    Home later today.   Length of Stay: 2   CLEGG,AMY NP-C  09/02/2013, 6:56 AM  Advanced Heart Failure Team Pager [phone] (M-F; [place])  Please contact [facility] for night-coverage after hours (4p -7a ) and weekends on [email or web address]  Patient seen and examined with Darrick Grinder, NP. We discussed all aspects of the encounter. I agree with the assessment and plan as stated above.   Improved on milrinone. Will d/c today with home inotropes. [facility] arranging outpatient care. Will follow closely in HF CLinic.  Daniel Bensimhon,MD 10:17 AM

## 2013-09-02 NOTE — Discharge Summary (Signed)
Advanced Heart Failure Team  Discharge Summary   Patient ID: Trevor Simpson MRN: [id number], DOB/AGE: Oct 03, 1933 80 y.o. Admit date: 08/31/2013 D/C date:     09/02/2013   Primary Discharge Diagnoses:  1. Cardiogenic shock --> Discharging on Milrinone 0.25 mcg  2. A/C systolic HF due to ICM EF 15%  3. ICM  4. CAD  5. PAD  6. Chronic A fib  7. LBBB  8. H/o symptomatic bradycardia s/p Trevor Simpson    Hospital Course:  Trevor Simpson is a 80 year old with history of COPD, symptomatic bradycardia, s/p ST Jude PPM, PAF, HTN, PAD, CVA and chronic systolic heart failure EF 20% being admitted from cath lab after RHC showed low output physiology. He has  had progressive HF symptoms for 1-2 months despite close f/u in HF Clinic.   He was admitted after RHC due to cardiogenic shock. He was placed on Milrinone at 0.25 mcg with improved hemodynamics noted. PICC line was placed in RUE for home Milrinone. He will remain off bb and continue low dose ARB.   [facility] to follow for home Milrinone and weekly BMET /INR. He will continue to be followed closely in the HF clinic with follow up September 08, 2013.   RHC 08/31/13  RA = 11  RV = 56/5/12  PA = 53/24 (36)  PCW = 22 ( v = 35)  Fick cardiac output/index = 3.7/1.8  Thermo CO/CI = 3.0/1.5  PVR = 4.7 [place]  FA sat = 90%  PA sat = 49%, 46   Discharge Weight Range: 195 pounds    Discharge Vitals: Blood pressure 117/51, pulse 60, temperature 97.5 F (36.4 C), temperature source Oral, resp. rate 26, height 6' (1.829 m), weight 195 lb 1.7 oz (88.5 kg), SpO2 95.00%.  Labs: Lab Results  Component Value Date   WBC 6.4 08/31/2013   HGB 12.9* 08/31/2013   HCT 41.2 08/31/2013   MCV 91.8 08/31/2013   PLT 126* 08/31/2013    Recent Labs Lab 09/02/13 0445  NA 138  K 4.1  CL 98  CO2 26  BUN 23  CREATININE 1.24  CALCIUM 8.7  GLUCOSE 92   No results found for this basename: CHOL, HDL, LDLCALC, TRIG   BNP (last 3 results)  Recent Labs  10/14/12 1232  02/10/13 1540 02/26/13 1627  PROBNP 1146.0* 7341.0* 1100.0*    Diagnostic Studies/Procedures   Dg Chest Port 1 View  09/01/2013   CLINICAL DATA:  Central catheter placed  EXAM: PORTABLE CHEST - 1 VIEW  COMPARISON:  Chest radiograph March 23, 2013 and chest CT April 28, 2013  FINDINGS: Central catheter tip is in the superior vena cava near the cavoatrial junction. No pneumothorax.  There is left lower lobe consolidation. There is mild scarring in the right base. There is a questionable small left effusion. Heart is enlarged with pulmonary vascularity within normal limits. Pacemaker leads are attached to the right atrium and right ventricle. No adenopathy apparent.  IMPRESSION: Central catheter tip in superior vena cava near junction with right atrium. No pneumothorax. Left lower lobe consolidation with small left effusion. Mild scarring right base. Heart enlarged but stable.   Electronically Signed   By: Trevor Grip M.D.   On: 09/01/2013 15:03    Discharge Medications     Medication List    STOP taking these medications       bisoprolol 5 MG tablet  Commonly known as:  ZEBETA      TAKE these medications  ALPRAZolam 0.25 MG tablet  Commonly known as:  XANAX  Take 0.25 mg by mouth 3 (three) times daily as needed for sleep or anxiety.     amiodarone 200 MG tablet  Commonly known as:  PACERONE  Take 1 tablet (200 mg total) by mouth daily.     atorvastatin 20 MG tablet  Commonly known as:  LIPITOR  Take 20 mg by mouth daily at 6 PM.     calcium carbonate 600 MG Tabs tablet  Commonly known as:  OS-CAL  Take 600 mg by mouth daily with breakfast.     cetirizine 10 MG tablet  Commonly known as:  ZYRTEC  Take 10 mg by mouth daily.     fish oil-omega-3 fatty acids 1000 MG capsule  Take 1 g by mouth 2 (two) times daily.     furosemide 80 MG tablet  Commonly known as:  LASIX  Take 1 tablet (80 mg total) by mouth daily. Take 80mg  in the morning and take 40mg  in the  evening     levothyroxine 112 MCG tablet  Commonly known as:  SYNTHROID, LEVOTHROID  Take 112 mcg by mouth daily.     losartan 25 MG tablet  Commonly known as:  COZAAR  Take 0.5 tablets (12.5 mg total) by mouth daily.     magnesium gluconate 500 MG tablet  Commonly known as:  MAGONATE  Take 500 mg by mouth daily.     milrinone 20 MG/100ML Soln infusion  Commonly known as:  PRIMACOR  Inject 21.55 mcg/min into the vein continuous.     multivitamin with minerals Tabs tablet  Take 1 tablet by mouth daily.     nitroGLYCERIN 0.4 MG SL tablet  Commonly known as:  NITROSTAT  Place 1 tablet (0.4 mg total) under the tongue every 5 (five) minutes as needed for chest pain.     omeprazole 20 MG capsule  Commonly known as:  PRILOSEC  Take 20 mg by mouth daily as needed (indigestion).     potassium chloride SA 20 MEQ tablet  Commonly known as:  K-DUR,KLOR-CON  Take 1 tablet (20 mEq total) by mouth daily.     SAW PALMETTO PO  Take 1 capsule by mouth 2 (two) times daily.     SUPER B COMPLEX Tabs  Take 1 tablet by mouth daily.     tamsulosin 0.4 MG Caps capsule  Commonly known as:  FLOMAX  Take 0.4 mg by mouth daily after supper.     vitamin C 500 MG tablet  Commonly known as:  ASCORBIC ACID  Take 500 mg by mouth daily.     warfarin 4 MG tablet  Commonly known as:  COUMADIN  Take 4 mg by mouth daily.        Disposition   The patient will be discharged in stable condition to home.     Discharge Instructions   ACE Inhibitor / ARB already ordered    Complete by:  As directed      Diet - low sodium heart healthy    Complete by:  As directed      Heart Failure patients record your daily weight using the same scale at the same time of day    Complete by:  As directed      Increase activity slowly    Complete by:  As directed           Follow-up Information   Follow up with Trevor Bickers, MD On 09/08/2013. (at  1:20  Garage Code [id number])    Specialty:  Cardiology    Contact information:   [address] [place] [address] [phone]         Duration of Discharge Encounter: Greater than 35 minutes   Signed, CLEGG,AMY NP-C  09/02/2013, 11:41 AM  Patient seen and examined with Darrick Grinder, NP. We discussed all aspects of the encounter. I agree with the assessment and plan as stated above. He has become inotrope dependent. Will d/c home on milrnone. Currently I do not feel he is VAD or ICD candidate but we will see how he does after a few weeks of inotrope support.   Benay Spice 9:31 PM

## 2013-09-02 NOTE — Progress Notes (Signed)
CARDIAC REHAB PHASE I   PRE:  Rate/Rhythm: 61 pacing    BP: sitting 134/54    SaO2: 96 RA  MODE:  Ambulation: 390 ft, then 190 ft   POST:  Rate/Rhythm: 76    BP: sitting 120/53     SaO2: 95 RA while walking, 90 RA after walk  Pt slightly unsteady, has cane at home. Some SOB with walking but pt determined to go a good distance. SaO2 hard to register after walk due to ectopy so had pt walk a short distance (190 ft) again. See above, stable SaO2. Pt does get DOE and HHRN sts she can eval him again at home. Reviewed HF and low sodium. Gave ex gl for walking as tolerated. Pt wants to return to [facility]. Encouraged pt to build up his walking first then maybe he could go back in 3-4 weeks.  [id number]   Josephina Shih Jasper CES, ACSM 09/02/2013 11:40 AM

## 2013-09-02 NOTE — Progress Notes (Signed)
DC orders received.  Patient stable with no S/S of distress.  Medication and discharge information reviewed with patient, patient's wife and patient's daughter.  Patient DC home with family. [place], Ardeth Sportsman

## 2013-09-08 ENCOUNTER — Encounter: Payer: Self-pay | Admitting: Internal Medicine

## 2013-09-08 ENCOUNTER — Ambulatory Visit (HOSPITAL_COMMUNITY)
Admission: RE | Admit: 2013-09-08 | Discharge: 2013-09-08 | Disposition: A | Discharge status: Home / Self Care | Payer: Medicare HMO | Source: Ambulatory Visit | Attending: Cardiology | Admitting: Cardiology

## 2013-09-08 ENCOUNTER — Encounter: Payer: Self-pay | Admitting: *Deleted

## 2013-09-08 ENCOUNTER — Ambulatory Visit (INDEPENDENT_AMBULATORY_CARE_PROVIDER_SITE_OTHER): Payer: Medicare HMO | Admitting: Internal Medicine

## 2013-09-08 VITALS — BP 104/53 | HR 60 | Ht 72.0 in | Wt 182.6 lb

## 2013-09-08 VITALS — BP 108/58 | HR 61 | Wt 183.0 lb

## 2013-09-08 DIAGNOSIS — I495 Sick sinus syndrome: Secondary | ICD-10-CM

## 2013-09-08 DIAGNOSIS — J449 Chronic obstructive pulmonary disease, unspecified: Secondary | ICD-10-CM | POA: Diagnosis not present

## 2013-09-08 DIAGNOSIS — I739 Peripheral vascular disease, unspecified: Secondary | ICD-10-CM | POA: Diagnosis not present

## 2013-09-08 DIAGNOSIS — Z79899 Other long term (current) drug therapy: Secondary | ICD-10-CM | POA: Diagnosis not present

## 2013-09-08 DIAGNOSIS — Z8673 Personal history of transient ischemic attack (TIA), and cerebral infarction without residual deficits: Secondary | ICD-10-CM | POA: Insufficient documentation

## 2013-09-08 DIAGNOSIS — Z950 Presence of cardiac pacemaker: Secondary | ICD-10-CM

## 2013-09-08 DIAGNOSIS — I059 Rheumatic mitral valve disease, unspecified: Secondary | ICD-10-CM | POA: Insufficient documentation

## 2013-09-08 DIAGNOSIS — I480 Paroxysmal atrial fibrillation: Secondary | ICD-10-CM

## 2013-09-08 DIAGNOSIS — Z9861 Coronary angioplasty status: Secondary | ICD-10-CM | POA: Diagnosis not present

## 2013-09-08 DIAGNOSIS — I5022 Chronic systolic (congestive) heart failure: Secondary | ICD-10-CM | POA: Insufficient documentation

## 2013-09-08 DIAGNOSIS — I509 Heart failure, unspecified: Secondary | ICD-10-CM

## 2013-09-08 DIAGNOSIS — I10 Essential (primary) hypertension: Secondary | ICD-10-CM

## 2013-09-08 DIAGNOSIS — I2589 Other forms of chronic ischemic heart disease: Secondary | ICD-10-CM | POA: Insufficient documentation

## 2013-09-08 DIAGNOSIS — J4489 Other specified chronic obstructive pulmonary disease: Secondary | ICD-10-CM | POA: Insufficient documentation

## 2013-09-08 DIAGNOSIS — I519 Heart disease, unspecified: Secondary | ICD-10-CM | POA: Diagnosis not present

## 2013-09-08 DIAGNOSIS — I2510 Atherosclerotic heart disease of native coronary artery without angina pectoris: Secondary | ICD-10-CM

## 2013-09-08 DIAGNOSIS — Z8249 Family history of ischemic heart disease and other diseases of the circulatory system: Secondary | ICD-10-CM | POA: Insufficient documentation

## 2013-09-08 DIAGNOSIS — I255 Ischemic cardiomyopathy: Secondary | ICD-10-CM

## 2013-09-08 DIAGNOSIS — I4891 Unspecified atrial fibrillation: Secondary | ICD-10-CM | POA: Insufficient documentation

## 2013-09-08 DIAGNOSIS — I5023 Acute on chronic systolic (congestive) heart failure: Secondary | ICD-10-CM | POA: Insufficient documentation

## 2013-09-08 LAB — MDC_IDC_ENUM_SESS_TYPE_INCLINIC
Battery Remaining Longevity: 37.2 mo
Battery Voltage: 2.9 V
Brady Statistic RV Percent Paced: 45 %
Date Time Interrogation Session: 20150623171443
Implantable Pulse Generator Serial Number: 7105776
Lead Channel Impedance Value: 350 Ohm
Lead Channel Impedance Value: 475 Ohm
Lead Channel Pacing Threshold Amplitude: 1.125 V
Lead Channel Pacing Threshold Amplitude: 1.5 V
Lead Channel Pacing Threshold Amplitude: 1.5 V
Lead Channel Pacing Threshold Pulse Width: 0.4 ms
Lead Channel Pacing Threshold Pulse Width: 0.8 ms
Lead Channel Sensing Intrinsic Amplitude: 3 mV
Lead Channel Setting Pacing Pulse Width: 0.4 ms
Lead Channel Setting Sensing Sensitivity: 2 mV
MDC IDC MSMT LEADCHNL RA PACING THRESHOLD PULSEWIDTH: 0.8 ms
MDC IDC MSMT LEADCHNL RV SENSING INTR AMPL: 12 mV
MDC IDC SET LEADCHNL RA PACING AMPLITUDE: 3 V
MDC IDC SET LEADCHNL RV PACING AMPLITUDE: 1.375
MDC IDC STAT BRADY RA PERCENT PACED: 97 %

## 2013-09-08 LAB — CBC WITH DIFFERENTIAL/PLATELET
Basophils Absolute: 0.1 10*3/uL (ref 0.0–0.1)
Basophils Relative: 0.8 % (ref 0.0–3.0)
EOS ABS: 0.2 10*3/uL (ref 0.0–0.7)
Eosinophils Relative: 2.4 % (ref 0.0–5.0)
HEMATOCRIT: 40.3 % (ref 39.0–52.0)
HEMOGLOBIN: 12.9 g/dL — AB (ref 13.0–17.0)
Lymphocytes Relative: 16.7 % (ref 12.0–46.0)
Lymphs Abs: 1.1 10*3/uL (ref 0.7–4.0)
MCHC: 31.9 g/dL (ref 30.0–36.0)
MCV: 90.5 fl (ref 78.0–100.0)
MONO ABS: 0.7 10*3/uL (ref 0.1–1.0)
Monocytes Relative: 10 % (ref 3.0–12.0)
NEUTROS ABS: 4.6 10*3/uL (ref 1.4–7.7)
Neutrophils Relative %: 70.1 % (ref 43.0–77.0)
Platelets: 180 10*3/uL (ref 150.0–400.0)
RBC: 4.46 Mil/uL (ref 4.22–5.81)
RDW: 16.5 % — AB (ref 11.5–15.5)
WBC: 6.6 10*3/uL (ref 4.0–10.5)

## 2013-09-08 LAB — BASIC METABOLIC PANEL
BUN: 22 mg/dL (ref 6–23)
CO2: 28 meq/L (ref 19–32)
Calcium: 8.8 mg/dL (ref 8.4–10.5)
Chloride: 97 mEq/L (ref 96–112)
Creatinine, Ser: 1.5 mg/dL (ref 0.4–1.5)
GFR: 48.08 mL/min — ABNORMAL LOW (ref >60.00)
GLUCOSE: 133 mg/dL — AB (ref 70–99)
POTASSIUM: 3.6 meq/L (ref 3.5–5.1)
Sodium: 134 mEq/L — ABNORMAL LOW (ref 135–145)

## 2013-09-08 LAB — PROTIME-INR
INR: 2.2 ratio — ABNORMAL HIGH (ref 0.8–1.0)
Prothrombin Time: 24.3 s — ABNORMAL HIGH (ref 9.6–13.1)

## 2013-09-08 NOTE — Patient Instructions (Signed)
Your physician recommends that you schedule a follow-up appointment in: 1 month  

## 2013-09-08 NOTE — Assessment & Plan Note (Signed)
His chronic systolic heart failure is class IV off of milrinone and class 3 on milrinone. He has marked dysychrony on his ECG with a LBBB QRS (paced) duration of 200 ms. I have discussed the risks/benefits/goals/expectations of BiV ICD implant with the patient and his family and they wish to proceed.

## 2013-09-08 NOTE — Patient Instructions (Signed)
See instruction sheet for upgrade

## 2013-09-08 NOTE — Progress Notes (Signed)
Patient ID: CAMEO SHEWELL, male   DOB: May 28, 1933, 80 y.o.   MRN: [id number]  PCP: Dr Maudie Mercury Pulmonology: Dr Chase Caller Cardiology: Dr Lovena Le  HPI: Mr Brosnahan is a 80 year old with history of  COPD, symptomatic bradycardia, s/p ST Jude PPM, PAF, HTN, PAD, CVA and chronic systolic heart failure EF 20% referred to HF clinic by Dr Chase Caller in 1/15 for further evaluation of CHF and RUL infiltrate.   S/P RHC/LHC 08/2012  RA 12/14 with a mean of 11 mmHg  RV 38/11 mmHg  PA 41/23 with a mean of 29 mmHg  PCWP 20 over 20 with a mean of 15 mmHg  LV 101/13 mmHg  AO 108/52 with a mean of 71 mmHg  Cardiac Output (Fick) 4.3 L per minute  Cardiac Index (Fick) 2.0 L per minute per meter square  Left mainstem: The left main is long with 20% stenosis distally.  Left anterior descending (LAD): The left anterior descending artery is moderately calcified in the proximal vessel. There is diffuse disease in the proximal vessel up to 30%. The first diagonal is relatively small and has mild disease proximally. The ramus intermediate branch is a large branch that bifurcates in the mid vessel. The proximal vessel has a 90-95% stenosis.  Left circumflex (LCx): The left circumflex is relatively small. It gives rise to a single marginal branch and then continues in the AV groove. The first marginal branch has diffuse 60-70% stenosis proximally.  Right coronary artery (RCA): The right coronary is occluded proximally. There are right to right and left to right collaterals. Severe two-vessel obstructive coronary disease. Chronic total occlusion of the right coronary Successful stenting of the ramus intermediate branch with a bare-metal stent  He was treated for RUL pneumonia 12/14. He developed hemoptysis and his PCP stopped Xarelto and placed him on levaquin.  At that time he was referred to pulmonary. [name] Stopped levaquin and started cipro and clindamycin and coumadin. F/u CXR has shown persistent RUL opacity and there  is a question if this might be asymmetric edema.  However, CT showed scarring pattern likely from prior PNA.  After last appointment, patient had RHC (see below) in 6/15: Mean RA 11 PA 53/24  Mean PCWP 22 CI 1.5  Echo (6/15): EF 15%, moderate central MR, mild to moderately decreased RV systolic function.   He was started on milrinone gtt and diuresed.  He says that he feels much better on milrinone.  He walked around [employer] yesterday without dyspnea.  No chest pain.  No orthopnea or PND.  No edema. He saw Dr. Lovena Le today with plan for CRT-D upgrade in 7/15.  Weight is down 7 lbs.   Labs:  09/28/12 AST 27 ALT 21  02/10/13 K 3.8 Creatinine  1.41  02/26/13 Pro BNP 1100 07/06/12: K 3.9, creatinine 1.4, BUN 22 6/15: K 4.1, creatinine 1.2  SH: Lives at home with wife  FH: Father, Brother - CAD HTN, DM        Sister - lung cancer    ROS: All systems reviewed and negative except as per HPI.   Current Outpatient Prescriptions  Medication Sig Dispense Refill  . ALPRAZolam (XANAX) 0.25 MG tablet Take 0.25 mg by mouth 3 (three) times daily as needed for sleep or anxiety.       Marland Kitchen amiodarone (PACERONE) 200 MG tablet Take 1 tablet (200 mg total) by mouth daily.      Marland Kitchen atorvastatin (LIPITOR) 20 MG tablet Take 20 mg by mouth  daily at 6 PM.       . B Complex-C (SUPER B COMPLEX) TABS Take 1 tablet by mouth daily.       . calcium carbonate (OS-CAL) 600 MG TABS Take 600 mg by mouth daily with breakfast.       . cetirizine (ZYRTEC) 10 MG tablet Take 10 mg by mouth daily.       . fish oil-omega-3 fatty acids 1000 MG capsule Take 1 g by mouth 2 (two) times daily.       . furosemide (LASIX) 80 MG tablet Take 80mg  in the morning and take 40mg  in the evening      . levothyroxine (SYNTHROID, LEVOTHROID) 112 MCG tablet Take 112 mcg by mouth daily.      Marland Kitchen losartan (COZAAR) 25 MG tablet Take 25 mg by mouth daily.      . magnesium gluconate (MAGONATE) 500 MG tablet Take 500 mg by mouth daily.       .  milrinone (PRIMACOR) 20 MG/100ML SOLN infusion Inject 21.55 mcg/min into the vein continuous.  100 mL  6  . Multiple Vitamin (MULTIVITAMIN WITH MINERALS) TABS Take 1 tablet by mouth daily.      . nitroGLYCERIN (NITROSTAT) 0.4 MG SL tablet Place 1 tablet (0.4 mg total) under the tongue every 5 (five) minutes as needed for chest pain.  25 tablet  3  . omeprazole (PRILOSEC) 20 MG capsule Take 20 mg by mouth daily as needed (indigestion).       . potassium chloride SA (K-DUR,KLOR-CON) 20 MEQ tablet Take 1 tablet (20 mEq total) by mouth daily.  30 tablet  6  . Saw Palmetto, Serenoa repens, (SAW PALMETTO PO) Take 1 capsule by mouth 2 (two) times daily.      . tamsulosin (FLOMAX) 0.4 MG CAPS capsule Take 0.4 mg by mouth daily after supper.       . vitamin C (ASCORBIC ACID) 500 MG tablet Take 500 mg by mouth daily.        Marland Kitchen warfarin (COUMADIN) 4 MG tablet Take 4 mg by mouth daily.      . [DISCONTINUED] diltiazem (CARDIZEM CD) 120 MG 24 hr capsule        No current facility-administered medications for this encounter.     Allergies  Allergen Reactions  . Codeine     "tripped out on it"  . Morphine     "tripped out on it"    Filed Vitals:   09/08/13 1518  BP: 108/58  Pulse: 61  Weight: 183 lb (83.008 kg)  SpO2: 95%    PHYSICAL EXAM: General:  Elderly frail appearing. No respiratory difficulty. Wife and daughter in law present.  HEENT: normal Neck: supple. JVP 7; Carotids 2+ bilat; no bruits. No lymphadenopathy or thryomegaly appreciated. R neck scar Cor: Distant heart sounds. Regular rate & rhythm. +S3.  2/6 HSM apex.   Lungs: distant heart sounds. clear Abdomen: soft, nontender, nondistended. No hepatosplenomegaly. No bruits or masses. Good bowel sounds. Extremities: no cyanosis, clubbing, rash, 1+ bilateral edema Neuro: alert & oriented x 3, cranial nerves grossly intact. moves all 4 extremities w/o difficulty. Affect pleasant.  Results for orders placed in visit on 09/08/13 (from  the past 24 hour(s))  BASIC METABOLIC PANEL     Status: Abnormal   Collection Time    09/08/13  2:34 PM      Result Value Ref Range   Sodium 134 (*) 135 - 145 mEq/L   Potassium 3.6  3.5 - 5.1  mEq/L   Chloride 97  96 - 112 mEq/L   CO2 28  19 - 32 mEq/L   Glucose, Bld 133 (*) 70 - 99 mg/dL   BUN 22  6 - 23 mg/dL   Creatinine, Ser 1.5  0.4 - 1.5 mg/dL   Calcium 8.8  8.4 - 10.5 mg/dL   GFR 48.08 (*) >60.00 mL/min  CBC WITH DIFFERENTIAL     Status: Abnormal   Collection Time    09/08/13  2:34 PM      Result Value Ref Range   WBC 6.6  4.0 - 10.5 K/uL   RBC 4.46  4.22 - 5.81 Mil/uL   Hemoglobin 12.9 (*) 13.0 - 17.0 g/dL   HCT 40.3  39.0 - 52.0 %   MCV 90.5  78.0 - 100.0 fl   MCHC 31.9  30.0 - 36.0 g/dL   RDW 16.5 (*) 11.5 - 15.5 %   Platelets 180.0  150.0 - 400.0 K/uL   Neutrophils Relative % 70.1  43.0 - 77.0 %   Lymphocytes Relative 16.7  12.0 - 46.0 %   Monocytes Relative 10.0  3.0 - 12.0 %   Eosinophils Relative 2.4  0.0 - 5.0 %   Basophils Relative 0.8  0.0 - 3.0 %   Neutro Abs 4.6  1.4 - 7.7 K/uL   Lymphs Abs 1.1  0.7 - 4.0 K/uL   Monocytes Absolute 0.7  0.1 - 1.0 K/uL   Eosinophils Absolute 0.2  0.0 - 0.7 K/uL   Basophils Absolute 0.1  0.0 - 0.1 K/uL  PROTIME-INR     Status: Abnormal   Collection Time    09/08/13  2:34 PM      Result Value Ref Range   INR 2.2 (*) 0.8 - 1.0 ratio   Prothrombin Time 24.3 (*) 9.6 - 13.1 sec   No results found.   ASSESSMENT & PLAN:  1. Chronic Systolic Heart Failure: Ischemic cardiomyopathy.  Echo 6/15 with EF 15%, moderate MR, mild to moderately decreased RV systolic function.  RHC in 6/15 showed CI 1.5 so patient was started on milrinone.  He remains on this medication via PICC line. Doing better on milrinone, NYHA class II symptoms. - Continue Lasix 80 qam, 40 qpm.  BMET was drawn today already.  - Continue losartan.  - Plan for CRT-D upgrade on 09/24/13.  If he is a responder, we may be able to stop the milrinone eventually.  -  Patient is not a candidate for transplant with age and would be a difficult LVAD candidate with RV dysfunction. 2. PAF: S/P DC-CV 09/2012, on amiodarone 200 mg daily. Switched fromt xarelto to coumadin due to hemoptysis in setting of PNA. Coumadin managed at [facility].  Check LFTs and TSH at followup at 1 month.  Patient should get yearly eye exam with amiodarone use.  CBC drawn today.  3. CAD: S/P BMS ramus intermediate branch with a bare-metal stent. No evidence of ischemia. Continue statin and ARB. No ASA while on Coumadin.   Dalton French Ana 09/08/2013

## 2013-09-08 NOTE — Assessment & Plan Note (Signed)
He denies anginal symptoms. Will follow.

## 2013-09-08 NOTE — Assessment & Plan Note (Signed)
His St. Jude DDD PM is working normally. Will plan upgrade.

## 2013-09-08 NOTE — Assessment & Plan Note (Signed)
He appears to be maintaining NSR. He will continue amio.

## 2013-09-08 NOTE — Progress Notes (Signed)
HPI Trevor Simpson returns today for followup. He is a pleasant 80 yo man with multiple medical problems and worsening chronic systolic heart failure. He underwent right heart cath which demonstrated a low output state and he was place on IV milrinone. He has improved markedly. His heart failure symptoms are class 3A. He has pacing induced LBBB with a QRS duration of 200 ms. He is now referred to consider BiV ICD upgrade. He has not had syncope. Allergies  Allergen Reactions  . Codeine     "tripped out on it"  . Morphine     "tripped out on it"     Current Outpatient Prescriptions  Medication Sig Dispense Refill  . ALPRAZolam (XANAX) 0.25 MG tablet Take 0.25 mg by mouth 3 (three) times daily as needed for sleep or anxiety.       Marland Kitchen amiodarone (PACERONE) 200 MG tablet Take 1 tablet (200 mg total) by mouth daily.      Marland Kitchen atorvastatin (LIPITOR) 20 MG tablet Take 20 mg by mouth daily at 6 PM.       . B Complex-C (SUPER B COMPLEX) TABS Take 1 tablet by mouth daily.       . calcium carbonate (OS-CAL) 600 MG TABS Take 600 mg by mouth daily with breakfast.       . cetirizine (ZYRTEC) 10 MG tablet Take 10 mg by mouth daily.       . fish oil-omega-3 fatty acids 1000 MG capsule Take 1 g by mouth 2 (two) times daily.       . furosemide (LASIX) 80 MG tablet Take 80mg  in the morning and take 40mg  in the evening      . levothyroxine (SYNTHROID, LEVOTHROID) 112 MCG tablet Take 112 mcg by mouth daily.      Marland Kitchen losartan (COZAAR) 25 MG tablet Take 0.5 tablets (12.5 mg total) by mouth daily.  30 tablet  6  . magnesium gluconate (MAGONATE) 500 MG tablet Take 500 mg by mouth daily.       . milrinone (PRIMACOR) 20 MG/100ML SOLN infusion Inject 21.55 mcg/min into the vein continuous.  100 mL  6  . Multiple Vitamin (MULTIVITAMIN WITH MINERALS) TABS Take 1 tablet by mouth daily.      . nitroGLYCERIN (NITROSTAT) 0.4 MG SL tablet Place 1 tablet (0.4 mg total) under the tongue every 5 (five) minutes as needed for  chest pain.  25 tablet  3  . omeprazole (PRILOSEC) 20 MG capsule Take 20 mg by mouth daily as needed (indigestion).       . potassium chloride SA (K-DUR,KLOR-CON) 20 MEQ tablet Take 1 tablet (20 mEq total) by mouth daily.  30 tablet  6  . Saw Palmetto, Serenoa repens, (SAW PALMETTO PO) Take 1 capsule by mouth 2 (two) times daily.      . tamsulosin (FLOMAX) 0.4 MG CAPS capsule Take 0.4 mg by mouth daily after supper.       . vitamin C (ASCORBIC ACID) 500 MG tablet Take 500 mg by mouth daily.        Marland Kitchen warfarin (COUMADIN) 4 MG tablet Take 4 mg by mouth daily.      . [DISCONTINUED] diltiazem (CARDIZEM CD) 120 MG 24 hr capsule        No current facility-administered medications for this visit.     Past Medical History  Diagnosis Date  . Coronary artery disease   . Other and unspecified hyperlipidemia   . Hypertension   . Atrial  fibrillation   . Sinoatrial node dysfunction   . PVD (peripheral vascular disease)     s/p renal artery stent 20-04  . Pacemaker st Jude   . AAA (abdominal aortic aneurysm)     repaired  . Systolic congestive heart failure   . Cardiomyopathy   . Carotid artery occlusion   . Atrial fibrillation   . Myocardial infarction   . Stroke     left sided affected no residual effects  . GERD (gastroesophageal reflux disease)   . Arthritis   . Hypothyroidism     ROS:   All systems reviewed and negative except as noted in the HPI.   Past Surgical History  Procedure Laterality Date  . Inguinal hernia repair      left  . Cardiac pacemaker placement  08/01/2009    st jude dual chamber  . Colonoscopy    . Esophagogastroduodenoscopy    . Translumminal angioplasty    . Tee without cardioversion N/A 09/23/2012    Procedure: TRANSESOPHAGEAL ECHOCARDIOGRAM (TEE);  Surgeon: Lelon Perla, MD;  Location: [facility] ENDOSCOPY;  Service: Cardiovascular;  Laterality: N/A;  . Cardioversion N/A 09/23/2012    Procedure: CARDIOVERSION;  Surgeon: Lelon Perla, MD;  Location[facility]  ENDOSCOPY;  Service: Cardiovascular;  Laterality: N/A;  . Renal artery stent    . Abdominal aortic aneurysm repair    . Cardiac catheterization  08/2012    Stents  . Endarterectomy Right 11/25/2012    Procedure: ENDARTERECTOMY CAROTID With Dacron Patch Angioplasty;  Surgeon: Elam Dutch, MD;  Location: [facility] OR;  Service: Vascular;  Laterality: Right;  . Carotid endarterectomy Right 11-25-12     Family History  Problem Relation Age of Onset  . CAD Father   . Hypertension Father   . Heart disease Father   . CAD Brother   . Heart disease Brother   . Heart attack Brother   . Diabetes Brother   . Lung cancer Sister   . Heart disease Sister   . Hypertension Sister   . Hypertension Mother   . Hypertension Daughter   . Cancer Son      History   Social History  . Marital Status: Married    Spouse Name: N/A    Number of Children: N/A  . Years of Education: N/A   Occupational History  . Not on file.   Social History Main Topics  . Smoking status: Former Smoker -- 1.00 packs/day for 40 years    Types: Cigarettes    Quit date: 03/19/1993  . Smokeless tobacco: Current User    Types: Chew  . Alcohol Use: No  . Drug Use: No  . Sexual Activity: Not Currently   Other Topics Concern  . Not on file   Social History Narrative  . No narrative on file     BP 104/53  Pulse 60  Ht 6' (1.829 m)  Wt 182 lb 9.6 oz (82.827 kg)  BMI 24.76 kg/m2  Physical Exam:  Stable appearing 80 yo man, NAD HEENT: Unremarkable Neck:  No JVD, no thyromegally Back:  No CVA tenderness Lungs:  Clear except for minimal basilar rales. HEART:  Regular rate rhythm, no murmurs, no rubs, no clicks Abd:  soft, positive bowel sounds, no organomegally, no rebound, no guarding Ext:  2 plus pulses, no edema, no cyanosis, no clubbing Skin:  No rashes no nodules Neuro:  CN II through XII intact, motor grossly intact   DEVICE  Normal device function.  See PaceArt for  details.   Assess/Plan:

## 2013-09-10 ENCOUNTER — Encounter (HOSPITAL_COMMUNITY): Payer: Medicare HMO

## 2013-09-10 NOTE — Assessment & Plan Note (Signed)
He has been mostly hypotensive recently.

## 2013-09-10 NOTE — Assessment & Plan Note (Signed)
His chronic systolic heart failure symptoms are improved with IV milrinone. He has complete heart block and pacing induced LBBB and a QRS duration of 200 ms. I have recommended the patient undergo upgrade from a DDD PM to a BiV ICD.

## 2013-09-15 ENCOUNTER — Encounter: Payer: Managed Care, Other (non HMO) | Admitting: Internal Medicine

## 2013-09-15 ENCOUNTER — Encounter (HOSPITAL_COMMUNITY): Payer: Self-pay

## 2013-09-15 ENCOUNTER — Telehealth (HOSPITAL_COMMUNITY): Payer: Self-pay

## 2013-09-15 NOTE — Telephone Encounter (Signed)
Patient okay'ed to resume driving per Dr. Glori Bickers.  Will mail letter to home for patient reference. Renee Pain

## 2013-09-17 ENCOUNTER — Encounter: Payer: Self-pay | Admitting: Internal Medicine

## 2013-09-18 ENCOUNTER — Encounter (HOSPITAL_COMMUNITY): Payer: Self-pay | Admitting: Pharmacy Technician

## 2013-09-23 DIAGNOSIS — Z7901 Long term (current) use of anticoagulants: Secondary | ICD-10-CM | POA: Diagnosis not present

## 2013-09-23 DIAGNOSIS — E785 Hyperlipidemia, unspecified: Secondary | ICD-10-CM | POA: Diagnosis not present

## 2013-09-23 DIAGNOSIS — B744 Mansonelliasis: Secondary | ICD-10-CM | POA: Diagnosis not present

## 2013-09-23 DIAGNOSIS — I447 Left bundle-branch block, unspecified: Secondary | ICD-10-CM | POA: Diagnosis not present

## 2013-09-23 DIAGNOSIS — I495 Sick sinus syndrome: Secondary | ICD-10-CM | POA: Diagnosis not present

## 2013-09-23 DIAGNOSIS — M129 Arthropathy, unspecified: Secondary | ICD-10-CM | POA: Diagnosis not present

## 2013-09-23 DIAGNOSIS — K219 Gastro-esophageal reflux disease without esophagitis: Secondary | ICD-10-CM | POA: Diagnosis not present

## 2013-09-23 DIAGNOSIS — I739 Peripheral vascular disease, unspecified: Secondary | ICD-10-CM | POA: Diagnosis not present

## 2013-09-23 DIAGNOSIS — I10 Essential (primary) hypertension: Secondary | ICD-10-CM | POA: Diagnosis not present

## 2013-09-23 DIAGNOSIS — Z4502 Encounter for adjustment and management of automatic implantable cardiac defibrillator: Secondary | ICD-10-CM | POA: Diagnosis not present

## 2013-09-23 DIAGNOSIS — I4891 Unspecified atrial fibrillation: Secondary | ICD-10-CM | POA: Diagnosis not present

## 2013-09-23 DIAGNOSIS — I5022 Chronic systolic (congestive) heart failure: Secondary | ICD-10-CM | POA: Diagnosis not present

## 2013-09-23 DIAGNOSIS — I2510 Atherosclerotic heart disease of native coronary artery without angina pectoris: Secondary | ICD-10-CM | POA: Diagnosis not present

## 2013-09-23 DIAGNOSIS — E039 Hypothyroidism, unspecified: Secondary | ICD-10-CM | POA: Diagnosis not present

## 2013-09-23 DIAGNOSIS — I2589 Other forms of chronic ischemic heart disease: Secondary | ICD-10-CM | POA: Diagnosis not present

## 2013-09-23 DIAGNOSIS — Z87891 Personal history of nicotine dependence: Secondary | ICD-10-CM | POA: Diagnosis not present

## 2013-09-23 DIAGNOSIS — I509 Heart failure, unspecified: Secondary | ICD-10-CM | POA: Diagnosis not present

## 2013-09-23 DIAGNOSIS — I252 Old myocardial infarction: Secondary | ICD-10-CM | POA: Diagnosis not present

## 2013-09-23 MED ORDER — SODIUM CHLORIDE 0.9 % IV SOLN: INTRAVENOUS | Status: DC

## 2013-09-23 MED ORDER — CEFAZOLIN SODIUM-DEXTROSE 2-3 GM-% IV SOLR: 2.0000 g | INTRAVENOUS | Status: DC

## 2013-09-23 MED ORDER — CHLORHEXIDINE GLUCONATE 4 % EX LIQD
60.0000 mL | Freq: Once | CUTANEOUS | Status: DC
  Filled 2013-09-23: qty 60

## 2013-09-23 MED ORDER — SODIUM CHLORIDE 0.9 % IR SOLN
80.0000 mg | Status: DC
  Filled 2013-09-23: qty 2

## 2013-09-24 ENCOUNTER — Encounter (HOSPITAL_COMMUNITY): Payer: Self-pay | Admitting: General Practice

## 2013-09-24 ENCOUNTER — Ambulatory Visit (HOSPITAL_COMMUNITY)
Admission: RE | Admit: 2013-09-24 | Discharge: 2013-09-25 | Disposition: A | Discharge status: Home / Self Care | Payer: Medicare HMO | Source: Ambulatory Visit | Attending: Internal Medicine | Admitting: Internal Medicine

## 2013-09-24 ENCOUNTER — Encounter (HOSPITAL_COMMUNITY)
Admission: RE | Disposition: A | Discharge status: Home / Self Care | Payer: Medicare HMO | Source: Ambulatory Visit | Attending: Internal Medicine

## 2013-09-24 DIAGNOSIS — M129 Arthropathy, unspecified: Secondary | ICD-10-CM | POA: Insufficient documentation

## 2013-09-24 DIAGNOSIS — I739 Peripheral vascular disease, unspecified: Secondary | ICD-10-CM | POA: Insufficient documentation

## 2013-09-24 DIAGNOSIS — Z87891 Personal history of nicotine dependence: Secondary | ICD-10-CM | POA: Insufficient documentation

## 2013-09-24 DIAGNOSIS — I10 Essential (primary) hypertension: Secondary | ICD-10-CM | POA: Insufficient documentation

## 2013-09-24 DIAGNOSIS — Z4502 Encounter for adjustment and management of automatic implantable cardiac defibrillator: Secondary | ICD-10-CM | POA: Insufficient documentation

## 2013-09-24 DIAGNOSIS — I5022 Chronic systolic (congestive) heart failure: Secondary | ICD-10-CM

## 2013-09-24 DIAGNOSIS — I495 Sick sinus syndrome: Secondary | ICD-10-CM | POA: Insufficient documentation

## 2013-09-24 DIAGNOSIS — B744 Mansonelliasis: Secondary | ICD-10-CM | POA: Insufficient documentation

## 2013-09-24 DIAGNOSIS — I2510 Atherosclerotic heart disease of native coronary artery without angina pectoris: Secondary | ICD-10-CM | POA: Insufficient documentation

## 2013-09-24 DIAGNOSIS — I447 Left bundle-branch block, unspecified: Secondary | ICD-10-CM | POA: Insufficient documentation

## 2013-09-24 DIAGNOSIS — K219 Gastro-esophageal reflux disease without esophagitis: Secondary | ICD-10-CM | POA: Insufficient documentation

## 2013-09-24 DIAGNOSIS — I4891 Unspecified atrial fibrillation: Secondary | ICD-10-CM | POA: Insufficient documentation

## 2013-09-24 DIAGNOSIS — Z7901 Long term (current) use of anticoagulants: Secondary | ICD-10-CM | POA: Insufficient documentation

## 2013-09-24 DIAGNOSIS — I2589 Other forms of chronic ischemic heart disease: Secondary | ICD-10-CM

## 2013-09-24 DIAGNOSIS — I509 Heart failure, unspecified: Secondary | ICD-10-CM | POA: Insufficient documentation

## 2013-09-24 DIAGNOSIS — I255 Ischemic cardiomyopathy: Secondary | ICD-10-CM

## 2013-09-24 DIAGNOSIS — I252 Old myocardial infarction: Secondary | ICD-10-CM | POA: Insufficient documentation

## 2013-09-24 DIAGNOSIS — E785 Hyperlipidemia, unspecified: Secondary | ICD-10-CM | POA: Insufficient documentation

## 2013-09-24 DIAGNOSIS — E039 Hypothyroidism, unspecified: Secondary | ICD-10-CM | POA: Insufficient documentation

## 2013-09-24 HISTORY — PX: BI-VENTRICULAR IMPLANTABLE CARDIOVERTER DEFIBRILLATOR  (CRT-D): SHX5747

## 2013-09-24 HISTORY — DX: Pneumonia, unspecified organism: J18.9

## 2013-09-24 HISTORY — PX: BI-VENTRICULAR IMPLANTABLE CARDIOVERTER DEFIBRILLATOR: SHX5459

## 2013-09-24 HISTORY — DX: Presence of automatic (implantable) cardiac defibrillator: Z95.810

## 2013-09-24 LAB — PROTIME-INR
INR: 1.63 — ABNORMAL HIGH (ref 0.00–1.49)
PROTHROMBIN TIME: 19.3 s — AB (ref 11.6–15.2)

## 2013-09-24 SURGERY — BI-VENTRICULAR IMPLANTABLE CARDIOVERTER DEFIBRILLATOR  (CRT-D)
Anesthesia: LOCAL

## 2013-09-24 MED ORDER — VITAMIN C 500 MG PO TABS
500.0000 mg | ORAL_TABLET | Freq: Every day | ORAL | Status: DC
  Administered 2013-09-25: 500 mg via ORAL
  Filled 2013-09-24 (×2): qty 1

## 2013-09-24 MED ORDER — ONDANSETRON HCL 4 MG/2ML IJ SOLN: 4.0000 mg | Freq: Four times a day (QID) | INTRAMUSCULAR | Status: DC | PRN

## 2013-09-24 MED ORDER — CALCIUM CARBONATE 600 MG PO TABS: 600.0000 mg | ORAL_TABLET | Freq: Every day | ORAL | Status: DC

## 2013-09-24 MED ORDER — CEFAZOLIN SODIUM 1-5 GM-% IV SOLN
1.0000 g | Freq: Four times a day (QID) | INTRAVENOUS | Status: AC
  Administered 2013-09-24 – 2013-09-25 (×3): 1 g via INTRAVENOUS
  Filled 2013-09-24 (×3): qty 50

## 2013-09-24 MED ORDER — CALCIUM CARBONATE 1250 (500 CA) MG PO TABS
1.0000 | ORAL_TABLET | Freq: Every day | ORAL | Status: DC
  Administered 2013-09-25: 500 mg via ORAL
  Filled 2013-09-24 (×2): qty 1

## 2013-09-24 MED ORDER — TAMSULOSIN HCL 0.4 MG PO CAPS
0.4000 mg | ORAL_CAPSULE | Freq: Every day | ORAL | Status: DC
  Administered 2013-09-24: 0.4 mg via ORAL
  Filled 2013-09-24 (×2): qty 1

## 2013-09-24 MED ORDER — LORATADINE 10 MG PO TABS
10.0000 mg | ORAL_TABLET | Freq: Every day | ORAL | Status: DC
Start: 2013-09-24 — End: 2013-09-25
  Administered 2013-09-24 – 2013-09-25 (×2): 10 mg via ORAL
  Filled 2013-09-24 (×2): qty 1

## 2013-09-24 MED ORDER — POTASSIUM CHLORIDE CRYS ER 20 MEQ PO TBCR
20.0000 meq | EXTENDED_RELEASE_TABLET | Freq: Every day | ORAL | Status: DC
  Administered 2013-09-24 – 2013-09-25 (×2): 20 meq via ORAL
  Filled 2013-09-24 (×4): qty 1

## 2013-09-24 MED ORDER — PANTOPRAZOLE SODIUM 40 MG PO TBEC
40.0000 mg | DELAYED_RELEASE_TABLET | Freq: Every day | ORAL | Status: DC
  Administered 2013-09-25: 40 mg via ORAL
  Filled 2013-09-24: qty 1

## 2013-09-24 MED ORDER — LEVOTHYROXINE SODIUM 112 MCG PO TABS
112.0000 ug | ORAL_TABLET | Freq: Every day | ORAL | Status: DC
  Administered 2013-09-24 – 2013-09-25 (×2): 112 ug via ORAL
  Filled 2013-09-24 (×3): qty 1

## 2013-09-24 MED ORDER — MAGNESIUM GLUCONATE 500 MG PO TABS
500.0000 mg | ORAL_TABLET | Freq: Every day | ORAL | Status: DC
  Administered 2013-09-24 – 2013-09-25 (×2): 500 mg via ORAL
  Filled 2013-09-24 (×2): qty 1

## 2013-09-24 MED ORDER — ADULT MULTIVITAMIN W/MINERALS CH
1.0000 | ORAL_TABLET | Freq: Every day | ORAL | Status: DC
  Administered 2013-09-25: 1 via ORAL
  Filled 2013-09-24 (×2): qty 1

## 2013-09-24 MED ORDER — ACETAMINOPHEN 325 MG PO TABS: 325.0000 mg | ORAL_TABLET | ORAL | Status: DC | PRN

## 2013-09-24 MED ORDER — MILRINONE IN DEXTROSE 20 MG/100ML IV SOLN
0.2500 ug/kg/min | INTRAVENOUS | Status: DC
  Administered 2013-09-24 – 2013-09-25 (×2): 0.25 ug/kg/min via INTRAVENOUS
  Filled 2013-09-24 (×3): qty 100

## 2013-09-24 MED ORDER — ATORVASTATIN CALCIUM 20 MG PO TABS
20.0000 mg | ORAL_TABLET | Freq: Every day | ORAL | Status: DC
  Administered 2013-09-24: 20 mg via ORAL
  Filled 2013-09-24 (×2): qty 1

## 2013-09-24 MED ORDER — WARFARIN SODIUM 4 MG PO TABS
4.0000 mg | ORAL_TABLET | Freq: Every day | ORAL | Status: DC
  Administered 2013-09-24: 4 mg via ORAL
  Filled 2013-09-24 (×2): qty 1

## 2013-09-24 MED ORDER — B COMPLEX-C PO TABS
1.0000 | ORAL_TABLET | Freq: Every day | ORAL | Status: DC
  Administered 2013-09-24 – 2013-09-25 (×2): 1 via ORAL
  Filled 2013-09-24 (×2): qty 1

## 2013-09-24 MED ORDER — SUPER B COMPLEX PO TABS: 1.0000 | ORAL_TABLET | Freq: Every day | ORAL | Status: DC

## 2013-09-24 MED ORDER — MUPIROCIN 2 % EX OINT
TOPICAL_OINTMENT | Freq: Two times a day (BID) | CUTANEOUS | Status: DC
  Filled 2013-09-24: qty 22

## 2013-09-24 MED ORDER — WARFARIN - PHYSICIAN DOSING INPATIENT: Freq: Every day | Status: DC

## 2013-09-24 MED ORDER — MIDAZOLAM HCL 5 MG/5ML IJ SOLN
INTRAMUSCULAR | Status: AC
  Filled 2013-09-24: qty 5

## 2013-09-24 MED ORDER — ALPRAZOLAM 0.25 MG PO TABS
0.3750 mg | ORAL_TABLET | Freq: Every day | ORAL | Status: DC
  Administered 2013-09-24: 0.375 mg via ORAL
  Filled 2013-09-24: qty 2

## 2013-09-24 MED ORDER — FENTANYL CITRATE 0.05 MG/ML IJ SOLN
INTRAMUSCULAR | Status: AC
  Filled 2013-09-24: qty 2

## 2013-09-24 MED ORDER — LIDOCAINE HCL (PF) 1 % IJ SOLN
INTRAMUSCULAR | Status: AC
  Filled 2013-09-24: qty 60

## 2013-09-24 MED ORDER — NITROGLYCERIN 0.4 MG SL SUBL: 0.4000 mg | SUBLINGUAL_TABLET | SUBLINGUAL | Status: DC | PRN

## 2013-09-24 MED ORDER — LOSARTAN POTASSIUM 25 MG PO TABS
25.0000 mg | ORAL_TABLET | Freq: Every day | ORAL | Status: DC
Start: 2013-09-24 — End: 2013-09-25
  Administered 2013-09-25: 25 mg via ORAL
  Filled 2013-09-24 (×2): qty 1

## 2013-09-24 NOTE — Interval H&P Note (Signed)
History and Physical Interval Note:  09/24/2013 9:20 AM  Trevor Simpson  has presented today for surgery, with the diagnosis of chf  The various methods of treatment have been discussed with the patient and family. After consideration of risks, benefits and other options for treatment, the patient has consented to  Procedure(s): BI-VENTRICULAR IMPLANTABLE CARDIOVERTER DEFIBRILLATOR  (CRT-D) (N/A) as a surgical intervention .  The patient's history has been reviewed, patient examined, no change in status, stable for surgery.  I have reviewed the patient's chart and labs.  Questions were answered to the patient's satisfaction.     Mikle Bosworth.D.

## 2013-09-24 NOTE — Progress Notes (Signed)
Labs from 6/23 ok for procedure today.

## 2013-09-24 NOTE — H&P (View-Only) (Signed)
HPI Trevor Simpson returns today for followup. He is a pleasant 80 yo man with multiple medical problems and worsening chronic systolic heart failure. He underwent right heart cath which demonstrated a low output state and he was place on IV milrinone. He has improved markedly. His heart failure symptoms are class 3A. He has pacing induced LBBB with a QRS duration of 200 ms. He is now referred to consider BiV ICD upgrade. He has not had syncope. Allergies  Allergen Reactions  . Codeine     "tripped out on it"  . Morphine     "tripped out on it"     Current Outpatient Prescriptions  Medication Sig Dispense Refill  . ALPRAZolam (XANAX) 0.25 MG tablet Take 0.25 mg by mouth 3 (three) times daily as needed for sleep or anxiety.       Marland Kitchen amiodarone (PACERONE) 200 MG tablet Take 1 tablet (200 mg total) by mouth daily.      Marland Kitchen atorvastatin (LIPITOR) 20 MG tablet Take 20 mg by mouth daily at 6 PM.       . B Complex-C (SUPER B COMPLEX) TABS Take 1 tablet by mouth daily.       . calcium carbonate (OS-CAL) 600 MG TABS Take 600 mg by mouth daily with breakfast.       . cetirizine (ZYRTEC) 10 MG tablet Take 10 mg by mouth daily.       . fish oil-omega-3 fatty acids 1000 MG capsule Take 1 g by mouth 2 (two) times daily.       . furosemide (LASIX) 80 MG tablet Take 80mg  in the morning and take 40mg  in the evening      . levothyroxine (SYNTHROID, LEVOTHROID) 112 MCG tablet Take 112 mcg by mouth daily.      Marland Kitchen losartan (COZAAR) 25 MG tablet Take 0.5 tablets (12.5 mg total) by mouth daily.  30 tablet  6  . magnesium gluconate (MAGONATE) 500 MG tablet Take 500 mg by mouth daily.       . milrinone (PRIMACOR) 20 MG/100ML SOLN infusion Inject 21.55 mcg/min into the vein continuous.  100 mL  6  . Multiple Vitamin (MULTIVITAMIN WITH MINERALS) TABS Take 1 tablet by mouth daily.      . nitroGLYCERIN (NITROSTAT) 0.4 MG SL tablet Place 1 tablet (0.4 mg total) under the tongue every 5 (five) minutes as needed for  chest pain.  25 tablet  3  . omeprazole (PRILOSEC) 20 MG capsule Take 20 mg by mouth daily as needed (indigestion).       . potassium chloride SA (K-DUR,KLOR-CON) 20 MEQ tablet Take 1 tablet (20 mEq total) by mouth daily.  30 tablet  6  . Saw Palmetto, Serenoa repens, (SAW PALMETTO PO) Take 1 capsule by mouth 2 (two) times daily.      . tamsulosin (FLOMAX) 0.4 MG CAPS capsule Take 0.4 mg by mouth daily after supper.       . vitamin C (ASCORBIC ACID) 500 MG tablet Take 500 mg by mouth daily.        Marland Kitchen warfarin (COUMADIN) 4 MG tablet Take 4 mg by mouth daily.      . [DISCONTINUED] diltiazem (CARDIZEM CD) 120 MG 24 hr capsule        No current facility-administered medications for this visit.     Past Medical History  Diagnosis Date  . Coronary artery disease   . Other and unspecified hyperlipidemia   . Hypertension   . Atrial  fibrillation   . Sinoatrial node dysfunction   . PVD (peripheral vascular disease)     s/p renal artery stent 20-04  . Pacemaker st Jude   . AAA (abdominal aortic aneurysm)     repaired  . Systolic congestive heart failure   . Cardiomyopathy   . Carotid artery occlusion   . Atrial fibrillation   . Myocardial infarction   . Stroke     left sided affected no residual effects  . GERD (gastroesophageal reflux disease)   . Arthritis   . Hypothyroidism     ROS:   All systems reviewed and negative except as noted in the HPI.   Past Surgical History  Procedure Laterality Date  . Inguinal hernia repair      left  . Cardiac pacemaker placement  08/01/2009    st jude dual chamber  . Colonoscopy    . Esophagogastroduodenoscopy    . Translumminal angioplasty    . Tee without cardioversion N/A 09/23/2012    Procedure: TRANSESOPHAGEAL ECHOCARDIOGRAM (TEE);  Surgeon: Lelon Perla, MD;  Location: [facility] ENDOSCOPY;  Service: Cardiovascular;  Laterality: N/A;  . Cardioversion N/A 09/23/2012    Procedure: CARDIOVERSION;  Surgeon: Lelon Perla, MD;  Location: [facility]  ENDOSCOPY;  Service: Cardiovascular;  Laterality: N/A;  . Renal artery stent    . Abdominal aortic aneurysm repair    . Cardiac catheterization  08/2012    Stents  . Endarterectomy Right 11/25/2012    Procedure: ENDARTERECTOMY CAROTID With Dacron Patch Angioplasty;  Surgeon: Elam Dutch, MD;  Location: [facility] OR;  Service: Vascular;  Laterality: Right;  . Carotid endarterectomy Right 11-25-12     Family History  Problem Relation Age of Onset  . CAD Father   . Hypertension Father   . Heart disease Father   . CAD Brother   . Heart disease Brother   . Heart attack Brother   . Diabetes Brother   . Lung cancer Sister   . Heart disease Sister   . Hypertension Sister   . Hypertension Mother   . Hypertension Daughter   . Cancer Son      History   Social History  . Marital Status: Married    Spouse Name: N/A    Number of Children: N/A  . Years of Education: N/A   Occupational History  . Not on file.   Social History Main Topics  . Smoking status: Former Smoker -- 1.00 packs/day for 40 years    Types: Cigarettes    Quit date: 03/19/1993  . Smokeless tobacco: Current User    Types: Chew  . Alcohol Use: No  . Drug Use: No  . Sexual Activity: Not Currently   Other Topics Concern  . Not on file   Social History Narrative  . No narrative on file     BP 104/53  Pulse 60  Ht 6' (1.829 m)  Wt 182 lb 9.6 oz (82.827 kg)  BMI 24.76 kg/m2  Physical Exam:  Stable appearing 80 yo man, NAD HEENT: Unremarkable Neck:  No JVD, no thyromegally Back:  No CVA tenderness Lungs:  Clear except for minimal basilar rales. HEART:  Regular rate rhythm, no murmurs, no rubs, no clicks Abd:  soft, positive bowel sounds, no organomegally, no rebound, no guarding Ext:  2 plus pulses, no edema, no cyanosis, no clubbing Skin:  No rashes no nodules Neuro:  CN II through XII intact, motor grossly intact   DEVICE  Normal device function.  See PaceArt for  details.   Assess/Plan:

## 2013-09-25 ENCOUNTER — Ambulatory Visit (HOSPITAL_COMMUNITY): Payer: Medicare HMO

## 2013-09-25 DIAGNOSIS — I509 Heart failure, unspecified: Secondary | ICD-10-CM | POA: Diagnosis not present

## 2013-09-25 DIAGNOSIS — I2589 Other forms of chronic ischemic heart disease: Secondary | ICD-10-CM

## 2013-09-25 DIAGNOSIS — I5022 Chronic systolic (congestive) heart failure: Secondary | ICD-10-CM | POA: Diagnosis not present

## 2013-09-25 DIAGNOSIS — Z4502 Encounter for adjustment and management of automatic implantable cardiac defibrillator: Secondary | ICD-10-CM | POA: Diagnosis not present

## 2013-09-25 LAB — PROTIME-INR
INR: 1.51 — ABNORMAL HIGH (ref 0.00–1.49)
PROTHROMBIN TIME: 18.2 s — AB (ref 11.6–15.2)

## 2013-09-25 MED ORDER — SODIUM CHLORIDE 0.9 % IJ SOLN
10.0000 mL | Freq: Two times a day (BID) | INTRAMUSCULAR | Status: DC
Start: 2013-09-25 — End: 2013-09-25

## 2013-09-25 MED ORDER — SODIUM CHLORIDE 0.9 % IJ SOLN
10.0000 mL | INTRAMUSCULAR | Status: DC | PRN
  Administered 2013-09-25: 10 mL

## 2013-09-25 NOTE — Discharge Instructions (Signed)
° °  Supplemental Discharge Instructions for  Pacemaker/Defibrillator Patients  Activity No heavy lifting or vigorous activity with your left/right arm for 6 to 8 weeks.  Do not raise your left/right arm above your head for one week.  Gradually raise your affected arm as drawn below.                       07/12                   07/13                    07/14                    07/15            NO DRIVING for  one week    ; you may begin driving on     81/01     . WOUND CARE   Keep the wound area clean and dry.  Do not get this area wet for one week. No showers for one week; you may shower on      07/16        .   The tape/steri-strips on your wound will fall off; do not pull them off.  No bandage is needed on the site.  DO  NOT apply any creams, oils, or ointments to the wound area.   If you notice any drainage or discharge from the wound, any swelling or bruising at the site, or you develop a fever > 101? F after you are discharged home, call the office at once.  Special Instructions   You are still able to use cellular telephones; use the ear opposite the side where you have your pacemaker/defibrillator.  Avoid carrying your cellular phone near your device.   When traveling through airports, show security personnel your identification card to avoid being screened in the metal detectors.  Ask the security personnel to use the hand wand.   Avoid arc welding equipment, MRI testing (magnetic resonance imaging), TENS units (transcutaneous nerve stimulators).  Call the office for questions about other devices.   Avoid electrical appliances that are in poor condition or are not properly grounded.   Microwave ovens are safe to be near or to operate.  Additional information for defibrillator patients should your device go off:   If your device goes off ONCE and you feel fine afterward, notify the device clinic nurses.   If your device goes off ONCE and you do not feel well afterward, call 911.    If your device goes off TWICE, call 911.   If your device goes off THREE times in one day, call 911.  DO NOT DRIVE YOURSELF OR A FAMILY MEMBER WITH A DEFIBRILLATOR TO THE HOSPITAL--CALL 911.

## 2013-09-25 NOTE — Progress Notes (Signed)
Pt's home Milranone pump reconnected to pt. Pam with [facility] made aware. Carroll Kinds RN

## 2013-09-25 NOTE — Discharge Summary (Signed)
ELECTROPHYSIOLOGY PROCEDURE DISCHARGE SUMMARY    Patient ID: Trevor Simpson,  MRN: [id number], DOB/AGE: 78-Mar-1935 78 y.o.  Admit date: 09/24/2013 Discharge date: 09/25/2013  Primary Care Physician: [name], MD Primary Cardiologist: [place] Electrophysiologist: Lovena Le  Primary Discharge Diagnosis:  Class III congestive heart failure and ischemic cardiomyopathy s/p upgrade of previously implanted pacemaker to CRTD this admission  Secondary Discharge Diagnosis:  1.  CAD 2.  Hypertension 3.  Sinus node dysfunction s/p PPM implant May 2011 4.  Atrial fibrillation 5.  Prior CVA 6.  Hypothyroidism  Allergies  Allergen Reactions  . Codeine     "tripped out on it"  . Morphine     "tripped out on it"     Procedures This Admission:  1.  Upgrade of a previously implanted dual chamber pacemaker to STJ CRTD by Dr Lovena Le on 09-24-2013.  See op note for full details.  2.  CXR on 09-25-2013 demonstrated no pneumothorax status post device implantation.   Brief HPI: Trevor Simpson is a 80 y.o. male was referred to electrophysiology in the outpatient setting for consideration of CRTD implantation.  Past medical history includes ischemic cardiomyopathy, congestive heart failure and pacing induced LBBB.  The patient has persistent LV dysfunction despite guideline directed therapy.  Risks, benefits, and alternatives to CRTD upgrade were reviewed with the patient who wished to proceed.   Hospital Course:  The patient was admitted and underwent upgrade of his previously implanted dual chamber pacemaker to STJ CRTD with details as outlined above.   He was monitored on telemetry overnight which demonstrated AV pacing.  Left chest was without hematoma or ecchymosis.  The device was interrogated and found to be functioning normally.  CXR was obtained and demonstrated no pneumothorax status post device implantation.  Wound care, arm mobility, and restrictions were reviewed with the patient.  Dr  Rayann Heman examined the patient and considered them stable for discharge to home.   The patient's discharge medications include an ARB (Losartan).  He is unable to tolerate beta blockers due to hypotension.   Discharge Vitals: Blood pressure 132/63, pulse 70, temperature 98 F (36.7 C), temperature source Oral, resp. rate 18, height 6' (1.829 m), weight 178 lb 3.2 oz (80.831 kg), SpO2 94.00%.  Physical Exam: Filed Vitals:   09/24/13 1400 09/24/13 1500 09/24/13 2041 09/25/13 0300  BP: 115/56 128/59 129/60 132/63  Pulse: 60 60 60 70  Temp:   97.4 F (36.3 C) 98 F (36.7 C)  TempSrc:   Oral Oral  Resp:   18 18  Height:      Weight:    178 lb 3.2 oz (80.831 kg)  SpO2:   99% 94%    GEN- The patient is well appearing, alert and oriented x 3 today.   Head- normocephalic, atraumatic Eyes-  Sclera clear, conjunctiva pink Ears- hearing intact Oropharynx- clear Neck- supple,   Lungs- Clear to ausculation bilaterally, normal work of breathing Chest- ICD pocket is without hematoma Heart- Regular rate and rhythm, no murmurs, rubs or gallops, PMI not laterally displaced GI- soft, NT, ND, + BS Extremities- no clubbing, cyanosis, or edema Neuro- strength and sensation are intact  ICD interrogation- reviewed in detail today (see paper chart)   Labs:   Lab Results  Component Value Date   WBC 6.6 09/08/2013   HGB 12.9* 09/08/2013   HCT 40.3 09/08/2013   MCV 90.5 09/08/2013   PLT 180.0 09/08/2013     Discharge Medications:    Medication List  ASK your doctor about these medications       ALPRAZolam 0.25 MG tablet  Commonly known as:  XANAX  Take 0.375 mg by mouth at bedtime.     atorvastatin 20 MG tablet  Commonly known as:  LIPITOR  Take 20 mg by mouth daily at 6 PM.     calcium carbonate 600 MG Tabs tablet  Commonly known as:  OS-CAL  Take 600 mg by mouth daily with breakfast.     cetirizine 10 MG tablet  Commonly known as:  ZYRTEC  Take 10 mg by mouth daily.     Fish Oil  1200 MG Caps  Take 1,200 mg by mouth 2 (two) times daily.     furosemide 80 MG tablet  Commonly known as:  LASIX  Take 80mg  in the morning and take 40mg  in the evening     JOCK ITCH EX  Apply 1 application topically daily.     levothyroxine 112 MCG tablet  Commonly known as:  SYNTHROID, LEVOTHROID  Take 112 mcg by mouth daily.     losartan 25 MG tablet  Commonly known as:  COZAAR  Take 25 mg by mouth daily.     magnesium gluconate 500 MG tablet  Commonly known as:  MAGONATE  Take 500 mg by mouth daily.     milrinone 20 MG/100ML Soln infusion  Commonly known as:  PRIMACOR  Inject 21.55 mcg/min into the vein continuous.     multivitamin with minerals Tabs tablet  Take 1 tablet by mouth daily.     nitroGLYCERIN 0.4 MG SL tablet  Commonly known as:  NITROSTAT  Place 1 tablet (0.4 mg total) under the tongue every 5 (five) minutes as needed for chest pain.     omeprazole 20 MG capsule  Commonly known as:  PRILOSEC  Take 20 mg by mouth daily as needed (indigestion).     potassium chloride SA 20 MEQ tablet  Commonly known as:  K-DUR,KLOR-CON  Take 1 tablet (20 mEq total) by mouth daily.     SAW PALMETTO PO  Take 1 capsule by mouth 2 (two) times daily.     SUPER B COMPLEX Tabs  Take 1 tablet by mouth daily.     tamsulosin 0.4 MG Caps capsule  Commonly known as:  FLOMAX  Take 0.4 mg by mouth daily after supper.     vitamin C 500 MG tablet  Commonly known as:  ASCORBIC ACID  Take 500 mg by mouth daily.     warfarin 4 MG tablet  Commonly known as:  COUMADIN  Take 4 mg by mouth daily.        Disposition:     Duration of Discharge Encounter: Greater than 30 minutes including physician time.  Signed,  Thompson Grayer MD

## 2013-09-26 NOTE — H&P (Signed)
  ICD Criteria  Current LVEF:15% ;Obtained > or = 1 month ago and < or = 3 months ago.  NYHA Functional Classification: Class III  Heart Failure History:  Yes, Duration of heart failure since onset is > 9 months  Non-Ischemic Dilated Cardiomyopathy History:  No.  Atrial Fibrillation/Atrial Flutter:  Yes, A-Fib/A-Flutter type: Paroxysmal.  Ventricular Tachycardia History:  No.  Cardiac Arrest History:  No  History of Syndromes with Risk of Sudden Death:  No.  Previous ICD:  No.  Electrophysiology Study: No.  Prior MI: Yes, Most recent MI timeframe is > 40 days.  PPM: Yes, PPM type is: Dual Chamber.  OSA:  No  Patient Life Expectancy of >=1 year: Yes.  Anticoagulation Therapy:  Patient is on anticoagulation therapy, anticoagulation was held prior to procedure.   Beta Blocker Therapy:  No, Reason not on Beta Blocker therapy: hypotension  Ace Inhibitor/ARB Therapy:  Yes.

## 2013-09-26 NOTE — CV Procedure (Signed)
SURGEON:  Cristopher Peru, MD      PREPROCEDURE DIAGNOSES:   1.Ischemic cardiomyopathy.   2. New York Heart Association class III, heart failure chronically.   3. Left bundle-branch block (pacing induced).      POSTPROCEDURE DIAGNOSES:   1. Ischemic cardiomyopathy.   2. New York Heart Association class III heart failure chronically.   3. Left bundle-branch block.      PROCEDURES:    1. Biventricular ICD implantation.  2. Venography of the left upper extremity and coronary sinus     INTRODUCTION:  Trevor Simpson is a 80 y.o. male with a ischemic CM (EF 15%), NYHA Class III CHF, and pacing induced LBBB QRS morophology with underlying CHB. At this time, he meets MADIT II/ SCD-HeFT criteria for ICD implantation for primary prevention of sudden death.  Given LBBB, the patient may also be expected to benefit from resynchronization therapy. The patient has been treated with an optimal medical regimen including IV milrinone but continues to have a depressed ejection fraction and NYHA Class III CHF symptoms.  he therefore  presents today for a biventricular ICD implantation and removal of his old DDD PM.     DESCRIPTION OF PROCEDURE:  Informed written consent was obtained and the   patient was brought to the electrophysiology lab in the fasting state. The patient was adequately sedated with intravenous Versed and Fentanyl as outlined in the nursing report.  The patient's left chest was prepped and draped in the usual sterile fashion by the EP lab staff.  The skin overlying the left deltopectoral region was infiltrated with lidocaine for local analgesia.  A 6-cm incision was made over the left deltopectoral region.  A left subcutaneous defibrillator pocket was fashioned using a combination of sharp and blunt dissection.  Electrocautery was used to assure hemostasis the old PPM was removed and the old RV lead was capped and the old PPM was expanded.   Left Upper extremity Venography:  A venogram of the  left upper extremity was performed which revealed a moderate sized left axillary vein which emptied into a moderate sized left subclavian vein.    RA/RV Lead Placement: The left axillary vein was cannulated with fluoroscopic visualization. Through the left axillary vein, a St. Jude [id number] (serial number [id number]) right ventricular defibrillator lead was advanced with fluoroscopic visualization into the right ventricular apical septal position. The right ventricular lead R-wave measured 30 mV with impedance of 670 ohms and a threshold of 0.5 volts at 0.5 milliseconds.   LV Lead Placement:  A St. Jude guide was advanced through the left axillary vein into the low lateral right atrium. A 6 french hexapolar EP catheter was introduced through the [place]. Jude guide and used to cannulate the coronary sinus. A coronary sinus nonselective venogram was performed by hand injection of nonionic contrast. This demonstrated a medium sized posterior vein, and a small posterolateral and high lateral vein.  A 0.014 angioplasty guide wire was introduced through the St. Jude Guide and advanced into the posterior vein after attempts to place the lead in the more lateral veins was unsuccessful. A St. Jude(serial number [id number]) lead was advanced through the posterior vein. This was approximately two-thirds from the base to the apex in a very lateral position. In this location with LV 4 to coil bipolar configuration, the left ventricular lead R-waves measured 18 mV with impedance of 666 ohms and a threshold of 1.0 volt at 0.5 Milliseconds with no diaphragmatic stimulation observed when pacing at 10  volts output. The St. Jude guide was therefore removed.  All three leads were secured to the pectoralis fascia using #2 silk suture over the suture sleeves. The pocket then irrigated with copious gentamicin solution.   Device Placement: The leads were then  connected to a St. Jude (serial  Number [id number]) biventricular ICD.  The  defibrillator was placed into the  Pocket. Anti-biotic irrigation was utilized to irrigate the pocket.  The pocket was then closed in 2 layers with 2.0 Vicryl suture for the subcutaneous and subcuticular layers.  Steri-Strips and a sterile dressing were then applied.     CONCLUSIONS:   1.ischemic cardiomyopathy with Left bundle-branch block and chronic New York Heart Association class III heart failure.   2. Successful biventricular ICD upgrade with removal of an old DDD PM and capping of the old RV pacing lead.   3.  No early apparent complications.   Cristopher Peru, MD  1:51 PM 09/26/2013

## 2013-09-29 ENCOUNTER — Telehealth: Payer: Self-pay | Admitting: Internal Medicine

## 2013-09-29 NOTE — Telephone Encounter (Signed)
New message    Pt got a defibulator last thurs.  Now his heart is beating real fast--bp 86/56 and HR 126. He says he feels weak

## 2013-09-29 NOTE — Telephone Encounter (Signed)
Decreased Lasix to 40 QD per GT.

## 2013-09-30 ENCOUNTER — Telehealth (HOSPITAL_COMMUNITY): Payer: Self-pay

## 2013-09-30 NOTE — Telephone Encounter (Signed)
Returned patient's call concerning message he left on our voice mail.  Stated he had a brief "couple seconds of chest pain and tightness" that went away but scared him at the time when he called.  Was not doing anything at the time.  Informed him it would be ebst to come to ED to be checked out for possible blockage/MI, states if this happens again he will call 911 and be brought in to be seen.

## 2013-10-05 ENCOUNTER — Ambulatory Visit (HOSPITAL_COMMUNITY)
Admission: RE | Admit: 2013-10-05 | Discharge: 2013-10-05 | Disposition: A | Discharge status: Home / Self Care | Payer: Medicare HMO | Source: Ambulatory Visit | Attending: Cardiology | Admitting: Cardiology

## 2013-10-05 ENCOUNTER — Encounter (HOSPITAL_COMMUNITY): Payer: Self-pay

## 2013-10-05 ENCOUNTER — Encounter: Payer: Self-pay | Admitting: Internal Medicine

## 2013-10-05 ENCOUNTER — Ambulatory Visit (INDEPENDENT_AMBULATORY_CARE_PROVIDER_SITE_OTHER): Payer: Medicare HMO | Admitting: *Deleted

## 2013-10-05 VITALS — BP 110/62 | HR 70 | Wt 182.4 lb

## 2013-10-05 DIAGNOSIS — I509 Heart failure, unspecified: Secondary | ICD-10-CM | POA: Diagnosis not present

## 2013-10-05 DIAGNOSIS — J449 Chronic obstructive pulmonary disease, unspecified: Secondary | ICD-10-CM | POA: Diagnosis not present

## 2013-10-05 DIAGNOSIS — I480 Paroxysmal atrial fibrillation: Secondary | ICD-10-CM

## 2013-10-05 DIAGNOSIS — I2510 Atherosclerotic heart disease of native coronary artery without angina pectoris: Secondary | ICD-10-CM

## 2013-10-05 DIAGNOSIS — Z7901 Long term (current) use of anticoagulants: Secondary | ICD-10-CM | POA: Insufficient documentation

## 2013-10-05 DIAGNOSIS — Z885 Allergy status to narcotic agent status: Secondary | ICD-10-CM | POA: Diagnosis not present

## 2013-10-05 DIAGNOSIS — I4891 Unspecified atrial fibrillation: Secondary | ICD-10-CM | POA: Diagnosis not present

## 2013-10-05 DIAGNOSIS — I2589 Other forms of chronic ischemic heart disease: Secondary | ICD-10-CM

## 2013-10-05 DIAGNOSIS — J4489 Other specified chronic obstructive pulmonary disease: Secondary | ICD-10-CM | POA: Insufficient documentation

## 2013-10-05 DIAGNOSIS — I495 Sick sinus syndrome: Secondary | ICD-10-CM

## 2013-10-05 DIAGNOSIS — I5022 Chronic systolic (congestive) heart failure: Secondary | ICD-10-CM

## 2013-10-05 DIAGNOSIS — I255 Ischemic cardiomyopathy: Secondary | ICD-10-CM

## 2013-10-05 HISTORY — DX: Ischemic cardiomyopathy: I25.5

## 2013-10-05 LAB — MDC_IDC_ENUM_SESS_TYPE_INCLINIC
Battery Remaining Longevity: 44.4 mo
Brady Statistic RV Percent Paced: 99.5 %
Date Time Interrogation Session: 20150720131736
HIGH POWER IMPEDANCE MEASURED VALUE: 66 Ohm
Lead Channel Impedance Value: 287.5 Ohm
Lead Channel Impedance Value: 475 Ohm
Lead Channel Pacing Threshold Amplitude: 0.5 V
Lead Channel Pacing Threshold Amplitude: 2 V
Lead Channel Pacing Threshold Pulse Width: 0.5 ms
Lead Channel Pacing Threshold Pulse Width: 0.5 ms
Lead Channel Pacing Threshold Pulse Width: 0.5 ms
Lead Channel Pacing Threshold Pulse Width: 0.5 ms
Lead Channel Pacing Threshold Pulse Width: 0.9 ms
Lead Channel Pacing Threshold Pulse Width: 0.9 ms
Lead Channel Sensing Intrinsic Amplitude: 3.2 mV
Lead Channel Setting Pacing Amplitude: 3.5 V
Lead Channel Setting Sensing Sensitivity: 0.5 mV
MDC IDC MSMT LEADCHNL LV PACING THRESHOLD AMPLITUDE: 1.75 V
MDC IDC MSMT LEADCHNL LV PACING THRESHOLD AMPLITUDE: 2 V
MDC IDC MSMT LEADCHNL RA IMPEDANCE VALUE: 362.5 Ohm
MDC IDC MSMT LEADCHNL RA PACING THRESHOLD AMPLITUDE: 1.5 V
MDC IDC MSMT LEADCHNL RA PACING THRESHOLD AMPLITUDE: 1.5 V
MDC IDC MSMT LEADCHNL RV PACING THRESHOLD AMPLITUDE: 0.5 V
MDC IDC MSMT LEADCHNL RV PACING THRESHOLD PULSEWIDTH: 0.5 ms
MDC IDC MSMT LEADCHNL RV SENSING INTR AMPL: 11.8 mV
MDC IDC PG SERIAL: 7201810
MDC IDC SET LEADCHNL LV PACING PULSEWIDTH: 0.5 ms
MDC IDC SET LEADCHNL RA PACING AMPLITUDE: 2.5 V
MDC IDC SET LEADCHNL RV PACING AMPLITUDE: 3.5 V
MDC IDC SET LEADCHNL RV PACING PULSEWIDTH: 0.5 ms
MDC IDC SET ZONE DETECTION INTERVAL: 250 ms
MDC IDC STAT BRADY RA PERCENT PACED: 43 %
Zone Setting Detection Interval: 315 ms

## 2013-10-05 LAB — CARBOXYHEMOGLOBIN
CARBOXYHEMOGLOBIN: 1.3 % (ref 0.5–1.5)
Methemoglobin: 0.8 % (ref 0.0–1.5)
O2 SAT: 66.5 %
Total hemoglobin: 14 g/dL (ref 13.5–18.0)

## 2013-10-05 LAB — COMPREHENSIVE METABOLIC PANEL
ALT: 19 U/L (ref 0–53)
AST: 32 U/L (ref 0–37)
Albumin: 3.2 g/dL — ABNORMAL LOW (ref 3.5–5.2)
Alkaline Phosphatase: 86 U/L (ref 39–117)
Anion gap: 12 (ref 5–15)
BUN: 16 mg/dL (ref 6–23)
CALCIUM: 8.8 mg/dL (ref 8.4–10.5)
CO2: 27 mEq/L (ref 19–32)
Chloride: 100 mEq/L (ref 96–112)
Creatinine, Ser: 1.05 mg/dL (ref 0.50–1.35)
GFR calc Af Amer: 76 mL/min — ABNORMAL LOW (ref >90)
GFR calc non Af Amer: 66 mL/min — ABNORMAL LOW (ref >90)
GLUCOSE: 94 mg/dL (ref 70–99)
Potassium: 4.3 mEq/L (ref 3.7–5.3)
SODIUM: 139 meq/L (ref 137–147)
TOTAL PROTEIN: 7.3 g/dL (ref 6.0–8.3)
Total Bilirubin: 0.7 mg/dL (ref 0.3–1.2)

## 2013-10-05 LAB — TSH: TSH: 0.748 u[IU]/mL (ref 0.350–4.500)

## 2013-10-05 MED ORDER — LOSARTAN POTASSIUM 25 MG PO TABS: 25.0000 mg | ORAL_TABLET | Freq: Two times a day (BID) | ORAL | Status: DC

## 2013-10-05 MED ORDER — DIGOXIN 125 MCG PO TABS: 0.1250 mg | ORAL_TABLET | Freq: Every day | ORAL | Status: DC

## 2013-10-05 MED ORDER — MILRINONE IN DEXTROSE 20 MG/100ML IV SOLN: 0.1250 ug/kg/min | INTRAVENOUS | Status: DC

## 2013-10-05 NOTE — Progress Notes (Signed)
Wound check appointment for CRT-D upgrade. Steri-strips removed. Wound without redness or edema. Incision edges approximated, wound well healed. Normal device function. Thresholds, sensing, and impedances consistent with implant measurements for RA&RV---LV threshold elevated. Attempted multiple vectors, nominal setting still ideal vector. Device programmed at 3.5V for extra safety margin until 3 month visit for RV&LV leads, chronic settings for RA lead. Histogram distribution appropriate for patient and level of activity. No mode switches or ventricular arrhythmias noted. Patient educated about wound care, arm mobility, lifting restrictions, shock plan. ROV w/ Dr. Lovena Le 01/06/14.

## 2013-10-05 NOTE — Patient Instructions (Signed)
Increase Losartan to 25 mg Twice daily   Start Digoxin 0.125 mg daily  Stop Potassium  [place] will decrease your dose of Milrinone  Your physician recommends that you schedule a follow-up appointment in: 2 weeks

## 2013-10-05 NOTE — Progress Notes (Signed)
Patient ID: DENVER BENTSON, male   DOB: August 18, 1933, 80 y.o.   MRN: [id number]  PCP: Dr Maudie Mercury Pulmonology: Dr Chase Caller Cardiology: Dr Lovena Le  HPI: Mr Donaldson is a 80 year old with history of  COPD, symptomatic bradycardia, PAF, HTN, PAD, CVA, chronic systolic heart failure and ICM s/p CRT-D.  RHC/LHC 08/2012  RA 12/14 with a mean of 11 mmHg  RV 38/11 mmHg  PA 41/23 with a mean of 29 mmHg  PCWP 20 over 20 with a mean of 15 mmHg  LV 101/13 mmHg  AO 108/52 with a mean of 71 mmHg  Cardiac Output (Fick) 4.3 L per minute  Cardiac Index (Fick) 2.0 L per minute per meter square  Left mainstem: The left main is long with 20% stenosis distally.  Left anterior descending (LAD): The left anterior descending artery is moderately calcified in the proximal vessel. There is diffuse disease in the proximal vessel up to 30%. The first diagonal is relatively small and has mild disease proximally. The ramus intermediate branch is a large branch that bifurcates in the mid vessel. The proximal vessel has a 90-95% stenosis.  Left circumflex (LCx): The left circumflex is relatively small. It gives rise to a single marginal branch and then continues in the AV groove. The first marginal branch has diffuse 60-70% stenosis proximally.  Right coronary artery (RCA): The right coronary is occluded proximally. There are right to right and left to right collaterals. Severe two-vessel obstructive coronary disease. Chronic total occlusion of the right coronary Successful stenting of the ramus intermediate branch with a bare-metal stent  He was treated for RUL pneumonia 12/14. He developed hemoptysis and his PCP stopped Xarelto and placed him on levaquin.  At that time he was referred to pulmonary. [name] Stopped levaquin and started cipro and clindamycin and coumadin. F/u CXR has shown persistent RUL opacity and there is a question if this might be asymmetric edema.  However, CT showed scarring pattern likely from prior  PNA.  After last appointment, patient had RHC (see below) in 6/15: Mean RA 11 PA 53/24  Mean PCWP 22 CI 1.5  Echo (6/15): EF 15%, moderate central MR, mild to moderately decreased RV systolic function.   Follow up for Heart failure: Since last visit underwent CRT-D. Dr. Lovena Le cut lasix back to 40 mg daily because of symptomatic hypotension. Feels better with cut in lasix and BP much better. SBP now 110-120s. Weight at home 177 lbs. One day last week when having low BP had mild CP but not since. Able to walk all the way around [employer] with no issues. Denies orthopnea, PND or edema.  Co-ox was 66.5% today.   Labs:  09/28/12 AST 27 ALT 21  02/10/13 K 3.8 Creatinine  1.41  02/26/13 Pro BNP 1100 07/06/12: K 3.9, creatinine 1.4, BUN 22 6/15: K 4.1, creatinine 1.2 09/2013: K 3.7, creatinine 1.36, HCT 43.7, co-ox 66.5%  SH: Lives at home with wife  FH: Father, Brother - CAD HTN, DM        Sister - lung cancer    ROS: All systems reviewed and negative except as per HPI.   Current Outpatient Prescriptions  Medication Sig Dispense Refill  . ALPRAZolam (XANAX) 0.25 MG tablet Take 0.375 mg by mouth at bedtime.      Marland Kitchen atorvastatin (LIPITOR) 20 MG tablet Take 20 mg by mouth daily at 6 PM.       . B Complex-C (SUPER B COMPLEX) TABS Take 1 tablet by mouth  daily.       . calcium carbonate (OS-CAL) 600 MG TABS Take 600 mg by mouth daily with breakfast.       . cetirizine (ZYRTEC) 10 MG tablet Take 10 mg by mouth daily.       . Clotrimazole (JOCK ITCH EX) Apply 1 application topically daily.      . furosemide (LASIX) 80 MG tablet Take 80mg  in the morning and take 40mg  in the evening      . levothyroxine (SYNTHROID, LEVOTHROID) 112 MCG tablet Take 112 mcg by mouth daily.      Marland Kitchen losartan (COZAAR) 25 MG tablet Take 25 mg by mouth daily.      . magnesium gluconate (MAGONATE) 500 MG tablet Take 500 mg by mouth daily.       . milrinone (PRIMACOR) 20 MG/100ML SOLN infusion Inject 21.55 mcg/min into the  vein continuous.  100 mL  6  . Multiple Vitamin (MULTIVITAMIN WITH MINERALS) TABS Take 1 tablet by mouth daily.      . nitroGLYCERIN (NITROSTAT) 0.4 MG SL tablet Place 1 tablet (0.4 mg total) under the tongue every 5 (five) minutes as needed for chest pain.  25 tablet  3  . Omega-3 Fatty Acids (FISH OIL) 1200 MG CAPS Take 1,200 mg by mouth 2 (two) times daily.      Marland Kitchen omeprazole (PRILOSEC) 20 MG capsule Take 20 mg by mouth daily as needed (indigestion).       . potassium chloride SA (K-DUR,KLOR-CON) 20 MEQ tablet Take 1 tablet (20 mEq total) by mouth daily.  30 tablet  6  . Saw Palmetto, Serenoa repens, (SAW PALMETTO PO) Take 1 capsule by mouth 2 (two) times daily.      . tamsulosin (FLOMAX) 0.4 MG CAPS capsule Take 0.4 mg by mouth daily after supper.       . vitamin C (ASCORBIC ACID) 500 MG tablet Take 500 mg by mouth daily.        Marland Kitchen warfarin (COUMADIN) 4 MG tablet Take 4 mg by mouth daily.      . [DISCONTINUED] diltiazem (CARDIZEM CD) 120 MG 24 hr capsule        No current facility-administered medications for this encounter.     Allergies  Allergen Reactions  . Codeine     "tripped out on it"  . Morphine     "tripped out on it"    Filed Vitals:   10/05/13 1028  BP: 110/62  Pulse: 70  Weight: 182 lb 6.4 oz (82.736 kg)  SpO2: 96%    PHYSICAL EXAM: General:  Elderly frail appearing. No respiratory difficulty. Wife and daughter in law present.  HEENT: normal Neck: supple. JVP 7; Carotids 2+ bilat; no bruits. No lymphadenopathy or thryomegaly appreciated. R neck scar Cor: Distant heart sounds. Regular rate & rhythm.  2/6 HSM apex.   Lungs: distant heart sounds. clear Abdomen: soft, nontender, nondistended. No hepatosplenomegaly. No bruits or masses. Good bowel sounds. Extremities: no cyanosis, clubbing, rash, no edema Neuro: alert & oriented x 3, cranial nerves grossly intact. moves all 4 extremities w/o difficulty. Affect pleasant.   ASSESSMENT & PLAN:  1. Chronic Systolic  Heart Failure: ICM s/p CRT-D; Echo 6/15 with EF 15%, moderate MR, mild to moderately decreased RV systolic function.  RHC in 6/15 showed CI 1.5 so patient was started on milrinone.  He remains on this medication at 0.25 via PICC line.  He is symptomatically improved since CRT upgrade.  - NYHA II symptoms and volume status  stable. He is now on lasix 40 mg daily which has been recently cut back. - Will check co-ox today and decrease milrinone to 0.125. If he tolerates then will try to turn off next visit. Not currently on BB with milrinone. If we are able to get milrinone off would like to try and get low dose BB back on.   - Will add digoxin 0.125 since we are cutting back milrinone.  - Increase losartan to 25 mg BID. Check BMET weekly with milrinone.  - Reinforced the need and importance of daily weights, a low sodium diet, and fluid restriction (less than 2 L a day). Instructed to call the HF clinic if weight increases more than 3 lbs overnight or 5 lbs in a week.  2. PAF: S/P DC-CV 09/2012, on amiodarone 200 mg daily. Switched fromt xarelto to coumadin due to hemoptysis in setting of PNA. [facility].  Check LFTs and TSH today. Patient should get yearly eye exam with amiodarone use.  CBC drawn today.  3. CAD: S/P BMS ramus intermediate branch with a bare-metal stent. No evidence of ischemia. Continue statin and ARB. No ASA while on Coumadin.   F/U 2 weeks Junie Bame B,MD 10/05/2013  Patient seen with NP, agree with the above note.  Co-ox 66.5%, seems to be doing better since CRT upgrade.  I am going to increase losartan to 25 mg bid and decrease milrinone to 0.125 mcg/kg/min.  Will repeat co-ox at followup in 2 wks and will consider stopping milrinone altogether at that time. Add digoxin 0.125 mg daily as we cut back on milrinone. Maintaining NSR, follow LFTs/TSH on amiodarone.   Loralie Champagne 10/05/2013

## 2013-10-14 ENCOUNTER — Telehealth (HOSPITAL_COMMUNITY): Payer: Self-pay | Admitting: Vascular Surgery

## 2013-10-14 MED ORDER — LOSARTAN POTASSIUM 25 MG PO TABS: 25.0000 mg | ORAL_TABLET | Freq: Every day | ORAL | Status: DC

## 2013-10-14 NOTE — Telephone Encounter (Signed)
Returned call concerning low BP and holding medication.  Wife states patient has only been taking losartan once a day instead of twice a day because SBP was in the 90's. Now that losartan has been cut back to once daily, patient feels a little better and SBP low 100s.  Patient and wife would like to keep this as is until seen next week on 10/20/13 to reevaluate medication regimen.  Instructed to call back with any other issues, questions, or concerns.  Aware and agreeable. Renee Pain

## 2013-10-14 NOTE — Telephone Encounter (Signed)
Pt is holding bp meds he is scared his bp is too low. Today 96/50

## 2013-10-14 NOTE — Telephone Encounter (Signed)
Left message for patient to return call to see which meds he has not been taking due to low BP.  Will attempt to call again. Renee Pain

## 2013-10-16 ENCOUNTER — Encounter: Payer: Self-pay | Admitting: Internal Medicine

## 2013-10-20 ENCOUNTER — Encounter: Payer: Self-pay | Admitting: Internal Medicine

## 2013-10-20 ENCOUNTER — Ambulatory Visit (HOSPITAL_COMMUNITY)
Admission: RE | Admit: 2013-10-20 | Discharge: 2013-10-20 | Disposition: A | Discharge status: Home / Self Care | Payer: Medicare HMO | Source: Ambulatory Visit | Attending: Internal Medicine | Admitting: Internal Medicine

## 2013-10-20 VITALS — BP 104/54 | HR 127 | Wt 177.5 lb

## 2013-10-20 DIAGNOSIS — I5022 Chronic systolic (congestive) heart failure: Secondary | ICD-10-CM | POA: Diagnosis not present

## 2013-10-20 DIAGNOSIS — I10 Essential (primary) hypertension: Secondary | ICD-10-CM | POA: Diagnosis not present

## 2013-10-20 DIAGNOSIS — I2510 Atherosclerotic heart disease of native coronary artery without angina pectoris: Secondary | ICD-10-CM | POA: Diagnosis not present

## 2013-10-20 DIAGNOSIS — I509 Heart failure, unspecified: Secondary | ICD-10-CM | POA: Diagnosis not present

## 2013-10-20 DIAGNOSIS — I498 Other specified cardiac arrhythmias: Secondary | ICD-10-CM

## 2013-10-20 DIAGNOSIS — Z833 Family history of diabetes mellitus: Secondary | ICD-10-CM | POA: Insufficient documentation

## 2013-10-20 DIAGNOSIS — I739 Peripheral vascular disease, unspecified: Secondary | ICD-10-CM | POA: Insufficient documentation

## 2013-10-20 DIAGNOSIS — I4891 Unspecified atrial fibrillation: Secondary | ICD-10-CM | POA: Diagnosis not present

## 2013-10-20 DIAGNOSIS — J449 Chronic obstructive pulmonary disease, unspecified: Secondary | ICD-10-CM | POA: Insufficient documentation

## 2013-10-20 DIAGNOSIS — J4489 Other specified chronic obstructive pulmonary disease: Secondary | ICD-10-CM | POA: Insufficient documentation

## 2013-10-20 DIAGNOSIS — Z885 Allergy status to narcotic agent status: Secondary | ICD-10-CM | POA: Diagnosis not present

## 2013-10-20 DIAGNOSIS — I499 Cardiac arrhythmia, unspecified: Secondary | ICD-10-CM | POA: Insufficient documentation

## 2013-10-20 DIAGNOSIS — Z950 Presence of cardiac pacemaker: Secondary | ICD-10-CM

## 2013-10-20 DIAGNOSIS — Z8673 Personal history of transient ischemic attack (TIA), and cerebral infarction without residual deficits: Secondary | ICD-10-CM | POA: Insufficient documentation

## 2013-10-20 DIAGNOSIS — R000 Tachycardia, unspecified: Secondary | ICD-10-CM | POA: Insufficient documentation

## 2013-10-20 DIAGNOSIS — I480 Paroxysmal atrial fibrillation: Secondary | ICD-10-CM

## 2013-10-20 DIAGNOSIS — Z8249 Family history of ischemic heart disease and other diseases of the circulatory system: Secondary | ICD-10-CM | POA: Diagnosis not present

## 2013-10-20 LAB — CARBOXYHEMOGLOBIN
Carboxyhemoglobin: 1.4 % (ref 0.5–1.5)
Methemoglobin: 0.6 % (ref 0.0–1.5)
O2 SAT: 55.2 %
Total hemoglobin: 14 g/dL (ref 13.5–18.0)

## 2013-10-20 LAB — BASIC METABOLIC PANEL
ANION GAP: 12 (ref 5–15)
BUN: 24 mg/dL — AB (ref 6–23)
CO2: 29 mEq/L (ref 19–32)
CREATININE: 1.22 mg/dL (ref 0.50–1.35)
Calcium: 8.8 mg/dL (ref 8.4–10.5)
Chloride: 95 mEq/L — ABNORMAL LOW (ref 96–112)
GFR, EST AFRICAN AMERICAN: 64 mL/min — AB (ref >90)
GFR, EST NON AFRICAN AMERICAN: 55 mL/min — AB (ref >90)
Glucose, Bld: 96 mg/dL (ref 70–99)
Potassium: 3.8 mEq/L (ref 3.7–5.3)
Sodium: 136 mEq/L — ABNORMAL LOW (ref 137–147)

## 2013-10-20 LAB — DIGOXIN LEVEL: Digoxin Level: 1.2 ng/mL (ref 0.8–2.0)

## 2013-10-20 MED ORDER — AMIODARONE HCL 200 MG PO TABS: 200.0000 mg | ORAL_TABLET | Freq: Every day | ORAL | Status: DC

## 2013-10-20 NOTE — Patient Instructions (Signed)
Start Amiodarone 200 mg daily  Labs today  Your physician recommends that you schedule a follow-up appointment in: 1 month

## 2013-10-20 NOTE — Progress Notes (Addendum)
Patient ID: Trevor Simpson, male   DOB: 1933/12/16, 80 y.o.   MRN: [id number]  PCP: Dr Maudie Mercury Pulmonology: Dr Chase Caller Cardiology: Dr Lovena Le  HPI: Mr Trevor Simpson is a 80 year old with history of  COPD, symptomatic bradycardia, PAF, HTN, PAD, CVA, chronic systolic heart failure and ICM s/p CRT-D.  LHC 08/2012  Left mainstem: The left main is long with 20% stenosis distally.  Left anterior descending (LAD): The left anterior descending artery is moderately calcified in the proximal vessel. There is diffuse disease in the proximal vessel up to 30%. The first diagonal is relatively small and has mild disease proximally. The ramus intermediate branch is a large branch that bifurcates in the mid vessel. The proximal vessel has a 90-95% stenosis.  Left circumflex (LCx): The left circumflex is relatively small. It gives rise to a single marginal branch and then continues in the AV groove. The first marginal branch has diffuse 60-70% stenosis proximally.  Right coronary artery (RCA): The right coronary is occluded proximally. There are right to right and left to right collaterals. Severe two-vessel obstructive coronary disease. Chronic total occlusion of the right coronary Successful stenting of the ramus intermediate branch with a bare-metal stent  He was treated for RUL pneumonia 12/14. He developed hemoptysis and his PCP stopped Xarelto and placed him on levaquin.  At that time he was referred to pulmonary. [name] Stopped levaquin and started cipro and clindamycin and coumadin. F/u CXR has shown persistent RUL opacity and there is a question if this might be asymmetric edema.  However, CT showed scarring pattern likely from prior PNA.  Admitted in 6/15 with low-output. RHC as below. Started on milrinone. In July underwent CRT-D upgrade Mean RA 11 PA 53/24  Mean PCWP 22 CI 1.5  Echo (6/15): EF 15%, moderate central MR, mild to moderately decreased RV systolic function.   Follow up for Heart failure:  At last visit co-ox was 66.5%. Milrinone cut to 0.125. Digoxin added. Over past few weeks having multiple episodes of tachypalpitations associated with lightheadedness. Much worse today. Weight down a few pounds. Now 170-175. No orthopnea, PND, edema. When not having palpitations does not feel dizzy.   ECG in clinic initially NSR with v-pacing but then went into atrial tach at 128  Labs:  09/28/12 AST 27 ALT 21  02/10/13 K 3.8 Creatinine  1.41  02/26/13 Pro BNP 1100 07/06/12: K 3.9, creatinine 1.4, BUN 22 6/15: K 4.1, creatinine 1.2 09/2013: K 3.7, creatinine 1.36, HCT 43.7, co-ox 66.5%  SH: Lives at home with wife  FH: Father, Brother - CAD HTN, DM        Sister - lung cancer    ROS: All systems reviewed and negative except as per HPI.   Current Outpatient Prescriptions  Medication Sig Dispense Refill  . ALPRAZolam (XANAX) 0.25 MG tablet Take 0.375 mg by mouth at bedtime.      Marland Kitchen atorvastatin (LIPITOR) 20 MG tablet Take 20 mg by mouth daily at 6 PM.       . B Complex-C (SUPER B COMPLEX) TABS Take 1 tablet by mouth daily.       . calcium carbonate (OS-CAL) 600 MG TABS Take 600 mg by mouth daily with breakfast.       . cetirizine (ZYRTEC) 10 MG tablet Take 10 mg by mouth daily.       . Clotrimazole (JOCK ITCH EX) Apply 1 application topically daily.      . digoxin (LANOXIN) 0.125 MG tablet Take  1 tablet (0.125 mg total) by mouth daily.  30 tablet  3  . furosemide (LASIX) 80 MG tablet Take 40 mg by mouth daily.       Marland Kitchen levothyroxine (SYNTHROID, LEVOTHROID) 112 MCG tablet Take 112 mcg by mouth daily.      Marland Kitchen losartan (COZAAR) 25 MG tablet Take 1 tablet (25 mg total) by mouth daily.  60 tablet  3  . magnesium gluconate (MAGONATE) 500 MG tablet Take 500 mg by mouth daily.       . milrinone (PRIMACOR) 20 MG/100ML SOLN infusion Inject 10.775 mcg/min into the vein continuous.  100 mL  6  . Multiple Vitamin (MULTIVITAMIN WITH MINERALS) TABS Take 1 tablet by mouth daily.      . nitroGLYCERIN  (NITROSTAT) 0.4 MG SL tablet Place 1 tablet (0.4 mg total) under the tongue every 5 (five) minutes as needed for chest pain.  25 tablet  3  . Omega-3 Fatty Acids (FISH OIL) 1200 MG CAPS Take 1,200 mg by mouth 2 (two) times daily.      Marland Kitchen omeprazole (PRILOSEC) 20 MG capsule Take 20 mg by mouth daily as needed (indigestion).       . Saw Palmetto, Serenoa repens, (SAW PALMETTO PO) Take 1 capsule by mouth 2 (two) times daily.      . tamsulosin (FLOMAX) 0.4 MG CAPS capsule Take 0.4 mg by mouth daily after supper.       . vitamin C (ASCORBIC ACID) 500 MG tablet Take 500 mg by mouth daily.        Marland Kitchen warfarin (COUMADIN) 4 MG tablet Take 4 mg by mouth daily.      . [DISCONTINUED] diltiazem (CARDIZEM CD) 120 MG 24 hr capsule        No current facility-administered medications for this encounter.     Allergies  Allergen Reactions  . Codeine     "tripped out on it"  . Morphine     "tripped out on it"    Filed Vitals:   10/20/13 1158  BP: 104/54  Pulse: 127  Weight: 177 lb 8 oz (80.513 kg)  SpO2: 94%    PHYSICAL EXAM: General:  Elderly frail appearing. No respiratory difficulty. Wife and daughter in law present.  HEENT: normal Neck: supple. JVP 7; Carotids 2+ bilat; no bruits. No lymphadenopathy or thryomegaly appreciated. R neck scar Cor: Distant heart sounds. Regular rate & rhythm.  2/6 HSM apex.   Lungs: distant heart sounds. clear Abdomen: soft, nontender, nondistended. No hepatosplenomegaly. No bruits or masses. Good bowel sounds. Extremities: no cyanosis, clubbing, rash, no edema Neuro: alert & oriented x 3, cranial nerves grossly intact. moves all 4 extremities w/o difficulty. Affect pleasant.   ASSESSMENT & PLAN:  1. Chronic Systolic Heart Failure: ICM s/p CRT-D; Echo 6/15 with EF 15%, moderate MR, mild to moderately decreased RV systolic function.  RHC in 6/15 showed CI 1.5 so patient was started on milrinone.  He remains on this medication at 0.25 via PICC line.   - He is  symptomatically improved since CRT upgrade. Tolerating milrinone wean well.  - NYHA II symptoms and volume status stable. He is now on lasix 40 mg daily which has been recently cut back. - Will recheck co-ox  if >= 60% we can stop completely. Not currently on BB with milrinone. If we are able to get milrinone off would like to try and get low dose BB back on.   - Continue digoxin 0.125. Check level today - Continue losartan  25 mg BID. - Reinforced the need and importance of daily weights, a low sodium diet, and fluid restriction (less than 2 L a day). Instructed to call the HF clinic if weight increases more than 3 lbs overnight or 5 lbs in a week.  2. PAF: S/P DC-CV 09/2012, was on amiodarone 200 mg daily but accidentally stopped in 6/15 on hospital d/c. Will restart. No AF on ICD interrogation since DC-CV, Switched fromt xarelto to coumadin due to hemoptysis in setting of PNA. Coumadin managed at [facility].  Patient should get yearly eye exam with amiodarone use.  3. CAD: S/P BMS ramus intermediate branch with a bare-metal stent. No evidence of ischemia. Continue statin and ARB. No ASA while on Coumadin.  4. Tachycardia - ICD interrogated with [employer] rep today and showed PMT. Device reprogrammed with resolution of PMT.   F/U 2 weeks Benay Spice 10/20/2013

## 2013-10-20 NOTE — Addendum Note (Signed)
Encounter addended by: Scarlette Calico, RN on: 10/20/2013  1:14 PM<BR>     Documentation filed: Patient Instructions Section, Orders

## 2013-10-20 NOTE — Addendum Note (Signed)
Encounter addended by: Jolaine Artist, MD on: 10/20/2013 12:41 PM<BR>     Documentation filed: Notes Section

## 2013-10-20 NOTE — Addendum Note (Signed)
Encounter addended by: Scarlette Calico, RN on: 10/20/2013  1:38 PM<BR>     Documentation filed: Orders

## 2013-10-21 ENCOUNTER — Encounter: Payer: Self-pay | Admitting: Internal Medicine

## 2013-10-21 NOTE — Addendum Note (Signed)
Encounter addended by: Asencion Gowda, CCT on: 10/21/2013  6:49 AM<BR>     Documentation filed: Charges VN

## 2013-10-21 NOTE — Addendum Note (Signed)
Encounter addended by: Asencion Gowda, CCT on: 10/21/2013  6:51 AM<BR>     Documentation filed: Charges VN

## 2013-10-28 ENCOUNTER — Telehealth (HOSPITAL_COMMUNITY): Payer: Self-pay | Admitting: Vascular Surgery

## 2013-10-28 NOTE — Telephone Encounter (Signed)
Gave Verbal order to re-cert pt to [facility]

## 2013-10-28 NOTE — Telephone Encounter (Signed)
Nurse [facility] need recert orders to continue seeing pt .Marland Kitchen Please advise

## 2013-11-03 ENCOUNTER — Other Ambulatory Visit: Payer: Self-pay

## 2013-11-03 ENCOUNTER — Telehealth: Payer: Self-pay

## 2013-11-03 MED ORDER — DIGOXIN 125 MCG PO TABS: 0.1250 mg | ORAL_TABLET | Freq: Every day | ORAL | Status: DC

## 2013-11-03 NOTE — Telephone Encounter (Signed)
Believe pt should be on losartan 25 mg daily but have left mess for pt to call back to clarify

## 2013-11-04 MED ORDER — LOSARTAN POTASSIUM 25 MG PO TABS: 25.0000 mg | ORAL_TABLET | Freq: Every day | ORAL | Status: DC

## 2013-11-04 NOTE — Telephone Encounter (Signed)
Spoke w/pt he is on losartan 25 mg daily, which is correct dose, it was decreased from bid on 7/29 due to hypotension, refill sent in

## 2013-11-11 ENCOUNTER — Encounter: Payer: Self-pay | Admitting: Internal Medicine

## 2013-11-13 ENCOUNTER — Telehealth: Payer: Self-pay | Admitting: Cardiology

## 2013-11-13 ENCOUNTER — Encounter: Payer: Self-pay | Admitting: Internal Medicine

## 2013-11-13 NOTE — Telephone Encounter (Signed)
Pt called feeling bad, low B/P - 90 systolic. I suggested he stop his Cozaar for now. He has a follow up next week.  Kerin Ransom PA-C 11/13/2013 5:23 PM

## 2013-11-16 ENCOUNTER — Encounter: Payer: Self-pay | Admitting: Internal Medicine

## 2013-11-17 ENCOUNTER — Ambulatory Visit (HOSPITAL_COMMUNITY)
Admission: RE | Admit: 2013-11-17 | Discharge: 2013-11-17 | Disposition: A | Discharge status: Home / Self Care | Payer: Medicare HMO | Source: Ambulatory Visit | Attending: Cardiology | Admitting: Cardiology

## 2013-11-17 VITALS — BP 114/66 | HR 61 | Wt 176.2 lb

## 2013-11-17 DIAGNOSIS — J449 Chronic obstructive pulmonary disease, unspecified: Secondary | ICD-10-CM | POA: Insufficient documentation

## 2013-11-17 DIAGNOSIS — I10 Essential (primary) hypertension: Secondary | ICD-10-CM | POA: Diagnosis not present

## 2013-11-17 DIAGNOSIS — Z9861 Coronary angioplasty status: Secondary | ICD-10-CM | POA: Insufficient documentation

## 2013-11-17 DIAGNOSIS — I4891 Unspecified atrial fibrillation: Secondary | ICD-10-CM

## 2013-11-17 DIAGNOSIS — Z7901 Long term (current) use of anticoagulants: Secondary | ICD-10-CM | POA: Insufficient documentation

## 2013-11-17 DIAGNOSIS — I2589 Other forms of chronic ischemic heart disease: Secondary | ICD-10-CM | POA: Insufficient documentation

## 2013-11-17 DIAGNOSIS — I5022 Chronic systolic (congestive) heart failure: Secondary | ICD-10-CM | POA: Diagnosis present

## 2013-11-17 DIAGNOSIS — I509 Heart failure, unspecified: Secondary | ICD-10-CM

## 2013-11-17 DIAGNOSIS — Z8673 Personal history of transient ischemic attack (TIA), and cerebral infarction without residual deficits: Secondary | ICD-10-CM | POA: Insufficient documentation

## 2013-11-17 DIAGNOSIS — Z8249 Family history of ischemic heart disease and other diseases of the circulatory system: Secondary | ICD-10-CM | POA: Insufficient documentation

## 2013-11-17 DIAGNOSIS — I2510 Atherosclerotic heart disease of native coronary artery without angina pectoris: Secondary | ICD-10-CM | POA: Diagnosis not present

## 2013-11-17 DIAGNOSIS — J4489 Other specified chronic obstructive pulmonary disease: Secondary | ICD-10-CM | POA: Insufficient documentation

## 2013-11-17 DIAGNOSIS — I739 Peripheral vascular disease, unspecified: Secondary | ICD-10-CM | POA: Insufficient documentation

## 2013-11-17 LAB — CARBOXYHEMOGLOBIN
Carboxyhemoglobin: 1.2 % (ref 0.5–1.5)
METHEMOGLOBIN: 0.5 % (ref 0.0–1.5)
O2 Saturation: 56.5 %
TOTAL HEMOGLOBIN: 13.8 g/dL (ref 13.5–18.0)

## 2013-11-17 MED ORDER — LOSARTAN POTASSIUM 25 MG PO TABS: 12.5000 mg | ORAL_TABLET | Freq: Every evening | ORAL | Status: DC

## 2013-11-17 NOTE — Progress Notes (Signed)
Patient ID: Trevor Simpson, male   DOB: 10-May-1933, 80 y.o.   MRN: [id number] PCP: Dr Maudie Mercury Pulmonology: Dr Chase Caller Cardiology: Dr Lovena Le  HPI: Trevor Simpson is a 80 year old with history of  COPD, symptomatic bradycardia, PAF, HTN, PAD, CVA, chronic systolic heart failure and ischemic cardiomyopathy s/p CRT-D.  LHC 08/2012  Left mainstem: The left main is long with 20% stenosis distally.  Left anterior descending (LAD): The left anterior descending artery is moderately calcified in the proximal vessel. There is diffuse disease in the proximal vessel up to 30%. The first diagonal is relatively small and has mild disease proximally. The ramus intermediate branch is a large branch that bifurcates in the mid vessel. The proximal vessel has a 90-95% stenosis.  Left circumflex (LCx): The left circumflex is relatively small. It gives rise to a single marginal branch and then continues in the AV groove. The first marginal branch has diffuse 60-70% stenosis proximally.  Right coronary artery (RCA): The right coronary is occluded proximally. There are right to right and left to right collaterals. Severe two-vessel obstructive coronary disease. Chronic total occlusion of the right coronary Successful stenting of the ramus intermediate branch with a bare-metal stent  He was treated for RUL pneumonia 12/14. He developed hemoptysis and his PCP stopped Xarelto and placed him on levaquin.  At that time he was referred to pulmonary. [name] Stopped levaquin and started cipro and clindamycin and coumadin. F/u CXR has shown persistent RUL opacity and there is a question if this might be asymmetric edema.  However, CT showed scarring pattern likely from prior PNA.  Admitted in 6/15 with low-output. RHC as below. Started on milrinone. In July underwent CRT-D upgrade Mean RA 11 PA 53/24  Mean PCWP 22 CI 1.5  Echo (6/15): EF 15%, moderate central Trevor, mild to moderately decreased RV systolic function.   His  milrinone was titrated down to 0.125 mcg/kg/min, but co-ox was 55% so it was not stopped.  Co-ox today was 56%.  Patient denies exertional dyspnea but he fatigues easily.  He is able to walk around [place] but tires.  He goes to the [facility] and uses the treadmill and exercise bike.  Weight is down 1 lb.  About 3 weeks ago, he stopped amiodarone because he felt "bad" (not really able to describe farther).  He has felt better off amiodarone and does not want to restart it.  He also stopped losartan on Friday due to low blood pressure with orthostatic symptoms.  He is not longer getting lightheaded with standing. He is not in atrial fibrillation today.   ECG: A-V sequential pacing.   Labs:  09/28/12 AST 27 ALT 21  02/10/13 K 3.8 Creatinine  1.41  02/26/13 Pro BNP 1100 07/06/12: K 3.9, creatinine 1.4, BUN 22 6/15: K 4.1, creatinine 1.2 09/2013: K 3.7, creatinine 1.36, HCT 43.7, co-ox 66.5% 8/15: digoxin 1.2, co-ox 55%, K 4.0, creatinine 1.36 9/15: co-ox 56%  SH: Lives at home with wife  FH: Father, Brother - CAD HTN, DM        Sister - lung cancer    ROS: All systems reviewed and negative except as per HPI.   Current Outpatient Prescriptions  Medication Sig Dispense Refill  . ALPRAZolam (XANAX) 0.25 MG tablet Take 0.375 mg by mouth at bedtime.      Marland Kitchen atorvastatin (LIPITOR) 20 MG tablet Take 20 mg by mouth daily at 6 PM.       . B Complex-C (SUPER B COMPLEX)  TABS Take 1 tablet by mouth daily.       . calcium carbonate (OS-CAL) 600 MG TABS Take 600 mg by mouth daily with breakfast.       . cetirizine (ZYRTEC) 10 MG tablet Take 10 mg by mouth daily.       . Clotrimazole (JOCK ITCH EX) Apply 1 application topically daily.      . digoxin (LANOXIN) 0.125 MG tablet Take 1 tablet (0.125 mg total) by mouth daily.  90 tablet  1  . furosemide (LASIX) 80 MG tablet Take 40 mg by mouth daily.       Marland Kitchen levothyroxine (SYNTHROID, LEVOTHROID) 112 MCG tablet Take 112 mcg by mouth daily.      . magnesium gluconate  (MAGONATE) 500 MG tablet Take 500 mg by mouth daily.       . milrinone (PRIMACOR) 20 MG/100ML SOLN infusion Inject 10.775 mcg/min into the vein continuous.  100 mL  6  . Multiple Vitamin (MULTIVITAMIN WITH MINERALS) TABS Take 1 tablet by mouth daily.      . nitroGLYCERIN (NITROSTAT) 0.4 MG SL tablet Place 1 tablet (0.4 mg total) under the tongue every 5 (five) minutes as needed for chest pain.  25 tablet  3  . Omega-3 Fatty Acids (FISH OIL) 1200 MG CAPS Take 1,200 mg by mouth 2 (two) times daily.      Marland Kitchen omeprazole (PRILOSEC) 20 MG capsule Take 20 mg by mouth daily as needed (indigestion).       . Saw Palmetto, Serenoa repens, (SAW PALMETTO PO) Take 1 capsule by mouth 2 (two) times daily.      . tamsulosin (FLOMAX) 0.4 MG CAPS capsule Take 0.4 mg by mouth daily after supper.       . vitamin C (ASCORBIC ACID) 500 MG tablet Take 500 mg by mouth daily.        Marland Kitchen warfarin (COUMADIN) 4 MG tablet Take 4 mg by mouth daily.      Marland Kitchen losartan (COZAAR) 25 MG tablet Take 0.5 tablets (12.5 mg total) by mouth every evening.  15 tablet  2  . [DISCONTINUED] diltiazem (CARDIZEM CD) 120 MG 24 hr capsule        No current facility-administered medications for this encounter.     Allergies  Allergen Reactions  . Codeine     "tripped out on it"  . Morphine     "tripped out on it"    Filed Vitals:   11/17/13 1132  BP: 114/66  Pulse: 61  Weight: 176 lb 4 oz (79.946 kg)  SpO2: 99%    PHYSICAL EXAM: General:  Elderly frail appearing. No respiratory difficulty. Wife and daughter in law present.  HEENT: normal Neck: supple. JVP 7; Carotids 2+ bilat; no bruits. No lymphadenopathy or thryomegaly appreciated. R neck scar Cor: Distant heart sounds. Regular rate & rhythm.  1/6 HSM apex.   Lungs: distant heart sounds. clear Abdomen: soft, nontender, nondistended. No hepatosplenomegaly. No bruits or masses. Good bowel sounds. Extremities: no cyanosis, clubbing, rash, no edema Neuro: alert & oriented x 3, cranial  nerves grossly intact. moves all 4 extremities w/o difficulty. Affect pleasant.   ASSESSMENT & PLAN: 1. Chronic Systolic Heart Failure: Ischemic cardiomyopathy s/p CRT-D; Echo 6/15 with EF 15%, moderate Trevor, mild to moderately decreased RV systolic function.  RHC in 6/15 showed CI 1.5 so patient was started on milrinone.  Milrinone was titrated down to 0.125 mcg/kg/min. NYHA class II symptoms.    - Co-ox 56% today.  Will continue  milrinone at 0.125 mcg/kg/min.  - Continue current Lasix dose and digoxin.  - Restart losartan at 12.5 mg qhs (lower dose).   - BMET 1 week.  - Reinforced the need and importance of daily weights, a low sodium diet, and fluid restriction (less than 2 L a day). Instructed to call the HF clinic if weight increases more than 3 lbs overnight or 5 lbs in a week.  2. PAF: S/P DC-CV 09/2012.  He will continue warfarin.  He was on amiodarone but says that it made him feel bad so does not want to restart. If he goes back into atrial fibrillation, he is willing to try it again.  3. CAD: Extensive.  Continue statin.  No aspirin given stable CAD and use of warfarin.  F/U 2 weeks  Cage Gupton,MD 11/17/2013

## 2013-11-17 NOTE — Patient Instructions (Signed)
RESTART Losartan at 12.5 mg every evening  Continue to have weekly labs  Your physician recommends that you schedule a follow-up appointment in: 2 weeks   Do the following things EVERYDAY: 1) Weigh yourself in the morning before breakfast. Write it down and keep it in a log. 2) Take your medicines as prescribed 3) Eat low salt foods-Limit salt (sodium) to 2000 mg per day.  4) Stay as active as you can everyday 5) Limit all fluids for the day to less than 2 liters 6)

## 2013-11-18 ENCOUNTER — Telehealth (HOSPITAL_COMMUNITY): Payer: Self-pay | Admitting: Vascular Surgery

## 2013-11-18 NOTE — Telephone Encounter (Signed)
Nurse called pt told Trevor Simpson that Milrinone was being D/c... She went out today and changed bag.Marland Kitchen Please advise

## 2013-11-18 NOTE — Telephone Encounter (Signed)
Per Dr Aundra Dubin milrinone will be continued, pt and Janett Billow both aware

## 2013-11-18 NOTE — Addendum Note (Signed)
Encounter addended by: Evalee Mutton, CCT on: 11/18/2013  8:29 AM<BR>     Documentation filed: Charges VN

## 2013-11-20 ENCOUNTER — Encounter: Payer: Self-pay | Admitting: Internal Medicine

## 2013-11-24 ENCOUNTER — Encounter: Payer: Self-pay | Admitting: Internal Medicine

## 2013-11-25 ENCOUNTER — Encounter: Payer: Self-pay | Admitting: Internal Medicine

## 2013-12-01 ENCOUNTER — Ambulatory Visit (HOSPITAL_COMMUNITY)
Admission: RE | Admit: 2013-12-01 | Discharge: 2013-12-01 | Disposition: A | Discharge status: Home / Self Care | Payer: Medicare HMO | Source: Ambulatory Visit | Attending: Cardiology | Admitting: Cardiology

## 2013-12-01 VITALS — BP 108/62 | HR 60 | Wt 174.2 lb

## 2013-12-01 DIAGNOSIS — I2589 Other forms of chronic ischemic heart disease: Secondary | ICD-10-CM | POA: Insufficient documentation

## 2013-12-01 DIAGNOSIS — Z8249 Family history of ischemic heart disease and other diseases of the circulatory system: Secondary | ICD-10-CM | POA: Insufficient documentation

## 2013-12-01 DIAGNOSIS — I10 Essential (primary) hypertension: Secondary | ICD-10-CM | POA: Diagnosis not present

## 2013-12-01 DIAGNOSIS — I059 Rheumatic mitral valve disease, unspecified: Secondary | ICD-10-CM | POA: Insufficient documentation

## 2013-12-01 DIAGNOSIS — I2510 Atherosclerotic heart disease of native coronary artery without angina pectoris: Secondary | ICD-10-CM | POA: Diagnosis not present

## 2013-12-01 DIAGNOSIS — J4489 Other specified chronic obstructive pulmonary disease: Secondary | ICD-10-CM | POA: Insufficient documentation

## 2013-12-01 DIAGNOSIS — J449 Chronic obstructive pulmonary disease, unspecified: Secondary | ICD-10-CM | POA: Insufficient documentation

## 2013-12-01 DIAGNOSIS — Z9889 Other specified postprocedural states: Secondary | ICD-10-CM | POA: Diagnosis not present

## 2013-12-01 DIAGNOSIS — I4891 Unspecified atrial fibrillation: Secondary | ICD-10-CM | POA: Insufficient documentation

## 2013-12-01 DIAGNOSIS — Z7901 Long term (current) use of anticoagulants: Secondary | ICD-10-CM | POA: Diagnosis not present

## 2013-12-01 DIAGNOSIS — I739 Peripheral vascular disease, unspecified: Secondary | ICD-10-CM | POA: Diagnosis not present

## 2013-12-01 DIAGNOSIS — I5022 Chronic systolic (congestive) heart failure: Secondary | ICD-10-CM

## 2013-12-01 DIAGNOSIS — I480 Paroxysmal atrial fibrillation: Secondary | ICD-10-CM

## 2013-12-01 DIAGNOSIS — I498 Other specified cardiac arrhythmias: Secondary | ICD-10-CM | POA: Diagnosis not present

## 2013-12-01 DIAGNOSIS — Z79899 Other long term (current) drug therapy: Secondary | ICD-10-CM | POA: Insufficient documentation

## 2013-12-01 DIAGNOSIS — Z885 Allergy status to narcotic agent status: Secondary | ICD-10-CM | POA: Diagnosis not present

## 2013-12-01 DIAGNOSIS — I509 Heart failure, unspecified: Secondary | ICD-10-CM | POA: Insufficient documentation

## 2013-12-01 DIAGNOSIS — Z8673 Personal history of transient ischemic attack (TIA), and cerebral infarction without residual deficits: Secondary | ICD-10-CM | POA: Diagnosis not present

## 2013-12-01 NOTE — Addendum Note (Signed)
Encounter addended by: Scarlette Calico, RN on: 12/01/2013 11:35 AM<BR>     Documentation filed: Patient Instructions Section

## 2013-12-01 NOTE — Patient Instructions (Signed)
Your physician recommends that you schedule a follow-up appointment in: 1 week

## 2013-12-01 NOTE — Progress Notes (Signed)
Patient ID: Trevor Simpson, male   DOB: 07-Jan-1934, 80 y.o.   MRN: [id number] PCP: Dr Maudie Mercury Pulmonology: Dr Chase Caller Cardiology: Dr Lovena Le  HPI: Trevor Simpson is a 80 year old with history of  COPD, symptomatic bradycardia, PAF, HTN, PAD, CVA, chronic systolic heart failure and ischemic cardiomyopathy s/p CRT-D on 09/26/13  LHC 08/2012  Left mainstem: The left main is long with 20% stenosis distally.  Left anterior descending (LAD): The left anterior descending artery is moderately calcified in the proximal vessel. There is diffuse disease in the proximal vessel up to 30%. The first diagonal is relatively small and has mild disease proximally. The ramus intermediate branch is a large branch that bifurcates in the mid vessel. The proximal vessel has a 90-95% stenosis.  Left circumflex (LCx): The left circumflex is relatively small. It gives rise to a single marginal branch and then continues in the AV groove. The first marginal branch has diffuse 60-70% stenosis proximally.  Right coronary artery (RCA): The right coronary is occluded proximally. There are right to right and left to right collaterals. Severe two-vessel obstructive coronary disease. Chronic total occlusion of the right coronary Successful stenting of the ramus intermediate branch with a bare-metal stent  He was treated for RUL pneumonia 12/14. He developed hemoptysis and his PCP stopped Xarelto and placed him on levaquin.  At that time he was referred to pulmonary. Dr Trevor Simpson Stopped levaquin and started cipro and clindamycin and coumadin. F/u CXR has shown persistent RUL opacity and there is a question if this might be asymmetric edema.  However, CT showed scarring pattern likely from prior PNA.  Admitted in 6/15 with low-output. RHC as below. Started on milrinone. In July 2015 underwent CRT-D upgrade Mean RA 11 PA 53/24  Mean PCWP 22 CI 1.5  Echo (6/15): EF 15%, moderate central Trevor, mild to moderately decreased RV systolic function.    Follow-up: Recently his milrinone was titrated down to 0.125 mcg/kg/min, but co-ox was 55% so it was not stopped.  Co-ox 2 weeks ago (11/17/13) co-ox 56%.  At last he stopped amiodarone and the losartan on his own. Now back on amio 100 daily which he tolerates. Cannot tolerate losartan due to low BP in 80s-90s. Says he feels really good overall but last few days weren't as good. No swelling, orthopnea or PND. Maintaining NSR.   Labs:  09/28/12 AST 27 ALT 21  02/10/13 K 3.8 Creatinine  1.41  02/26/13 Pro BNP 1100 07/06/12: K 3.9, creatinine 1.4, BUN 22 6/15: K 4.1, creatinine 1.2 09/2013: K 3.7, creatinine 1.36, HCT 43.7, co-ox 66.5% 8/15: digoxin 1.2, co-ox 55%, K 4.0, creatinine 1.36 9/15: co-ox 56%  SH: Lives at home with wife  FH: Father, Brother - CAD HTN, DM        Sister - lung cancer    ROS: All systems reviewed and negative except as per HPI.   Current Outpatient Prescriptions  Medication Sig Dispense Refill  . ALPRAZolam (XANAX) 0.25 MG tablet Take 0.375 mg by mouth at bedtime.      Marland Kitchen amiodarone (PACERONE) 200 MG tablet Take 100 mg by mouth daily.      Marland Kitchen atorvastatin (LIPITOR) 20 MG tablet Take 20 mg by mouth daily at 6 PM.       . B Complex-C (SUPER B COMPLEX) TABS Take 1 tablet by mouth daily.       . calcium carbonate (OS-CAL) 600 MG TABS Take 600 mg by mouth daily with breakfast.       .  cetirizine (ZYRTEC) 10 MG tablet Take 10 mg by mouth daily.       . Clotrimazole (JOCK ITCH EX) Apply 1 application topically daily.      . digoxin (LANOXIN) 0.125 MG tablet Take 1 tablet (0.125 mg total) by mouth daily.  90 tablet  1  . furosemide (LASIX) 80 MG tablet Take 40 mg by mouth daily.       Marland Kitchen levothyroxine (SYNTHROID, LEVOTHROID) 112 MCG tablet Take 112 mcg by mouth daily.      . magnesium gluconate (MAGONATE) 500 MG tablet Take 500 mg by mouth daily.       . milrinone (PRIMACOR) 20 MG/100ML SOLN infusion Inject 10.775 mcg/min into the vein continuous.  100 mL  6  . Multiple  Vitamin (MULTIVITAMIN WITH MINERALS) TABS Take 1 tablet by mouth daily.      . nitroGLYCERIN (NITROSTAT) 0.4 MG SL tablet Place 1 tablet (0.4 mg total) under the tongue every 5 (five) minutes as needed for chest pain.  25 tablet  3  . Omega-3 Fatty Acids (FISH OIL) 1200 MG CAPS Take 1,200 mg by mouth 2 (two) times daily.      Marland Kitchen omeprazole (PRILOSEC) 20 MG capsule Take 20 mg by mouth daily as needed (indigestion).       . Saw Palmetto, Serenoa repens, (SAW PALMETTO PO) Take 1 capsule by mouth 2 (two) times daily.      . tamsulosin (FLOMAX) 0.4 MG CAPS capsule Take 0.4 mg by mouth daily after supper.       . vitamin C (ASCORBIC ACID) 500 MG tablet Take 500 mg by mouth daily.        Marland Kitchen warfarin (COUMADIN) 4 MG tablet Take 4 mg by mouth daily.      . [DISCONTINUED] diltiazem (CARDIZEM CD) 120 MG 24 hr capsule        No current facility-administered medications for this encounter.     Allergies  Allergen Reactions  . Codeine     "tripped out on it"  . Morphine     "tripped out on it"    Filed Vitals:   12/01/13 1043  BP: 108/62  Pulse: 60  Weight: 174 lb 4 oz (79.039 kg)  SpO2: 97%    PHYSICAL EXAM: General:  Elderly frail appearing. No respiratory difficulty. Wife and daughter in law present.  HEENT: normal Neck: supple. JVP 6; Carotids 2+ bilat; no bruits. No lymphadenopathy or thryomegaly appreciated. R neck scar Cor: Distant heart sounds. Regular rate & rhythm.  2/6 HSM apex.  2/6 TR Lungs: distant heart sounds. clear Abdomen: soft, nontender, nondistended. No hepatosplenomegaly. No bruits or masses. Good bowel sounds. Extremities: no cyanosis, clubbing, rash, no edema Neuro: alert & oriented x 3, cranial nerves grossly intact. moves all 4 extremities w/o difficulty. Affect pleasant.   ASSESSMENT & PLAN: 1. Chronic Systolic Heart Failure: Ischemic cardiomyopathy s/p CRT-D; Echo 6/15 with EF 15%, moderate Trevor, mild to moderately decreased RV systolic function.  RHC in 6/15  showed CI 1.5 so patient was started on milrinone.  Milrinone was titrated down to 0.125 mcg/kg/min. NYHA class II-III symptoms.    - Volume status looks good - Co-ox remains borderline despite CRT-D and milrinone. He however would like to try milrinone wean. Will stop milrinone. Leave PICC in. See back in 1 week t reassess symptoms and co-ox. Can restart milrinone as needed. If decides to stay on milrinone he would want to replace PICC with RIJ catheter.  - Continue current Lasix dose  and digoxin.  - Has been unable to tolerate efforts to get losartan back on baord - Reinforced the need and importance of daily weights, a low sodium diet, and fluid restriction (less than 2 L a day). Instructed to call the HF clinic if weight increases more than 3 lbs overnight or 5 lbs in a week.  2. PAF: S/P DC-CV 09/2012.  He will continue warfarin and low-dose amiodarone  3. CAD: Extensive.  Continue statin.  No aspirin given stable CAD and use of warfarin.  F/U 2 weeks  Benay Spice 12/01/2013

## 2013-12-10 ENCOUNTER — Encounter: Payer: Self-pay | Admitting: Internal Medicine

## 2013-12-10 ENCOUNTER — Ambulatory Visit (HOSPITAL_COMMUNITY)
Admission: RE | Admit: 2013-12-10 | Discharge: 2013-12-10 | Disposition: A | Discharge status: Home / Self Care | Payer: Medicare HMO | Source: Ambulatory Visit | Attending: Internal Medicine | Admitting: Internal Medicine

## 2013-12-10 VITALS — BP 124/60 | HR 75 | Wt 171.0 lb

## 2013-12-10 DIAGNOSIS — I739 Peripheral vascular disease, unspecified: Secondary | ICD-10-CM | POA: Diagnosis not present

## 2013-12-10 DIAGNOSIS — I4891 Unspecified atrial fibrillation: Secondary | ICD-10-CM | POA: Diagnosis not present

## 2013-12-10 DIAGNOSIS — I480 Paroxysmal atrial fibrillation: Secondary | ICD-10-CM

## 2013-12-10 DIAGNOSIS — Z8249 Family history of ischemic heart disease and other diseases of the circulatory system: Secondary | ICD-10-CM | POA: Diagnosis not present

## 2013-12-10 DIAGNOSIS — J449 Chronic obstructive pulmonary disease, unspecified: Secondary | ICD-10-CM | POA: Insufficient documentation

## 2013-12-10 DIAGNOSIS — I498 Other specified cardiac arrhythmias: Secondary | ICD-10-CM | POA: Insufficient documentation

## 2013-12-10 DIAGNOSIS — I5022 Chronic systolic (congestive) heart failure: Secondary | ICD-10-CM | POA: Diagnosis present

## 2013-12-10 DIAGNOSIS — Z8673 Personal history of transient ischemic attack (TIA), and cerebral infarction without residual deficits: Secondary | ICD-10-CM | POA: Insufficient documentation

## 2013-12-10 DIAGNOSIS — I2510 Atherosclerotic heart disease of native coronary artery without angina pectoris: Secondary | ICD-10-CM | POA: Diagnosis not present

## 2013-12-10 DIAGNOSIS — I2589 Other forms of chronic ischemic heart disease: Secondary | ICD-10-CM | POA: Diagnosis not present

## 2013-12-10 DIAGNOSIS — J4489 Other specified chronic obstructive pulmonary disease: Secondary | ICD-10-CM | POA: Insufficient documentation

## 2013-12-10 DIAGNOSIS — I10 Essential (primary) hypertension: Secondary | ICD-10-CM | POA: Diagnosis not present

## 2013-12-10 DIAGNOSIS — I509 Heart failure, unspecified: Secondary | ICD-10-CM

## 2013-12-10 DIAGNOSIS — Z7901 Long term (current) use of anticoagulants: Secondary | ICD-10-CM | POA: Diagnosis not present

## 2013-12-10 LAB — BASIC METABOLIC PANEL
Anion gap: 9 (ref 5–15)
BUN: 22 mg/dL (ref 6–23)
CALCIUM: 9.2 mg/dL (ref 8.4–10.5)
CO2: 32 meq/L (ref 19–32)
Chloride: 98 mEq/L (ref 96–112)
Creatinine, Ser: 1.34 mg/dL (ref 0.50–1.35)
GFR calc Af Amer: 57 mL/min — ABNORMAL LOW (ref >90)
GFR calc non Af Amer: 49 mL/min — ABNORMAL LOW (ref >90)
GLUCOSE: 111 mg/dL — AB (ref 70–99)
Potassium: 3.7 mEq/L (ref 3.7–5.3)
Sodium: 139 mEq/L (ref 137–147)

## 2013-12-10 LAB — CARBOXYHEMOGLOBIN
Carboxyhemoglobin: 1.4 % (ref 0.5–1.5)
Methemoglobin: 1 % (ref 0.0–1.5)
O2 Saturation: 56.8 %
Total hemoglobin: 14.7 g/dL (ref 13.5–18.0)

## 2013-12-10 NOTE — Addendum Note (Signed)
Encounter addended by: Kerry Dory, CMA on: 12/10/2013 11:08 AM<BR>     Documentation filed: Orders

## 2013-12-10 NOTE — Addendum Note (Signed)
Encounter addended by: Kerry Dory, CMA on: 12/10/2013 11:01 AM<BR>     Documentation filed: Patient Instructions Section

## 2013-12-10 NOTE — Patient Instructions (Signed)
Your physician recommends that you schedule a follow-up appointment in: 1 month  Do the following things EVERYDAY: 1) Weigh yourself in the morning before breakfast. Write it down and keep it in a log. 2) Take your medicines as prescribed 3) Eat low salt foods-Limit salt (sodium) to 2000 mg per day.  4) Stay as active as you can everyday 5) Limit all fluids for the day to less than 2 liters 6)

## 2013-12-10 NOTE — Progress Notes (Signed)
Patient ID: Trevor Simpson, male   DOB: 11-Jan-1934, 80 y.o.   MRN: [id number] PCP: Dr Maudie Mercury Pulmonology: Dr Chase Caller Cardiology: Dr Lovena Le  HPI: Trevor Simpson is a 80 year old with history of  COPD, symptomatic bradycardia, PAF, HTN, PAD, CVA, chronic systolic heart failure and ischemic cardiomyopathy s/p CRT-D on 09/26/13  LHC 08/2012  Left mainstem: The left main is long with 20% stenosis distally.  Left anterior descending (LAD): The left anterior descending artery is moderately calcified in the proximal vessel. There is diffuse disease in the proximal vessel up to 30%. The first diagonal is relatively small and has mild disease proximally. The ramus intermediate branch is a large branch that bifurcates in the mid vessel. The proximal vessel has a 90-95% stenosis.  Left circumflex (LCx): The left circumflex is relatively small. It gives rise to a single marginal branch and then continues in the AV groove. The first marginal branch has diffuse 60-70% stenosis proximally.  Right coronary artery (RCA): The right coronary is occluded proximally. There are right to right and left to right collaterals. Severe two-vessel obstructive coronary disease. Chronic total occlusion of the right coronary Successful stenting of the ramus intermediate branch with a bare-metal stent  He was treated for RUL pneumonia 12/14. He developed hemoptysis and his PCP stopped Xarelto and placed him on levaquin.  At that time he was referred to pulmonary. Dr Trevor Simpson Stopped levaquin and started cipro and clindamycin and coumadin. F/u CXR has shown persistent RUL opacity and there is a question if this might be asymmetric edema.  However, CT showed scarring pattern likely from prior PNA.  Admitted in 6/15 with low-output. RHC as below. Started on milrinone. In July 2015 underwent CRT-D upgrade Mean RA 11 PA 53/24  Mean PCWP 22 CI 1.5  Echo (6/15): EF 15%, moderate central Trevor, mild to moderately decreased RV systolic function.    Follow-up: He presents today for scheduled f/up. Milrinone stopped at last visit 2 weeks ago per pt preference, despite borderline co-ox. Has been doing well off the milrinone. Monday had a day when he spent quite a bit of time outdoors. Says this is the best he has felt in 1.5 years. Losartan 12.5 daily stopped about 3-4 weeks ago due to SBP in 80s. Feels better without it Weights 165-167. Systolic BP 510-258. No swelling, orthopnea or PND. Can walk around all of [employer] without getting short of breath but has difficulty with inclines. Sometimes will feel an irregular heartbeat when exerts himself. But otherwise maintaining NSR.   Labs:  09/28/12 AST 27 ALT 21  02/10/13 K 3.8 Creatinine  1.41  02/26/13 Pro BNP 1100 07/06/12: K 3.9, creatinine 1.4, BUN 22 6/15: K 4.1, creatinine 1.2 09/2013: K 3.7, creatinine 1.36, HCT 43.7, co-ox 66.5% 8/15: digoxin 1.2, co-ox 55%, K 4.0, creatinine 1.36 9/15: co-ox 56% 9/24: co-ox 56.8%  SH: Lives at home with wife  FH: Father, Brother - CAD HTN, DM        Sister - lung cancer    ROS: All systems reviewed and negative except as per HPI.   Current Outpatient Prescriptions  Medication Sig Dispense Refill  . ALPRAZolam (XANAX) 0.25 MG tablet Take 0.375 mg by mouth at bedtime.      Marland Kitchen amiodarone (PACERONE) 200 MG tablet Take 100 mg by mouth daily.      Marland Kitchen atorvastatin (LIPITOR) 20 MG tablet Take 20 mg by mouth daily at 6 PM.       . B Complex-C (SUPER  B COMPLEX) TABS Take 1 tablet by mouth daily.       . calcium carbonate (OS-CAL) 600 MG TABS Take 600 mg by mouth daily with breakfast.       . cetirizine (ZYRTEC) 10 MG tablet Take 10 mg by mouth daily.       . Clotrimazole (JOCK ITCH EX) Apply 1 application topically daily.      . digoxin (LANOXIN) 0.125 MG tablet Take 1 tablet (0.125 mg total) by mouth daily.  90 tablet  1  . furosemide (LASIX) 80 MG tablet Take 40 mg by mouth daily.       Marland Kitchen levothyroxine (SYNTHROID, LEVOTHROID) 112 MCG tablet Take 112  mcg by mouth daily.      . magnesium gluconate (MAGONATE) 500 MG tablet Take 500 mg by mouth daily.       . Multiple Vitamin (MULTIVITAMIN WITH MINERALS) TABS Take 1 tablet by mouth daily.      . nitroGLYCERIN (NITROSTAT) 0.4 MG SL tablet Place 1 tablet (0.4 mg total) under the tongue every 5 (five) minutes as needed for chest pain.  25 tablet  3  . Omega-3 Fatty Acids (FISH OIL) 1200 MG CAPS Take 1,200 mg by mouth 2 (two) times daily.      Marland Kitchen omeprazole (PRILOSEC) 20 MG capsule Take 20 mg by mouth daily as needed (indigestion).       . Saw Palmetto, Serenoa repens, (SAW PALMETTO PO) Take 1 capsule by mouth 2 (two) times daily.      . tamsulosin (FLOMAX) 0.4 MG CAPS capsule Take 0.4 mg by mouth daily after supper.       . vitamin C (ASCORBIC ACID) 500 MG tablet Take 500 mg by mouth daily.        Marland Kitchen warfarin (COUMADIN) 4 MG tablet Take 4 mg by mouth daily.      . [DISCONTINUED] diltiazem (CARDIZEM CD) 120 MG 24 hr capsule        No current facility-administered medications for this encounter.     Allergies  Allergen Reactions  . Codeine     "tripped out on it"  . Morphine     "tripped out on it"    Filed Vitals:   12/10/13 0954  BP: 124/60  Pulse: 75  Weight: 171 lb (77.565 kg)  SpO2: 99%    PHYSICAL EXAM: General:  NAD. No respiratory difficulty. Family present. HEENT: normal Neck: supple. JVP 6-7; Carotids 2+ bilat; no bruits. No lymphadenopathy or thryomegaly appreciated. R neck scar Cor: Distant heart sounds. Regular rate & rhythm.  2/6 HSM apex.  2/6 TR Lungs: distant heart sounds. clear Abdomen: soft, nontender, nondistended. No hepatosplenomegaly. No bruits or masses. Good bowel sounds. Extremities: no cyanosis, clubbing, rash, no edema Neuro: alert & oriented x 3, cranial nerves grossly intact. moves all 4 extremities w/o difficulty. Affect pleasant.   ASSESSMENT & PLAN: 1. Chronic Systolic Heart Failure: Ischemic cardiomyopathy s/p CRT-D; Echo 6/15 with EF 15%,  moderate Trevor, mild to moderately decreased RV systolic function.  RHC in 6/15 showed CI 1.5 so patient was started on milrinone.  Milrinone was titrated down to 0.125 mcg/kg/min and then ultimately stopped 2 weeks ago. NYHA class III symptoms. Weights stable. Euvolemic. BPs improved and pt symptomatically much better - After CRT, he has tolerated milrinone discontinuation without problem. Co-ox stable. Will pull PICC line.  - Continue current Lasix dose and reduced dose of digoxin 0.125mg .  - Has been unable to tolerate efforts to get losartan back on  board even at lowest. Will hold off for now.  - Reinforced the need and importance of daily weights, a low sodium diet, and fluid restriction (less than 2 L a day). Instructed to call the HF clinic if weight increases more than 3 lbs overnight or 5 lbs in a week.  2. PAF: S/P DC-CV 09/2012.  No bleeding episodes on warfarin; doing much better on low-dose amiodarone  -last TSH 0.748 and AST 32/ALT 19  (7/15) 3. CAD: Extensive disease burden. No current signs/sx of ischemia. Continue statin. Lipids per PCP.  No aspirin given stable CAD and use of warfarin.  Benay Spice 12/10/2013

## 2013-12-17 ENCOUNTER — Encounter: Payer: Self-pay | Admitting: Internal Medicine

## 2014-01-06 ENCOUNTER — Encounter: Payer: Self-pay | Admitting: Internal Medicine

## 2014-01-06 ENCOUNTER — Ambulatory Visit (INDEPENDENT_AMBULATORY_CARE_PROVIDER_SITE_OTHER): Payer: Medicare HMO | Admitting: Internal Medicine

## 2014-01-06 VITALS — BP 102/44 | HR 60 | Ht 72.0 in | Wt 170.6 lb

## 2014-01-06 DIAGNOSIS — I5021 Acute systolic (congestive) heart failure: Secondary | ICD-10-CM

## 2014-01-06 DIAGNOSIS — I255 Ischemic cardiomyopathy: Secondary | ICD-10-CM

## 2014-01-06 DIAGNOSIS — Z23 Encounter for immunization: Secondary | ICD-10-CM

## 2014-01-06 DIAGNOSIS — I5022 Chronic systolic (congestive) heart failure: Secondary | ICD-10-CM

## 2014-01-06 DIAGNOSIS — I480 Paroxysmal atrial fibrillation: Secondary | ICD-10-CM

## 2014-01-06 DIAGNOSIS — Z95810 Presence of automatic (implantable) cardiac defibrillator: Secondary | ICD-10-CM

## 2014-01-06 DIAGNOSIS — Z45018 Encounter for adjustment and management of other part of cardiac pacemaker: Secondary | ICD-10-CM

## 2014-01-06 LAB — MDC_IDC_ENUM_SESS_TYPE_INCLINIC
Brady Statistic RA Percent Paced: 74 %
Brady Statistic RV Percent Paced: 99 %
Date Time Interrogation Session: 20151021120534
HighPow Impedance: 75.375
Implantable Pulse Generator Serial Number: 7201810
Lead Channel Impedance Value: 375 Ohm
Lead Channel Impedance Value: 512.5 Ohm
Lead Channel Pacing Threshold Amplitude: 0.75 V
Lead Channel Pacing Threshold Amplitude: 0.75 V
Lead Channel Pacing Threshold Amplitude: 1.5 V
Lead Channel Pacing Threshold Pulse Width: 0.5 ms
Lead Channel Pacing Threshold Pulse Width: 0.5 ms
Lead Channel Pacing Threshold Pulse Width: 0.5 ms
Lead Channel Sensing Intrinsic Amplitude: 11.8 mV
Lead Channel Sensing Intrinsic Amplitude: 4.1 mV
Lead Channel Setting Pacing Amplitude: 3.5 V
Lead Channel Setting Pacing Pulse Width: 0.5 ms
Lead Channel Setting Sensing Sensitivity: 0.5 mV
MDC IDC MSMT BATTERY REMAINING LONGEVITY: 49.2 mo
MDC IDC MSMT LEADCHNL LV IMPEDANCE VALUE: 375 Ohm
MDC IDC MSMT LEADCHNL LV PACING THRESHOLD AMPLITUDE: 1.5 V
MDC IDC MSMT LEADCHNL LV PACING THRESHOLD PULSEWIDTH: 0.5 ms
MDC IDC MSMT LEADCHNL RA PACING THRESHOLD AMPLITUDE: 1.75 V
MDC IDC MSMT LEADCHNL RA PACING THRESHOLD AMPLITUDE: 1.75 V
MDC IDC MSMT LEADCHNL RA PACING THRESHOLD PULSEWIDTH: 0.9 ms
MDC IDC MSMT LEADCHNL RA PACING THRESHOLD PULSEWIDTH: 0.9 ms
MDC IDC SET LEADCHNL LV PACING AMPLITUDE: 2.5 V
MDC IDC SET LEADCHNL RV PACING AMPLITUDE: 2.5 V
MDC IDC SET LEADCHNL RV PACING PULSEWIDTH: 0.5 ms
MDC IDC SET ZONE DETECTION INTERVAL: 315 ms
Zone Setting Detection Interval: 250 ms

## 2014-01-06 MED ORDER — NITROGLYCERIN 0.4 MG SL SUBL: 0.4000 mg | SUBLINGUAL_TABLET | SUBLINGUAL | Status: DC | PRN

## 2014-01-06 NOTE — Assessment & Plan Note (Signed)
His heart failure symptoms are now chronic and class III. He will continue his current medical therapy, and followup in our heart failure clinic.

## 2014-01-06 NOTE — Assessment & Plan Note (Signed)
His [employer] Jude biventricular ICD is working normally. We'll plan to recheck in several months.

## 2014-01-06 NOTE — Patient Instructions (Addendum)
Remote monitoring is used to monitor your ICD from home. This monitoring reduces the number of office visits required to check your device to one time per year. It allows [place] to keep an eye on the functioning of your device to ensure it is working properly. You are scheduled for a device check from home on 04/12/2014. You may send your transmission at any time that day. If you have a wireless device, the transmission will be sent automatically. After your physician reviews your transmission, you will receive a postcard with your next transmission date.  Your physician recommends that you schedule a follow-up appointment in: July 2016 with Dr.Taylor  Your physician recommends that you continue on your current medications as directed. Please refer to the Current Medication list given to you today.  You will receive your flu shot while here today

## 2014-01-06 NOTE — Assessment & Plan Note (Signed)
He is maintaining NSR. Continue amiodarone.

## 2014-01-06 NOTE — Progress Notes (Signed)
HPI Trevor Simpson returns today for followup. He is a very pleasant 80 year old man with a near end-stage ischemic cardiomyopathy, left branch block, paroxysmal atrial fibrillation, who underwent biventricular ICD implantation approximately 3 months ago. While he still remains weak, his symptoms have improved. He has not been rehospitalized for congestive heart failure. He denies anginal symptoms. He is currently working on his deck at home. He gets tired when he exercises. No peripheral edema. No ICD shock.  Allergies  Allergen Reactions  . Codeine     "tripped out on it"  . Morphine     "tripped out on it"     Current Outpatient Prescriptions  Medication Sig Dispense Refill  . ALPRAZolam (XANAX) 0.25 MG tablet Take 0.375 mg by mouth at bedtime.      Marland Kitchen amiodarone (PACERONE) 200 MG tablet Take 100 mg by mouth daily.      Marland Kitchen atorvastatin (LIPITOR) 20 MG tablet Take 20 mg by mouth daily at 6 PM.       . B Complex-C (SUPER B COMPLEX) TABS Take 1 tablet by mouth daily.       . calcium carbonate (OS-CAL) 600 MG TABS Take 600 mg by mouth daily with breakfast.       . cetirizine (ZYRTEC) 10 MG tablet Take 10 mg by mouth daily.       . digoxin (LANOXIN) 0.125 MG tablet Take 1 tablet (0.125 mg total) by mouth daily.  90 tablet  1  . furosemide (LASIX) 80 MG tablet Take 40 mg by mouth daily.       Marland Kitchen levothyroxine (SYNTHROID, LEVOTHROID) 112 MCG tablet Take 112 mcg by mouth daily.      . magnesium gluconate (MAGONATE) 500 MG tablet Take 500 mg by mouth daily.       . Multiple Vitamin (MULTIVITAMIN WITH MINERALS) TABS Take 1 tablet by mouth daily.      . nitroGLYCERIN (NITROSTAT) 0.4 MG SL tablet Place 1 tablet (0.4 mg total) under the tongue every 5 (five) minutes as needed for chest pain.  25 tablet  3  . Omega-3 Fatty Acids (FISH OIL) 1200 MG CAPS Take 1,200 mg by mouth 2 (two) times daily.      Marland Kitchen omeprazole (PRILOSEC) 20 MG capsule Take 20 mg by mouth daily as needed (indigestion).       .  Saw Palmetto, Serenoa repens, (SAW PALMETTO PO) Take 1 capsule by mouth 2 (two) times daily.      . tamsulosin (FLOMAX) 0.4 MG CAPS capsule Take 0.4 mg by mouth daily after supper.       . vitamin C (ASCORBIC ACID) 500 MG tablet Take 500 mg by mouth daily.        Marland Kitchen warfarin (COUMADIN) 4 MG tablet Take 4 mg by mouth daily.      . [DISCONTINUED] diltiazem (CARDIZEM CD) 120 MG 24 hr capsule        No current facility-administered medications for this visit.     Past Medical History  Diagnosis Date  . Coronary artery disease     a. LHC (08/2012): Lmain: long, 20% stenosis distally LAD: mod calcified and mild dz prox. diff dz proximally 30%, first diagonal mild dz prox and small, Ramus intermediate brach lg bifurcates mid vessel prox. 90-95% stenosis LCx: relatively sml, rise to single marginal and contiine AV groove, 1st marg. diff 60-70% dz stenosis prox RCA: occ. prox. R-L coll, successful stent remus interm branch BMS  . Other and unspecified  hyperlipidemia   . Hypertension   . Sinoatrial node dysfunction   . PVD (peripheral vascular disease)     s/p renal artery stent 20-04  . AAA (abdominal aortic aneurysm)     repaired  . Systolic congestive heart failure     a. ICM b.   . Ischemic cardiomyopathy   . Carotid artery occlusion   . Atrial fibrillation   . GERD (gastroesophageal reflux disease)   . Hypothyroidism   . Automatic implantable cardioverter-defibrillator in situ   . Myocardial infarction   . HCAP (healthcare-associated pneumonia) 2014  . Stroke 2014    left sided affected no residual effects  . Arthritis     "left elbow and shoulder" (09/24/2013)    ROS:   All systems reviewed and negative except as noted in the HPI.   Past Surgical History  Procedure Laterality Date  . Cardiac pacemaker placement  08/01/2009    st jude dual chamber; explanted 09/24/2013  . Colonoscopy    . Esophagogastroduodenoscopy    . Transluminal angioplasty    . Tee without cardioversion N/A  09/23/2012    Procedure: TRANSESOPHAGEAL ECHOCARDIOGRAM (TEE);  Surgeon: Lelon Perla, MD;  Location: [facility] ENDOSCOPY;  Service: Cardiovascular;  Laterality: N/A;  . Cardioversion N/A 09/23/2012    Procedure: CARDIOVERSION;  Surgeon: Lelon Perla, MD;  Location: [facility] ENDOSCOPY;  Service: Cardiovascular;  Laterality: N/A;  . Renal artery stent    . Abdominal aortic aneurysm repair  05/17/1993  . Coronary angioplasty with stent placement  08/2012    "1"  . Endarterectomy Right 11/25/2012    Procedure: ENDARTERECTOMY CAROTID With Dacron Patch Angioplasty;  Surgeon: Elam Dutch, MD;  Location: [facility];  Service: Vascular;  Laterality: Right;  . Carotid endarterectomy Right 11-25-12  . Inguinal hernia repair Bilateral     "one at a time"  . Bi-ventricular implantable cardioverter defibrillator  (crt-d)  09/24/2013  . Cataract extraction w/ intraocular lens  implant, bilateral Bilateral   . Coronary artery bypass graft  05/17/1993    "CABG X4"     Family History  Problem Relation Age of Onset  . CAD Father   . Hypertension Father   . Heart disease Father   . CAD Brother   . Heart disease Brother   . Heart attack Brother   . Diabetes Brother   . Lung cancer Sister   . Heart disease Sister   . Hypertension Sister   . Hypertension Mother   . Hypertension Daughter   . Cancer Son      History   Social History  . Marital Status: Married    Spouse Name: N/A    Number of Children: N/A  . Years of Education: N/A   Occupational History  . Not on file.   Social History Main Topics  . Smoking status: Former Smoker -- 1.00 packs/day for 50 years    Types: Cigarettes    Quit date: 05/17/1993  . Smokeless tobacco: Current User    Types: Chew  . Alcohol Use: No  . Drug Use: No  . Sexual Activity: Not Currently   Other Topics Concern  . Not on file   Social History Narrative  . No narrative on file     BP 102/44  Pulse 60  Ht 6' (1.829 m)  Wt 170 lb 9.6 oz (77.384 kg)  BMI  23.13 kg/m2  Physical Exam:  Well appearing 80 year old man, NAD HEENT: Unremarkable Neck:  No JVD, no thyromegally Back:  No  CVA tenderness Lungs:  Clear with no wheezes, rales, or rhonchi. HEART:  Regular rate rhythm, no murmurs, no rubs, no clicks Abd:  soft, positive bowel sounds, no organomegally, no rebound, no guarding Ext:  2 plus pulses, no edema, no cyanosis, no clubbing Skin:  No rashes no nodules Neuro:  CN II through XII intact, motor grossly intact   DEVICE  Normal device function.  See PaceArt for details.   Assess/Plan:

## 2014-02-08 ENCOUNTER — Ambulatory Visit (HOSPITAL_COMMUNITY)
Admission: RE | Admit: 2014-02-08 | Discharge: 2014-02-08 | Disposition: A | Discharge status: Home / Self Care | Payer: Medicare HMO | Source: Ambulatory Visit | Attending: Internal Medicine | Admitting: Internal Medicine

## 2014-02-08 VITALS — BP 130/62 | HR 64 | Wt 168.0 lb

## 2014-02-08 DIAGNOSIS — I2510 Atherosclerotic heart disease of native coronary artery without angina pectoris: Secondary | ICD-10-CM | POA: Diagnosis not present

## 2014-02-08 DIAGNOSIS — I5022 Chronic systolic (congestive) heart failure: Secondary | ICD-10-CM

## 2014-02-08 DIAGNOSIS — Z7901 Long term (current) use of anticoagulants: Secondary | ICD-10-CM | POA: Insufficient documentation

## 2014-02-08 DIAGNOSIS — I480 Paroxysmal atrial fibrillation: Secondary | ICD-10-CM | POA: Diagnosis not present

## 2014-02-08 LAB — CBC
HEMATOCRIT: 43.8 % (ref 39.0–52.0)
HEMOGLOBIN: 14.4 g/dL (ref 13.0–17.0)
MCH: 31 pg (ref 26.0–34.0)
MCHC: 32.9 g/dL (ref 30.0–36.0)
MCV: 94.4 fL (ref 78.0–100.0)
Platelets: 123 10*3/uL — ABNORMAL LOW (ref 150–400)
RBC: 4.64 MIL/uL (ref 4.22–5.81)
RDW: 13.7 % (ref 11.5–15.5)
WBC: 6.1 10*3/uL (ref 4.0–10.5)

## 2014-02-08 LAB — BASIC METABOLIC PANEL WITH GFR
Anion gap: 10 (ref 5–15)
BUN: 23 mg/dL (ref 6–23)
CO2: 32 meq/L (ref 19–32)
Calcium: 9.1 mg/dL (ref 8.4–10.5)
Chloride: 96 meq/L (ref 96–112)
Creatinine, Ser: 1.25 mg/dL (ref 0.50–1.35)
GFR calc Af Amer: 62 mL/min — ABNORMAL LOW
GFR calc non Af Amer: 53 mL/min — ABNORMAL LOW
Glucose, Bld: 83 mg/dL (ref 70–99)
Potassium: 3.8 meq/L (ref 3.7–5.3)
Sodium: 138 meq/L (ref 137–147)

## 2014-02-08 LAB — PROTIME-INR
INR: 2.57 — ABNORMAL HIGH (ref 0.00–1.49)
PROTHROMBIN TIME: 27.8 s — AB (ref 11.6–15.2)

## 2014-02-08 LAB — DIGOXIN LEVEL: Digoxin Level: 1.3 ng/mL (ref 0.8–2.0)

## 2014-02-08 MED ORDER — APIXABAN 5 MG PO TABS: 5.0000 mg | ORAL_TABLET | Freq: Two times a day (BID) | ORAL | Status: DC

## 2014-02-08 NOTE — Addendum Note (Signed)
Encounter addended by: Scarlette Calico, RN on: 02/08/2014 12:17 PM<BR>     Documentation filed: Visit Diagnoses, Medications, Dx Association, Patient Instructions Section, Orders

## 2014-02-08 NOTE — Patient Instructions (Signed)
Stop Warfarin  Start Eliquis 5 mg Twice daily STARTING ON Wednesday AM  Labs today  Your physician recommends that you schedule a follow-up appointment in: 3 months

## 2014-02-08 NOTE — Progress Notes (Signed)
Patient ID: Trevor Simpson, male   DOB: 1933/08/27, 80 y.o.   MRN: [id number] PCP: Dr Maudie Mercury Pulmonology: Dr Chase Caller Cardiology: Dr Lovena Le  HPI: Trevor Simpson is a 80 year old with history of  COPD, symptomatic bradycardia, PAF, HTN, PAD, CVA, chronic systolic heart failure and ischemic cardiomyopathy s/p CRT-D on 09/26/13  LHC 08/2012  Left mainstem: The left main is long with 20% stenosis distally.  Left anterior descending (LAD): The left anterior descending artery is moderately calcified in the proximal vessel. There is diffuse disease in the proximal vessel up to 30%. The first diagonal is relatively small and has mild disease proximally. The ramus intermediate branch is a large branch that bifurcates in the mid vessel. The proximal vessel has a 90-95% stenosis.  Left circumflex (LCx): The left circumflex is relatively small. It gives rise to a single marginal branch and then continues in the AV groove. The first marginal branch has diffuse 60-70% stenosis proximally.  Right coronary artery (RCA): The right coronary is occluded proximally. There are right to right and left to right collaterals. Severe two-vessel obstructive coronary disease. Chronic total occlusion of the right coronary Successful stenting of the ramus intermediate branch with a bare-metal stent  He was treated for RUL pneumonia 12/14. He developed hemoptysis and his PCP stopped Xarelto and placed him on levaquin.  At that time he was referred to pulmonary. [name] Stopped levaquin and started cipro and clindamycin and coumadin. F/u CXR has shown persistent RUL opacity and there is a question if this might be asymmetric edema.  However, CT showed scarring pattern likely from prior PNA.  Admitted in 6/15 with low-output. RHC as below. Started on milrinone. In July 2015 underwent CRT-D upgrade Mean RA 11 PA 53/24  Mean PCWP 22 CI 1.5  Echo (6/15): EF 15%, moderate central Trevor, mild to moderately decreased RV systolic function.    Follow-up: He presents today for scheduled f/u. Milrinone stopped in September 2015 due to patient preference, despite borderline co-ox. Has been doing well off the milrinone. Losartan 12.5 daily stopped due to SBP in 80s. Has good and bad days but says he feels good overall. Rates himself 7/10. Able to work out in yard.  Weight down about 5 pounds at home. Now about 160. Has good appetite. Systolic BP 124-580. Occasional dizziness. No swelling, orthopnea or PND. Can walk around all of [employer] without getting short of breath but has difficulty with inclines. Maintaining NSR on amio. Saw Dr. Lovena Le last month and ICD ok.   Labs:  09/28/12 AST 27 ALT 21  02/10/13 K 3.8 Creatinine  1.41  02/26/13 Pro BNP 1100 07/06/12: K 3.9, creatinine 1.4, BUN 22 6/15: K 4.1, creatinine 1.2 09/2013: K 3.7, creatinine 1.36, HCT 43.7, co-ox 66.5% 8/15: digoxin 1.2, co-ox 55%, K 4.0, creatinine 1.36 9/15: co-ox 56% 9/24: co-ox 56.8%  SH: Lives at home with wife  FH: Father, Brother - CAD HTN, DM        Sister - lung cancer    ROS: All systems reviewed and negative except as per HPI.   Current Outpatient Prescriptions  Medication Sig Dispense Refill  . ALPRAZolam (XANAX) 0.25 MG tablet Take 0.375 mg by mouth at bedtime.    Marland Kitchen amiodarone (PACERONE) 200 MG tablet Take 100 mg by mouth daily.    Marland Kitchen atorvastatin (LIPITOR) 20 MG tablet Take 20 mg by mouth daily at 6 PM.     . B Complex-C (SUPER B COMPLEX) TABS Take 1 tablet by  mouth daily.     . calcium carbonate (OS-CAL) 600 MG TABS Take 600 mg by mouth daily with breakfast.     . cetirizine (ZYRTEC) 10 MG tablet Take 10 mg by mouth daily.     . digoxin (LANOXIN) 0.125 MG tablet Take 1 tablet (0.125 mg total) by mouth daily. 90 tablet 1  . furosemide (LASIX) 80 MG tablet Take 40 mg by mouth daily.     Marland Kitchen levothyroxine (SYNTHROID, LEVOTHROID) 112 MCG tablet Take 112 mcg by mouth daily.    . magnesium gluconate (MAGONATE) 500 MG tablet Take 500 mg by mouth daily.      . Multiple Vitamin (MULTIVITAMIN WITH MINERALS) TABS Take 1 tablet by mouth daily.    . nitroGLYCERIN (NITROSTAT) 0.4 MG SL tablet Place 1 tablet (0.4 mg total) under the tongue every 5 (five) minutes as needed for chest pain. 25 tablet 3  . Omega-3 Fatty Acids (FISH OIL) 1200 MG CAPS Take 1,200 mg by mouth 2 (two) times daily.    Marland Kitchen omeprazole (PRILOSEC) 20 MG capsule Take 20 mg by mouth daily as needed (indigestion).     . Saw Palmetto, Serenoa repens, (SAW PALMETTO PO) Take 1 capsule by mouth 2 (two) times daily.    . tamsulosin (FLOMAX) 0.4 MG CAPS capsule Take 0.4 mg by mouth daily after supper.     . vitamin C (ASCORBIC ACID) 500 MG tablet Take 500 mg by mouth daily.      Marland Kitchen warfarin (COUMADIN) 4 MG tablet Take 4 mg by mouth daily.    . [DISCONTINUED] diltiazem (CARDIZEM CD) 120 MG 24 hr capsule      No current facility-administered medications for this encounter.     Allergies  Allergen Reactions  . Codeine     "tripped out on it"  . Morphine     "tripped out on it"    Filed Vitals:   02/08/14 1129  BP: 130/62  Pulse: 64  Weight: 168 lb (76.204 kg)  SpO2: 95%    PHYSICAL EXAM: General:  NAD. No respiratory difficulty. Family present. HEENT: normal Neck: supple. JVP 6-7; Carotids 2+ bilat; no bruits. No lymphadenopathy or thryomegaly appreciated. R neck scar Cor: Distant heart sounds. Regular rate & rhythm.  2/6 HSM apex.  2/6 TR Lungs: distant heart sounds. clear Abdomen: soft, nontender, nondistended. No hepatosplenomegaly. No bruits or masses. Good bowel sounds. Extremities: no cyanosis, clubbing, rash, no edema Neuro: alert & oriented x 3, cranial nerves grossly intact. moves all 4 extremities w/o difficulty. Affect pleasant.   ASSESSMENT & PLAN: 1. Chronic Systolic Heart Failure: Ischemic cardiomyopathy s/p CRT-D; Echo 6/15 with EF 15%, moderate Trevor, mild to moderately decreased RV systolic function.  RHC in 6/15 showed CI 1.5 so patient was started on milrinone.   Milrinone was titrated down to 0.125 mcg/kg/min and then ultimately stopped in 9/15. NYHA class III symptoms. Weights stable. Euvolemic. BPs improved and pt symptomatically much better - After CRT, he has tolerated milrinone discontinuation without problem - Continue current Lasix dose and reduced dose of digoxin 0.125mg . Check labs today.  - Has been unable to tolerate efforts to get losartan back on board even at lowest dose. Will hold off for now.  - Given previous low output will hold off on carvedilol for now. Can consider carvedilol 3.125 qhs at next visit. Consider ivabradine but HR already under 70.  - Reinforced the need and importance of daily weights, a low sodium diet, and fluid restriction (less than 2 L  a day). Instructed to call the HF clinic if weight increases more than 3 lbs overnight or 5 lbs in a week.  - Recheck echo 2. PAF: S/P DC-CV 09/2012.  No bleeding episodes on warfarin; doing much better on low-dose amiodarone per Dr. Lovena Le.  -last TSH 0.748 and AST 32/ALT 19  (7/15) -He would like to switch to Eliquis. Check INR today and hold coumadin for 2 days then start Eliquis 5 bid. 3. CAD: Extensive disease burden. No current signs/sx of ischemia. Continue statin. Lipids per PCP.  No aspirin given stable CAD and use of warfarin.  Benay Spice 02/08/2014

## 2014-02-10 ENCOUNTER — Telehealth (HOSPITAL_COMMUNITY): Payer: Self-pay | Admitting: *Deleted

## 2014-02-10 MED ORDER — DIGOXIN 125 MCG PO TABS
0.0625 mg | ORAL_TABLET | Freq: Every day | ORAL | Status: DC
Start: 2014-02-10 — End: 2014-06-16

## 2014-02-10 NOTE — Telephone Encounter (Signed)
-----   Message from Jolaine Artist, MD sent at 02/10/2014  1:52 PM EST ----- Cut digoxin in half.

## 2014-02-10 NOTE — Telephone Encounter (Signed)
Pt aware.

## 2014-02-15 ENCOUNTER — Other Ambulatory Visit: Payer: Self-pay | Admitting: Internal Medicine

## 2014-02-15 DIAGNOSIS — R0600 Dyspnea, unspecified: Secondary | ICD-10-CM

## 2014-02-16 ENCOUNTER — Encounter: Payer: Self-pay | Admitting: Internal Medicine

## 2014-02-16 ENCOUNTER — Ambulatory Visit (INDEPENDENT_AMBULATORY_CARE_PROVIDER_SITE_OTHER): Payer: Medicare HMO | Admitting: Internal Medicine

## 2014-02-16 VITALS — BP 140/62 | HR 62 | Ht 69.0 in | Wt 172.0 lb

## 2014-02-16 DIAGNOSIS — R0600 Dyspnea, unspecified: Secondary | ICD-10-CM | POA: Insufficient documentation

## 2014-02-16 DIAGNOSIS — R911 Solitary pulmonary nodule: Secondary | ICD-10-CM | POA: Insufficient documentation

## 2014-02-16 DIAGNOSIS — R042 Hemoptysis: Secondary | ICD-10-CM

## 2014-02-16 LAB — PULMONARY FUNCTION TEST
DL/VA % PRED: 64 %
DL/VA: 2.93 ml/min/mmHg/L
DLCO UNC % PRED: 53 %
DLCO UNC: 16.61 ml/min/mmHg
FEF 25-75 PRE: 2.39 L/s
FEF 25-75 Post: 2.79 L/sec
FEF2575-%Change-Post: 16 %
FEF2575-%PRED-PRE: 122 %
FEF2575-%Pred-Post: 143 %
FEV1-%CHANGE-POST: 4 %
FEV1-%Pred-Post: 108 %
FEV1-%Pred-Pre: 103 %
FEV1-Post: 3.02 L
FEV1-Pre: 2.89 L
FEV1FVC-%Change-Post: 9 %
FEV1FVC-%Pred-Pre: 105 %
FEV6-%Change-Post: -3 %
FEV6-%PRED-PRE: 103 %
FEV6-%Pred-Post: 99 %
FEV6-POST: 3.65 L
FEV6-Pre: 3.77 L
FEV6FVC-%PRED-POST: 107 %
FEV6FVC-%PRED-PRE: 107 %
FVC-%Change-Post: -4 %
FVC-%PRED-PRE: 97 %
FVC-%Pred-Post: 93 %
FVC-POST: 3.65 L
FVC-PRE: 3.81 L
PRE FEV6/FVC RATIO: 100 %
Post FEV1/FVC ratio: 83 %
Post FEV6/FVC ratio: 100 %
Pre FEV1/FVC ratio: 76 %
RV % pred: 144 %
RV: 3.72 L
TLC % PRED: 118 %
TLC: 8.14 L

## 2014-02-16 NOTE — Progress Notes (Signed)
Subjective:    Patient ID: Trevor Simpson, male    DOB: 06/29/1933, 80 y.o.   MRN: [id number]  PCP [name], MD   HPI    OV 02/16/2014  Chief Complaint  Patient presents with  . Follow-up    Pt here after PFT. Pt c/o DOE, mild cough with little mucus production and chest pain in midsternal area- pt stated he took 2 nitro and the pain resolved.    Follow-up hemoptysis late 2014/early 2015 due to pneumonia in setting of anti-coagulation, lung nodules, multifactorial dyspnea  Last seen March 2015. At that time hemoptysis was resolving/resolved. I suggested bronchoscopy because of CT scan showing nodules just to rule out any endobronchial lesion but due to cost issues he deferred and opted for follow-up. His other issue at that time was class II-III dyspnea with exertion relieved by rest. I recommended pulmonary rehabilitation back then.  Since that time he says that his dyspnea is improved. Although it is still there at class II-III levels. It is relieved by rest. He says that he was chronotropic incompetence. Since that time in June 2015 he's had a defibrillator placed. He knows works out at [employer]. There is no associated cough or wheezing. This normal hemoptysis.  He was supposed to have CT scan of the chest at this visit but he has not done that yet for the nodules and hemoptysis. In terms of his pulmonary function tests this was first 2015 this is normal spirometry and lung volumes but DLCO is reduced at 53%  He and his wife continue to state that they still have cost issues and will be judicious about the timing and type of interventions    has a past medical history of Coronary artery disease; Other and unspecified hyperlipidemia; Hypertension; Sinoatrial node dysfunction; PVD (peripheral vascular disease); AAA (abdominal aortic aneurysm); Systolic congestive heart failure; Ischemic cardiomyopathy; Carotid artery occlusion; Atrial fibrillation; GERD (gastroesophageal reflux  disease); Hypothyroidism; Automatic implantable cardioverter-defibrillator in situ; Myocardial infarction; HCAP (healthcare-associated pneumonia) (2014); Stroke (2014); and Arthritis.   reports that he quit smoking about 20 years ago. His smoking use included Cigarettes. He has a 50 pack-year smoking history. His smokeless tobacco use includes Chew.  Past Surgical History  Procedure Laterality Date  . Cardiac pacemaker placement  08/01/2009    st jude dual chamber; explanted 09/24/2013  . Colonoscopy    . Esophagogastroduodenoscopy    . Transluminal angioplasty    . Tee without cardioversion N/A 09/23/2012    Procedure: TRANSESOPHAGEAL ECHOCARDIOGRAM (TEE);  Surgeon: Lelon Perla, MD;  Location: [facility] ENDOSCOPY;  Service: Cardiovascular;  Laterality: N/A;  . Cardioversion N/A 09/23/2012    Procedure: CARDIOVERSION;  Surgeon: Lelon Perla, MD;  Location: [facility] ENDOSCOPY;  Service: Cardiovascular;  Laterality: N/A;  . Renal artery stent    . Abdominal aortic aneurysm repair  05/17/1993  . Coronary angioplasty with stent placement  08/2012    "1"  . Endarterectomy Right 11/25/2012    Procedure: ENDARTERECTOMY CAROTID With Dacron Patch Angioplasty;  Surgeon: Elam Dutch, MD;  Location: [facility];  Service: Vascular;  Laterality: Right;  . Carotid endarterectomy Right 11-25-12  . Inguinal hernia repair Bilateral     "one at a time"  . Bi-ventricular implantable cardioverter defibrillator  (crt-d)  09/24/2013  . Cataract extraction w/ intraocular lens  implant, bilateral Bilateral   . Coronary artery bypass graft  05/17/1993    "CABG X4"    Allergies  Allergen Reactions  . Codeine     "  tripped out on it"  . Morphine     "tripped out on it"    Immunization History  Administered Date(s) Administered  . Influenza,inj,Quad PF,36+ Mos 11/26/2012, 01/06/2014  . Pneumococcal Polysaccharide-23 01/18/2012    Family History  Problem Relation Age of Onset  . CAD Father   . Hypertension Father   .  Heart disease Father   . CAD Brother   . Heart disease Brother   . Heart attack Brother   . Diabetes Brother   . Lung cancer Sister   . Heart disease Sister   . Hypertension Sister   . Hypertension Mother   . Hypertension Daughter   . Cancer Son     Current outpatient prescriptions: ALPRAZolam (XANAX) 0.25 MG tablet, Take 0.375 mg by mouth at bedtime., Disp: , Rfl: ;  amiodarone (PACERONE) 200 MG tablet, Take 100 mg by mouth daily., Disp: , Rfl: ;  apixaban (ELIQUIS) 5 MG TABS tablet, Take 1 tablet (5 mg total) by mouth 2 (two) times daily., Disp: 60 tablet, Rfl: 6;  atorvastatin (LIPITOR) 20 MG tablet, Take 20 mg by mouth daily at 6 PM. , Disp: , Rfl:  B Complex-C (SUPER B COMPLEX) TABS, Take 1 tablet by mouth daily. , Disp: , Rfl: ;  calcium carbonate (OS-CAL) 600 MG TABS, Take 600 mg by mouth daily with breakfast. , Disp: , Rfl: ;  cetirizine (ZYRTEC) 10 MG tablet, Take 10 mg by mouth daily. , Disp: , Rfl: ;  digoxin (LANOXIN) 0.125 MG tablet, Take 0.5 tablets (0.0625 mg total) by mouth daily., Disp: 90 tablet, Rfl: 1;  furosemide (LASIX) 80 MG tablet, Take 40 mg by mouth daily. , Disp: , Rfl:  levothyroxine (SYNTHROID, LEVOTHROID) 112 MCG tablet, Take 112 mcg by mouth daily., Disp: , Rfl: ;  magnesium gluconate (MAGONATE) 500 MG tablet, Take 500 mg by mouth daily. , Disp: , Rfl: ;  Multiple Vitamin (MULTIVITAMIN WITH MINERALS) TABS, Take 1 tablet by mouth daily., Disp: , Rfl:  nitroGLYCERIN (NITROSTAT) 0.4 MG SL tablet, Place 1 tablet (0.4 mg total) under the tongue every 5 (five) minutes as needed for chest pain., Disp: 25 tablet, Rfl: 3;  Omega-3 Fatty Acids (FISH OIL) 1200 MG CAPS, Take 1,200 mg by mouth 2 (two) times daily., Disp: , Rfl: ;  omeprazole (PRILOSEC) 20 MG capsule, Take 20 mg by mouth daily as needed (indigestion). , Disp: , Rfl:  Saw Palmetto, Serenoa repens, (SAW PALMETTO PO), Take 1 capsule by mouth 2 (two) times daily., Disp: , Rfl: ;  tamsulosin (FLOMAX) 0.4 MG CAPS capsule,  Take 0.4 mg by mouth daily after supper. , Disp: , Rfl: ;  vitamin C (ASCORBIC ACID) 500 MG tablet, Take 500 mg by mouth daily.  , Disp: , Rfl: ;  [DISCONTINUED] diltiazem (CARDIZEM CD) 120 MG 24 hr capsule, , Disp: , Rfl:    Review of Systems  Constitutional: Negative for fever and unexpected weight change.  HENT: Negative for congestion, dental problem, ear pain, nosebleeds, postnasal drip, rhinorrhea, sinus pressure, sneezing, sore throat and trouble swallowing.   Eyes: Negative for redness and itching.  Respiratory: Positive for cough and shortness of breath. Negative for chest tightness and wheezing.   Cardiovascular: Positive for chest pain. Negative for palpitations and leg swelling.  Gastrointestinal: Negative for nausea and vomiting.  Genitourinary: Negative for dysuria.  Musculoskeletal: Negative for joint swelling.  Skin: Negative for rash.  Neurological: Negative for headaches.  Hematological: Does not bruise/bleed easily.  Psychiatric/Behavioral: Negative for dysphoric mood.  The patient is not nervous/anxious.        Objective:   Physical Exam  Constitutional: He is oriented to person, place, and time. He appears well-developed and well-nourished. No distress.  Body mass index is 25.39 kg/(m^2).   HENT:  Head: Normocephalic and atraumatic.  Right Ear: External ear normal.  Left Ear: External ear normal.  Mouth/Throat: Oropharynx is clear and moist. No oropharyngeal exudate.  Eyes: Conjunctivae and EOM are normal. Pupils are equal, round, and reactive to light. Right eye exhibits no discharge. Left eye exhibits no discharge. No scleral icterus.  Neck: Normal range of motion. Neck supple. No JVD present. No tracheal deviation present. No thyromegaly present.  Cardiovascular: Normal rate, regular rhythm and intact distal pulses.  Exam reveals no gallop and no friction rub.   No murmur heard. Pulmonary/Chest: Effort normal and breath sounds normal. No respiratory distress.  He has no wheezes. He has no rales. He exhibits no tenderness.  Abdominal: Soft. Bowel sounds are normal. He exhibits no distension and no mass. There is no tenderness. There is no rebound and no guarding.  Musculoskeletal: Normal range of motion. He exhibits no edema or tenderness.  Lymphadenopathy:    He has no cervical adenopathy.  Neurological: He is alert and oriented to person, place, and time. He has normal reflexes. No cranial nerve deficit. Coordination normal.  Skin: Skin is warm and dry. No rash noted. He is not diaphoretic. No erythema. No pallor.  Psychiatric: He has a normal mood and affect. His behavior is normal. Judgment and thought content normal.  Nursing note and vitals reviewed.    Filed Vitals:   02/16/14 1019  BP: 140/62  Pulse: 62  Height: 5\' 9"  (1.753 m)  Weight: 172 lb (78.019 kg)  SpO2: 93%        Assessment & Plan:     ICD-9-CM ICD-10-CM   1. Hemoptysis 786.30 R04.2   2. Lung nodule 793.11 R91.1 CT Chest Wo Contrast  3. Dyspnea 786.09 R06.00     #Hemoptysis and lung nodule  - glad you are no longer coughing blood - repeat CT chest wo contrast sometime in dec 2015   #Shortness of breath and fatigue  -due to heart and physical deonditioning - continue exercises at [facility]  - breathing test 02/16/2014 shows moderate in-efficiency in oxygen transfer  - Please start spiriva 1 puff daily - take sample and try  It for a month and call us with feedback   #FOllowup Dec 2015 CT chest 1 month call us with response to spiriva  6 months with NP Tammy     Dr. Brand Males, M.D., [facility].C.P Pulmonary and Critical Care Medicine Staff Physician [place] [facility] Pager: [phone], If no answer or between  15:00h - 7:00h: call [phone]  02/16/2014 10:48 AM

## 2014-02-16 NOTE — Progress Notes (Signed)
PFT done today. 

## 2014-02-16 NOTE — Patient Instructions (Addendum)
#  Hemoptysis and lung nodule  - glad you are no longer coughing blood - repeat CT chest wo contrast sometime in dec 2015   #Shortness of breath and fatigue  -due to heart and physical deonditioning - continue exercises at [facility]  - breathing test 02/16/2014 shows moderate in-efficiency in oxygen transfer  - Please start spiriva 1 puff daily - take sample and try  It for a month and call us with feedback   #FOllowup Dec 2015 CT chest 1 month call us with response to spiriva  6 months with NP Tammy

## 2014-02-24 ENCOUNTER — Encounter: Payer: Self-pay | Admitting: Gastroenterology

## 2014-02-25 ENCOUNTER — Encounter (HOSPITAL_COMMUNITY): Payer: Self-pay | Admitting: Cardiology

## 2014-03-03 ENCOUNTER — Ambulatory Visit (INDEPENDENT_AMBULATORY_CARE_PROVIDER_SITE_OTHER): Payer: Medicare HMO | Admitting: Gastroenterology

## 2014-03-03 ENCOUNTER — Telehealth: Payer: Self-pay | Admitting: *Deleted

## 2014-03-03 ENCOUNTER — Encounter: Payer: Self-pay | Admitting: Gastroenterology

## 2014-03-03 ENCOUNTER — Ambulatory Visit (INDEPENDENT_AMBULATORY_CARE_PROVIDER_SITE_OTHER)
Admission: RE | Admit: 2014-03-03 | Discharge: 2014-03-03 | Disposition: A | Discharge status: Home / Self Care | Payer: Medicare HMO | Source: Ambulatory Visit | Attending: Internal Medicine | Admitting: Internal Medicine

## 2014-03-03 VITALS — BP 118/58 | HR 64 | Ht 72.0 in | Wt 166.5 lb

## 2014-03-03 DIAGNOSIS — R911 Solitary pulmonary nodule: Secondary | ICD-10-CM

## 2014-03-03 DIAGNOSIS — Z7901 Long term (current) use of anticoagulants: Secondary | ICD-10-CM

## 2014-03-03 DIAGNOSIS — K5900 Constipation, unspecified: Secondary | ICD-10-CM

## 2014-03-03 DIAGNOSIS — Z800 Family history of malignant neoplasm of digestive organs: Secondary | ICD-10-CM | POA: Insufficient documentation

## 2014-03-03 DIAGNOSIS — Z86010 Personal history of colonic polyps: Secondary | ICD-10-CM

## 2014-03-03 NOTE — Progress Notes (Signed)
Case reviewed with advanced practitioner. Patient has had diminutive adenoma in the past. We will obtain old colonoscopy report to make sure the examination was adequate. If so, given his age and comorbidities (as well as lack of relevant GI complaints or issues) I do not feel that routine surveillance colonoscopy would be appropriate as the risk is not justifiable.

## 2014-03-03 NOTE — Telephone Encounter (Signed)
Left a message for patient to call back. 

## 2014-03-03 NOTE — Progress Notes (Signed)
03/03/2014 LYNDON CHAPEL [id number] 1933-09-05   HISTORY OF PRESENT ILLNESS:  This is a 80 year old male who is previously known to Dr. Lajoyce Corners, but has been referred here by his PCP, Dr. Maudie Mercury, to discuss colonoscopy.  We do not have actual colonoscopy records, but we have pathology report from colonoscopy in 09/2005, which shows one adenomatous polyp removed from the cecum.  Reports patient's PCP says that he had one in 2009 as well and patient thinks that he thinks that one was normal (no polyps).  Patient does have a family history of colon cancer in his son who died in his late 45's from that disease.  He has multiple medical problems including CAD s/p CABG, carotid artery disease requiring CEA, PVD with stents, AAA with repair, history of CVA in 2014, atrial fibrillation on Eliquis, ICD placement, HTN, HLD, and hypothyroidism.  He is not anemic; recent Hgb 13.9 grams.  He does admit to constipation and is using only Metamucil daily and prune juice prn currently.  Is asking what else he can use.     Past Medical History  Diagnosis Date  . Coronary artery disease     a. LHC (08/2012): Lmain: long, 20% stenosis distally LAD: mod calcified and mild dz prox. diff dz proximally 30%, first diagonal mild dz prox and small, Ramus intermediate brach lg bifurcates mid vessel prox. 90-95% stenosis LCx: relatively sml, rise to single marginal and contiine AV groove, 1st marg. diff 60-70% dz stenosis prox RCA: occ. prox. R-L coll, successful stent remus interm branch BMS  . Other and unspecified hyperlipidemia   . Hypertension   . Sinoatrial node dysfunction   . PVD (peripheral vascular disease)     s/p renal artery stent 20-04  . AAA (abdominal aortic aneurysm)     repaired  . Systolic congestive heart failure     a. ICM b.   . Ischemic cardiomyopathy   . Carotid artery occlusion   . Atrial fibrillation   . GERD (gastroesophageal reflux disease)   . Hypothyroidism   . Automatic implantable  cardioverter-defibrillator in situ   . Myocardial infarction   . HCAP (healthcare-associated pneumonia) 2014  . Stroke 2014    left sided affected no residual effects  . Arthritis     "left elbow and shoulder" (09/24/2013)  . Anxiety   . Colon polyp   . Congestive heart failure   . H/O blood clots   . Hyperlipidemia   . Pneumonia    Past Surgical History  Procedure Laterality Date  . Cardiac pacemaker placement  08/01/2009    st jude dual chamber; explanted 09/24/2013  . Colonoscopy    . Esophagogastroduodenoscopy    . Transluminal angioplasty    . Tee without cardioversion N/A 09/23/2012    Procedure: TRANSESOPHAGEAL ECHOCARDIOGRAM (TEE);  Surgeon: Lelon Perla, MD;  Location: [facility] ENDOSCOPY;  Service: Cardiovascular;  Laterality: N/A;  . Cardioversion N/A 09/23/2012    Procedure: CARDIOVERSION;  Surgeon: Lelon Perla, MD;  Location: [facility] ENDOSCOPY;  Service: Cardiovascular;  Laterality: N/A;  . Renal artery stent    . Abdominal aortic aneurysm repair  05/17/1993  . Coronary angioplasty with stent placement  08/2012    "1"  . Endarterectomy Right 11/25/2012    Procedure: ENDARTERECTOMY CAROTID With Dacron Patch Angioplasty;  Surgeon: Elam Dutch, MD;  Location: [facility];  Service: Vascular;  Laterality: Right;  . Carotid endarterectomy Right 11-25-12  . Inguinal hernia repair Bilateral     "  one at a time"  . Bi-ventricular implantable cardioverter defibrillator  (crt-d)  09/24/2013  . Cataract extraction w/ intraocular lens  implant, bilateral Bilateral   . Coronary artery bypass graft  05/17/1993    "CABG X4"  . Left and right heart catheterization with coronary angiogram N/A 09/15/2012    Procedure: LEFT AND RIGHT HEART CATHETERIZATION WITH CORONARY ANGIOGRAM;  Surgeon: Peter M Martinique, MD;  Location: [facility] CATH LAB;  Service: Cardiovascular;  Laterality: N/A;  . Percutaneous coronary stent intervention (pci-s)  09/15/2012    Procedure: PERCUTANEOUS CORONARY STENT INTERVENTION (PCI-S);   Surgeon: Peter M Martinique, MD;  Location: [facility] CATH LAB;  Service: Cardiovascular;;  . Right heart catheterization N/A 08/31/2013    Procedure: RIGHT HEART CATH;  Surgeon: Jolaine Artist, MD;  Location: [facility] CATH LAB;  Service: Cardiovascular;  Laterality: N/A;  . Bi-ventricular implantable cardioverter defibrillator N/A 09/24/2013    Procedure: BI-VENTRICULAR IMPLANTABLE CARDIOVERTER DEFIBRILLATOR  (CRT-D);  Surgeon: Evans Lance, MD;  Location: [facility] CATH LAB;  Service: Cardiovascular;  Laterality: N/A;  . Defribrillator      reports that he quit smoking about 20 years ago. His smoking use included Cigarettes. He has a 50 pack-year smoking history. His smokeless tobacco use includes Chew. He reports that he does not drink alcohol or use illicit drugs. family history includes CAD in his brother and father; Colon cancer in his son; Colon polyps in his brother; Diabetes in his brother; Heart attack in his brother; Heart disease in his brother, father, and sister; Hypertension in his daughter, father, mother, and sister; Lung cancer in his sister. Allergies  Allergen Reactions  . Codeine     "tripped out on it"  . Morphine     "tripped out on it"      Outpatient Encounter Prescriptions as of 03/03/2014  Medication Sig  . ALPRAZolam (XANAX) 0.25 MG tablet Take 0.375 mg by mouth at bedtime.  Marland Kitchen amiodarone (PACERONE) 200 MG tablet Take 100 mg by mouth daily.  Marland Kitchen apixaban (ELIQUIS) 5 MG TABS tablet Take 1 tablet (5 mg total) by mouth 2 (two) times daily.  Marland Kitchen atorvastatin (LIPITOR) 20 MG tablet Take 20 mg by mouth daily at 6 PM.   . B Complex-C (SUPER B COMPLEX) TABS Take 1 tablet by mouth daily.   . calcium carbonate (OS-CAL) 600 MG TABS Take 600 mg by mouth daily with breakfast.   . cetirizine (ZYRTEC) 10 MG tablet Take 10 mg by mouth daily.   . digoxin (LANOXIN) 0.125 MG tablet Take 0.5 tablets (0.0625 mg total) by mouth daily.  . furosemide (LASIX) 80 MG tablet Take 40 mg by mouth daily.   Marland Kitchen  levothyroxine (SYNTHROID, LEVOTHROID) 112 MCG tablet Take 112 mcg by mouth daily.  . magnesium gluconate (MAGONATE) 500 MG tablet Take 500 mg by mouth daily.   . Multiple Vitamin (MULTIVITAMIN WITH MINERALS) TABS Take 1 tablet by mouth daily.  . nitroGLYCERIN (NITROSTAT) 0.4 MG SL tablet Place 1 tablet (0.4 mg total) under the tongue every 5 (five) minutes as needed for chest pain.  . Omega-3 Fatty Acids (FISH OIL) 1200 MG CAPS Take 1,200 mg by mouth 2 (two) times daily.  Marland Kitchen omeprazole (PRILOSEC) 20 MG capsule Take 20 mg by mouth daily as needed (indigestion).   . Saw Palmetto, Serenoa repens, (SAW PALMETTO PO) Take 1 capsule by mouth 2 (two) times daily.  . tamsulosin (FLOMAX) 0.4 MG CAPS capsule Take 0.4 mg by mouth daily after supper.   . vitamin C (  ASCORBIC ACID) 500 MG tablet Take 500 mg by mouth daily.       REVIEW OF SYSTEMS  : All other systems reviewed and negative except where noted in the History of Present Illness.   PHYSICAL EXAM: BP 118/58 mmHg  Pulse 64  Ht 6' (1.829 m)  Wt 166 lb 8 oz (75.524 kg)  BMI 22.58 kg/m2 General: Well developed white male in no acute distress Head: Normocephalic and atraumatic Eyes:  Sclerae anicteric, conjunctiva pink. Ears: Normal auditory acuity Lungs: Clear throughout to auscultation Heart: Regular rate and rhythm Abdomen: Soft, non-distended.  Normal bowel sounds.  Non-tender. Musculoskeletal: Symmetrical with no gross deformities  Skin: No lesions on visible extremities Extremities: No edema  Neurological: Alert oriented x 4, grossly non-focal Psychological:  Alert and cooperative. Normal mood and affect  ASSESSMENT AND PLAN: -Personal history of colon polyps and family history of colon cancer in a son:  Discussed with Dr. Henrene Pastor.  At this point we think that the risk for colonoscopy outweighs the benefit with his multiple other medical problems.  Discussed with the patient and his wife; patient is agreeable to defer colonoscopy at this  time at our recommendation.  Will try to obtain colonoscopy records from Dr. Shan Levans office. -Constipation:  Currently only using prune juice prn and metamucil daily.  Will try Miralax once daily and increase to twice a day if needed to start. -Multiple medical problems including CAD s/p CABG, carotid artery disease requiring CEA, PVD with stents, AAA with repair, history of CVA in 2014, atrial fibrillation on Eliquis, ICD placement, HTN, HLD, and hypothyroidism. -Chronic anticoagulation on Eliquis  **We just received records from colonoscopy procedures.  Last colonoscopy 01/2008 showed only internal hemorrhoids.

## 2014-03-03 NOTE — Telephone Encounter (Signed)
-----   Message from [place] D. Zehr, PA-C sent at 03/03/2014 12:26 PM EST ----- Regarding: Colonoscopy Please let patient know that last colonoscopy 01/2008 was normal, no polyps.  Does not need colonoscopy as we discussed at his visit today.  Thank you,  Jess

## 2014-03-03 NOTE — Patient Instructions (Signed)
We will obtain your records from Dr. Lajoyce Corners.  Please purchase Miralax over the counter, mix in eight ounces of juice or water and drink once or twice daily.

## 2014-03-04 NOTE — Telephone Encounter (Signed)
Patient notified of recommendations. 

## 2014-03-05 ENCOUNTER — Telehealth: Payer: Self-pay | Admitting: Internal Medicine

## 2014-03-05 NOTE — Telephone Encounter (Signed)
lmtcb for pt.  

## 2014-03-05 NOTE — Telephone Encounter (Signed)
  CT chest without lung cancer or nodule. Only emphysema and scarring from pneumonia. Good new. FU in 6 months per priopr OV  Also, ask him if spiriva is helping dyspnea ? IF so, give him refills  Ct Chest Wo Contrast  03/03/2014   CLINICAL DATA:  Follow up pulmonary nodule.  Subsequent encounter.  EXAM: CT CHEST WITHOUT CONTRAST  TECHNIQUE: Multidetector CT imaging of the chest was performed following the standard protocol without IV contrast.  COMPARISON:  Chest CTs ranging from 08/13/2009 through 04/28/2013.  FINDINGS: Mediastinum: Small partially calcified mediastinal lymph nodes are stable, not pathologically enlarged. The thyroid gland, trachea and esophagus demonstrate no significant findings. The heart size is normal. There is no pericardial effusion.There is stable diffuse atherosclerosis of the aorta, great vessels and coronary arteries. Pacemaker leads appear unchanged.  Lungs/Pleura: There is no pleural effusion.Diffuse changes of centrilobular emphysema are stable. There is stable scarring in the right middle lobe and lingula. There are no suspicious pulmonary nodules. There is no endobronchial lesion.  Upper abdomen: Stable appearance. There is a tiny nonobstructing calculus in the upper pole of the right kidney.  Musculoskeletal/Chest wall: There is no axillary adenopathy or chest wall mass. There is stable thoracic spondylosis and anterior wedging. No acute osseous findings demonstrated.  IMPRESSION: 1. Stable chronic lung disease with emphysema and scarring. 2. No suspicious pulmonary nodules or acute findings. Previously noted pleural effusions have resolved. 3. Stable extensive atherosclerosis.   Electronically Signed   By: Camie Patience M.D.   On: 03/03/2014 13:53

## 2014-03-09 NOTE — Telephone Encounter (Signed)
lmtcb for pt.  

## 2014-03-10 ENCOUNTER — Other Ambulatory Visit: Payer: Self-pay | Admitting: Internal Medicine

## 2014-03-10 MED ORDER — TIOTROPIUM BROMIDE MONOHYDRATE 18 MCG IN CAPS: 18.0000 ug | ORAL_CAPSULE | Freq: Every day | RESPIRATORY_TRACT | Status: DC

## 2014-03-10 NOTE — Telephone Encounter (Signed)
Pt returned call. Pt stated his breathing has improved since last OV but he has not been using the spiriva. Informed pt to use the spiriva once a day and to call if there are any issues. Rx sent to preferred pharmacy. Pt verbalized understanding and denied any further questions or concerns at this time.

## 2014-03-13 ENCOUNTER — Other Ambulatory Visit (HOSPITAL_COMMUNITY): Payer: Self-pay | Admitting: Internal Medicine

## 2014-03-16 ENCOUNTER — Encounter: Payer: Self-pay | Admitting: Gastroenterology

## 2014-04-05 ENCOUNTER — Telehealth (HOSPITAL_COMMUNITY): Payer: Self-pay | Admitting: Vascular Surgery

## 2014-04-05 NOTE — Telephone Encounter (Signed)
Spoke w/pt, have called drug rep and they are going to bring pt some samples, he is aware, will call him when we get them.  He also states he is having trouble sleeping, he states he goes to sleep and sleeps about 1-2 hours then is up until about 4-5 am, will discuss w/Dr Bensimhon and call him back

## 2014-04-05 NOTE — Telephone Encounter (Signed)
Please call pt about his Eliquis

## 2014-04-07 NOTE — Telephone Encounter (Signed)
Have samples for pt, Left message to call back

## 2014-04-08 NOTE — Telephone Encounter (Signed)
Pt aware to come by and pick up samples

## 2014-04-12 ENCOUNTER — Ambulatory Visit (INDEPENDENT_AMBULATORY_CARE_PROVIDER_SITE_OTHER): Payer: Medicare HMO | Admitting: *Deleted

## 2014-04-12 ENCOUNTER — Encounter: Payer: Self-pay | Admitting: Internal Medicine

## 2014-04-12 DIAGNOSIS — I5022 Chronic systolic (congestive) heart failure: Secondary | ICD-10-CM

## 2014-04-12 DIAGNOSIS — I255 Ischemic cardiomyopathy: Secondary | ICD-10-CM

## 2014-04-12 LAB — MDC_IDC_ENUM_SESS_TYPE_REMOTE
Battery Remaining Longevity: 46 mo
Battery Voltage: 3.01 V
Brady Statistic AP VP Percent: 74 %
Brady Statistic AP VS Percent: 1 %
Brady Statistic AS VS Percent: 1 %
Brady Statistic RA Percent Paced: 74 %
Date Time Interrogation Session: 20160125070017
HIGH POWER IMPEDANCE MEASURED VALUE: 65 Ohm
HighPow Impedance: 65 Ohm
Implantable Pulse Generator Serial Number: 7201810
Lead Channel Sensing Intrinsic Amplitude: 12 mV
Lead Channel Sensing Intrinsic Amplitude: 4.7 mV
Lead Channel Setting Pacing Amplitude: 2.5 V
Lead Channel Setting Pacing Pulse Width: 0.5 ms
Lead Channel Setting Pacing Pulse Width: 0.5 ms
MDC IDC MSMT BATTERY REMAINING PERCENTAGE: 86 %
MDC IDC MSMT LEADCHNL LV IMPEDANCE VALUE: 360 Ohm
MDC IDC MSMT LEADCHNL LV PACING THRESHOLD AMPLITUDE: 1.5 V
MDC IDC MSMT LEADCHNL LV PACING THRESHOLD PULSEWIDTH: 0.5 ms
MDC IDC MSMT LEADCHNL RA IMPEDANCE VALUE: 360 Ohm
MDC IDC MSMT LEADCHNL RV IMPEDANCE VALUE: 460 Ohm
MDC IDC SET LEADCHNL LV PACING AMPLITUDE: 2.5 V
MDC IDC SET LEADCHNL RA PACING AMPLITUDE: 3.5 V
MDC IDC SET LEADCHNL RV SENSING SENSITIVITY: 0.5 mV
MDC IDC SET ZONE DETECTION INTERVAL: 250 ms
MDC IDC STAT BRADY AS VP PERCENT: 25 %
Zone Setting Detection Interval: 315 ms

## 2014-04-12 NOTE — Progress Notes (Signed)
Remote ICD transmission.   

## 2014-04-21 ENCOUNTER — Encounter: Payer: Self-pay | Admitting: Cardiology

## 2014-05-11 ENCOUNTER — Encounter: Payer: Self-pay | Admitting: Internal Medicine

## 2014-06-15 ENCOUNTER — Other Ambulatory Visit (HOSPITAL_COMMUNITY): Payer: Self-pay | Admitting: *Deleted

## 2014-06-16 ENCOUNTER — Encounter: Payer: Self-pay | Admitting: Family

## 2014-06-16 ENCOUNTER — Other Ambulatory Visit: Payer: Self-pay

## 2014-06-16 MED ORDER — DIGOXIN 125 MCG PO TABS: 0.0625 mg | ORAL_TABLET | Freq: Every day | ORAL | Status: DC

## 2014-06-17 ENCOUNTER — Ambulatory Visit (HOSPITAL_COMMUNITY)
Admission: RE | Admit: 2014-06-17 | Discharge: 2014-06-17 | Disposition: A | Discharge status: Home / Self Care | Payer: Medicare HMO | Source: Ambulatory Visit | Attending: Vascular Surgery | Admitting: Vascular Surgery

## 2014-06-17 ENCOUNTER — Encounter: Payer: Self-pay | Admitting: Family

## 2014-06-17 ENCOUNTER — Ambulatory Visit (INDEPENDENT_AMBULATORY_CARE_PROVIDER_SITE_OTHER): Payer: Medicare HMO | Admitting: Family

## 2014-06-17 VITALS — BP 134/70 | HR 60 | Resp 16 | Ht 72.0 in | Wt 152.4 lb

## 2014-06-17 DIAGNOSIS — Z9889 Other specified postprocedural states: Secondary | ICD-10-CM

## 2014-06-17 DIAGNOSIS — I739 Peripheral vascular disease, unspecified: Secondary | ICD-10-CM | POA: Insufficient documentation

## 2014-06-17 DIAGNOSIS — Z48812 Encounter for surgical aftercare following surgery on the circulatory system: Secondary | ICD-10-CM | POA: Diagnosis not present

## 2014-06-17 DIAGNOSIS — Z95828 Presence of other vascular implants and grafts: Secondary | ICD-10-CM

## 2014-06-17 DIAGNOSIS — I6523 Occlusion and stenosis of bilateral carotid arteries: Secondary | ICD-10-CM | POA: Diagnosis not present

## 2014-06-17 NOTE — Progress Notes (Signed)
Established Carotid Patient   History of Present Illness  Trevor Simpson is a 81 y.o. male patient of Dr. Oneida Alar who returns for followup s/p right carotid endarterectomy on 11/25/2012. This was a symptomatic carotid stenosis with a stroke as manifested by left hemiparesis and slurred speech before the CEA. He denies any symptoms of TIA or stroke since the CEA.  He also is s/p AAA open repair in 1995, also possible bpg of iliac arteries and renal arteries for stenosis.   He has known degenerative changes in his cervical spine. This is now resolved. His swallowing is normal. He has had no changes in his voice. He has no slurred speech. The patient denies New Medical or Surgical History. Pt's daughter states his EF is 20-25%.  June 2015 2D echocardiogram results reviewed: LV EF of 15%.  He exercises an hour daily. He denies dizziness, denies tingling, numbness, pain, or weakness in either UE.  Pt Diabetic: No Pt smoker: former smoker, quit in 1995  Pt meds include: Statin : Yes ASA: No Other anticoagulants/antiplatelets: Eliquis for atrial fib.   Past Medical History  Diagnosis Date  . Coronary artery disease     a. LHC (08/2012): Lmain: long, 20% stenosis distally LAD: mod calcified and mild dz prox. diff dz proximally 30%, first diagonal mild dz prox and small, Ramus intermediate brach lg bifurcates mid vessel prox. 90-95% stenosis LCx: relatively sml, rise to single marginal and contiine AV groove, 1st marg. diff 60-70% dz stenosis prox RCA: occ. prox. R-L coll, successful stent remus interm branch BMS  . Other and unspecified hyperlipidemia   . Hypertension   . Sinoatrial node dysfunction   . PVD (peripheral vascular disease)     s/p renal artery stent 20-04  . AAA (abdominal aortic aneurysm)     repaired  . Systolic congestive heart failure     a. ICM b.   . Ischemic cardiomyopathy   . Carotid artery occlusion   . Atrial fibrillation   . GERD (gastroesophageal reflux  disease)   . Hypothyroidism   . Automatic implantable cardioverter-defibrillator in situ   . Myocardial infarction   . HCAP (healthcare-associated pneumonia) 2014  . Stroke 2014    left sided affected no residual effects  . Arthritis     "left elbow and shoulder" (09/24/2013)  . Anxiety   . Colon polyp   . Congestive heart failure   . H/O blood clots   . Hyperlipidemia   . Pneumonia     Social History History  Substance Use Topics  . Smoking status: Former Smoker -- 1.00 packs/day for 50 years    Types: Cigarettes    Quit date: 05/17/1993  . Smokeless tobacco: Current User    Types: Chew  . Alcohol Use: No    Family History Family History  Problem Relation Age of Onset  . CAD Father   . Hypertension Father   . Heart disease Father   . CAD Brother   . Heart disease Brother   . Heart attack Brother   . Diabetes Brother   . Lung cancer Sister   . Heart disease Sister   . Hypertension Sister   . Hypertension Mother   . Hypertension Daughter   . Vascular Disease Daughter     Right Carotid  . Colon cancer Son   . Cancer Son     Colon  . Colon polyps Brother     x2    Surgical History Past Surgical History  Procedure Laterality  Date  . Cardiac pacemaker placement  08/01/2009    st jude dual chamber; explanted 09/24/2013  . Colonoscopy    . Esophagogastroduodenoscopy    . Transluminal angioplasty    . Tee without cardioversion N/A 09/23/2012    Procedure: TRANSESOPHAGEAL ECHOCARDIOGRAM (TEE);  Surgeon: Lelon Perla, MD;  Location: [facility] ENDOSCOPY;  Service: Cardiovascular;  Laterality: N/A;  . Cardioversion N/A 09/23/2012    Procedure: CARDIOVERSION;  Surgeon: Lelon Perla, MD;  Location: [facility] ENDOSCOPY;  Service: Cardiovascular;  Laterality: N/A;  . Renal artery stent    . Abdominal aortic aneurysm repair  05/17/1993  . Coronary angioplasty with stent placement  08/2012    "1"  . Endarterectomy Right 11/25/2012    Procedure: ENDARTERECTOMY CAROTID With Dacron Patch  Angioplasty;  Surgeon: Elam Dutch, MD;  Location: [place];  Service: Vascular;  Laterality: Right;  . Carotid endarterectomy Right 11-25-12  . Inguinal hernia repair Bilateral     "one at a time"  . Bi-ventricular implantable cardioverter defibrillator  (crt-d)  09/24/2013  . Cataract extraction w/ intraocular lens  implant, bilateral Bilateral   . Coronary artery bypass graft  05/17/1993    "CABG X4"  . Left and right heart catheterization with coronary angiogram N/A 09/15/2012    Procedure: LEFT AND RIGHT HEART CATHETERIZATION WITH CORONARY ANGIOGRAM;  Surgeon: Peter M Martinique, MD;  Location: [facility] CATH LAB;  Service: Cardiovascular;  Laterality: N/A;  . Percutaneous coronary stent intervention (pci-s)  09/15/2012    Procedure: PERCUTANEOUS CORONARY STENT INTERVENTION (PCI-S);  Surgeon: Peter M Martinique, MD;  Location: [facility] CATH LAB;  Service: Cardiovascular;;  . Right heart catheterization N/A 08/31/2013    Procedure: RIGHT HEART CATH;  Surgeon: Jolaine Artist, MD;  Location: [facility] CATH LAB;  Service: Cardiovascular;  Laterality: N/A;  . Bi-ventricular implantable cardioverter defibrillator N/A 09/24/2013    Procedure: BI-VENTRICULAR IMPLANTABLE CARDIOVERTER DEFIBRILLATOR  (CRT-D);  Surgeon: Evans Lance, MD;  Location: [facility] CATH LAB;  Service: Cardiovascular;  Laterality: N/A;  . Defribrillator      Allergies  Allergen Reactions  . Codeine     "tripped out on it"  . Morphine     "tripped out on it"    Current Outpatient Prescriptions  Medication Sig Dispense Refill  . ALPRAZolam (XANAX) 0.25 MG tablet Take 0.375 mg by mouth at bedtime.    Marland Kitchen amiodarone (PACERONE) 200 MG tablet Take 100 mg by mouth daily.    Marland Kitchen amiodarone (PACERONE) 200 MG tablet TAKE ONE (1) TABLET EACH DAY 90 tablet 1  . apixaban (ELIQUIS) 5 MG TABS tablet Take 1 tablet (5 mg total) by mouth 2 (two) times daily. 60 tablet 6  . atorvastatin (LIPITOR) 20 MG tablet Take 20 mg by mouth daily at 6 PM.     . B Complex-C (SUPER B  COMPLEX) TABS Take 1 tablet by mouth daily.     . calcium carbonate (OS-CAL) 600 MG TABS Take 600 mg by mouth daily with breakfast.     . cetirizine (ZYRTEC) 10 MG tablet Take 10 mg by mouth daily.     . digoxin (LANOXIN) 0.125 MG tablet Take 0.5 tablets (0.0625 mg total) by mouth daily. 90 tablet 0  . furosemide (LASIX) 80 MG tablet Take 40 mg by mouth daily.     Marland Kitchen levothyroxine (SYNTHROID, LEVOTHROID) 112 MCG tablet Take 112 mcg by mouth daily.    . magnesium gluconate (MAGONATE) 500 MG tablet Take 500 mg by mouth daily.     Marland Kitchen  Multiple Vitamin (MULTIVITAMIN WITH MINERALS) TABS Take 1 tablet by mouth daily.    . nitroGLYCERIN (NITROSTAT) 0.4 MG SL tablet Place 1 tablet (0.4 mg total) under the tongue every 5 (five) minutes as needed for chest pain. 25 tablet 3  . Omega-3 Fatty Acids (FISH OIL) 1200 MG CAPS Take 1,200 mg by mouth 2 (two) times daily.    Marland Kitchen omeprazole (PRILOSEC) 20 MG capsule Take 20 mg by mouth daily as needed (indigestion).     . Saw Palmetto, Serenoa repens, (SAW PALMETTO PO) Take 1 capsule by mouth 2 (two) times daily.    . tamsulosin (FLOMAX) 0.4 MG CAPS capsule Take 0.4 mg by mouth daily after supper.     . vitamin C (ASCORBIC ACID) 500 MG tablet Take 500 mg by mouth daily.      Marland Kitchen tiotropium (SPIRIVA) 18 MCG inhalation capsule Place 1 capsule (18 mcg total) into inhaler and inhale daily. (Patient not taking: Reported on 06/17/2014) 30 capsule 6  . [DISCONTINUED] diltiazem (CARDIZEM CD) 120 MG 24 hr capsule      No current facility-administered medications for this visit.    Review of Systems : See HPI for pertinent positives and negatives.  Physical Examination  Filed Vitals:   06/17/14 1151 06/17/14 1154  BP: 141/69 134/70  Pulse: 60 60  Resp:  16  Height:  6' (1.829 m)  Weight:  152 lb 6.4 oz (69.128 kg)  SpO2:  100%   Body mass index is 20.66 kg/(m^2).  General: WDWN male in NAD GAIT: normal Eyes: PERRLA Pulmonary:  Non-labored, CTAB, Negative  Rales,  Negative rhonchi, & Negative wheezing.  Cardiac: regular Rhythm,  Negative detected murmur. Pacemaker/defibrillator subcutaneous left upper chest.  VASCULAR EXAM Carotid Bruits Right Left   Positive Positive    Aorta is not palpable. Radial pulses are 2+ palpable and =.                                                                                                                            LE Pulses Right Left       POPLITEAL  not palpable   nort palpable       POSTERIOR TIBIAL  not palpable   not palpable        DORSALIS PEDIS      ANTERIOR TIBIAL faintly palpable  faintly palpable     Gastrointestinal: soft, nontender, BS WNL, no r/g,  negative palpated masses.  Musculoskeletal: Negative muscle atrophy/wasting. M/S 5/5 throughout, Extremities without ischemic changes.  Neurologic: A&O X 3; Appropriate Affect;  Speech is slightly garbled, but he is also missing many teeth. CN 2-12 intact, Pain and light touch intact in extremities, Motor exam as listed above.   Non-Invasive Vascular Imaging CAROTID DUPLEX 06/17/2014   CEREBROVASCULAR DUPLEX EVALUATION    INDICATION: Follow-up carotid disease     PREVIOUS INTERVENTION(S): Right carotid endarterectomy 11/25/2012    DUPLEX EXAM:     RIGHT  LEFT  Peak  Systolic Velocities (cm/s) End Diastolic Velocities (cm/s) Plaque LOCATION Peak Systolic Velocities (cm/s) End Diastolic Velocities (cm/s) Plaque  96 9  CCA PROXIMAL 93 12   67 10  CCA MID 122 19   105 11  CCA DISTAL 116 17 HT  402 0 HM ECA 148 0   129 19  ICA PROXIMAL 66 17 HT  90 19  ICA MID 99 20   109 26  ICA DISTAL 96 20     NA ICA / CCA Ratio (PSV) .57  Antegrade  Vertebral Flow Retrograde   Brachial Systolic Pressure (mmHg)   Within normal limits  Brachial Artery Waveforms Turbulent     Plaque Morphology:  HM = Homogeneous, HT = Heterogeneous, CP = Calcific Plaque, SP = Smooth Plaque, IP = Irregular Plaque  ADDITIONAL FINDINGS:     IMPRESSION: 1. Widely  patent right carotid endarterectomy without evidence of restenosis or hyperplasia.  2. Evidence of mild (<40%) stenosis of the left internal carotid artery. 3. Right vertebral artery is antegrade, left is retrograde. 4. Left subclavian artery waveform is turbulent with elevated velocities suggestive of proximal stenosis.    Compared to the previous exam:  Left vertebral artery is now retrograde; no other significant change compared to prior exam.       Assessment: Trevor Simpson is a 81 y.o. male who is s/p right carotid endarterectomy on 11/25/2012. This was a symptomatic carotid stenosis with a stroke as manifested by left hemiparesis and slurred speech before the CEA. He denies any symptoms of TIA or stroke since the CEA.  He also is s/p AAA open repair in 1995, also possible bpg of iliac arteries and renal arteries for stenosis. He exercises an hour daily despite an EF of 15%. He does not seem to have claudication symptoms, no tissue loss.  Today's carotid Duplex reveals a widely patent right carotid endarterectomy without evidence of restenosis or hyperplasia.  Evidence of mild (<40%) stenosis of the left internal carotid artery. Right vertebral artery is antegrade, left is retrograde. Left subclavian artery waveform is turbulent with elevated velocities suggestive of proximal stenosis. Left vertebral artery is now retrograde; no other significant change compared to prior exam.   Face to face time with patient was 25 minutes. Over 50% of this time was spent on counseling and coordination of care.     Plan: Follow-up in 1 year with Carotid Duplex.   I discussed in depth with the patient the nature of atherosclerosis, and emphasized the importance of maximal medical management including strict control of blood pressure, blood glucose, and lipid levels, obtaining regular exercise, and continued cessation of smoking.  The patient is aware that without maximal medical management the  underlying atherosclerotic disease process will progress, limiting the benefit of any interventions. The patient was given information about stroke prevention and what symptoms should prompt the patient to seek immediate medical care. Thank you for allowing [place] to participate in this patient's care.  Clemon Chambers, RN, MSN, FNP-C [facility] Office: [phone]  Clinic Physician: Oneida Alar on call  06/17/2014 12:32 PM

## 2014-06-17 NOTE — Patient Instructions (Signed)
Stroke Prevention Some medical conditions and behaviors are associated with an increased chance of having a stroke. You may prevent a stroke by making healthy choices and managing medical conditions. HOW CAN I REDUCE MY RISK OF HAVING A STROKE?   Stay physically active. Get at least 30 minutes of activity on most or all days.  Do not smoke. It may also be helpful to avoid exposure to secondhand smoke.  Limit alcohol use. Moderate alcohol use is considered to be:  No more than 2 drinks per day for men.  No more than 1 drink per day for nonpregnant women.  Eat healthy foods. This involves:  Eating 5 or more servings of fruits and vegetables a day.  Making dietary changes that address high blood pressure (hypertension), high cholesterol, diabetes, or obesity.  Manage your cholesterol levels.  Making food choices that are high in fiber and low in saturated fat, trans fat, and cholesterol may control cholesterol levels.  Take any prescribed medicines to control cholesterol as directed by your health care provider.  Manage your diabetes.  Controlling your carbohydrate and sugar intake is recommended to manage diabetes.  Take any prescribed medicines to control diabetes as directed by your health care provider.  Control your hypertension.  Making food choices that are low in salt (sodium), saturated fat, trans fat, and cholesterol is recommended to manage hypertension.  Take any prescribed medicines to control hypertension as directed by your health care provider.  Maintain a healthy weight.  Reducing calorie intake and making food choices that are low in sodium, saturated fat, trans fat, and cholesterol are recommended to manage weight.  Stop drug abuse.  Avoid taking birth control pills.  Talk to your health care provider about the risks of taking birth control pills if you are over 35 years old, smoke, get migraines, or have ever had a blood clot.  Get evaluated for sleep  disorders (sleep apnea).  Talk to your health care provider about getting a sleep evaluation if you snore a lot or have excessive sleepiness.  Take medicines only as directed by your health care provider.  For some people, aspirin or blood thinners (anticoagulants) are helpful in reducing the risk of forming abnormal blood clots that can lead to stroke. If you have the irregular heart rhythm of atrial fibrillation, you should be on a blood thinner unless there is a good reason you cannot take them.  Understand all your medicine instructions.  Make sure that other conditions (such as anemia or atherosclerosis) are addressed. SEEK IMMEDIATE MEDICAL CARE IF:   You have sudden weakness or numbness of the face, arm, or leg, especially on one side of the body.  Your face or eyelid droops to one side.  You have sudden confusion.  You have trouble speaking (aphasia) or understanding.  You have sudden trouble seeing in one or both eyes.  You have sudden trouble walking.  You have dizziness.  You have a loss of balance or coordination.  You have a sudden, severe headache with no known cause.  You have new chest pain or an irregular heartbeat. Any of these symptoms may represent a serious problem that is an emergency. Do not wait to see if the symptoms will go away. Get medical help at once. Call your local emergency services (911 in [place] Do not drive yourself to the hospital. Document Released: 04/12/2004 Document Revised: 07/20/2013 Document Reviewed: 09/05/2012 [employer] Patient Information 2015 [employer]. This information is not intended to replace advice given   to you by your health care provider. Make sure you discuss any questions you have with your health care provider.  

## 2014-07-06 ENCOUNTER — Telehealth (HOSPITAL_COMMUNITY): Payer: Self-pay | Admitting: *Deleted

## 2014-07-06 NOTE — Telephone Encounter (Signed)
Pt requested samples of eliquis '5mg'$ .  Samples left at front desk with Arbutus Leas.

## 2014-07-08 ENCOUNTER — Other Ambulatory Visit (HOSPITAL_COMMUNITY): Payer: Self-pay | Admitting: *Deleted

## 2014-07-08 DIAGNOSIS — I5022 Chronic systolic (congestive) heart failure: Secondary | ICD-10-CM

## 2014-07-08 MED ORDER — FUROSEMIDE 80 MG PO TABS: 40.0000 mg | ORAL_TABLET | Freq: Every day | ORAL | Status: DC

## 2014-07-13 ENCOUNTER — Ambulatory Visit (INDEPENDENT_AMBULATORY_CARE_PROVIDER_SITE_OTHER): Payer: Medicare HMO | Admitting: *Deleted

## 2014-07-13 ENCOUNTER — Encounter: Payer: Self-pay | Admitting: Internal Medicine

## 2014-07-13 DIAGNOSIS — I495 Sick sinus syndrome: Secondary | ICD-10-CM | POA: Diagnosis not present

## 2014-07-13 LAB — MDC_IDC_ENUM_SESS_TYPE_REMOTE
Battery Remaining Percentage: 81 %
Battery Voltage: 2.98 V
Brady Statistic AP VS Percent: 1 %
Brady Statistic AS VP Percent: 26 %
Brady Statistic AS VS Percent: 1 %
Brady Statistic RA Percent Paced: 73 %
Date Time Interrogation Session: 20160426150603
HighPow Impedance: 70 Ohm
HighPow Impedance: 70 Ohm
Implantable Pulse Generator Serial Number: 7201810
Lead Channel Impedance Value: 480 Ohm
Lead Channel Sensing Intrinsic Amplitude: 3.6 mV
Lead Channel Setting Pacing Amplitude: 2.5 V
Lead Channel Setting Pacing Amplitude: 2.5 V
Lead Channel Setting Pacing Pulse Width: 0.5 ms
Lead Channel Setting Pacing Pulse Width: 0.5 ms
MDC IDC MSMT BATTERY REMAINING LONGEVITY: 43 mo
MDC IDC MSMT LEADCHNL LV IMPEDANCE VALUE: 380 Ohm
MDC IDC MSMT LEADCHNL RA IMPEDANCE VALUE: 380 Ohm
MDC IDC MSMT LEADCHNL RV SENSING INTR AMPL: 12 mV
MDC IDC SET LEADCHNL RA PACING AMPLITUDE: 3.5 V
MDC IDC SET LEADCHNL RV SENSING SENSITIVITY: 0.5 mV
MDC IDC SET ZONE DETECTION INTERVAL: 250 ms
MDC IDC SET ZONE DETECTION INTERVAL: 315 ms
MDC IDC STAT BRADY AP VP PERCENT: 73 %

## 2014-07-13 NOTE — Progress Notes (Signed)
Remote ICD transmission.   

## 2014-07-16 ENCOUNTER — Encounter: Payer: Self-pay | Admitting: Cardiology

## 2014-08-03 ENCOUNTER — Encounter: Payer: Self-pay | Admitting: Cardiology

## 2014-09-22 ENCOUNTER — Other Ambulatory Visit: Payer: Self-pay

## 2014-09-22 ENCOUNTER — Telehealth: Payer: Self-pay

## 2014-09-22 MED ORDER — FUROSEMIDE 80 MG PO TABS: 80.0000 mg | ORAL_TABLET | Freq: Every day | ORAL | Status: DC

## 2014-09-22 NOTE — Telephone Encounter (Signed)
Patient calls today to request refill on his Lasix '80mg'$ . It had been reduced to 1/2 tablet daily, but he said that wasn't quite enough, So he decided to take 3/4 of a tablet. Now is running out. Has appointment with you on 10/11/14. No sob, edema. Weight is stable.

## 2014-09-23 ENCOUNTER — Ambulatory Visit (INDEPENDENT_AMBULATORY_CARE_PROVIDER_SITE_OTHER): Payer: Medicare HMO | Admitting: Internal Medicine

## 2014-09-23 ENCOUNTER — Encounter: Payer: Self-pay | Admitting: Internal Medicine

## 2014-09-23 VITALS — BP 112/58 | HR 71 | Temp 97.5°F | Ht 69.0 in | Wt 162.0 lb

## 2014-09-23 DIAGNOSIS — J329 Chronic sinusitis, unspecified: Secondary | ICD-10-CM

## 2014-09-23 DIAGNOSIS — J431 Panlobular emphysema: Secondary | ICD-10-CM | POA: Diagnosis not present

## 2014-09-23 DIAGNOSIS — R0982 Postnasal drip: Secondary | ICD-10-CM | POA: Insufficient documentation

## 2014-09-23 MED ORDER — FLUTICASONE PROPIONATE 50 MCG/ACT NA SUSP: 2.0000 | Freq: Every day | NASAL | Status: DC

## 2014-09-23 MED ORDER — TIOTROPIUM BROMIDE MONOHYDRATE 18 MCG IN CAPS: 18.0000 ug | ORAL_CAPSULE | Freq: Every day | RESPIRATORY_TRACT | Status: DC

## 2014-09-23 NOTE — Progress Notes (Signed)
Subjective:    Patient ID: Trevor Simpson, male    DOB: [birth year], 81 y.o.   MRN: [id number]  HPI    PCP [name], MD   HPI    OV 02/16/2014  Chief Complaint  Patient presents with  . Follow-up    Pt here after PFT. Pt c/o DOE, mild cough with little mucus production and chest pain in midsternal area- pt stated he took 2 nitro and the pain resolved.    Follow-up hemoptysis late 2014/early 2015 due to pneumonia in setting of anti-coagulation, lung nodules, multifactorial dyspnea  Last seen March 2015. At that time hemoptysis was resolving/resolved. I suggested bronchoscopy because of CT scan showing nodules just to rule out any endobronchial lesion but due to cost issues he deferred and opted for follow-up. His other issue at that time was class II-III dyspnea with exertion relieved by rest. I recommended pulmonary rehabilitation back then.  Since that time he says that his dyspnea is improved. Although it is still there at class II-III levels. It is relieved by rest. He says that he was chronotropic incompetence. Since that time in June 2015 he's had a defibrillator placed. He knows works out at [employer]. There is no associated cough or wheezing. This normal hemoptysis.  He was supposed to have CT scan of the chest at this visit but he has not done that yet for the nodules and hemoptysis. In terms of his pulmonary function tests this was first 2015 this is normal spirometry and lung volumes but DLCO is reduced at 53%  He and his wife continue to state that they still have cost issues and will be judicious about the timing and type of interventions     OV 09/23/2014  Chief Complaint  Patient presents with  . Follow-up    feels good most days; SOB at times; prod cough w/yellow mucus;     Follow-up  - Hemoptysis this was resolved. CT scan of the chest 6 months ago was clean. This is not a problem for this visit  - Emphysema: Last visit I prescribed Spiriva but he just  did not bother to take it. He still continues to have dyspnea on exertion associated with fatigue. This is stable. It is mild to moderate in severity. He does silver sneakers at [facility]. He does not want to attend pulmonary rehabilitation due to expensive co-pay. There are no new issues. He is willing to restart Spiriva to help with dyspnea but he does have concerns about urinary retention. He is noted to be on Flomax  - New issue: Reporting chronic nasal stuffiness for the last 1 year. It is moderate in severity. He is unable to lie flat for the last 6 months. He's having to sleep in a recliner. He says Mucinex nasal spray helps. There is no specific aggravating factor other than having to lie flat. It is slowly getting worse. He has not had a sinus CT and doesn't want to have it right now. He is not interested in surgical options. He is uninterested in simple medical options. There is no fever or drainage    Current outpatient prescriptions:  .  ALPRAZolam (XANAX) 0.25 MG tablet, Take 0.375 mg by mouth at bedtime., Disp: , Rfl:  .  amiodarone (PACERONE) 200 MG tablet, Take 100 mg by mouth daily., Disp: , Rfl:  .  apixaban (ELIQUIS) 5 MG TABS tablet, Take 1 tablet (5 mg total) by mouth 2 (two) times daily., Disp: 60 tablet, Rfl:  6 .  atorvastatin (LIPITOR) 20 MG tablet, Take 20 mg by mouth daily at 6 PM. , Disp: , Rfl:  .  B Complex-C (SUPER B COMPLEX) TABS, Take 1 tablet by mouth daily. , Disp: , Rfl:  .  calcium carbonate (OS-CAL) 600 MG TABS, Take 600 mg by mouth daily with breakfast. , Disp: , Rfl:  .  cetirizine (ZYRTEC) 10 MG tablet, Take 10 mg by mouth daily. , Disp: , Rfl:  .  digoxin (LANOXIN) 0.125 MG tablet, Take 0.5 tablets (0.0625 mg total) by mouth daily., Disp: 90 tablet, Rfl: 0 .  furosemide (LASIX) 80 MG tablet, Take 1 tablet (80 mg total) by mouth daily., Disp: 90 tablet, Rfl: 3 .  levothyroxine (SYNTHROID, LEVOTHROID) 112 MCG tablet, Take 112 mcg by mouth daily., Disp: , Rfl:  .   magnesium gluconate (MAGONATE) 500 MG tablet, Take 500 mg by mouth daily. , Disp: , Rfl:  .  Multiple Vitamin (MULTIVITAMIN WITH MINERALS) TABS, Take 1 tablet by mouth daily., Disp: , Rfl:  .  nitroGLYCERIN (NITROSTAT) 0.4 MG SL tablet, Place 1 tablet (0.4 mg total) under the tongue every 5 (five) minutes as needed for chest pain., Disp: 25 tablet, Rfl: 3 .  Omega-3 Fatty Acids (FISH OIL) 1200 MG CAPS, Take 1,200 mg by mouth 2 (two) times daily., Disp: , Rfl:  .  omeprazole (PRILOSEC) 20 MG capsule, Take 20 mg by mouth daily as needed (indigestion). , Disp: , Rfl:  .  Saw Palmetto, Serenoa repens, (SAW PALMETTO PO), Take 1 capsule by mouth 2 (two) times daily., Disp: , Rfl:  .  tamsulosin (FLOMAX) 0.4 MG CAPS capsule, Take 0.4 mg by mouth daily after supper. , Disp: , Rfl:  .  tiotropium (SPIRIVA) 18 MCG inhalation capsule, Place 1 capsule (18 mcg total) into inhaler and inhale daily., Disp: 30 capsule, Rfl: 6 .  vitamin C (ASCORBIC ACID) 500 MG tablet, Take 500 mg by mouth daily.  , Disp: , Rfl:  .  [DISCONTINUED] diltiazem (CARDIZEM CD) 120 MG 24 hr capsule, , Disp: , Rfl:    Review of Systems  Constitutional: Negative for fever and unexpected weight change.  HENT: Negative for congestion, dental problem, ear pain, nosebleeds, postnasal drip, rhinorrhea, sinus pressure, sneezing, sore throat and trouble swallowing.   Eyes: Negative for redness and itching.  Respiratory: Positive for cough and shortness of breath. Negative for chest tightness and wheezing.   Cardiovascular: Negative for palpitations and leg swelling.  Gastrointestinal: Negative for nausea and vomiting.  Genitourinary: Negative for dysuria.  Musculoskeletal: Negative for joint swelling.  Skin: Negative for rash.  Neurological: Negative for headaches.  Hematological: Does not bruise/bleed easily.  Psychiatric/Behavioral: Negative for dysphoric mood. The patient is not nervous/anxious.        Objective:   Physical Exam    Constitutional: He is oriented to person, place, and time. He appears well-developed and well-nourished. No distress.  Body mass index is 23.91 kg/(m^2). Frail male  HENT:  Head: Normocephalic and atraumatic.  Right Ear: External ear normal.  Left Ear: External ear normal.  Mouth/Throat: Oropharynx is clear and moist. No oropharyngeal exudate.  Mild postnasal drainage  Eyes: Conjunctivae and EOM are normal. Pupils are equal, round, and reactive to light. Right eye exhibits no discharge. Left eye exhibits no discharge. No scleral icterus.  Neck: Normal range of motion. Neck supple. No JVD present. No tracheal deviation present. No thyromegaly present.  Cardiovascular: Normal rate, regular rhythm and intact distal pulses.  Exam  reveals no gallop and no friction rub.   No murmur heard. Pulmonary/Chest: Effort normal and breath sounds normal. No respiratory distress. He has no wheezes. He has no rales. He exhibits no tenderness.  Abdominal: Soft. Bowel sounds are normal. He exhibits no distension and no mass. There is no tenderness. There is no rebound and no guarding.  Musculoskeletal: Normal range of motion. He exhibits no edema or tenderness.  Lymphadenopathy:    He has no cervical adenopathy.  Neurological: He is alert and oriented to person, place, and time. He has normal reflexes. No cranial nerve deficit. Coordination normal.  Skin: Skin is warm and dry. No rash noted. He is not diaphoretic. No erythema. No pallor.  Scattered skin bruising  Psychiatric: He has a normal mood and affect. His behavior is normal. Judgment and thought content normal.  Nursing note and vitals reviewed.   Filed Vitals:   09/23/14 1426  BP: 112/58  Pulse: 71  Temp: 97.5 F (36.4 C)  TempSrc: Oral  Height: '5\' 9"'$  (1.753 m)  Weight: 162 lb (73.483 kg)  SpO2: 97%        Assessment & Plan:  Panlobular emphysema  Post-nasal drainage  #EMphysema - continue exercises at [facility] ; rehab too expensive   - restart spiriva 1 puff dialy - if no help or urinary retention stop it (take samples)  - flu shot in fall  #Nasal stuffiness - new problems  - Continue Mucinex nasal spray -  START generic fluticasone inhaler 2 squirts each nostril daily  START netti pot at night - If this does not improve, then we will  consider CT sinus  #FOllowup  6 months to see me or with NP Tamm  Dr. Brand Males, M.D., [facility].C.P Pulmonary and Critical Care Medicine Staff Physician [facility] Pager: [phone], If no answer or between  15:00h - 7:00h: call [phone]  09/23/2014 2:54 PM

## 2014-09-23 NOTE — Patient Instructions (Addendum)
ICD-9-CM ICD-10-CM   1. Panlobular emphysema 492.8 J43.1   2. Post-nasal drainage 473.9 J32.9   #EMphusema - continue exercises at [facility] ; rehab too expensive  - restart spiriva 1 puff dialy - if no help or urinary retention stop it (take samples)  - flu shot in fall  #Nasal stuffiness - new problems  - Continue Mucinex nasal spray -  START generic fluticasone inhaler 2 squirts each nostril daily  START netti pot at night - If this does not improve, then we will how to consider CT sinus  #FOllowup  6 months to see me or with NP Tammy

## 2014-09-27 ENCOUNTER — Ambulatory Visit: Payer: Medicare HMO | Admitting: Internal Medicine

## 2014-09-27 ENCOUNTER — Encounter: Payer: Self-pay | Admitting: *Deleted

## 2014-09-30 ENCOUNTER — Other Ambulatory Visit (HOSPITAL_COMMUNITY): Payer: Self-pay

## 2014-09-30 ENCOUNTER — Telehealth: Payer: Self-pay | Admitting: Internal Medicine

## 2014-09-30 MED ORDER — APIXABAN 5 MG PO TABS: 5.0000 mg | ORAL_TABLET | Freq: Two times a day (BID) | ORAL | Status: DC

## 2014-09-30 NOTE — Telephone Encounter (Signed)
Spoke with pt. Advised him that we do not samples at this time. Nothing further was needed.

## 2014-10-05 ENCOUNTER — Encounter (HOSPITAL_COMMUNITY): Payer: Self-pay

## 2014-10-05 ENCOUNTER — Ambulatory Visit (HOSPITAL_COMMUNITY)
Admission: RE | Admit: 2014-10-05 | Discharge: 2014-10-05 | Disposition: A | Discharge status: Home / Self Care | Payer: Medicare HMO | Source: Ambulatory Visit | Attending: Internal Medicine | Admitting: Internal Medicine

## 2014-10-05 VITALS — BP 120/70 | HR 62 | Wt 161.2 lb

## 2014-10-05 DIAGNOSIS — I5021 Acute systolic (congestive) heart failure: Secondary | ICD-10-CM | POA: Diagnosis not present

## 2014-10-05 DIAGNOSIS — Z7901 Long term (current) use of anticoagulants: Secondary | ICD-10-CM | POA: Diagnosis not present

## 2014-10-05 DIAGNOSIS — I255 Ischemic cardiomyopathy: Secondary | ICD-10-CM | POA: Insufficient documentation

## 2014-10-05 DIAGNOSIS — J449 Chronic obstructive pulmonary disease, unspecified: Secondary | ICD-10-CM | POA: Diagnosis not present

## 2014-10-05 DIAGNOSIS — I5022 Chronic systolic (congestive) heart failure: Secondary | ICD-10-CM | POA: Diagnosis present

## 2014-10-05 DIAGNOSIS — I2510 Atherosclerotic heart disease of native coronary artery without angina pectoris: Secondary | ICD-10-CM | POA: Diagnosis not present

## 2014-10-05 DIAGNOSIS — Z79899 Other long term (current) drug therapy: Secondary | ICD-10-CM | POA: Diagnosis not present

## 2014-10-05 DIAGNOSIS — I480 Paroxysmal atrial fibrillation: Secondary | ICD-10-CM | POA: Diagnosis not present

## 2014-10-05 DIAGNOSIS — Z8673 Personal history of transient ischemic attack (TIA), and cerebral infarction without residual deficits: Secondary | ICD-10-CM | POA: Insufficient documentation

## 2014-10-05 LAB — BASIC METABOLIC PANEL
ANION GAP: 8 (ref 5–15)
BUN: 25 mg/dL — ABNORMAL HIGH (ref 6–20)
CO2: 35 mmol/L — ABNORMAL HIGH (ref 22–32)
CREATININE: 1.72 mg/dL — AB (ref 0.61–1.24)
Calcium: 9.9 mg/dL (ref 8.9–10.3)
Chloride: 94 mmol/L — ABNORMAL LOW (ref 101–111)
GFR calc Af Amer: 42 mL/min — ABNORMAL LOW (ref >60)
GFR, EST NON AFRICAN AMERICAN: 36 mL/min — AB (ref >60)
Glucose, Bld: 77 mg/dL (ref 65–99)
POTASSIUM: 4.3 mmol/L (ref 3.5–5.1)
Sodium: 137 mmol/L (ref 135–145)

## 2014-10-05 LAB — DIGOXIN LEVEL: DIGOXIN LVL: 0.7 ng/mL — AB (ref 0.8–2.0)

## 2014-10-05 MED ORDER — CARVEDILOL 3.125 MG PO TABS
3.1250 mg | ORAL_TABLET | Freq: Two times a day (BID) | ORAL | Status: DC
Start: 2014-10-05 — End: 2014-10-05

## 2014-10-05 MED ORDER — CARVEDILOL 3.125 MG PO TABS: 3.1250 mg | ORAL_TABLET | Freq: Every day | ORAL | Status: DC

## 2014-10-05 NOTE — Progress Notes (Signed)
Patient ID: Trevor Simpson, male   DOB: 10-16-1933, 81 y.o.   MRN: [id number]  PCP: Dr Maudie Mercury Pulmonology: Dr Chase Caller Cardiology: Dr Lovena Le  HPI: Mr Trevor Simpson is a 81 y.o. year old with history of  COPD, symptomatic bradycardia, PAF, HTN, PAD, CVA, chronic systolic heart failure and ischemic cardiomyopathy s/p CRT-D on 09/26/13  LHC 08/2012  Left mainstem: The left main is long with 20% stenosis distally.  Left anterior descending (LAD): The left anterior descending artery is moderately calcified in the proximal vessel. There is diffuse disease in the proximal vessel up to 30%. The first diagonal is relatively small and has mild disease proximally. The ramus intermediate branch is a large branch that bifurcates in the mid vessel. The proximal vessel has a 90-95% stenosis.  Left circumflex (LCx): The left circumflex is relatively small. It gives rise to a single marginal branch and then continues in the AV groove. The first marginal branch has diffuse 60-70% stenosis proximally.  Right coronary artery (RCA): The right coronary is occluded proximally. There are right to right and left to right collaterals. Severe two-vessel obstructive coronary disease. Chronic total occlusion of the right coronary Successful stenting of the ramus intermediate branch with a bare-metal stent  He was treated for RUL pneumonia 12/14. He developed hemoptysis and his PCP stopped Xarelto and placed him on levaquin.  At that time he was referred to pulmonary. [name] Stopped levaquin and started cipro and clindamycin and coumadin. F/u CXR has shown persistent RUL opacity and there is a question if this might be asymmetric edema.  However, CT showed scarring pattern likely from prior PNA.  Admitted in 6/15 with low-output. RHC as below. Started on milrinone. In July 2015 underwent CRT-D upgrade Mean RA 11 PA 53/24  Mean PCWP 22 CI 1.5  Echo (6/15): EF 15%, moderate central MR, mild to moderately decreased RV systolic  function.   Milrinone stopped in September 2015 due to patient preference, despite borderline co-ox.  He presents today for HF follow up.  Says his SBP dropped into 80s on flomax, so was told to switch to every other day. This continued on decreased dose with symptoms of dizziness, so he has stopped it all together.  Overall doing [place]. Except on Friday he felt like he had no energy at all.  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 On a good day can walk the grocery store with minimal stopping. Says he has more good days than bad, but is always tired.  Has a great appetite. Weight 154 this morning, range of 154-159.  Systolic BP 497-026 off of flomax. No longer having dizziness. No swelling or PND. Sleeps sitting up, but says it's because of his nose. Has difficulty with inclines and DOE. Maintaining NSR on amio. Has appt with Dr. Lovena Le next week.  Labs:  7/14 AST 27 ALT 21  11/14 K 3.8 Creatinine  1.41  12/14 Pro BNP 1100 4/14: K 3.9, creatinine 1.4, BUN 22 6/15: K 4.1, creatinine 1.2 7/15: K 3.7, creatinine 1.36, HCT 43.7, co-ox 66.5% 8/15: digoxin 1.2, co-ox 55%, K 4.0, creatinine 1.36 9/15: co-ox 56.8% 11/15 K 3.8, creatinine 1.25  SH: Lives at home with wife  FH: Father, Brother - CAD HTN, DM        Sister - lung cancer    ROS: All systems reviewed and negative except as per HPI.   Current Outpatient Prescriptions  Medication Sig Dispense Refill  .  ALPRAZolam (XANAX) 0.25 MG tablet Take 0.375 mg by mouth at bedtime.    Marland Kitchen amiodarone (PACERONE) 200 MG tablet Take 100 mg by mouth daily.    Marland Kitchen apixaban (ELIQUIS) 5 MG TABS tablet Take 1 tablet (5 mg total) by mouth 2 (two) times daily. 60 tablet 6  . atorvastatin (LIPITOR) 20 MG tablet Take 20 mg by mouth daily at 6 PM.     . B Complex-C (SUPER B COMPLEX) TABS Take 1 tablet by mouth daily.     . calcium carbonate (OS-CAL) 600 MG TABS Take 600 mg by mouth daily with breakfast.     . cetirizine (ZYRTEC) 10 MG  tablet Take 10 mg by mouth daily.     . digoxin (LANOXIN) 0.125 MG tablet Take 0.5 tablets (0.0625 mg total) by mouth daily. 90 tablet 0  . furosemide (LASIX) 80 MG tablet Take 1 tablet (80 mg total) by mouth daily. 90 tablet 3  . levothyroxine (SYNTHROID, LEVOTHROID) 112 MCG tablet Take 112 mcg by mouth daily.    . magnesium gluconate (MAGONATE) 500 MG tablet Take 500 mg by mouth daily.     . Multiple Vitamin (MULTIVITAMIN WITH MINERALS) TABS Take 1 tablet by mouth daily.    . nitroGLYCERIN (NITROSTAT) 0.4 MG SL tablet Place 1 tablet (0.4 mg total) under the tongue every 5 (five) minutes as needed for chest pain. 25 tablet 3  . Omega-3 Fatty Acids (FISH OIL) 1200 MG CAPS Take 1,200 mg by mouth 2 (two) times daily.    Marland Kitchen omeprazole (PRILOSEC) 20 MG capsule Take 20 mg by mouth daily as needed (indigestion).     . Saw Palmetto, Serenoa repens, (SAW PALMETTO PO) Take 1 capsule by mouth 2 (two) times daily.    Marland Kitchen tiotropium (SPIRIVA) 18 MCG inhalation capsule Place 1 capsule (18 mcg total) into inhaler and inhale daily. 30 capsule 6  . vitamin C (ASCORBIC ACID) 500 MG tablet Take 500 mg by mouth daily.      . fluticasone (FLONASE) 50 MCG/ACT nasal spray Place 2 sprays into both nostrils daily. (Patient not taking: Reported on 10/05/2014) 16 g 2  . tamsulosin (FLOMAX) 0.4 MG CAPS capsule Take 0.4 mg by mouth daily after supper.     . [DISCONTINUED] diltiazem (CARDIZEM CD) 120 MG 24 hr capsule      No current facility-administered medications for this encounter.     Allergies  Allergen Reactions  . Codeine     "tripped out on it"  . Morphine     "tripped out on it"    Filed Vitals:   10/05/14 1120  BP: 120/70  Pulse: 62  Weight: 161 lb 4 oz (73.143 kg)  SpO2: 97%    PHYSICAL EXAM: General:  NAD. No respiratory difficulty. Family present. HEENT: normal Neck: supple. JVP flat; Carotids 2+ bilat; no bruits. No lymphadenopathy or thryomegaly appreciated. R neck scar Cor: Distant heart  sounds. Regular rate & rhythm.  2/6 HSM apex.  2/6 TR Lungs: CTA Abdomen: soft, nontender, nondistended. No hepatosplenomegaly. No bruits or masses. Good bowel sounds. Extremities: no cyanosis, clubbing, rash, no edema Neuro: alert & oriented x 3, cranial nerves grossly intact. moves all 4 extremities w/o difficulty. Affect pleasant.   ASSESSMENT & PLAN: 1. Chronic Systolic Heart Failure: Ischemic cardiomyopathy s/p CRT-D; Echo 6/15 with EF 15%, moderate MR, mild to moderately decreased RV systolic function.  RHC in 6/15 showed CI 1.5 so patient was started on milrinone.  Milrinone stopped in 9/15. NYHA class IIIa  symptoms. Weights stable. Euvolemic.  - BPs improved and pt symptomatically better off of flomax.  - After CRT, tolerating milrinone discontinuation  - Taking Lasix 80 mg (taking 3/4 of a pill)  - Add Coreg 3.125 qhs only. - Digoxin 0.'125mg'$ . Check labs today.  - Does not tolerate addition of losartan with hypotensive. And hx of low output makes adding coreg difficult. No ivabradine as HR already under 70.  - Reinforced the need and importance of daily weights, a low sodium diet, and fluid restriction (less than 2 L a day). Instructed to call the HF clinic if weight increases more than 3 lbs overnight or 5 lbs in a week.  - Recheck Echo at next visit. 2. PAF: S/P DC-CV 09/2012.  No bleeding episodes on eliquis; doing much better on low-dose amiodarone per Dr. Lovena Le.  - Would benefit from ICD optimization. -last TSH 0.748 and AST 32/ALT 19  (7/15) -Continue Eliquis 5 bid. 3. CAD: Extensive disease burden. No current signs/sx of ischemia. Continue statin. Lipids per PCP.  No aspirin given stable CAD and use of warfarin.  Sees [facility] next week. Optimization of ICD would be helpful per Dr Haroldine Laws. Labs today.   Shirley Friar, PA-C 10/05/2014   Patient seen and examined with Oda Kilts, PA-C. We discussed all aspects of the encounter. I agree with the assessment and plan  as stated above.   Overall stable NYHA III. Volume status ok. Unable to titrate meds due to hypotension. QRS still broad despite CRT. Will ask EP to try and optimize at visit next week.   Bensimhon, Daniel,MD 11:38 PM

## 2014-10-05 NOTE — Patient Instructions (Addendum)
START COREG 3.125 (1 tablet) at bedtime.  LABS today (bmet dig)  FOLLOW UP in 3 months with an ECHO.

## 2014-10-07 ENCOUNTER — Telehealth (HOSPITAL_COMMUNITY): Payer: Self-pay | Admitting: *Deleted

## 2014-10-07 DIAGNOSIS — I5022 Chronic systolic (congestive) heart failure: Secondary | ICD-10-CM

## 2014-10-07 NOTE — Telephone Encounter (Signed)
Notes Recorded by Scarlette Calico, RN on 10/07/2014 at 4:09 PM Pt aware and agreeable, he is sch to see Dr Susy Manor 7/25 order placed for bmet to be done at that time

## 2014-10-07 NOTE — Telephone Encounter (Signed)
-----   Message from Jolaine Artist, MD sent at 10/07/2014 12:07 PM EDT ----- Creatinine up slightly.Recheck 1 week.

## 2014-10-11 ENCOUNTER — Other Ambulatory Visit: Payer: Self-pay

## 2014-10-11 ENCOUNTER — Other Ambulatory Visit (INDEPENDENT_AMBULATORY_CARE_PROVIDER_SITE_OTHER): Payer: Medicare HMO | Admitting: *Deleted

## 2014-10-11 ENCOUNTER — Encounter: Payer: Self-pay | Admitting: Internal Medicine

## 2014-10-11 ENCOUNTER — Ambulatory Visit (INDEPENDENT_AMBULATORY_CARE_PROVIDER_SITE_OTHER): Payer: Medicare HMO | Admitting: Internal Medicine

## 2014-10-11 ENCOUNTER — Ambulatory Visit (HOSPITAL_COMMUNITY): Payer: Medicare HMO | Attending: Cardiology

## 2014-10-11 VITALS — BP 120/62 | HR 70 | Ht 69.0 in | Wt 161.8 lb

## 2014-10-11 DIAGNOSIS — Z95810 Presence of automatic (implantable) cardiac defibrillator: Secondary | ICD-10-CM

## 2014-10-11 DIAGNOSIS — I340 Nonrheumatic mitral (valve) insufficiency: Secondary | ICD-10-CM | POA: Diagnosis not present

## 2014-10-11 DIAGNOSIS — I351 Nonrheumatic aortic (valve) insufficiency: Secondary | ICD-10-CM | POA: Diagnosis not present

## 2014-10-11 DIAGNOSIS — I517 Cardiomegaly: Secondary | ICD-10-CM | POA: Insufficient documentation

## 2014-10-11 DIAGNOSIS — I358 Other nonrheumatic aortic valve disorders: Secondary | ICD-10-CM | POA: Diagnosis not present

## 2014-10-11 DIAGNOSIS — I5021 Acute systolic (congestive) heart failure: Secondary | ICD-10-CM | POA: Diagnosis not present

## 2014-10-11 DIAGNOSIS — I5022 Chronic systolic (congestive) heart failure: Secondary | ICD-10-CM

## 2014-10-11 DIAGNOSIS — I255 Ischemic cardiomyopathy: Secondary | ICD-10-CM

## 2014-10-11 DIAGNOSIS — I10 Essential (primary) hypertension: Secondary | ICD-10-CM

## 2014-10-11 DIAGNOSIS — I509 Heart failure, unspecified: Secondary | ICD-10-CM | POA: Diagnosis present

## 2014-10-11 DIAGNOSIS — I480 Paroxysmal atrial fibrillation: Secondary | ICD-10-CM

## 2014-10-11 DIAGNOSIS — I77819 Aortic ectasia, unspecified site: Secondary | ICD-10-CM | POA: Insufficient documentation

## 2014-10-11 DIAGNOSIS — I499 Cardiac arrhythmia, unspecified: Secondary | ICD-10-CM

## 2014-10-11 LAB — CUP PACEART INCLINIC DEVICE CHECK
Brady Statistic RA Percent Paced: 72 %
Date Time Interrogation Session: 20160725121240
Lead Channel Pacing Threshold Amplitude: 1.375 V
Lead Channel Pacing Threshold Amplitude: 1.375 V
Lead Channel Pacing Threshold Pulse Width: 0.5 ms
Lead Channel Pacing Threshold Pulse Width: 0.9 ms
Lead Channel Sensing Intrinsic Amplitude: 11.8 mV
Lead Channel Sensing Intrinsic Amplitude: 3.8 mV
MDC IDC MSMT LEADCHNL RV PACING THRESHOLD AMPLITUDE: 0.875 V
MDC IDC MSMT LEADCHNL RV PACING THRESHOLD PULSEWIDTH: 0.5 ms
MDC IDC STAT BRADY RV PERCENT PACED: 98 %
Pulse Gen Serial Number: 7201810

## 2014-10-11 LAB — BASIC METABOLIC PANEL
BUN: 31 mg/dL — AB (ref 6–23)
CALCIUM: 9.6 mg/dL (ref 8.4–10.5)
CO2: 36 mEq/L — ABNORMAL HIGH (ref 19–32)
Chloride: 95 mEq/L — ABNORMAL LOW (ref 96–112)
Creatinine, Ser: 1.53 mg/dL — ABNORMAL HIGH (ref 0.40–1.50)
GFR: 46.86 mL/min — AB (ref >60.00)
GLUCOSE: 81 mg/dL (ref 70–99)
Potassium: 3.5 mEq/L (ref 3.5–5.1)
Sodium: 138 mEq/L (ref 135–145)

## 2014-10-11 NOTE — Progress Notes (Signed)
HPI Mr. Dillinger returns today for followup. He is a very pleasant 81 year old man with a near end-stage ischemic cardiomyopathy, left branch block, paroxysmal atrial fibrillation, who underwent biventricular ICD implantation approximately 12 months ago. While he still remains weak, his symptoms have improved. He no longer requires milrinone.  He denies anginal symptoms.  No peripheral edema. No ICD shock. His weight is actually down and he appears dry.  Allergies  Allergen Reactions  . Codeine     "tripped out on it"  . Morphine     "tripped out on it"     Current Outpatient Prescriptions  Medication Sig Dispense Refill  . ALPRAZolam (XANAX) 0.25 MG tablet Take 0.375 mg by mouth at bedtime.    Marland Kitchen amiodarone (PACERONE) 200 MG tablet Take 100 mg by mouth daily.    Marland Kitchen apixaban (ELIQUIS) 5 MG TABS tablet Take 1 tablet (5 mg total) by mouth 2 (two) times daily. 60 tablet 6  . atorvastatin (LIPITOR) 20 MG tablet Take 20 mg by mouth daily at 6 PM.     . B Complex-C (SUPER B COMPLEX) TABS Take 1 tablet by mouth daily.     . calcium carbonate (OS-CAL) 600 MG TABS Take 600 mg by mouth daily with breakfast.     . carvedilol (COREG) 3.125 MG tablet Take 1 tablet (3.125 mg total) by mouth at bedtime. 30 tablet 3  . cetirizine (ZYRTEC) 10 MG tablet Take 10 mg by mouth daily.     . digoxin (LANOXIN) 0.125 MG tablet Take 0.5 tablets (0.0625 mg total) by mouth daily. 90 tablet 0  . furosemide (LASIX) 80 MG tablet Take 1 tablet (80 mg total) by mouth daily. 90 tablet 3  . levothyroxine (SYNTHROID, LEVOTHROID) 112 MCG tablet Take 112 mcg by mouth daily.    . magnesium gluconate (MAGONATE) 500 MG tablet Take 500 mg by mouth daily.     . Multiple Vitamin (MULTIVITAMIN WITH MINERALS) TABS Take 1 tablet by mouth daily.    . nitroGLYCERIN (NITROSTAT) 0.4 MG SL tablet Place 1 tablet (0.4 mg total) under the tongue every 5 (five) minutes as needed for chest pain. 25 tablet 3  . Omega-3 Fatty Acids (FISH OIL)  1200 MG CAPS Take 1,200 mg by mouth 2 (two) times daily.    Marland Kitchen omeprazole (PRILOSEC) 20 MG capsule Take 20 mg by mouth daily as needed (indigestion).     . Saw Palmetto, Serenoa repens, (SAW PALMETTO PO) Take 1 capsule by mouth 2 (two) times daily.    . tamsulosin (FLOMAX) 0.4 MG CAPS capsule Take 0.4 mg by mouth daily after supper.     . tiotropium (SPIRIVA) 18 MCG inhalation capsule Place 1 capsule (18 mcg total) into inhaler and inhale daily. 30 capsule 6  . vitamin C (ASCORBIC ACID) 500 MG tablet Take 500 mg by mouth daily.      . [DISCONTINUED] diltiazem (CARDIZEM CD) 120 MG 24 hr capsule      No current facility-administered medications for this visit.     Past Medical History  Diagnosis Date  . Coronary artery disease     a. LHC (08/2012): Lmain: long, 20% stenosis distally LAD: mod calcified and mild dz prox. diff dz proximally 30%, first diagonal mild dz prox and small, Ramus intermediate brach lg bifurcates mid vessel prox. 90-95% stenosis LCx: relatively sml, rise to single marginal and contiine AV groove, 1st marg. diff 60-70% dz stenosis prox RCA: occ. prox. R-L coll, successful stent remus interm  branch BMS  . Other and unspecified hyperlipidemia   . Hypertension   . Sinoatrial node dysfunction   . PVD (peripheral vascular disease)     s/p renal artery stent 20-04  . AAA (abdominal aortic aneurysm)     repaired  . Systolic congestive heart failure     a. ICM b.   . Ischemic cardiomyopathy   . Carotid artery occlusion   . Atrial fibrillation   . GERD (gastroesophageal reflux disease)   . Hypothyroidism   . Automatic implantable cardioverter-defibrillator in situ   . Myocardial infarction   . HCAP (healthcare-associated pneumonia) 2014  . Stroke 2014    left sided affected no residual effects  . Arthritis     "left elbow and shoulder" (09/24/2013)  . Anxiety   . Colon polyp   . Congestive heart failure   . H/O blood clots   . Hyperlipidemia   . Pneumonia      ROS:   All systems reviewed and negative except as noted in the HPI.   Past Surgical History  Procedure Laterality Date  . Cardiac pacemaker placement  08/01/2009    st jude dual chamber; explanted 09/24/2013  . Colonoscopy    . Esophagogastroduodenoscopy    . Transluminal angioplasty    . Tee without cardioversion N/A 09/23/2012    Procedure: TRANSESOPHAGEAL ECHOCARDIOGRAM (TEE);  Surgeon: Lelon Perla, MD;  Location: [facility] ENDOSCOPY;  Service: Cardiovascular;  Laterality: N/A;  . Cardioversion N/A 09/23/2012    Procedure: CARDIOVERSION;  Surgeon: Lelon Perla, MD;  Location: [facility] ENDOSCOPY;  Service: Cardiovascular;  Laterality: N/A;  . Renal artery stent    . Abdominal aortic aneurysm repair  05/17/1993  . Coronary angioplasty with stent placement  08/2012    "1"  . Endarterectomy Right 11/25/2012    Procedure: ENDARTERECTOMY CAROTID With Dacron Patch Angioplasty;  Surgeon: Elam Dutch, MD;  Location: [place];  Service: Vascular;  Laterality: Right;  . Carotid endarterectomy Right 11-25-12  . Inguinal hernia repair Bilateral     "one at a time"  . Bi-ventricular implantable cardioverter defibrillator  (crt-d)  09/24/2013  . Cataract extraction w/ intraocular lens  implant, bilateral Bilateral   . Coronary artery bypass graft  05/17/1993    "CABG X4"  . Left and right heart catheterization with coronary angiogram N/A 09/15/2012    Procedure: LEFT AND RIGHT HEART CATHETERIZATION WITH CORONARY ANGIOGRAM;  Surgeon: Peter M Martinique, MD;  Location: [facility] CATH LAB;  Service: Cardiovascular;  Laterality: N/A;  . Percutaneous coronary stent intervention (pci-s)  09/15/2012    Procedure: PERCUTANEOUS CORONARY STENT INTERVENTION (PCI-S);  Surgeon: Peter M Martinique, MD;  Location: [facility] CATH LAB;  Service: Cardiovascular;;  . Right heart catheterization N/A 08/31/2013    Procedure: RIGHT HEART CATH;  Surgeon: Jolaine Artist, MD;  Location: [facility] CATH LAB;  Service: Cardiovascular;  Laterality: N/A;  .  Bi-ventricular implantable cardioverter defibrillator N/A 09/24/2013    Procedure: BI-VENTRICULAR IMPLANTABLE CARDIOVERTER DEFIBRILLATOR  (CRT-D);  Surgeon: Evans Lance, MD;  Location: [facility] CATH LAB;  Service: Cardiovascular;  Laterality: N/A;  . Defribrillator       Family History  Problem Relation Age of Onset  . CAD Father   . Hypertension Father   . Heart disease Father   . CAD Brother   . Heart disease Brother   . Heart attack Brother   . Diabetes Brother   . Lung cancer Sister   . Heart disease Sister   . Hypertension Sister   .  Hypertension Mother   . Hypertension Daughter   . Vascular Disease Daughter     Right Carotid  . Colon cancer Son   . Cancer Son     Colon  . Colon polyps Brother     x2     History   Social History  . Marital Status: Married    Spouse Name: N/A  . Number of Children: 4  . Years of Education: N/A   Occupational History  . Retired    Social History Main Topics  . Smoking status: Former Smoker -- 1.00 packs/day for 50 years    Types: Cigarettes    Quit date: 05/17/1993  . Smokeless tobacco: Current User    Types: Chew  . Alcohol Use: No  . Drug Use: No  . Sexual Activity: Not Currently   Other Topics Concern  . Not on file   Social History Narrative     BP 120/62 mmHg  Pulse 70  Ht '5\' 9"'$  (1.753 m)  Wt 161 lb 12.8 oz (73.392 kg)  BMI 23.88 kg/m2  SpO2 99%  Physical Exam:  Well appearing 81 year old man, NAD HEENT: Unremarkable Neck:  6 cm JVD, no thyromegally Back:  No CVA tenderness Lungs:  Clear with no wheezes, rales, or rhonchi. HEART:  Regular rate rhythm, no murmurs, no rubs, no clicks Abd:  soft, positive bowel sounds, no organomegally, no rebound, no guarding Ext:  2 plus pulses, no edema, no cyanosis, no clubbing Skin:  No rashes no nodules Neuro:  CN II through XII intact, motor grossly intact   DEVICE  Normal device function.  See PaceArt for details.   Assess/Plan:

## 2014-10-11 NOTE — Assessment & Plan Note (Signed)
His blood pressure is low on medical therapy.

## 2014-10-11 NOTE — Patient Instructions (Signed)
Medication Instructions:  Your physician recommends that you continue on your current medications as directed. Please refer to the Current Medication list given to you today.   Labwork: NONE  Testing/Procedures: NONE  Follow-Up: Your physician wants you to follow-up in: 6 months with Dr. Lovena Le. You will receive a reminder letter in the mail two months in advance. If you don't receive a letter, please call our office to schedule the follow-up appointment.   Any Other Special Instructions Will Be Listed Below (If Applicable).

## 2014-10-11 NOTE — Assessment & Plan Note (Signed)
His biv ICD is working normally. Will recheck in several months. We have reduced his AV delay.

## 2014-10-11 NOTE — Addendum Note (Signed)
Addended by: Eulis Foster on: 10/11/2014 09:47 AM   Modules accepted: Orders

## 2014-10-11 NOTE — Assessment & Plan Note (Signed)
His symptoms are class 3A and he appears a little dry. He has undergone AV optimization, and device reprogramming. Will see if this helps. He is encouraged to remain acitve and liberalize his fluid intake for the next 2-3 days or until his weight goes on his home scales up about 3 lbs.

## 2014-10-11 NOTE — Assessment & Plan Note (Signed)
This problem has resolved. Will follow.

## 2014-10-12 ENCOUNTER — Encounter: Payer: Medicare HMO | Admitting: Internal Medicine

## 2014-10-16 ENCOUNTER — Emergency Department (HOSPITAL_COMMUNITY): Payer: Medicare HMO

## 2014-10-16 ENCOUNTER — Inpatient Hospital Stay (HOSPITAL_COMMUNITY)
Admission: EM | Admit: 2014-10-16 | Discharge: 2014-10-17 | DRG: 392 | Disposition: A | Discharge status: Home / Self Care | Payer: Medicare HMO | Attending: Cardiology | Admitting: Cardiology

## 2014-10-16 ENCOUNTER — Encounter (HOSPITAL_COMMUNITY): Payer: Self-pay

## 2014-10-16 DIAGNOSIS — I214 Non-ST elevation (NSTEMI) myocardial infarction: Secondary | ICD-10-CM | POA: Diagnosis not present

## 2014-10-16 DIAGNOSIS — F419 Anxiety disorder, unspecified: Secondary | ICD-10-CM | POA: Diagnosis present

## 2014-10-16 DIAGNOSIS — M13812 Other specified arthritis, left shoulder: Secondary | ICD-10-CM | POA: Diagnosis present

## 2014-10-16 DIAGNOSIS — Z86010 Personal history of colonic polyps: Secondary | ICD-10-CM

## 2014-10-16 DIAGNOSIS — Z8249 Family history of ischemic heart disease and other diseases of the circulatory system: Secondary | ICD-10-CM

## 2014-10-16 DIAGNOSIS — R1013 Epigastric pain: Secondary | ICD-10-CM

## 2014-10-16 DIAGNOSIS — Z87891 Personal history of nicotine dependence: Secondary | ICD-10-CM

## 2014-10-16 DIAGNOSIS — Z8673 Personal history of transient ischemic attack (TIA), and cerebral infarction without residual deficits: Secondary | ICD-10-CM

## 2014-10-16 DIAGNOSIS — I252 Old myocardial infarction: Secondary | ICD-10-CM

## 2014-10-16 DIAGNOSIS — I255 Ischemic cardiomyopathy: Secondary | ICD-10-CM | POA: Diagnosis present

## 2014-10-16 DIAGNOSIS — N189 Chronic kidney disease, unspecified: Secondary | ICD-10-CM

## 2014-10-16 DIAGNOSIS — K219 Gastro-esophageal reflux disease without esophagitis: Secondary | ICD-10-CM | POA: Diagnosis present

## 2014-10-16 DIAGNOSIS — I2510 Atherosclerotic heart disease of native coronary artery without angina pectoris: Secondary | ICD-10-CM | POA: Diagnosis present

## 2014-10-16 DIAGNOSIS — E039 Hypothyroidism, unspecified: Secondary | ICD-10-CM | POA: Diagnosis present

## 2014-10-16 DIAGNOSIS — I5042 Chronic combined systolic (congestive) and diastolic (congestive) heart failure: Secondary | ICD-10-CM

## 2014-10-16 DIAGNOSIS — E785 Hyperlipidemia, unspecified: Secondary | ICD-10-CM | POA: Diagnosis present

## 2014-10-16 DIAGNOSIS — E876 Hypokalemia: Secondary | ICD-10-CM | POA: Diagnosis not present

## 2014-10-16 DIAGNOSIS — Z95810 Presence of automatic (implantable) cardiac defibrillator: Secondary | ICD-10-CM | POA: Diagnosis present

## 2014-10-16 DIAGNOSIS — I480 Paroxysmal atrial fibrillation: Secondary | ICD-10-CM | POA: Diagnosis present

## 2014-10-16 DIAGNOSIS — R079 Chest pain, unspecified: Secondary | ICD-10-CM | POA: Diagnosis not present

## 2014-10-16 DIAGNOSIS — I10 Essential (primary) hypertension: Secondary | ICD-10-CM | POA: Diagnosis present

## 2014-10-16 DIAGNOSIS — I129 Hypertensive chronic kidney disease with stage 1 through stage 4 chronic kidney disease, or unspecified chronic kidney disease: Secondary | ICD-10-CM | POA: Diagnosis present

## 2014-10-16 DIAGNOSIS — Z7901 Long term (current) use of anticoagulants: Secondary | ICD-10-CM

## 2014-10-16 DIAGNOSIS — I495 Sick sinus syndrome: Secondary | ICD-10-CM | POA: Diagnosis present

## 2014-10-16 DIAGNOSIS — M13822 Other specified arthritis, left elbow: Secondary | ICD-10-CM | POA: Diagnosis present

## 2014-10-16 DIAGNOSIS — Z885 Allergy status to narcotic agent status: Secondary | ICD-10-CM

## 2014-10-16 DIAGNOSIS — Z955 Presence of coronary angioplasty implant and graft: Secondary | ICD-10-CM

## 2014-10-16 DIAGNOSIS — Z8701 Personal history of pneumonia (recurrent): Secondary | ICD-10-CM

## 2014-10-16 DIAGNOSIS — I739 Peripheral vascular disease, unspecified: Secondary | ICD-10-CM | POA: Diagnosis present

## 2014-10-16 DIAGNOSIS — N183 Chronic kidney disease, stage 3 unspecified: Secondary | ICD-10-CM | POA: Diagnosis present

## 2014-10-16 DIAGNOSIS — I6529 Occlusion and stenosis of unspecified carotid artery: Secondary | ICD-10-CM | POA: Diagnosis present

## 2014-10-16 DIAGNOSIS — Z79899 Other long term (current) drug therapy: Secondary | ICD-10-CM

## 2014-10-16 DIAGNOSIS — I5023 Acute on chronic systolic (congestive) heart failure: Secondary | ICD-10-CM | POA: Diagnosis present

## 2014-10-16 LAB — CBC
HCT: 47 % (ref 39.0–52.0)
Hemoglobin: 15.4 g/dL (ref 13.0–17.0)
MCH: 31.7 pg (ref 26.0–34.0)
MCHC: 32.8 g/dL (ref 30.0–36.0)
MCV: 96.7 fL (ref 78.0–100.0)
Platelets: 122 10*3/uL — ABNORMAL LOW (ref 150–400)
RBC: 4.86 MIL/uL (ref 4.22–5.81)
RDW: 13.3 % (ref 11.5–15.5)
WBC: 7.4 10*3/uL (ref 4.0–10.5)

## 2014-10-16 LAB — BASIC METABOLIC PANEL
ANION GAP: 9 (ref 5–15)
BUN: 23 mg/dL — ABNORMAL HIGH (ref 6–20)
CHLORIDE: 96 mmol/L — AB (ref 101–111)
CO2: 31 mmol/L (ref 22–32)
CREATININE: 1.63 mg/dL — AB (ref 0.61–1.24)
Calcium: 9.2 mg/dL (ref 8.9–10.3)
GFR calc Af Amer: 45 mL/min — ABNORMAL LOW (ref >60)
GFR calc non Af Amer: 38 mL/min — ABNORMAL LOW (ref >60)
Glucose, Bld: 210 mg/dL — ABNORMAL HIGH (ref 65–99)
Potassium: 4.4 mmol/L (ref 3.5–5.1)
Sodium: 136 mmol/L (ref 135–145)

## 2014-10-16 LAB — PROTIME-INR
INR: 1.48 (ref 0.00–1.49)
Prothrombin Time: 18 s — ABNORMAL HIGH (ref 11.6–15.2)

## 2014-10-16 LAB — I-STAT TROPONIN, ED: Troponin i, poc: 0.03 ng/mL (ref 0.00–0.08)

## 2014-10-16 MED ORDER — NITROGLYCERIN 0.4 MG SL SUBL
0.4000 mg | SUBLINGUAL_TABLET | SUBLINGUAL | Status: DC | PRN
  Administered 2014-10-16: 0.4 mg via SUBLINGUAL
  Filled 2014-10-16: qty 1

## 2014-10-16 MED ORDER — ASPIRIN 81 MG PO CHEW
324.0000 mg | CHEWABLE_TABLET | Freq: Once | ORAL | Status: AC
  Administered 2014-10-16: 324 mg via ORAL
  Filled 2014-10-16: qty 4

## 2014-10-16 NOTE — ED Notes (Signed)
Pt here for chest pain, increased over the day, increases with breathing,

## 2014-10-16 NOTE — ED Provider Notes (Signed)
CSN: [id number]     Arrival date & time 10/16/14  2008 History   First MD Initiated Contact with Patient 10/16/14 2013     Chief Complaint  Patient presents with  . Chest Pain     (Consider location/radiation/quality/duration/timing/severity/associated sxs/prior Treatment) Patient is a 81 y.o. male presenting with chest pain.  Chest Pain Pain location:  Substernal area Pain quality: sharp   Pain radiates to:  Does not radiate Pain severity:  Severe Onset quality:  Gradual Duration:  1 day Timing:  Constant Progression:  Unchanged Chronicity:  New Context: breathing   Relieved by:  Nothing Worsened by:  Deep breathing and exertion Associated symptoms: no abdominal pain, no diaphoresis, no dizziness, no fever, no nausea, no shortness of breath and not vomiting     Past Medical History  Diagnosis Date  . Coronary artery disease     a. LHC (08/2012): Lmain: long, 20% stenosis distally LAD: mod calcified and mild dz prox. diff dz proximally 30%, first diagonal mild dz prox and small, Ramus intermediate brach lg bifurcates mid vessel prox. 90-95% stenosis LCx: relatively sml, rise to single marginal and contiine AV groove, 1st marg. diff 60-70% dz stenosis prox RCA: occ. prox. R-L coll, successful stent remus interm branch BMS  . Other and unspecified hyperlipidemia   . Hypertension   . Sinoatrial node dysfunction   . PVD (peripheral vascular disease)     s/p renal artery stent 20-04  . AAA (abdominal aortic aneurysm)     repaired  . Systolic congestive heart failure     a. ICM b.   . Ischemic cardiomyopathy   . Carotid artery occlusion   . Atrial fibrillation   . GERD (gastroesophageal reflux disease)   . Hypothyroidism   . Automatic implantable cardioverter-defibrillator in situ   . Myocardial infarction   . HCAP (healthcare-associated pneumonia) 2014  . Stroke 2014    left sided affected no residual effects  . Arthritis     "left elbow and shoulder" (09/24/2013)  .  Anxiety   . Colon polyp   . Congestive heart failure   . H/O blood clots   . Hyperlipidemia   . Pneumonia    Past Surgical History  Procedure Laterality Date  . Cardiac pacemaker placement  08/01/2009    st jude dual chamber; explanted 09/24/2013  . Colonoscopy    . Esophagogastroduodenoscopy    . Transluminal angioplasty    . Tee without cardioversion N/A 09/23/2012    Procedure: TRANSESOPHAGEAL ECHOCARDIOGRAM (TEE);  Surgeon: Lelon Perla, MD;  Location: [facility] ENDOSCOPY;  Service: Cardiovascular;  Laterality: N/A;  . Cardioversion N/A 09/23/2012    Procedure: CARDIOVERSION;  Surgeon: Lelon Perla, MD;  Location: [facility] ENDOSCOPY;  Service: Cardiovascular;  Laterality: N/A;  . Renal artery stent    . Abdominal aortic aneurysm repair  05/17/1993  . Coronary angioplasty with stent placement  08/2012    "1"  . Endarterectomy Right 11/25/2012    Procedure: ENDARTERECTOMY CAROTID With Dacron Patch Angioplasty;  Surgeon: Elam Dutch, MD;  Location: [place];  Service: Vascular;  Laterality: Right;  . Carotid endarterectomy Right 11-25-12  . Inguinal hernia repair Bilateral     "one at a time"  . Bi-ventricular implantable cardioverter defibrillator  (crt-d)  09/24/2013  . Cataract extraction w/ intraocular lens  implant, bilateral Bilateral   . Coronary artery bypass graft  05/17/1993    "CABG X4"  . Left and right heart catheterization with coronary angiogram N/A 09/15/2012  Procedure: LEFT AND RIGHT HEART CATHETERIZATION WITH CORONARY ANGIOGRAM;  Surgeon: Peter M Martinique, MD;  Location: [facility] CATH LAB;  Service: Cardiovascular;  Laterality: N/A;  . Percutaneous coronary stent intervention (pci-s)  09/15/2012    Procedure: PERCUTANEOUS CORONARY STENT INTERVENTION (PCI-S);  Surgeon: Peter M Martinique, MD;  Location: [facility] CATH LAB;  Service: Cardiovascular;;  . Right heart catheterization N/A 08/31/2013    Procedure: RIGHT HEART CATH;  Surgeon: Jolaine Artist, MD;  Location: [facility] CATH LAB;  Service:  Cardiovascular;  Laterality: N/A;  . Bi-ventricular implantable cardioverter defibrillator N/A 09/24/2013    Procedure: BI-VENTRICULAR IMPLANTABLE CARDIOVERTER DEFIBRILLATOR  (CRT-D);  Surgeon: Evans Lance, MD;  Location: [facility] CATH LAB;  Service: Cardiovascular;  Laterality: N/A;  . Defribrillator     Family History  Problem Relation Age of Onset  . CAD Father   . Hypertension Father   . Heart disease Father   . CAD Brother   . Heart disease Brother   . Heart attack Brother   . Diabetes Brother   . Lung cancer Sister   . Heart disease Sister   . Hypertension Sister   . Hypertension Mother   . Hypertension Daughter   . Vascular Disease Daughter     Right Carotid  . Colon cancer Son   . Cancer Son     Colon  . Colon polyps Brother     x2   History  Substance Use Topics  . Smoking status: Former Smoker -- 1.00 packs/day for 50 years    Types: Cigarettes    Quit date: 05/17/1993  . Smokeless tobacco: Current User    Types: Chew  . Alcohol Use: No    Review of Systems  Constitutional: Negative for fever and diaphoresis.  Respiratory: Negative for shortness of breath.   Cardiovascular: Positive for chest pain.  Gastrointestinal: Negative for nausea, vomiting and abdominal pain.  Neurological: Negative for dizziness.  All other systems reviewed and are negative.     Allergies  Codeine and Morphine  Home Medications   Prior to Admission medications   Medication Sig Start Date End Date Taking? Authorizing Provider  ALPRAZolam (XANAX) 0.25 MG tablet Take 0.375 mg by mouth at bedtime.    Historical Provider, MD  amiodarone (PACERONE) 200 MG tablet Take 100 mg by mouth daily.    Historical Provider, MD  apixaban (ELIQUIS) 5 MG TABS tablet Take 1 tablet (5 mg total) by mouth 2 (two) times daily. 09/30/14   Larey Dresser, MD  atorvastatin (LIPITOR) 20 MG tablet Take 20 mg by mouth daily at 6 PM.     Historical Provider, MD  B Complex-C (SUPER B COMPLEX) TABS Take 1 tablet  by mouth daily.     Historical Provider, MD  calcium carbonate (OS-CAL) 600 MG TABS Take 600 mg by mouth daily with breakfast.     Historical Provider, MD  carvedilol (COREG) 3.125 MG tablet Take 1 tablet (3.125 mg total) by mouth at bedtime. 10/05/14   Jolaine Artist, MD  cetirizine (ZYRTEC) 10 MG tablet Take 10 mg by mouth daily.  08/24/10   Historical Provider, MD  digoxin (LANOXIN) 0.125 MG tablet Take 0.5 tablets (0.0625 mg total) by mouth daily. 06/16/14   Larey Dresser, MD  furosemide (LASIX) 80 MG tablet Take 1 tablet (80 mg total) by mouth daily. 09/22/14   Evans Lance, MD  levothyroxine (SYNTHROID, LEVOTHROID) 112 MCG tablet Take 112 mcg by mouth daily.    Historical Provider, MD  magnesium  gluconate (MAGONATE) 500 MG tablet Take 500 mg by mouth daily.     Historical Provider, MD  Multiple Vitamin (MULTIVITAMIN WITH MINERALS) TABS Take 1 tablet by mouth daily.    Historical Provider, MD  nitroGLYCERIN (NITROSTAT) 0.4 MG SL tablet Place 1 tablet (0.4 mg total) under the tongue every 5 (five) minutes as needed for chest pain. 01/06/14   Evans Lance, MD  Omega-3 Fatty Acids (FISH OIL) 1200 MG CAPS Take 1,200 mg by mouth 2 (two) times daily.    Historical Provider, MD  omeprazole (PRILOSEC) 20 MG capsule Take 20 mg by mouth daily as needed (indigestion).     Historical Provider, MD  Saw Palmetto, Serenoa repens, (SAW PALMETTO PO) Take 1 capsule by mouth 2 (two) times daily.    Historical Provider, MD  tamsulosin (FLOMAX) 0.4 MG CAPS capsule Take 0.4 mg by mouth daily after supper.  08/19/13   Historical Provider, MD  tiotropium (SPIRIVA) 18 MCG inhalation capsule Place 1 capsule (18 mcg total) into inhaler and inhale daily. 09/23/14   Brand Males, MD  vitamin C (ASCORBIC ACID) 500 MG tablet Take 500 mg by mouth daily.      Historical Provider, MD   BP 135/55 mmHg  Pulse 70  Temp(Src) 97.9 F (36.6 C) (Oral)  Resp 21  Ht 6' (1.829 m)  Wt 156 lb (70.761 kg)  BMI 21.15 kg/m2  SpO2  98% Physical Exam  Constitutional: He is oriented to person, place, and time. He appears well-developed and well-nourished. No distress.  Thin  HENT:  Head: Normocephalic and atraumatic.  Mouth/Throat: Oropharynx is clear and moist.  Eyes: Conjunctivae are normal. Pupils are equal, round, and reactive to light. No scleral icterus.  Neck: Neck supple.  Cardiovascular: Normal rate, regular rhythm, normal heart sounds and intact distal pulses.   No murmur heard. Pulmonary/Chest: Effort normal and breath sounds normal. No stridor. No respiratory distress. He has no wheezes. He has no rales. He exhibits no tenderness.  Abdominal: Soft. He exhibits no distension. There is no tenderness.  Musculoskeletal: Normal range of motion. He exhibits no edema.  Neurological: He is alert and oriented to person, place, and time.  Skin: Skin is warm and dry. No rash noted.  Psychiatric: He has a normal mood and affect. His behavior is normal.  Nursing note and vitals reviewed.   ED Course  Procedures (including critical care time) Labs Review Labs Reviewed  BASIC METABOLIC PANEL - Abnormal; Notable for the following:    Chloride 96 (*)    Glucose, Bld 210 (*)    BUN 23 (*)    Creatinine, Ser 1.63 (*)    GFR calc non Af Amer 38 (*)    GFR calc Af Amer 45 (*)    All other components within normal limits  CBC - Abnormal; Notable for the following:    Platelets 122 (*)    All other components within normal limits  PROTIME-INR - Abnormal; Notable for the following:    Prothrombin Time 18.0 (*)    All other components within normal limits  I-STAT TROPOININ, ED    Imaging Review Dg Chest 2 View  10/16/2014   CLINICAL DATA:  Chest pain and shortness of breath beginning yesterday. Coronary artery disease.  EXAM: CHEST  2 VIEW  COMPARISON:  09/25/2013  FINDINGS: The heart size and mediastinal contours are within normal limits. Transvenous pacemaker remains in stable position. Right middle lobe scarring  is unchanged. No evidence of acute infiltrate or pulmonary  edema. No evidence of pneumothorax or pleural effusion.  IMPRESSION: Stable right middle lobe scarring.  No active disease.   Electronically Signed   By: Earle Gell M.D.   On: 10/16/2014 20:57     EKG Interpretation   Date/Time:  Saturday October 16 2014 20:12:30 EDT Ventricular Rate:  74 PR Interval:  126 QRS Duration: 204 QT Interval:  470 QTC Calculation: 521 R Axis:   -94 Text Interpretation:  AV dual-paced rhythm Abnormal ECG Confirmed by  [facility]  MD, TREY ([id number]) on 10/16/2014 11:53:48 PM      MDM   Final diagnoses:  Chest pain, unspecified chest pain type    Pleuritic chest pain. Unlikely PE due to current anticoagulation, stable vitals without hypoxia or tachycardia. History is also atypical for dissection.  Pulses are equal and mediastinum is not widened.  Chest pain is atypical for ACS, but he has a significant cardiac history. Therefore, ask cardiology to evaluate him. They feel he is stable for discharge home with his delta troponin is negative.  Second troponin elevated, admitted.  Serita Grit, MD 10/17/14 [phone]

## 2014-10-16 NOTE — Consult Note (Addendum)
Referring Physician: Dr. Doy Mince Primary Physician: Primary Cardiologist:Dr. Bensimhon Reason for Consultation: "chest pain"   HPI: 81 y/o man with pmh of  end-stage ischemic cardiomyopathy, left branch block, paroxysmal atrial fibrillation, s/p[ BiV -ICD, EF 25% with mild MR/AI on guideline medical therapy doing very well and compensated came to ED with c/o epigastric pain started last night constant , no radiation , worse with breathing and relieved with hold breath. No associated DOE , orthopnea. [facility] and tried a new machine for arm exercise and thinks he may have pulled his muscle with that. In ED trop and ekg negative. Takes his meds regularly His wife is along with him in ED    Review of Systems:     Cardiac Review of Systems: {Y] = yes '[ ]'$  = no  Chest Pain [    ]  Resting SOB [   ] Exertional SOB  [  ]  Orthopnea [  ]   Pedal Edema [   ]    Palpitations [  ] Syncope  [  ]   Presyncope [   ]  General Review of Systems: [Y] = yes [  ]=no Constitional: recent weight change [  ]; anorexia [  ]; fatigue [  ]; nausea [  ]; night sweats [  ]; fever [  ]; or chills [  ];                                                                     Eyes : blurred vision [  ]; diplopia [   ]; vision changes [  ];  Amaurosis fugax[  ]; Resp: cough [  ];  wheezing[  ];  hemoptysis[  ];  PND [  ];  GI:  gallstones[  ], vomiting[  ];  dysphagia[  ]; melena[  ];  hematochezia [  ]; heartburn[  ];   GU: kidney stones [  ]; hematuria[  ];   dysuria [  ];  nocturia[  ]; incontinence [  ];             Skin: rash, swelling[  ];, hair loss[  ];  peripheral edema[  ];  or itching[  ]; Musculosketetal: myalgias[  ];  joint swelling[  ];  joint erythema[  ];  joint pain[  ];  back pain[  ];  Heme/Lymph: bruising[  ];  bleeding[  ];  anemia[  ];  Neuro: TIA[  ];  headaches[  ];  stroke[  ];  vertigo[  ];  seizures[  ];   paresthesias[  ];  difficulty walking[   ];  Psych:depression[  ]; anxiety[  ];  Endocrine: diabetes[  ];  thyroid dysfunction[  ];  Other:  Past Medical History  Diagnosis Date  . Coronary artery disease     a. LHC (08/2012): Lmain: long, 20% stenosis distally LAD: mod calcified and mild dz prox. diff dz proximally 30%, first diagonal mild dz prox and small, Ramus intermediate brach lg bifurcates mid vessel prox. 90-95% stenosis LCx: relatively sml, rise to single marginal and contiine AV groove, 1st marg. diff 60-70% dz stenosis prox RCA: occ. prox. R-L coll, successful stent remus interm  branch BMS  . Other and unspecified hyperlipidemia   . Hypertension   . Sinoatrial node dysfunction   . PVD (peripheral vascular disease)     s/p renal artery stent 20-04  . AAA (abdominal aortic aneurysm)     repaired  . Systolic congestive heart failure     a. ICM b.   . Ischemic cardiomyopathy   . Carotid artery occlusion   . Atrial fibrillation   . GERD (gastroesophageal reflux disease)   . Hypothyroidism   . Automatic implantable cardioverter-defibrillator in situ   . Myocardial infarction   . HCAP (healthcare-associated pneumonia) 2014  . Stroke 2014    left sided affected no residual effects  . Arthritis     "left elbow and shoulder" (09/24/2013)  . Anxiety   . Colon polyp   . Congestive heart failure   . H/O blood clots   . Hyperlipidemia   . Pneumonia      (Not in a hospital admission) No current facility-administered medications on file prior to encounter.   Current Outpatient Prescriptions on File Prior to Encounter  Medication Sig Dispense Refill  . ALPRAZolam (XANAX) 0.25 MG tablet Take 0.375 mg by mouth at bedtime.    Marland Kitchen amiodarone (PACERONE) 200 MG tablet Take 100 mg by mouth daily.    Marland Kitchen apixaban (ELIQUIS) 5 MG TABS tablet Take 1 tablet (5 mg total) by mouth 2 (two) times daily. 60 tablet 6  . atorvastatin (LIPITOR) 20 MG tablet Take 20 mg by mouth daily at 6 PM.     . B Complex-C (SUPER B COMPLEX) TABS Take 1  tablet by mouth daily.     . calcium carbonate (OS-CAL) 600 MG TABS Take 600 mg by mouth daily with breakfast.     . carvedilol (COREG) 3.125 MG tablet Take 1 tablet (3.125 mg total) by mouth at bedtime. 30 tablet 3  . cetirizine (ZYRTEC) 10 MG tablet Take 10 mg by mouth daily.     . digoxin (LANOXIN) 0.125 MG tablet Take 0.5 tablets (0.0625 mg total) by mouth daily. 90 tablet 0  . furosemide (LASIX) 80 MG tablet Take 1 tablet (80 mg total) by mouth daily. 90 tablet 3  . levothyroxine (SYNTHROID, LEVOTHROID) 112 MCG tablet Take 112 mcg by mouth daily.    . magnesium gluconate (MAGONATE) 500 MG tablet Take 500 mg by mouth daily.     . Multiple Vitamin (MULTIVITAMIN WITH MINERALS) TABS Take 1 tablet by mouth daily.    . nitroGLYCERIN (NITROSTAT) 0.4 MG SL tablet Place 1 tablet (0.4 mg total) under the tongue every 5 (five) minutes as needed for chest pain. 25 tablet 3  . Omega-3 Fatty Acids (FISH OIL) 1200 MG CAPS Take 1,200 mg by mouth 2 (two) times daily.    Marland Kitchen omeprazole (PRILOSEC) 20 MG capsule Take 20 mg by mouth daily as needed (indigestion).     . Saw Palmetto, Serenoa repens, (SAW PALMETTO PO) Take 1 capsule by mouth 2 (two) times daily.    . tamsulosin (FLOMAX) 0.4 MG CAPS capsule Take 0.4 mg by mouth daily after supper.     . tiotropium (SPIRIVA) 18 MCG inhalation capsule Place 1 capsule (18 mcg total) into inhaler and inhale daily. 30 capsule 6  . vitamin C (ASCORBIC ACID) 500 MG tablet Take 500 mg by mouth daily.      . [DISCONTINUED] diltiazem (CARDIZEM CD) 120 MG 24 hr capsule           Infusions:    Allergies  Allergen Reactions  . Codeine     "tripped out on it"  . Morphine     "tripped out on it"    History   Social History  . Marital Status: Married    Spouse Name: N/A  . Number of Children: 4  . Years of Education: N/A   Occupational History  . Retired    Social History Main Topics  . Smoking status: Former Smoker -- 1.00 packs/day for 50 years    Types:  Cigarettes    Quit date: 05/17/1993  . Smokeless tobacco: Current User    Types: Chew  . Alcohol Use: No  . Drug Use: No  . Sexual Activity: Not Currently   Other Topics Concern  . Not on file   Social History Narrative    Family History  Problem Relation Age of Onset  . CAD Father   . Hypertension Father   . Heart disease Father   . CAD Brother   . Heart disease Brother   . Heart attack Brother   . Diabetes Brother   . Lung cancer Sister   . Heart disease Sister   . Hypertension Sister   . Hypertension Mother   . Hypertension Daughter   . Vascular Disease Daughter     Right Carotid  . Colon cancer Son   . Cancer Son     Colon  . Colon polyps Brother     x2    PHYSICAL EXAM: Filed Vitals:   10/16/14 2230  BP: 130/53  Pulse: 70  Temp:   Resp: 16    No intake or output data in the 24 hours ending 10/16/14 2256  General:  Well appearing. No respiratory difficulty HEENT: normal Neck: supple. no JVD. Carotids 2+ bilat; no bruits. No lymphadenopathy or thryomegaly appreciated. Cor: PMI nondisplaced. Regular rate & rhythm. No rubs, gallops or murmurs., Left ICD + Lungs: clear Abdomen: soft, nontender, nondistended. No hepatosplenomegaly. No bruits or masses. Good bowel sounds. Extremities: no cyanosis, clubbing, rash, edema Neuro: alert & oriented x 3, cranial nerves grossly intact. moves all 4 extremities w/o difficulty. Affect pleasant.  ECG:  Results for orders placed or performed during the hospital encounter of 10/16/14 (from the past 24 hour(s))  Basic metabolic panel     Status: Abnormal   Collection Time: 10/16/14  8:24 PM  Result Value Ref Range   Sodium 136 135 - 145 mmol/L   Potassium 4.4 3.5 - 5.1 mmol/L   Chloride 96 (L) 101 - 111 mmol/L   CO2 31 22 - 32 mmol/L   Glucose, Bld 210 (H) 65 - 99 mg/dL   BUN 23 (H) 6 - 20 mg/dL   Creatinine, Ser 1.63 (H) 0.61 - 1.24 mg/dL   Calcium 9.2 8.9 - 10.3 mg/dL   GFR calc non Af Amer 38 (L) >60 mL/min    GFR calc Af Amer 45 (L) >60 mL/min   Anion gap 9 5 - 15  CBC     Status: Abnormal   Collection Time: 10/16/14  8:24 PM  Result Value Ref Range   WBC 7.4 4.0 - 10.5 K/uL   RBC 4.86 4.22 - 5.81 MIL/uL   Hemoglobin 15.4 13.0 - 17.0 g/dL   HCT 47.0 39.0 - 52.0 %   MCV 96.7 78.0 - 100.0 fL   MCH 31.7 26.0 - 34.0 pg   MCHC 32.8 30.0 - 36.0 g/dL   RDW 13.3 11.5 - 15.5 %   Platelets 122 (L) 150 - 400 K/uL  Protime-INR - (order if Patient is taking Coumadin / Warfarin)     Status: Abnormal   Collection Time: 10/16/14  8:24 PM  Result Value Ref Range   Prothrombin Time 18.0 (H) 11.6 - 15.2 seconds   INR 1.48 0.00 - 1.49  I-Stat Troponin, ED (not at [facility])     Status: None   Collection Time: 10/16/14  8:53 PM  Result Value Ref Range   Troponin i, poc 0.03 0.00 - 0.08 ng/mL   Comment 3           Dg Chest 2 View  10/16/2014   CLINICAL DATA:  Chest pain and shortness of breath beginning yesterday. Coronary artery disease.  EXAM: CHEST  2 VIEW  COMPARISON:  09/25/2013  FINDINGS: The heart size and mediastinal contours are within normal limits. Transvenous pacemaker remains in stable position. Right middle lobe scarring is unchanged. No evidence of acute infiltrate or pulmonary edema. No evidence of pneumothorax or pleural effusion.  IMPRESSION: Stable right middle lobe scarring.  No active disease.   Electronically Signed   By: Earle Gell M.D.   On: 10/16/2014 20:57    Active Problems:   Essential hypertension   Coronary atherosclerosis   Biventricular ICD (implantable cardioverter-defibrillator) in place   Cardiomyopathy, ischemic   Chronic systolic CHF (congestive heart failure)   Epigastric pain NSTEMI CKD with Cr at 1.6 baseline PAF ASSESSMENT: Chest/epigastric pain pleuritic in nature and less likely to be cardiac given the nature of pain He is very well compensated from CHF standpoint CKD monitor Cr NSTEMI Chronic combined CHF continue current meds  PLAN/DISCUSSION: 1. Initial  plan was to Cycle trop and if negative he can be discharged home with his current home meds to continue 2. But his second set of trop is barely positive at 0.03. 3. Will cycle trop further 4. Admit to SDU  5. Continue home meds 6. Heparin drip, hold eliquis for now   Sulaiman Durenda Age, MD

## 2014-10-17 ENCOUNTER — Encounter (HOSPITAL_COMMUNITY): Payer: Self-pay | Admitting: *Deleted

## 2014-10-17 DIAGNOSIS — Z86010 Personal history of colonic polyps: Secondary | ICD-10-CM | POA: Diagnosis not present

## 2014-10-17 DIAGNOSIS — R1013 Epigastric pain: Secondary | ICD-10-CM | POA: Diagnosis present

## 2014-10-17 DIAGNOSIS — Z955 Presence of coronary angioplasty implant and graft: Secondary | ICD-10-CM | POA: Diagnosis not present

## 2014-10-17 DIAGNOSIS — E039 Hypothyroidism, unspecified: Secondary | ICD-10-CM | POA: Diagnosis present

## 2014-10-17 DIAGNOSIS — I10 Essential (primary) hypertension: Secondary | ICD-10-CM

## 2014-10-17 DIAGNOSIS — R079 Chest pain, unspecified: Secondary | ICD-10-CM | POA: Diagnosis present

## 2014-10-17 DIAGNOSIS — I252 Old myocardial infarction: Secondary | ICD-10-CM | POA: Diagnosis not present

## 2014-10-17 DIAGNOSIS — Z87891 Personal history of nicotine dependence: Secondary | ICD-10-CM | POA: Diagnosis not present

## 2014-10-17 DIAGNOSIS — R7989 Other specified abnormal findings of blood chemistry: Secondary | ICD-10-CM

## 2014-10-17 DIAGNOSIS — Z7901 Long term (current) use of anticoagulants: Secondary | ICD-10-CM | POA: Diagnosis not present

## 2014-10-17 DIAGNOSIS — N183 Chronic kidney disease, stage 3 unspecified: Secondary | ICD-10-CM | POA: Diagnosis present

## 2014-10-17 DIAGNOSIS — Z8673 Personal history of transient ischemic attack (TIA), and cerebral infarction without residual deficits: Secondary | ICD-10-CM | POA: Diagnosis not present

## 2014-10-17 DIAGNOSIS — I6529 Occlusion and stenosis of unspecified carotid artery: Secondary | ICD-10-CM | POA: Diagnosis present

## 2014-10-17 DIAGNOSIS — I5042 Chronic combined systolic (congestive) and diastolic (congestive) heart failure: Secondary | ICD-10-CM | POA: Diagnosis present

## 2014-10-17 DIAGNOSIS — K219 Gastro-esophageal reflux disease without esophagitis: Secondary | ICD-10-CM | POA: Diagnosis present

## 2014-10-17 DIAGNOSIS — Z885 Allergy status to narcotic agent status: Secondary | ICD-10-CM | POA: Diagnosis not present

## 2014-10-17 DIAGNOSIS — I5022 Chronic systolic (congestive) heart failure: Secondary | ICD-10-CM | POA: Diagnosis not present

## 2014-10-17 DIAGNOSIS — F419 Anxiety disorder, unspecified: Secondary | ICD-10-CM | POA: Diagnosis present

## 2014-10-17 DIAGNOSIS — E785 Hyperlipidemia, unspecified: Secondary | ICD-10-CM | POA: Diagnosis present

## 2014-10-17 DIAGNOSIS — I2510 Atherosclerotic heart disease of native coronary artery without angina pectoris: Secondary | ICD-10-CM

## 2014-10-17 DIAGNOSIS — I255 Ischemic cardiomyopathy: Secondary | ICD-10-CM

## 2014-10-17 DIAGNOSIS — E876 Hypokalemia: Secondary | ICD-10-CM | POA: Diagnosis not present

## 2014-10-17 DIAGNOSIS — I214 Non-ST elevation (NSTEMI) myocardial infarction: Secondary | ICD-10-CM | POA: Diagnosis present

## 2014-10-17 DIAGNOSIS — M13822 Other specified arthritis, left elbow: Secondary | ICD-10-CM | POA: Diagnosis present

## 2014-10-17 DIAGNOSIS — I129 Hypertensive chronic kidney disease with stage 1 through stage 4 chronic kidney disease, or unspecified chronic kidney disease: Secondary | ICD-10-CM | POA: Diagnosis present

## 2014-10-17 DIAGNOSIS — I739 Peripheral vascular disease, unspecified: Secondary | ICD-10-CM | POA: Diagnosis present

## 2014-10-17 DIAGNOSIS — Z95810 Presence of automatic (implantable) cardiac defibrillator: Secondary | ICD-10-CM

## 2014-10-17 DIAGNOSIS — I480 Paroxysmal atrial fibrillation: Secondary | ICD-10-CM | POA: Diagnosis present

## 2014-10-17 DIAGNOSIS — Z79899 Other long term (current) drug therapy: Secondary | ICD-10-CM | POA: Diagnosis not present

## 2014-10-17 DIAGNOSIS — M13812 Other specified arthritis, left shoulder: Secondary | ICD-10-CM | POA: Diagnosis present

## 2014-10-17 DIAGNOSIS — I495 Sick sinus syndrome: Secondary | ICD-10-CM | POA: Diagnosis present

## 2014-10-17 DIAGNOSIS — Z8249 Family history of ischemic heart disease and other diseases of the circulatory system: Secondary | ICD-10-CM | POA: Diagnosis not present

## 2014-10-17 DIAGNOSIS — Z8701 Personal history of pneumonia (recurrent): Secondary | ICD-10-CM | POA: Diagnosis not present

## 2014-10-17 LAB — BASIC METABOLIC PANEL
Anion gap: 7 (ref 5–15)
BUN: 20 mg/dL (ref 6–20)
CO2: 35 mmol/L — ABNORMAL HIGH (ref 22–32)
Calcium: 8.9 mg/dL (ref 8.9–10.3)
Chloride: 95 mmol/L — ABNORMAL LOW (ref 101–111)
Creatinine, Ser: 1.34 mg/dL — ABNORMAL HIGH (ref 0.61–1.24)
GFR calc Af Amer: 56 mL/min — ABNORMAL LOW (ref >60)
GFR calc non Af Amer: 49 mL/min — ABNORMAL LOW (ref >60)
Glucose, Bld: 124 mg/dL — ABNORMAL HIGH (ref 65–99)
Potassium: 3.3 mmol/L — ABNORMAL LOW (ref 3.5–5.1)
Sodium: 137 mmol/L (ref 135–145)

## 2014-10-17 LAB — DIGOXIN LEVEL: Digoxin Level: 0.6 ng/mL — ABNORMAL LOW (ref 0.8–2.0)

## 2014-10-17 LAB — CBC
HCT: 45 % (ref 39.0–52.0)
Hemoglobin: 14.8 g/dL (ref 13.0–17.0)
MCH: 31.9 pg (ref 26.0–34.0)
MCHC: 32.9 g/dL (ref 30.0–36.0)
MCV: 97 fL (ref 78.0–100.0)
Platelets: 122 10*3/uL — ABNORMAL LOW (ref 150–400)
RBC: 4.64 MIL/uL (ref 4.22–5.81)
RDW: 13.6 % (ref 11.5–15.5)
WBC: 6.1 10*3/uL (ref 4.0–10.5)

## 2014-10-17 LAB — TROPONIN I
TROPONIN I: 0.04 ng/mL — AB (ref <0.031)
Troponin I: 0.04 ng/mL — ABNORMAL HIGH (ref <0.031)
Troponin I: 0.04 ng/mL — ABNORMAL HIGH (ref <0.031)

## 2014-10-17 LAB — APTT
aPTT: 69 seconds — ABNORMAL HIGH (ref 24–37)
aPTT: 83 seconds — ABNORMAL HIGH (ref 24–37)

## 2014-10-17 LAB — MRSA PCR SCREENING: MRSA by PCR: NEGATIVE

## 2014-10-17 LAB — HEPARIN LEVEL (UNFRACTIONATED): Heparin Unfractionated: 1.78 IU/mL — ABNORMAL HIGH (ref 0.30–0.70)

## 2014-10-17 MED ORDER — TAMSULOSIN HCL 0.4 MG PO CAPS: 0.4000 mg | ORAL_CAPSULE | Freq: Every day | ORAL | Status: DC

## 2014-10-17 MED ORDER — HEPARIN (PORCINE) IN NACL 100-0.45 UNIT/ML-% IJ SOLN
1000.0000 [IU]/h | INTRAMUSCULAR | Status: DC
  Administered 2014-10-17: 1000 [IU]/h via INTRAVENOUS

## 2014-10-17 MED ORDER — LEVOTHYROXINE SODIUM 112 MCG PO TABS
112.0000 ug | ORAL_TABLET | Freq: Every day | ORAL | Status: DC
  Administered 2014-10-17: 112 ug via ORAL
  Filled 2014-10-17: qty 1

## 2014-10-17 MED ORDER — ASPIRIN EC 81 MG PO TBEC
81.0000 mg | DELAYED_RELEASE_TABLET | Freq: Every day | ORAL | Status: DC
  Administered 2014-10-17: 81 mg via ORAL
  Filled 2014-10-17: qty 1

## 2014-10-17 MED ORDER — TIOTROPIUM BROMIDE MONOHYDRATE 18 MCG IN CAPS
18.0000 ug | ORAL_CAPSULE | Freq: Every day | RESPIRATORY_TRACT | Status: DC
  Filled 2014-10-17: qty 5

## 2014-10-17 MED ORDER — FUROSEMIDE 80 MG PO TABS
80.0000 mg | ORAL_TABLET | Freq: Every day | ORAL | Status: DC
  Administered 2014-10-17: 80 mg via ORAL
  Filled 2014-10-17: qty 1

## 2014-10-17 MED ORDER — SODIUM CHLORIDE 0.9 % IJ SOLN
3.0000 mL | Freq: Two times a day (BID) | INTRAMUSCULAR | Status: DC
  Administered 2014-10-17: 3 mL via INTRAVENOUS

## 2014-10-17 MED ORDER — ATORVASTATIN CALCIUM 20 MG PO TABS: 20.0000 mg | ORAL_TABLET | Freq: Every day | ORAL | Status: DC

## 2014-10-17 MED ORDER — PANTOPRAZOLE SODIUM 40 MG PO TBEC
40.0000 mg | DELAYED_RELEASE_TABLET | Freq: Every day | ORAL | Status: DC
  Filled 2014-10-17: qty 1

## 2014-10-17 MED ORDER — CARVEDILOL 3.125 MG PO TABS: 3.1250 mg | ORAL_TABLET | Freq: Every day | ORAL | Status: DC

## 2014-10-17 MED ORDER — SALINE SPRAY 0.65 % NA SOLN
1.0000 | NASAL | Status: DC | PRN
  Administered 2014-10-17: 1 via NASAL
  Filled 2014-10-17: qty 44

## 2014-10-17 MED ORDER — DIGOXIN 125 MCG PO TABS
0.0625 mg | ORAL_TABLET | Freq: Every day | ORAL | Status: DC
  Administered 2014-10-17: 0.0625 mg via ORAL
  Filled 2014-10-17: qty 1

## 2014-10-17 MED ORDER — AMIODARONE HCL 100 MG PO TABS
100.0000 mg | ORAL_TABLET | Freq: Every day | ORAL | Status: DC
  Administered 2014-10-17: 100 mg via ORAL
  Filled 2014-10-17: qty 1

## 2014-10-17 MED ORDER — FLUTICASONE PROPIONATE 50 MCG/ACT NA SUSP
1.0000 | Freq: Every day | NASAL | Status: DC
  Administered 2014-10-17: 1 via NASAL
  Filled 2014-10-17: qty 16

## 2014-10-17 NOTE — Progress Notes (Signed)
ANTICOAGULATION CONSULT NOTE - Initial Consult  Pharmacy Consult for heparin Indication: chest pain/ACS and atrial fibrillation  Allergies  Allergen Reactions  . Codeine     "tripped out on it"  . Morphine     "tripped out on it"    Patient Measurements: Height: 6' (182.9 cm) Weight: 156 lb (70.761 kg) IBW/kg (Calculated) : 77.6  Vital Signs: Temp: 97.9 F (36.6 C) (07/30 2017) Temp Source: Oral (07/30 2017) BP: 142/61 mmHg (07/31 0030) Pulse Rate: 70 (07/31 0030)  Labs:  Recent Labs  10/16/14 2024 10/17/14 0006  HGB 15.4  --   HCT 47.0  --   PLT 122*  --   LABPROT 18.0*  --   INR 1.48  --   CREATININE 1.63*  --   TROPONINI  --  0.04*    Estimated Creatinine Clearance: 36.8 mL/min (by C-G formula based on Cr of 1.63).   Medical History: Past Medical History  Diagnosis Date  . Coronary artery disease     a. LHC (08/2012): Lmain: long, 20% stenosis distally LAD: mod calcified and mild dz prox. diff dz proximally 30%, first diagonal mild dz prox and small, Ramus intermediate brach lg bifurcates mid vessel prox. 90-95% stenosis LCx: relatively sml, rise to single marginal and contiine AV groove, 1st marg. diff 60-70% dz stenosis prox RCA: occ. prox. R-L coll, successful stent remus interm branch BMS  . Other and unspecified hyperlipidemia   . Hypertension   . Sinoatrial node dysfunction   . PVD (peripheral vascular disease)     s/p renal artery stent 20-04  . AAA (abdominal aortic aneurysm)     repaired  . Systolic congestive heart failure     a. ICM b.   . Ischemic cardiomyopathy   . Carotid artery occlusion   . Atrial fibrillation   . GERD (gastroesophageal reflux disease)   . Hypothyroidism   . Automatic implantable cardioverter-defibrillator in situ   . Myocardial infarction   . HCAP (healthcare-associated pneumonia) 2014  . Stroke 2014    left sided affected no residual effects  . Arthritis     "left elbow and shoulder" (09/24/2013)  . Anxiety   .  Colon polyp   . Congestive heart failure   . H/O blood clots   . Hyperlipidemia   . Pneumonia     Medications:  Prescriptions prior to admission  Medication Sig Dispense Refill Last Dose  . ALPRAZolam (XANAX) 0.25 MG tablet Take 0.375 mg by mouth at bedtime.   Taking  . amiodarone (PACERONE) 200 MG tablet Take 100 mg by mouth daily.   Taking  . apixaban (ELIQUIS) 5 MG TABS tablet Take 1 tablet (5 mg total) by mouth 2 (two) times daily. 60 tablet 6 Taking  . atorvastatin (LIPITOR) 20 MG tablet Take 20 mg by mouth daily at 6 PM.    Taking  . B Complex-C (SUPER B COMPLEX) TABS Take 1 tablet by mouth daily.    Taking  . calcium carbonate (OS-CAL) 600 MG TABS Take 600 mg by mouth daily with breakfast.    Taking  . carvedilol (COREG) 3.125 MG tablet Take 1 tablet (3.125 mg total) by mouth at bedtime. 30 tablet 3 Taking  . cetirizine (ZYRTEC) 10 MG tablet Take 10 mg by mouth daily.    Taking  . digoxin (LANOXIN) 0.125 MG tablet Take 0.5 tablets (0.0625 mg total) by mouth daily. 90 tablet 0 Taking  . furosemide (LASIX) 80 MG tablet Take 1 tablet (80 mg total) by  mouth daily. 90 tablet 3 Taking  . levothyroxine (SYNTHROID, LEVOTHROID) 112 MCG tablet Take 112 mcg by mouth daily.   Taking  . magnesium gluconate (MAGONATE) 500 MG tablet Take 500 mg by mouth daily.    Taking  . Multiple Vitamin (MULTIVITAMIN WITH MINERALS) TABS Take 1 tablet by mouth daily.   Taking  . nitroGLYCERIN (NITROSTAT) 0.4 MG SL tablet Place 1 tablet (0.4 mg total) under the tongue every 5 (five) minutes as needed for chest pain. 25 tablet 3 Taking  . Omega-3 Fatty Acids (FISH OIL) 1200 MG CAPS Take 1,200 mg by mouth 2 (two) times daily.   Taking  . omeprazole (PRILOSEC) 20 MG capsule Take 20 mg by mouth daily as needed (indigestion).    Taking  . Saw Palmetto, Serenoa repens, (SAW PALMETTO PO) Take 1 capsule by mouth 2 (two) times daily.   Taking  . tamsulosin (FLOMAX) 0.4 MG CAPS capsule Take 0.4 mg by mouth daily after  supper.    Taking  . tiotropium (SPIRIVA) 18 MCG inhalation capsule Place 1 capsule (18 mcg total) into inhaler and inhale daily. 30 capsule 6 Taking  . vitamin C (ASCORBIC ACID) 500 MG tablet Take 500 mg by mouth daily.     Taking   Scheduled:  . amiodarone  100 mg Oral Daily  . aspirin EC  81 mg Oral Daily  . atorvastatin  20 mg Oral q1800  . carvedilol  3.125 mg Oral QHS  . digoxin  0.0625 mg Oral Daily  . furosemide  80 mg Oral Daily  . levothyroxine  112 mcg Oral QAC breakfast  . pantoprazole  40 mg Oral Daily  . sodium chloride  3 mL Intravenous Q12H  . tamsulosin  0.4 mg Oral QPC supper  . tiotropium  18 mcg Inhalation Daily    Assessment: 81yo male c/o worsening CP, worsens w/ inspiration, initial troponin negative but now mildly elevated, to cycle troponins before decision to do cath, to transition from Eliquis to heparin in case cath needed; last dose of Eliquis was 7/30 in the am.  Goal of Therapy:  Heparin level 0.3-0.7 units/ml Heparin level 66-102 units/ml Monitor platelets by anticoagulation protocol: Yes   Plan:  Will begin heparin gtt at 1000 units/hr and monitor heparin levels and CBC.  Wynona Neat, PharmD, BCPS  10/17/2014,1:59 AM

## 2014-10-17 NOTE — Progress Notes (Signed)
Pt discharged home with wife.  Reviewed discharge instructions and education.  All questions answered, assessment unchanged from earlier.

## 2014-10-17 NOTE — Discharge Summary (Signed)
Physician Discharge Summary     Cardiologist: Bensimhon  Patient ID: Trevor Simpson MRN: [id number] DOB/AGE: 1933/10/04 81 y.o.  Admit date: 10/16/2014 Discharge date: 10/17/2014  Admission Diagnoses:  Chest pain  Discharge Diagnoses:  Active Problems:   Essential hypertension   Coronary atherosclerosis   Biventricular ICD (implantable cardioverter-defibrillator) in place   Cardiomyopathy, ischemic   Chronic systolic CHF (congestive heart failure)   Chronic anticoagulation   Epigastric pain   NSTEMI (non-ST elevated myocardial infarction)   Chronic kidney disease (CKD), stage III (moderate)   Discharged Condition: stable  Hospital Course:   81 y/o man with pmh ofend-stage ischemic cardiomyopathy, left branch block, paroxysmal atrial fibrillation, s/p[ BiV -ICD, EF 25% with mild MR/AI on guideline medical therapy doing very well and compensated came to ED with c/o epigastric pain started last night constant , no radiation , worse with breathing and relieved with hold breath. No associated DOE , orthopnea. [facility] and tried a new machine for arm exercise and thinks he may have pulled his muscle with that. In ED trop and ekg negative. Takes his meds regularly His wife is along with him in ED  Patient was admitted for observation.  Troponin 3 was 0.04 each time. He was started on heparin drip.  Chest pain was epigastric and pleuritic in nature and less likely to be cardiac in nature. Troponin elevation was likely from chronic kidney disease Was well compensated from a CHF standpoint. Improvement in serum creatinine overnight. He was given supplemental potassium because it was low.  He will follow-up in the CHF clinic.  The patient was seen by Dr. Meda Coffee who felt he was stable for DC home.   Consults: None  Significant Diagnostic Studies:  CHEST 2 VIEW  COMPARISON: 09/25/2013  FINDINGS: The heart size and mediastinal contours are  within normal limits. Transvenous pacemaker remains in stable position. Right middle lobe scarring is unchanged. No evidence of acute infiltrate or pulmonary edema. No evidence of pneumothorax or pleural effusion.  IMPRESSION: Stable right middle lobe scarring. No active disease.   Treatments: See above  Discharge Exam: Blood pressure 119/57, pulse 74, temperature 97.6 F (36.4 C), temperature source Oral, resp. rate 18, height 6' (1.829 m), weight 156 lb 14.4 oz (71.169 kg), SpO2 99 %.   Disposition: 01-Home or Self Care      Discharge Instructions    Diet - low sodium heart healthy    Complete by:  As directed      Discharge instructions    Complete by:  As directed   Monitor your weight every morning.  If you gain 3 pounds in 24 hours, or 5 pounds in a week, call the office for instructions.            Medication List    TAKE these medications        ALPRAZolam 0.25 MG tablet  Commonly known as:  XANAX  Take 0.375 mg by mouth at bedtime.     amiodarone 200 MG tablet  Commonly known as:  PACERONE  Take 100 mg by mouth daily.     apixaban 5 MG Tabs tablet  Commonly known as:  ELIQUIS  Take 1 tablet (5 mg total) by mouth 2 (two) times daily.     atorvastatin 20 MG tablet  Commonly known as:  LIPITOR  Take 20 mg by mouth daily at 6 PM.     calcium carbonate 600 MG Tabs tablet  Commonly known as:  OS-CAL  Take 600 mg by mouth daily with breakfast.     carvedilol 3.125 MG tablet  Commonly known as:  COREG  Take 1 tablet (3.125 mg total) by mouth at bedtime.     cetirizine 10 MG tablet  Commonly known as:  ZYRTEC  Take 10 mg by mouth daily.     digoxin 0.125 MG tablet  Commonly known as:  LANOXIN  Take 0.5 tablets (0.0625 mg total) by mouth daily.     Fish Oil 1200 MG Caps  Take 1,200 mg by mouth 2 (two) times daily.     fluticasone 50 MCG/ACT nasal spray  Commonly known as:  FLONASE  Place 1 spray into both nostrils daily.     furosemide 80 MG  tablet  Commonly known as:  LASIX  Take 1 tablet (80 mg total) by mouth daily.     levothyroxine 112 MCG tablet  Commonly known as:  SYNTHROID, LEVOTHROID  Take 112 mcg by mouth daily.     magnesium gluconate 500 MG tablet  Commonly known as:  MAGONATE  Take 500 mg by mouth daily.     multivitamin with minerals Tabs tablet  Take 1 tablet by mouth daily.     nitroGLYCERIN 0.4 MG SL tablet  Commonly known as:  NITROSTAT  Place 1 tablet (0.4 mg total) under the tongue every 5 (five) minutes as needed for chest pain.     omeprazole 20 MG capsule  Commonly known as:  PRILOSEC  Take 20 mg by mouth daily as needed (indigestion).     SAW PALMETTO PO  Take 1 capsule by mouth 2 (two) times daily.     SUPER B COMPLEX Tabs  Take 1 tablet by mouth daily.     tamsulosin 0.4 MG Caps capsule  Commonly known as:  FLOMAX  Take 1 capsule (0.4 mg total) by mouth daily after supper.     tiotropium 18 MCG inhalation capsule  Commonly known as:  SPIRIVA  Place 1 capsule (18 mcg total) into inhaler and inhale daily.     vitamin C 500 MG tablet  Commonly known as:  ASCORBIC ACID  Take 500 mg by mouth daily.       Follow-up Information    Follow up with Glori Bickers, MD.   Specialty:  Cardiology   Why:  The office scheduler will call you with your follow up appointment date and time   Contact information:   [place] [address] [phone]      Greater than 30 minutes was spent completing the patient's discharge.   SignedTarri Fuller 10/17/2014, 11:40 AM

## 2014-10-17 NOTE — Progress Notes (Signed)
Patient Name: Trevor Simpson Date of Encounter: 10/17/2014  Active Problems:   Essential hypertension   Coronary atherosclerosis   Biventricular ICD (implantable cardioverter-defibrillator) in place   Cardiomyopathy, ischemic   Chronic systolic CHF (congestive heart failure)   Epigastric pain   NSTEMI (non-ST elevated myocardial infarction)   Length of Stay: 0  SUBJECTIVE  Mild residual chest pain on inspiration, no SOB.   CURRENT MEDS . amiodarone  100 mg Oral Daily  . aspirin EC  81 mg Oral Daily  . atorvastatin  20 mg Oral q1800  . carvedilol  3.125 mg Oral QHS  . digoxin  0.0625 mg Oral Daily  . fluticasone  1 spray Each Nare Daily  . furosemide  80 mg Oral Daily  . levothyroxine  112 mcg Oral QAC breakfast  . pantoprazole  40 mg Oral Daily  . sodium chloride  3 mL Intravenous Q12H  . tamsulosin  0.4 mg Oral QPC supper  . tiotropium  18 mcg Inhalation Daily    OBJECTIVE  Filed Vitals:   10/17/14 0030 10/17/14 0157 10/17/14 0400 10/17/14 0757  BP: 142/61 145/73 106/42 119/57  Pulse: 70 72 77 74  Temp:  97.3 F (36.3 C) 97.8 F (36.6 C) 97.6 F (36.4 C)  TempSrc:  Oral Oral Oral  Resp: '13 23 28 18  '$ Height:  6' (1.829 m)    Weight:  156 lb 14.4 oz (71.169 kg)    SpO2: 98% 98% 98% 99%    Intake/Output Summary (Last 24 hours) at 10/17/14 1106 Last data filed at 10/17/14 1000  Gross per 24 hour  Intake    603 ml  Output    551 ml  Net     52 ml   Filed Weights   10/16/14 2017 10/17/14 0157  Weight: 156 lb (70.761 kg) 156 lb 14.4 oz (71.169 kg)    PHYSICAL EXAM  General: Pleasant, NAD. Neuro: Alert and oriented X 3. Moves all extremities spontaneously. Psych: Normal affect. HEENT:  Normal  Neck: Supple without bruits or JVD. Lungs:  Resp regular and unlabored, CTA. Heart: RRR no s3, s4, 3/6 systolic murmur. Abdomen: Soft, non-tender, non-distended, BS + x 4.  Extremities: No clubbing, cyanosis or edema. DP/PT/Radials 2+ and equal  bilaterally.  Accessory Clinical Findings  CBC  Recent Labs  10/16/14 2024 10/17/14 0753  WBC 7.4 6.1  HGB 15.4 14.8  HCT 47.0 45.0  MCV 96.7 97.0  PLT 122* 741*   Basic Metabolic Panel  Recent Labs  10/16/14 2024 10/17/14 0753  NA 136 137  K 4.4 3.3*  CL 96* 95*  CO2 31 35*  GLUCOSE 210* 124*  BUN 23* 20  CREATININE 1.63* 1.34*  CALCIUM 9.2 8.9    Recent Labs  10/17/14 0006 10/17/14 0132 10/17/14 0753  TROPONINI 0.04* 0.04* 0.04*   Radiology/Studies  Dg Chest 2 View  10/16/2014   CLINICAL DATA:  Chest pain and shortness of breath beginning yesterday. Coronary artery disease.  EXAM: CHEST  2 VIEW  COMPARISON:  09/25/2013  FINDINGS: The heart size and mediastinal contours are within normal limits. Transvenous pacemaker remains in stable position. Right middle lobe scarring is unchanged. No evidence of acute infiltrate or pulmonary edema. No evidence of pneumothorax or pleural effusion.  IMPRESSION: Stable right middle lobe scarring.  No active disease.   Electronically Signed   By: Earle Gell M.D.   On: 10/16/2014 20:57    TELE: BiV pacing     ASSESSMENT AND PLAN  Active Problems:  Essential hypertension  Coronary atherosclerosis  Biventricular ICD (implantable cardioverter-defibrillator) in place  Cardiomyopathy, ischemic  Chronic systolic CHF (congestive heart failure)  Epigastric pain Hypokalemia CKD with Cr at 1.6 baseline PAF  ASSESSMENT: Chest/epigastric pain pleuritic in nature and less likely to be cardiac given the nature of pain He is very well compensated from CHF standpoint CKD monitor Cr, improved overnight 1.6 -> 1.3 Elevated troponin with flat ternd most probably related to CKD.  Chronic combined CHF continue current meds   PLAN/DISCUSSION: 1. Discharge home on current meds, replace KCL 40 mEq prior to the discharge. Arrange for a follow up at the CHF clinic.    Signed, Dorothy Spark MD, [facility] 10/17/2014

## 2014-10-17 NOTE — Discharge Instructions (Signed)

## 2014-10-17 NOTE — H&P (Addendum)
Referring Physician: Dr. Doy Mince Primary Physician: Primary Cardiologist:Dr. Bensimhon Reason for Consultation: "chest pain"   HPI: 81 y/o man with pmh of  end-stage ischemic cardiomyopathy, left branch block, paroxysmal atrial fibrillation, s/p[ BiV -ICD, EF 25% with mild MR/AI on guideline medical therapy doing very well and compensated came to ED with c/o epigastric pain started last night constant , no radiation , worse with breathing and relieved with hold breath. No associated DOE , orthopnea. [facility] and tried a new machine for arm exercise and thinks he may have pulled his muscle with that. In ED trop and ekg negative. Takes his meds regularly His wife is along with him in ED    Review of Systems:     Cardiac Review of Systems: {Y] = yes '[ ]'$  = no  Chest Pain [    ]  Resting SOB [   ] Exertional SOB  [  ]  Orthopnea [  ]   Pedal Edema [   ]    Palpitations [  ] Syncope  [  ]   Presyncope [   ]  General Review of Systems: [Y] = yes [  ]=no Constitional: recent weight change [  ]; anorexia [  ]; fatigue [  ]; nausea [  ]; night sweats [  ]; fever [  ]; or chills [  ];                                                                     Eyes : blurred vision [  ]; diplopia [   ]; vision changes [  ];  Amaurosis fugax[  ]; Resp: cough [  ];  wheezing[  ];  hemoptysis[  ];  PND [  ];  GI:  gallstones[  ], vomiting[  ];  dysphagia[  ]; melena[  ];  hematochezia [  ]; heartburn[  ];   GU: kidney stones [  ]; hematuria[  ];   dysuria [  ];  nocturia[  ]; incontinence [  ];             Skin: rash, swelling[  ];, hair loss[  ];  peripheral edema[  ];  or itching[  ]; Musculosketetal: myalgias[  ];  joint swelling[  ];  joint erythema[  ];  joint pain[  ];  back pain[  ];  Heme/Lymph: bruising[  ];  bleeding[  ];  anemia[  ];  Neuro: TIA[  ];  headaches[  ];  stroke[  ];  vertigo[  ];  seizures[  ];   paresthesias[  ];  difficulty walking[   ];  Psych:depression[  ]; anxiety[  ];  Endocrine: diabetes[  ];  thyroid dysfunction[  ];  Other:  Past Medical History  Diagnosis Date  . Coronary artery disease     a. LHC (08/2012): Lmain: long, 20% stenosis distally LAD: mod calcified and mild dz prox. diff dz proximally 30%, first diagonal mild dz prox and small, Ramus intermediate brach lg bifurcates mid vessel prox. 90-95% stenosis LCx: relatively sml, rise to single marginal and contiine AV groove, 1st marg. diff 60-70% dz stenosis prox RCA: occ. prox. R-L coll, successful stent remus interm  branch BMS  . Other and unspecified hyperlipidemia   . Hypertension   . Sinoatrial node dysfunction   . PVD (peripheral vascular disease)     s/p renal artery stent 20-04  . AAA (abdominal aortic aneurysm)     repaired  . Systolic congestive heart failure     a. ICM b.   . Ischemic cardiomyopathy   . Carotid artery occlusion   . Atrial fibrillation   . GERD (gastroesophageal reflux disease)   . Hypothyroidism   . Automatic implantable cardioverter-defibrillator in situ   . Myocardial infarction   . HCAP (healthcare-associated pneumonia) 2014  . Stroke 2014    left sided affected no residual effects  . Arthritis     "left elbow and shoulder" (09/24/2013)  . Anxiety   . Colon polyp   . Congestive heart failure   . H/O blood clots   . Hyperlipidemia   . Pneumonia      (Not in a hospital admission) No current facility-administered medications on file prior to encounter.   Current Outpatient Prescriptions on File Prior to Encounter  Medication Sig Dispense Refill  . ALPRAZolam (XANAX) 0.25 MG tablet Take 0.375 mg by mouth at bedtime.    Marland Kitchen amiodarone (PACERONE) 200 MG tablet Take 100 mg by mouth daily.    Marland Kitchen apixaban (ELIQUIS) 5 MG TABS tablet Take 1 tablet (5 mg total) by mouth 2 (two) times daily. 60 tablet 6  . atorvastatin (LIPITOR) 20 MG tablet Take 20 mg by mouth daily at 6 PM.     . B Complex-C (SUPER B COMPLEX) TABS Take 1  tablet by mouth daily.     . calcium carbonate (OS-CAL) 600 MG TABS Take 600 mg by mouth daily with breakfast.     . carvedilol (COREG) 3.125 MG tablet Take 1 tablet (3.125 mg total) by mouth at bedtime. 30 tablet 3  . cetirizine (ZYRTEC) 10 MG tablet Take 10 mg by mouth daily.     . digoxin (LANOXIN) 0.125 MG tablet Take 0.5 tablets (0.0625 mg total) by mouth daily. 90 tablet 0  . furosemide (LASIX) 80 MG tablet Take 1 tablet (80 mg total) by mouth daily. 90 tablet 3  . levothyroxine (SYNTHROID, LEVOTHROID) 112 MCG tablet Take 112 mcg by mouth daily.    . magnesium gluconate (MAGONATE) 500 MG tablet Take 500 mg by mouth daily.     . Multiple Vitamin (MULTIVITAMIN WITH MINERALS) TABS Take 1 tablet by mouth daily.    . nitroGLYCERIN (NITROSTAT) 0.4 MG SL tablet Place 1 tablet (0.4 mg total) under the tongue every 5 (five) minutes as needed for chest pain. 25 tablet 3  . Omega-3 Fatty Acids (FISH OIL) 1200 MG CAPS Take 1,200 mg by mouth 2 (two) times daily.    Marland Kitchen omeprazole (PRILOSEC) 20 MG capsule Take 20 mg by mouth daily as needed (indigestion).     . Saw Palmetto, Serenoa repens, (SAW PALMETTO PO) Take 1 capsule by mouth 2 (two) times daily.    . tamsulosin (FLOMAX) 0.4 MG CAPS capsule Take 0.4 mg by mouth daily after supper.     . tiotropium (SPIRIVA) 18 MCG inhalation capsule Place 1 capsule (18 mcg total) into inhaler and inhale daily. 30 capsule 6  . vitamin C (ASCORBIC ACID) 500 MG tablet Take 500 mg by mouth daily.      . [DISCONTINUED] diltiazem (CARDIZEM CD) 120 MG 24 hr capsule           Infusions:    Allergies  Allergen Reactions  . Codeine     "tripped out on it"  . Morphine     "tripped out on it"    History   Social History  . Marital Status: Married    Spouse Name: N/A  . Number of Children: 4  . Years of Education: N/A   Occupational History  . Retired    Social History Main Topics  . Smoking status: Former Smoker -- 1.00 packs/day for 50 years    Types:  Cigarettes    Quit date: 05/17/1993  . Smokeless tobacco: Current User    Types: Chew  . Alcohol Use: No  . Drug Use: No  . Sexual Activity: Not Currently   Other Topics Concern  . Not on file   Social History Narrative    Family History  Problem Relation Age of Onset  . CAD Father   . Hypertension Father   . Heart disease Father   . CAD Brother   . Heart disease Brother   . Heart attack Brother   . Diabetes Brother   . Lung cancer Sister   . Heart disease Sister   . Hypertension Sister   . Hypertension Mother   . Hypertension Daughter   . Vascular Disease Daughter     Right Carotid  . Colon cancer Son   . Cancer Son     Colon  . Colon polyps Brother     x2    PHYSICAL EXAM: Filed Vitals:   10/17/14 0030  BP: 142/61  Pulse: 70  Temp:   Resp: 13    No intake or output data in the 24 hours ending 10/17/14 0117  General:  Well appearing. No respiratory difficulty HEENT: normal Neck: supple. no JVD. Carotids 2+ bilat; no bruits. No lymphadenopathy or thryomegaly appreciated. Cor: PMI nondisplaced. Regular rate & rhythm. No rubs, gallops or murmurs., Left ICD + Lungs: clear Abdomen: soft, nontender, nondistended. No hepatosplenomegaly. No bruits or masses. Good bowel sounds. Extremities: no cyanosis, clubbing, rash, edema Neuro: alert & oriented x 3, cranial nerves grossly intact. moves all 4 extremities w/o difficulty. Affect pleasant.  ECG:  Results for orders placed or performed during the hospital encounter of 10/16/14 (from the past 24 hour(s))  Basic metabolic panel     Status: Abnormal   Collection Time: 10/16/14  8:24 PM  Result Value Ref Range   Sodium 136 135 - 145 mmol/L   Potassium 4.4 3.5 - 5.1 mmol/L   Chloride 96 (L) 101 - 111 mmol/L   CO2 31 22 - 32 mmol/L   Glucose, Bld 210 (H) 65 - 99 mg/dL   BUN 23 (H) 6 - 20 mg/dL   Creatinine, Ser 1.63 (H) 0.61 - 1.24 mg/dL   Calcium 9.2 8.9 - 10.3 mg/dL   GFR calc non Af Amer 38 (L) >60 mL/min    GFR calc Af Amer 45 (L) >60 mL/min   Anion gap 9 5 - 15  CBC     Status: Abnormal   Collection Time: 10/16/14  8:24 PM  Result Value Ref Range   WBC 7.4 4.0 - 10.5 K/uL   RBC 4.86 4.22 - 5.81 MIL/uL   Hemoglobin 15.4 13.0 - 17.0 g/dL   HCT 47.0 39.0 - 52.0 %   MCV 96.7 78.0 - 100.0 fL   MCH 31.7 26.0 - 34.0 pg   MCHC 32.8 30.0 - 36.0 g/dL   RDW 13.3 11.5 - 15.5 %   Platelets 122 (L) 150 - 400 K/uL  Protime-INR - (order if Patient is taking Coumadin / Warfarin)     Status: Abnormal   Collection Time: 10/16/14  8:24 PM  Result Value Ref Range   Prothrombin Time 18.0 (H) 11.6 - 15.2 seconds   INR 1.48 0.00 - 1.49  I-Stat Troponin, ED (not at [facility])     Status: None   Collection Time: 10/16/14  8:53 PM  Result Value Ref Range   Troponin i, poc 0.03 0.00 - 0.08 ng/mL   Comment 3          Troponin I     Status: Abnormal   Collection Time: 10/17/14 12:06 AM  Result Value Ref Range   Troponin I 0.04 (H) <0.031 ng/mL   Dg Chest 2 View  10/16/2014   CLINICAL DATA:  Chest pain and shortness of breath beginning yesterday. Coronary artery disease.  EXAM: CHEST  2 VIEW  COMPARISON:  09/25/2013  FINDINGS: The heart size and mediastinal contours are within normal limits. Transvenous pacemaker remains in stable position. Right middle lobe scarring is unchanged. No evidence of acute infiltrate or pulmonary edema. No evidence of pneumothorax or pleural effusion.  IMPRESSION: Stable right middle lobe scarring.  No active disease.   Electronically Signed   By: Earle Gell M.D.   On: 10/16/2014 20:57    Active Problems:   Essential hypertension   Coronary atherosclerosis   Biventricular ICD (implantable cardioverter-defibrillator) in place   Cardiomyopathy, ischemic   Chronic systolic CHF (congestive heart failure)   Epigastric pain NSTEMI CKD with Cr at 1.6 baseline PAF ASSESSMENT: Chest/epigastric pain pleuritic in nature and less likely to be cardiac given the nature of pain He is very well  compensated from CHF standpoint CKD monitor Cr NSTEMI Chronic combined CHF continue current meds  PLAN/DISCUSSION: 1. Initial plan was to Cycle trop and if negative he can be discharged home with his current home meds to continue 2. But his second set of trop is barely positive at 0.04. 3. Will cycle trop further 4. Admit to SDU  5. Continue home meds 6. Heparin drip, hold eliquis for now, stress test vs LHC may have to wait 48 hours for LHC 7. Npo for now   Sulaiman Durenda Age, MD

## 2014-10-18 NOTE — Progress Notes (Signed)
Utilization review completed- post discharge 

## 2014-10-26 ENCOUNTER — Encounter: Payer: Self-pay | Admitting: Internal Medicine

## 2014-10-27 ENCOUNTER — Encounter: Payer: Self-pay | Admitting: Internal Medicine

## 2014-11-09 ENCOUNTER — Encounter: Payer: Self-pay | Admitting: Licensed Clinical Social Worker

## 2014-11-09 ENCOUNTER — Ambulatory Visit (HOSPITAL_COMMUNITY)
Admission: RE | Admit: 2014-11-09 | Discharge: 2014-11-09 | Disposition: A | Discharge status: Home / Self Care | Payer: Medicare HMO | Source: Ambulatory Visit | Attending: Cardiology | Admitting: Cardiology

## 2014-11-09 VITALS — BP 100/68 | HR 71 | Wt 157.5 lb

## 2014-11-09 DIAGNOSIS — I2510 Atherosclerotic heart disease of native coronary artery without angina pectoris: Secondary | ICD-10-CM | POA: Insufficient documentation

## 2014-11-09 DIAGNOSIS — Z79899 Other long term (current) drug therapy: Secondary | ICD-10-CM | POA: Diagnosis not present

## 2014-11-09 DIAGNOSIS — Z8249 Family history of ischemic heart disease and other diseases of the circulatory system: Secondary | ICD-10-CM | POA: Insufficient documentation

## 2014-11-09 DIAGNOSIS — I480 Paroxysmal atrial fibrillation: Secondary | ICD-10-CM | POA: Diagnosis not present

## 2014-11-09 DIAGNOSIS — Z95810 Presence of automatic (implantable) cardiac defibrillator: Secondary | ICD-10-CM | POA: Insufficient documentation

## 2014-11-09 DIAGNOSIS — Z833 Family history of diabetes mellitus: Secondary | ICD-10-CM | POA: Insufficient documentation

## 2014-11-09 DIAGNOSIS — J449 Chronic obstructive pulmonary disease, unspecified: Secondary | ICD-10-CM | POA: Diagnosis not present

## 2014-11-09 DIAGNOSIS — Z8673 Personal history of transient ischemic attack (TIA), and cerebral infarction without residual deficits: Secondary | ICD-10-CM | POA: Diagnosis not present

## 2014-11-09 DIAGNOSIS — Z885 Allergy status to narcotic agent status: Secondary | ICD-10-CM | POA: Diagnosis not present

## 2014-11-09 DIAGNOSIS — I255 Ischemic cardiomyopathy: Secondary | ICD-10-CM | POA: Insufficient documentation

## 2014-11-09 DIAGNOSIS — Z7902 Long term (current) use of antithrombotics/antiplatelets: Secondary | ICD-10-CM | POA: Diagnosis not present

## 2014-11-09 DIAGNOSIS — I10 Essential (primary) hypertension: Secondary | ICD-10-CM | POA: Insufficient documentation

## 2014-11-09 DIAGNOSIS — I5022 Chronic systolic (congestive) heart failure: Secondary | ICD-10-CM | POA: Diagnosis present

## 2014-11-09 LAB — BASIC METABOLIC PANEL
Anion gap: 10 (ref 5–15)
BUN: 22 mg/dL — ABNORMAL HIGH (ref 6–20)
CALCIUM: 9.5 mg/dL (ref 8.9–10.3)
CO2: 33 mmol/L — AB (ref 22–32)
CREATININE: 1.59 mg/dL — AB (ref 0.61–1.24)
Chloride: 96 mmol/L — ABNORMAL LOW (ref 101–111)
GFR calc non Af Amer: 40 mL/min — ABNORMAL LOW (ref >60)
GFR, EST AFRICAN AMERICAN: 46 mL/min — AB (ref >60)
GLUCOSE: 84 mg/dL (ref 65–99)
Potassium: 3.9 mmol/L (ref 3.5–5.1)
Sodium: 139 mmol/L (ref 135–145)

## 2014-11-09 MED ORDER — CARVEDILOL 3.125 MG PO TABS: 3.1250 mg | ORAL_TABLET | Freq: Two times a day (BID) | ORAL | Status: DC

## 2014-11-09 NOTE — Progress Notes (Signed)
CSW referred to assist with medication assistance for Eliqus. Patient reports he has numerous medications and struggles to make the co-pays. He states he has not yet explored any patient assistance programs. CSW discussed the extra help program and SHIP program for additional assistance and provided numbers for follow up. CSW also provided application for patient assistance program through [facility] for medication. Patient and family verbalize understanding of options and will follow up. CSW available if needed. Raquel Sarna, [place]

## 2014-11-09 NOTE — Progress Notes (Signed)
Patient ID: Trevor Simpson, male   DOB: [birth year], 81 y.o.   MRN: [id number]  PCP: Dr Maudie Mercury Pulmonology: Dr Chase Caller Cardiology: Dr Lovena Le  HPI: Trevor Simpson is a 81 y.o. year old with history of  COPD, symptomatic bradycardia, PAF, HTN, PAD, CVA, chronic systolic heart failure and ischemic cardiomyopathy (EF 20-25% echo 7/16)  s/p CRT-D on 09/26/13  LHC 08/2012  Left mainstem: The left main is long with 20% stenosis distally.  Left anterior descending (LAD): The left anterior descending artery is moderately calcified in the proximal vessel. There is diffuse disease in the proximal vessel up to 30%. The first diagonal is relatively small and has mild disease proximally. The ramus intermediate branch is a large branch that bifurcates in the mid vessel. The proximal vessel has a 90-95% stenosis.  Left circumflex (LCx): The left circumflex is relatively small. It gives rise to a single marginal branch and then continues in the AV groove. The first marginal branch has diffuse 60-70% stenosis proximally.  Right coronary artery (RCA): The right coronary is occluded proximally. There are right to right and left to right collaterals. Severe two-vessel obstructive coronary disease. Chronic total occlusion of the right coronary Successful stenting of the ramus intermediate branch with a bare-metal stent  He was treated for RUL pneumonia 12/14. He developed hemoptysis and his PCP stopped Xarelto and placed him on levaquin.  At that time he was referred to pulmonary. Dr Trevor Simpson Stopped levaquin and started cipro and clindamycin and coumadin. F/u CXR has shown persistent RUL opacity and there is a question if this might be asymmetric edema.  However, CT showed scarring pattern likely from prior PNA.  Admitted in 6/15 with low-output. RHC as below. Started on milrinone. In July 2015 underwent CRT-D upgrade Mean RA 11 PA 53/24  Mean PCWP 22 CI 1.5  Echo (6/15): EF 15%, moderate central Trevor, mild to moderately  decreased RV systolic function.   Milrinone stopped in September 2015 due to patient preference, despite borderline co-ox.  He presents today for HF follow up. At last visit we added carvedilol 3.125 qhs and also had him got to EP for device optimization. Rate turned up from 60 to 70.  Says he feels better. Wants to get back to the gym and work on the Nu-Step. Weight stable. Taking all meds. Working in garden. Appetite great. No edema, orthopnea, PND or CP. Maintaining NSR on amio. Has appt with Dr. Lovena Le next week.  Labs:  7/14 AST 27 ALT 21  11/14 K 3.8 Creatinine  1.41  12/14 Pro BNP 1100 4/14: K 3.9, creatinine 1.4, BUN 22 6/15: K 4.1, creatinine 1.2 7/15: K 3.7, creatinine 1.36, HCT 43.7, co-ox 66.5% 8/15: digoxin 1.2, co-ox 55%, K 4.0, creatinine 1.36 9/15: co-ox 56.8% 11/15 K 3.8, creatinine 1.25 10/17/14: K 3.3 creatinine 1.34   SH: Lives at home with wife  FH: Father, Brother - CAD HTN, DM        Sister - lung cancer    ROS: All systems reviewed and negative except as per HPI.   Current Outpatient Prescriptions  Medication Sig Dispense Refill  . ALPRAZolam (XANAX) 0.25 MG tablet Take 0.375 mg by mouth at bedtime.    Marland Kitchen amiodarone (PACERONE) 200 MG tablet Take 100 mg by mouth daily.    Marland Kitchen apixaban (ELIQUIS) 5 MG TABS tablet Take 1 tablet (5 mg total) by mouth 2 (two) times daily. 60 tablet 6  . atorvastatin (LIPITOR) 20 MG tablet Take 20 mg by mouth  daily at 6 PM.     . B Complex-C (SUPER B COMPLEX) TABS Take 1 tablet by mouth daily.     . calcium carbonate (OS-CAL) 600 MG TABS Take 600 mg by mouth daily with breakfast.     . carvedilol (COREG) 3.125 MG tablet Take 1 tablet (3.125 mg total) by mouth at bedtime. 30 tablet 3  . cetirizine (ZYRTEC) 10 MG tablet Take 10 mg by mouth daily.     . digoxin (LANOXIN) 0.125 MG tablet Take 0.5 tablets (0.0625 mg total) by mouth daily. 90 tablet 0  . fluticasone (FLONASE) 50 MCG/ACT nasal spray Place 1 spray into both nostrils daily.      . furosemide (LASIX) 80 MG tablet Take 1 tablet (80 mg total) by mouth daily. 90 tablet 3  . levothyroxine (SYNTHROID, LEVOTHROID) 112 MCG tablet Take 112 mcg by mouth daily.    . magnesium gluconate (MAGONATE) 500 MG tablet Take 500 mg by mouth daily.     . Multiple Vitamin (MULTIVITAMIN WITH MINERALS) TABS Take 1 tablet by mouth daily.    . nitroGLYCERIN (NITROSTAT) 0.4 MG SL tablet Place 1 tablet (0.4 mg total) under the tongue every 5 (five) minutes as needed for chest pain. 25 tablet 3  . Omega-3 Fatty Acids (FISH OIL) 1200 MG CAPS Take 1,200 mg by mouth 2 (two) times daily.    Marland Kitchen omeprazole (PRILOSEC) 20 MG capsule Take 20 mg by mouth daily as needed (indigestion).     . Saw Palmetto, Serenoa repens, (SAW PALMETTO PO) Take 1 capsule by mouth 2 (two) times daily.    . tamsulosin (FLOMAX) 0.4 MG CAPS capsule Take 1 capsule (0.4 mg total) by mouth daily after supper. 30 capsule   . tiotropium (SPIRIVA) 18 MCG inhalation capsule Place 1 capsule (18 mcg total) into inhaler and inhale daily. 30 capsule 6  . vitamin C (ASCORBIC ACID) 500 MG tablet Take 500 mg by mouth daily.      . [DISCONTINUED] diltiazem (CARDIZEM CD) 120 MG 24 hr capsule      No current facility-administered medications for this encounter.     Allergies  Allergen Reactions  . Codeine     "tripped out on it"  . Morphine     "tripped out on it"    Filed Vitals:   11/09/14 1123  BP: 100/68  Pulse: 71  Weight: 157 lb 8 oz (71.442 kg)  SpO2: 100%    PHYSICAL EXAM: General:  NAD. No respiratory difficulty. Family present. HEENT: normal Neck: supple. JVP flat; Carotids 2+ bilat; no bruits. No lymphadenopathy or thryomegaly appreciated. R neck scar Cor: Distant heart sounds. Regular rate & rhythm.  2/6 HSM apex.   Lungs: CTA Abdomen: soft, nontender, nondistended. No hepatosplenomegaly. No bruits or masses. Good bowel sounds. Extremities: no cyanosis, clubbing, rash, no edema Neuro: alert & oriented x 3, cranial  nerves grossly intact. moves all 4 extremities w/o difficulty. Affect pleasant.   ASSESSMENT & PLAN: 1. Chronic Systolic Heart Failure: Ischemic cardiomyopathy s/p CRT-D; Echo 6/15 with EF 15%, moderate Trevor, mild to moderately decreased RV systolic function. Echo 7/16 EF 20-25% mild Trevor/AI. RV mild dysfunction  RHC in 6/15 showed CI 1.5 so patient was started on milrinone.  Milrinone stopped in 9/15. - Improved after CRT optimization.  NYHA II-III symptoms. Weight stable. Euvolemic.  - Continue current regimen with increase carvedilol to 3.125 bid - Does not tolerate addition of losartan d/t hypotension.No ivabradine as HR already under 70.  - Reinforced the  need and importance of daily weights, a low sodium diet, and fluid restriction (less than 2 L a day). Instructed to call the HF clinic if weight increases more than 3 lbs overnight or 5 lbs in a week.  2. PAF: S/P DC-CV 09/2012.  No bleeding episodes on eliquis; doing much better on low-dose amiodarone per Dr. Lovena Le.  -Continue Eliquis 5 bid. 3. CAD: Extensive disease burden. No current signs/sx of ischemia. Continue statin. Lipids per PCP.  No aspirin given stable CAD and use of Eliquis   Bensimhon, Daniel,MD 11:31 AM

## 2014-11-09 NOTE — Patient Instructions (Signed)
INCREASE Coreg to 3.125 mg , one tab twice a day  Labs today  Your physician recommends that you schedule a follow-up appointment in: 3 months  Do the following things EVERYDAY: 1) Weigh yourself in the morning before breakfast. Write it down and keep it in a log. 2) Take your medicines as prescribed 3) Eat low salt foods-Limit salt (sodium) to 2000 mg per day.  4) Stay as active as you can everyday 5) Limit all fluids for the day to less than 2 liters 6)

## 2014-12-14 ENCOUNTER — Encounter: Payer: Self-pay | Admitting: Internal Medicine

## 2014-12-28 ENCOUNTER — Telehealth (HOSPITAL_COMMUNITY): Payer: Self-pay

## 2014-12-28 NOTE — Telephone Encounter (Signed)
Returned patients phone call.  Medication Samples have been provided to the patient.  Drug name: eliquist 5 mg Qty:  #56 tablets LOT: [id number] Exp.Date: Jan 2019  The patient has been instructed regarding the correct time, dose, and frequency of taking this medication, including desired effects and most common side effects.   Lorrain Rivers 3:07 PM 12/28/2014

## 2014-12-31 DIAGNOSIS — Z23 Encounter for immunization: Secondary | ICD-10-CM | POA: Diagnosis not present

## 2015-01-10 ENCOUNTER — Ambulatory Visit (INDEPENDENT_AMBULATORY_CARE_PROVIDER_SITE_OTHER): Payer: Medicare HMO | Admitting: *Deleted

## 2015-01-10 ENCOUNTER — Encounter: Payer: Self-pay | Admitting: Internal Medicine

## 2015-01-10 ENCOUNTER — Encounter: Payer: Medicare HMO | Admitting: *Deleted

## 2015-01-10 DIAGNOSIS — I5021 Acute systolic (congestive) heart failure: Secondary | ICD-10-CM | POA: Diagnosis not present

## 2015-01-10 DIAGNOSIS — I255 Ischemic cardiomyopathy: Secondary | ICD-10-CM

## 2015-01-14 LAB — CUP PACEART INCLINIC DEVICE CHECK
Battery Remaining Longevity: 44.4
Date Time Interrogation Session: 20161024132606
HighPow Impedance: 72 Ohm
Implantable Lead Implant Date: 20150709
Implantable Lead Implant Date: 20150709
Implantable Lead Location: 753858
Implantable Lead Model: 7122
Lead Channel Impedance Value: 775 Ohm
Lead Channel Pacing Threshold Amplitude: 0.875 V
Lead Channel Pacing Threshold Pulse Width: 0.5 ms
Lead Channel Pacing Threshold Pulse Width: 0.9 ms
Lead Channel Sensing Intrinsic Amplitude: 11.8 mV
Lead Channel Setting Pacing Amplitude: 2 V
Lead Channel Setting Pacing Amplitude: 3.125
Lead Channel Setting Pacing Amplitude: 3.25 V
Lead Channel Setting Pacing Pulse Width: 0.5 ms
Lead Channel Setting Pacing Pulse Width: 0.5 ms
Lead Channel Setting Sensing Sensitivity: 0.5 mV
MDC IDC LEAD IMPLANT DT: 20110516
MDC IDC LEAD LOCATION: 753859
MDC IDC LEAD LOCATION: 753860
MDC IDC MSMT LEADCHNL LV PACING THRESHOLD AMPLITUDE: 2.125 V
MDC IDC MSMT LEADCHNL LV PACING THRESHOLD PULSEWIDTH: 0.5 ms
MDC IDC MSMT LEADCHNL RA IMPEDANCE VALUE: 387.5 Ohm
MDC IDC MSMT LEADCHNL RA PACING THRESHOLD AMPLITUDE: 1.75 V
MDC IDC MSMT LEADCHNL RA SENSING INTR AMPL: 4.3 mV
MDC IDC MSMT LEADCHNL RV IMPEDANCE VALUE: 475 Ohm
MDC IDC PG SERIAL: 7201810
MDC IDC STAT BRADY RA PERCENT PACED: 93 %
MDC IDC STAT BRADY RV PERCENT PACED: 98 %

## 2015-01-14 NOTE — Progress Notes (Signed)
CRT-D device check in office. Thresholds and sensing consistent with previous device measurements. Lead impedance trends stable over time. (62) mode switch episodes recorded (2.4%)---max dur. 11hrs, Max A 640, Max V 138, last 10/24 + Amio/Eliquis. No ventricular arrhythmia episodes recorded. Patient bi-ventricularly pacing 98% of the time. Device programmed with appropriate safety margins. Heart failure diagnostics reviewed and trends are stable for patient. Vibratory alerts demonstrated for patient. No changes made this session. Estimated longevity 3.7-4 years.  Patient will follow up with GT on 04/13/15 @ 1130 .

## 2015-01-27 ENCOUNTER — Other Ambulatory Visit (HOSPITAL_COMMUNITY): Payer: Self-pay | Admitting: *Deleted

## 2015-01-27 MED ORDER — DIGOXIN 125 MCG PO TABS: 0.0625 mg | ORAL_TABLET | Freq: Every day | ORAL | Status: DC

## 2015-01-27 NOTE — Telephone Encounter (Signed)
Medication Samples have been provided to the patient.  Drug name: Eliquis '5mg'$    Qty: 21  LOT: [id number]  Exp.Date: 01/19  The patient has been instructed regarding the correct time, dose, and frequency of taking this medication, including desired effects and most common side effects.   Philemon Kingdom D 2:55 PM 01/27/2015

## 2015-02-09 DIAGNOSIS — Z125 Encounter for screening for malignant neoplasm of prostate: Secondary | ICD-10-CM | POA: Diagnosis not present

## 2015-02-09 DIAGNOSIS — E119 Type 2 diabetes mellitus without complications: Secondary | ICD-10-CM | POA: Diagnosis not present

## 2015-02-09 DIAGNOSIS — E039 Hypothyroidism, unspecified: Secondary | ICD-10-CM | POA: Diagnosis not present

## 2015-02-09 DIAGNOSIS — I10 Essential (primary) hypertension: Secondary | ICD-10-CM | POA: Diagnosis not present

## 2015-02-15 DIAGNOSIS — E039 Hypothyroidism, unspecified: Secondary | ICD-10-CM | POA: Diagnosis not present

## 2015-02-15 DIAGNOSIS — I10 Essential (primary) hypertension: Secondary | ICD-10-CM | POA: Diagnosis not present

## 2015-02-15 DIAGNOSIS — E7800 Pure hypercholesterolemia, unspecified: Secondary | ICD-10-CM | POA: Diagnosis not present

## 2015-02-15 DIAGNOSIS — E119 Type 2 diabetes mellitus without complications: Secondary | ICD-10-CM | POA: Diagnosis not present

## 2015-03-03 ENCOUNTER — Ambulatory Visit (HOSPITAL_COMMUNITY)
Admission: RE | Admit: 2015-03-03 | Discharge: 2015-03-03 | Disposition: A | Discharge status: Home / Self Care | Payer: Medicare HMO | Source: Ambulatory Visit | Attending: Internal Medicine | Admitting: Internal Medicine

## 2015-03-03 VITALS — BP 118/66 | HR 64 | Wt 154.5 lb

## 2015-03-03 DIAGNOSIS — I2510 Atherosclerotic heart disease of native coronary artery without angina pectoris: Secondary | ICD-10-CM | POA: Insufficient documentation

## 2015-03-03 DIAGNOSIS — I5022 Chronic systolic (congestive) heart failure: Secondary | ICD-10-CM

## 2015-03-03 DIAGNOSIS — I480 Paroxysmal atrial fibrillation: Secondary | ICD-10-CM

## 2015-03-03 LAB — BASIC METABOLIC PANEL
Anion gap: 9 (ref 5–15)
BUN: 23 mg/dL — AB (ref 6–20)
CALCIUM: 9.3 mg/dL (ref 8.9–10.3)
CO2: 31 mmol/L (ref 22–32)
Chloride: 98 mmol/L — ABNORMAL LOW (ref 101–111)
Creatinine, Ser: 1.61 mg/dL — ABNORMAL HIGH (ref 0.61–1.24)
GFR calc Af Amer: 45 mL/min — ABNORMAL LOW (ref >60)
GFR calc non Af Amer: 39 mL/min — ABNORMAL LOW (ref >60)
GLUCOSE: 83 mg/dL (ref 65–99)
Potassium: 3.6 mmol/L (ref 3.5–5.1)
Sodium: 138 mmol/L (ref 135–145)

## 2015-03-03 LAB — DIGOXIN LEVEL: Digoxin Level: 0.6 ng/mL — ABNORMAL LOW (ref 0.8–2.0)

## 2015-03-03 NOTE — Patient Instructions (Signed)
Routine lab work today. (bmet dig) Will notify you of abnormal results  FOLLOW UP: 4 months   Do the following things EVERYDAY: 1) Weigh yourself in the morning before breakfast. Write it down and keep it in a log. 2) Take your medicines as prescribed 3) Eat low salt foods-Limit salt (sodium) to 2000 mg per day.  4) Stay as active as you can everyday 5) Limit all fluids for the day to less than 2 liters

## 2015-03-03 NOTE — Progress Notes (Signed)
Patient ID: Trevor Simpson, male   DOB: Apr 16, 1933, 81 y.o.   MRN: [id number]    Advanced Heart Failure Clinic Note   PCP: Dr Maudie Mercury Pulmonology: Dr Chase Caller Cardiology: Dr Lovena Le  HPI: Trevor Simpson is a 81 y.o. year old with history of  COPD, symptomatic bradycardia, PAF, HTN, PAD, CVA, chronic systolic heart failure and ischemic cardiomyopathy (EF 20-25% echo 7/16)  s/p CRT-D on 09/26/13  LHC 08/2012  Left mainstem: The left main is long with 20% stenosis distally.  Left anterior descending (LAD): The left anterior descending artery is moderately calcified in the proximal vessel. There is diffuse disease in the proximal vessel up to 30%. The first diagonal is relatively small and has mild disease proximally. The ramus intermediate branch is a large branch that bifurcates in the mid vessel. The proximal vessel has a 90-95% stenosis.  Left circumflex (LCx): The left circumflex is relatively small. It gives rise to a single marginal branch and then continues in the AV groove. The first marginal branch has diffuse 60-70% stenosis proximally.  Right coronary artery (RCA): The right coronary is occluded proximally. There are right to right and left to right collaterals. Severe two-vessel obstructive coronary disease. Chronic total occlusion of the right coronary Successful stenting of the ramus intermediate branch with a bare-metal stent  He was treated for RUL pneumonia 12/14. He developed hemoptysis and his PCP stopped Xarelto and placed him on levaquin.  At that time he was referred to pulmonary. [name] Stopped levaquin and started cipro and clindamycin and coumadin. F/u CXR has shown persistent RUL opacity and there is a question if this might be asymmetric edema.  However, CT showed scarring pattern likely from prior PNA.  Admitted in 6/15 with low-output. RHC as below. Started on milrinone. In July 2015 underwent CRT-D upgrade Mean RA 11 PA 53/24  Mean PCWP 22 CI 1.5  Echo (6/15): EF 15%,  moderate central Trevor, mild to moderately decreased RV systolic function.   Milrinone stopped in September 2015 due to patient preference, despite borderline co-ox.  He presents for regular follow up. Weight at home 156 and stable. Overall doing well. Has good and bad days. Does have some SOB when he "overdose" it. Can walk around a grocery store and do ADLs without difficulty. Still going to the gym and riding the Bikes for a total of 6-6.5 miles twice a week. Watching salt and fluid intake.  Taking all meds.  No edema, orthopnea, PND or CP. Maintaining NSR on amio.   Labs:  7/14 AST 27 ALT 21  11/14 K 3.8 Creatinine  1.41  12/14 Pro BNP 1100 4/14: K 3.9, creatinine 1.4, BUN 22 6/15: K 4.1, creatinine 1.2 7/15: K 3.7, creatinine 1.36, HCT 43.7, co-ox 66.5% 8/15: digoxin 1.2, co-ox 55%, K 4.0, creatinine 1.36 9/15: co-ox 56.8% 11/15 K 3.8, creatinine 1.25 10/17/14: K 3.3 creatinine 1.34   SH: Lives at home with wife  FH: Father, Brother - CAD HTN, DM        Sister - lung cancer    ROS: All systems reviewed and negative except as per HPI.   Current Outpatient Prescriptions  Medication Sig Dispense Refill  . ALPRAZolam (XANAX) 0.25 MG tablet Take 0.375 mg by mouth at bedtime.    Marland Kitchen amiodarone (PACERONE) 200 MG tablet Take 100 mg by mouth daily.    Marland Kitchen apixaban (ELIQUIS) 5 MG TABS tablet Take 1 tablet (5 mg total) by mouth 2 (two) times daily. 60 tablet 6  .  atorvastatin (LIPITOR) 20 MG tablet Take 20 mg by mouth daily at 6 PM.     . B Complex-C (SUPER B COMPLEX) TABS Take 1 tablet by mouth daily.     . calcium carbonate (OS-CAL) 600 MG TABS Take 600 mg by mouth daily with breakfast.     . carvedilol (COREG) 3.125 MG tablet Take 1 tablet (3.125 mg total) by mouth 2 (two) times daily with a meal. 60 tablet 3  . cetirizine (ZYRTEC) 10 MG tablet Take 10 mg by mouth daily.     . digoxin (LANOXIN) 0.125 MG tablet Take 0.5 tablets (0.0625 mg total) by mouth daily. 90 tablet 3  . fluticasone  (FLONASE) 50 MCG/ACT nasal spray Place 1 spray into both nostrils daily.     . furosemide (LASIX) 80 MG tablet Take 1 tablet (80 mg total) by mouth daily. 90 tablet 3  . levothyroxine (SYNTHROID, LEVOTHROID) 112 MCG tablet Take 112 mcg by mouth daily.    . magnesium gluconate (MAGONATE) 500 MG tablet Take 500 mg by mouth daily.     . Multiple Vitamin (MULTIVITAMIN WITH MINERALS) TABS Take 1 tablet by mouth daily.    . Omega-3 Fatty Acids (FISH OIL) 1200 MG CAPS Take 1,200 mg by mouth 2 (two) times daily.    Marland Kitchen omeprazole (PRILOSEC) 20 MG capsule Take 20 mg by mouth daily as needed (indigestion).     . tamsulosin (FLOMAX) 0.4 MG CAPS capsule Take 1 capsule (0.4 mg total) by mouth daily after supper. 30 capsule   . tiotropium (SPIRIVA) 18 MCG inhalation capsule Place 1 capsule (18 mcg total) into inhaler and inhale daily. 30 capsule 6  . vitamin C (ASCORBIC ACID) 500 MG tablet Take 500 mg by mouth daily.      . nitroGLYCERIN (NITROSTAT) 0.4 MG SL tablet Place 1 tablet (0.4 mg total) under the tongue every 5 (five) minutes as needed for chest pain. (Patient not taking: Reported on 03/03/2015) 25 tablet 3  . Saw Palmetto, Serenoa repens, (SAW PALMETTO PO) Take 1 capsule by mouth 2 (two) times daily.    . [DISCONTINUED] diltiazem (CARDIZEM CD) 120 MG 24 hr capsule      No current facility-administered medications for this encounter.     Allergies  Allergen Reactions  . Codeine     "tripped out on it"  . Morphine     "tripped out on it"    Filed Vitals:   03/03/15 1125  BP: 118/66  Pulse: 64  Weight: 154 lb 8 oz (70.081 kg)  SpO2: 97%    PHYSICAL EXAM: General:  NAD. No respiratory difficulty. Family present. HEENT: normal Neck: supple. JVP flat; Carotids 2+ bilat; no bruits. No lymphadenopathy or thryomegaly appreciated. R neck scar Cor: Distant heart sounds. Regular rate & rhythm.  2/6 HSM apex.   Lungs: CTA Abdomen: soft, NT, ND, no HSM. No bruits or masses. +BS  Extremities: no  cyanosis, clubbing, rash, no edema Neuro: alert & oriented x 3, cranial nerves grossly intact. moves all 4 extremities w/o difficulty. Affect pleasant.   ASSESSMENT & PLAN: 1. Chronic Systolic Heart Failure: Ischemic cardiomyopathy s/p CRT-D; Echo 6/15 with EF 15%, moderate Trevor, mild to moderately decreased RV systolic function. Echo 7/16 EF 20-25% mild Trevor/AI. RV mild dysfunction  RHC in 6/15 showed CI 1.5 so patient was started on milrinone.  Milrinone stopped in 11/2013. - Improved after CRT optimization.  NYHA II-III symptoms. Weight stable. Volume status stable on exam..  - Continue carvedilol 3.125 bid -  Does not tolerate addition of ARB with hypotension. No ivabradine with HR < 70.  - Reinforced the need and importance of daily weights, a low Na diet, and fluid restriction (less than 2 L a day). - Encouraged to call the HF clinic if weight increases more than 3 lbs overnight or 5 lbs in a week.  - BMET and dig level today. Follow up 4 months with Echo.  2. PAF: S/P DC-CV 09/2012.   - Continue Eliquis 5 bid. No bleeding Stable on low-dose amiodarone per Dr. Lovena Le.  3. CAD: Extensive disease burden.  - No current signs/sx of ischemia. No CP - Continue statin. Lipids per PCP.   - No aspirin given stable CAD and use of Eliquis  Shirley Friar, PA-C 11:51 AM   Patient seen and examined with Oda Kilts, PA-C. We discussed all aspects of the encounter. I agree with the assessment and plan as stated above.   He is doing very well after CRT despite severe LV dysfunction and previous need for home inotropes (off for over a year now). Intolerant of further med titration. Will continue current regimen. Remains in NSR. Continue Eliquis.   Breasia Karges,MD 11:23 PM

## 2015-03-28 ENCOUNTER — Ambulatory Visit: Payer: Medicare HMO | Admitting: Adult Health

## 2015-04-11 ENCOUNTER — Encounter: Payer: Self-pay | Admitting: Adult Health

## 2015-04-11 ENCOUNTER — Ambulatory Visit (INDEPENDENT_AMBULATORY_CARE_PROVIDER_SITE_OTHER): Payer: Medicare HMO | Admitting: Adult Health

## 2015-04-11 VITALS — BP 126/80 | HR 68 | Temp 97.6°F | Ht 70.0 in | Wt 163.0 lb

## 2015-04-11 DIAGNOSIS — J439 Emphysema, unspecified: Secondary | ICD-10-CM | POA: Diagnosis not present

## 2015-04-11 DIAGNOSIS — Z23 Encounter for immunization: Secondary | ICD-10-CM

## 2015-04-11 NOTE — Assessment & Plan Note (Signed)
Compensated -no flare off spiriva  Will monitor -may need to add atrovent   Plan  May stay off Spiriva for now , if breathing is getting worse , we can look at nebs -call our office back  Prevnar vaccine today  follow up Dr. Chase Caller in 6 months and As needed

## 2015-04-11 NOTE — Progress Notes (Signed)
Subjective:    Patient ID: Trevor Simpson, male    DOB: 01-Jul-1933, 82 y.o.   MRN: [id number]  HPI 82 yo male former smoker with COPD/Emyphysema   TEST  pulmonary function tests this was first 2015 this is normal spirometry and lung volumes but DLCO is reduced at 53% CT chest 02/2014 >Stable chronic lung disease with emphysema and scarring. 2. No suspicious pulmonary nodules or acute findings.   04/11/2015 Follow up : COPD /Emphysema Returns for 6 month follow up  Not taking spiriva , says he can not afford .  We discussed atrovent nebs as less expensive option, he declines at this time.  Goes to [facility] to exercise.  Pt c/o SOB with exertion, occasional prod cough with clear mucus, and sinus drainage/pressure on/off. no fever or discolored mucus . Overall feels he is doing okay.  Denies any chest tightness/congestion, wheezing, fever, nausea or vomiting.  Flu and PVX are utd.  Discussed Prevnar vaccine , he agrees.       Past Medical History  Diagnosis Date  . Coronary artery disease     a. LHC (08/2012): Lmain: long, 20% stenosis distally LAD: mod calcified and mild dz prox. diff dz proximally 30%, first diagonal mild dz prox and small, Ramus intermediate brach lg bifurcates mid vessel prox. 90-95% stenosis LCx: relatively sml, rise to single marginal and contiine AV groove, 1st marg. diff 60-70% dz stenosis prox RCA: occ. prox. R-L coll, successful stent remus interm branch BMS  . Other and unspecified hyperlipidemia   . Hypertension   . Sinoatrial node dysfunction (HCC)   . PVD (peripheral vascular disease) ([place])     s/p renal artery stent 20-04  . AAA (abdominal aortic aneurysm) ([place])     repaired  . Systolic congestive heart failure ([place])     a. ICM b.   . Ischemic cardiomyopathy   . Carotid artery occlusion   . Atrial fibrillation ([place])   . GERD (gastroesophageal reflux disease)   . Hypothyroidism   . Automatic implantable cardioverter-defibrillator in situ   .  Myocardial infarction ([place])   . HCAP (healthcare-associated pneumonia) 2014  . Stroke [facility]) 2014    left sided affected no residual effects  . Arthritis     "left elbow and shoulder" (09/24/2013)  . Anxiety   . Colon polyp   . Congestive heart failure ([place])   . H/O blood clots   . Hyperlipidemia   . Pneumonia    Current Outpatient Prescriptions on File Prior to Visit  Medication Sig Dispense Refill  . ALPRAZolam (XANAX) 0.25 MG tablet Take 0.375 mg by mouth at bedtime.    Marland Kitchen amiodarone (PACERONE) 200 MG tablet Take 100 mg by mouth daily.    Marland Kitchen apixaban (ELIQUIS) 5 MG TABS tablet Take 1 tablet (5 mg total) by mouth 2 (two) times daily. 60 tablet 6  . atorvastatin (LIPITOR) 20 MG tablet Take 20 mg by mouth daily at 6 PM.     . B Complex-C (SUPER B COMPLEX) TABS Take 1 tablet by mouth daily.     . calcium carbonate (OS-CAL) 600 MG TABS Take 600 mg by mouth daily with breakfast.     . carvedilol (COREG) 3.125 MG tablet Take 1 tablet (3.125 mg total) by mouth 2 (two) times daily with a meal. 60 tablet 3  . cetirizine (ZYRTEC) 10 MG tablet Take 10 mg by mouth daily.     . digoxin (LANOXIN) 0.125 MG tablet Take 0.5 tablets (0.0625 mg total)  by mouth daily. 90 tablet 3  . fluticasone (FLONASE) 50 MCG/ACT nasal spray Place 1 spray into both nostrils daily. Reported on 04/11/2015    . furosemide (LASIX) 80 MG tablet Take 1 tablet (80 mg total) by mouth daily. 90 tablet 3  . levothyroxine (SYNTHROID, LEVOTHROID) 112 MCG tablet Take 112 mcg by mouth daily.    . magnesium gluconate (MAGONATE) 500 MG tablet Take 500 mg by mouth daily.     . Multiple Vitamin (MULTIVITAMIN WITH MINERALS) TABS Take 1 tablet by mouth daily.    . nitroGLYCERIN (NITROSTAT) 0.4 MG SL tablet Place 1 tablet (0.4 mg total) under the tongue every 5 (five) minutes as needed for chest pain. 25 tablet 3  . Omega-3 Fatty Acids (FISH OIL) 1200 MG CAPS Take 1,200 mg by mouth 2 (two) times daily.    Marland Kitchen omeprazole (PRILOSEC) 20 MG capsule  Take 20 mg by mouth daily as needed (indigestion).     . Saw Palmetto, Serenoa repens, (SAW PALMETTO PO) Take 1 capsule by mouth 2 (two) times daily.    . vitamin C (ASCORBIC ACID) 500 MG tablet Take 500 mg by mouth daily.      . tamsulosin (FLOMAX) 0.4 MG CAPS capsule Take 1 capsule (0.4 mg total) by mouth daily after supper. (Patient not taking: Reported on 04/11/2015) 30 capsule   . tiotropium (SPIRIVA) 18 MCG inhalation capsule Place 1 capsule (18 mcg total) into inhaler and inhale daily. (Patient not taking: Reported on 04/11/2015) 30 capsule 6  . [DISCONTINUED] diltiazem (CARDIZEM CD) 120 MG 24 hr capsule      No current facility-administered medications on file prior to visit.      Review of Systems    Constitutional:   No  weight loss, night sweats,  Fevers, chills,  +fatigue, or  lassitude.  HEENT:   No headaches,  Difficulty swallowing,  Tooth/dental problems, or  Sore throat,                No sneezing, itching, ear ache, nasal congestion, post nasal drip,   CV:  No chest pain,  Orthopnea, PND, swelling in lower extremities, anasarca, dizziness, palpitations, syncope.   GI  No heartburn, indigestion, abdominal pain, nausea, vomiting, diarrhea, change in bowel habits, loss of appetite, bloody stools.   Resp:   No chest wall deformity  Skin: no rash or lesions.  GU: no dysuria, change in color of urine, no urgency or frequency.  No flank pain, no hematuria   MS:  No joint pain or swelling.  No decreased range of motion.  No back pain.  Psych:  No change in mood or affect. No depression or anxiety.  No memory loss.      Objective:   Physical Exam    Filed Vitals:   04/11/15 1052  BP: 126/80  Pulse: 68  Temp: 97.6 F (36.4 C)  TempSrc: Oral  Height: '5\' 10"'$  (1.778 m)  Weight: 163 lb (73.936 kg)  SpO2: 93%    GEN: A/Ox3; pleasant , NAD, thin   HEENT:  /AT,  EACs-clear, TMs-wnl, NOSE-clear, THROAT-clear, no lesions, no postnasal drip or exudate noted. Poor  dentition   NECK:  Supple w/ fair ROM; no JVD; normal carotid impulses w/o bruits; no thyromegaly or nodules palpated; no lymphadenopathy.  RESP  Decreaesd BS in bases ,no accessory muscle use, no dullness to percussion  CARD:  RRR, no m/r/g  , no peripheral edema, pulses intact, no cyanosis or clubbing.  GI:   Soft &  nt; nml bowel sounds; no organomegaly or masses detected.  Musco: Warm bil, no deformities or joint swelling noted.   Neuro: alert, no focal deficits noted.    Skin: Warm, no lesions or rashes       Assessment & Plan:

## 2015-04-11 NOTE — Addendum Note (Signed)
Addended by: Osa Craver on: 04/11/2015 12:02 PM   Modules accepted: Orders

## 2015-04-11 NOTE — Patient Instructions (Signed)
May stay off Spiriva for now , if breathing is getting worse , we can look at nebs -call our office back  Prevnar vaccine today  follow up Dr. Chase Caller in 6 months and As needed

## 2015-04-13 ENCOUNTER — Encounter: Payer: Self-pay | Admitting: Internal Medicine

## 2015-04-13 ENCOUNTER — Ambulatory Visit (INDEPENDENT_AMBULATORY_CARE_PROVIDER_SITE_OTHER): Payer: Medicare HMO | Admitting: Internal Medicine

## 2015-04-13 VITALS — BP 110/64 | HR 70 | Ht 71.0 in | Wt 164.8 lb

## 2015-04-13 DIAGNOSIS — I5022 Chronic systolic (congestive) heart failure: Secondary | ICD-10-CM

## 2015-04-13 DIAGNOSIS — I480 Paroxysmal atrial fibrillation: Secondary | ICD-10-CM | POA: Diagnosis not present

## 2015-04-13 DIAGNOSIS — I255 Ischemic cardiomyopathy: Secondary | ICD-10-CM | POA: Diagnosis not present

## 2015-04-13 DIAGNOSIS — Z79899 Other long term (current) drug therapy: Secondary | ICD-10-CM

## 2015-04-13 DIAGNOSIS — I200 Unstable angina: Secondary | ICD-10-CM

## 2015-04-13 DIAGNOSIS — Z95810 Presence of automatic (implantable) cardiac defibrillator: Secondary | ICD-10-CM

## 2015-04-13 LAB — CUP PACEART INCLINIC DEVICE CHECK
Battery Remaining Longevity: 38.4
Brady Statistic RV Percent Paced: 98 %
Date Time Interrogation Session: 20170125150930
Implantable Lead Implant Date: 20110516
Implantable Lead Implant Date: 20150709
Implantable Lead Implant Date: 20150709
Implantable Lead Location: 753859
Implantable Lead Location: 753860
Lead Channel Pacing Threshold Amplitude: 2.5 V
Lead Channel Pacing Threshold Pulse Width: 0.5 ms
Lead Channel Pacing Threshold Pulse Width: 0.5 ms
Lead Channel Sensing Intrinsic Amplitude: 11.8 mV
Lead Channel Sensing Intrinsic Amplitude: 4 mV
Lead Channel Setting Pacing Amplitude: 2 V
Lead Channel Setting Pacing Amplitude: 2.5 V
Lead Channel Setting Pacing Pulse Width: 0.5 ms
MDC IDC LEAD LOCATION: 753858
MDC IDC LEAD MODEL: 7122
MDC IDC MSMT LEADCHNL LV PACING THRESHOLD AMPLITUDE: 2.25 V
MDC IDC MSMT LEADCHNL RA PACING THRESHOLD PULSEWIDTH: 0.4 ms
MDC IDC MSMT LEADCHNL RV PACING THRESHOLD AMPLITUDE: 0.875 V
MDC IDC SET LEADCHNL LV PACING AMPLITUDE: 3.25 V
MDC IDC SET LEADCHNL RV PACING PULSEWIDTH: 0.5 ms
MDC IDC SET LEADCHNL RV SENSING SENSITIVITY: 0.5 mV
MDC IDC STAT BRADY RA PERCENT PACED: 99.14 %
Pulse Gen Serial Number: 7201810

## 2015-04-13 LAB — HEPATIC FUNCTION PANEL
ALT: 10 U/L (ref 9–46)
AST: 23 U/L (ref 10–35)
Albumin: 4 g/dL (ref 3.6–5.1)
Alkaline Phosphatase: 65 U/L (ref 40–115)
BILIRUBIN DIRECT: 0.4 mg/dL — AB (ref <=0.2)
BILIRUBIN INDIRECT: 1.1 mg/dL (ref 0.2–1.2)
Total Bilirubin: 1.5 mg/dL — ABNORMAL HIGH (ref 0.2–1.2)
Total Protein: 7.4 g/dL (ref 6.1–8.1)

## 2015-04-13 NOTE — Assessment & Plan Note (Signed)
He is maintaining NSR on low dose amiodarone. He has had a slight uptick in his atrial fib. We will increase his amio if he has more atrial fib.

## 2015-04-13 NOTE — Progress Notes (Signed)
HPI [name] returns today for followup. He is a very pleasant 82 year old man with a near end-stage ischemic cardiomyopathy, left branch block, paroxysmal atrial fibrillation, who underwent biventricular ICD implantation approximately 24 months ago. He was on IV milrinone but has been weaned off this medication. While he still remains weak, his symptoms have improved. He has not been in the hospital with CHF. He denies anginal symptoms.  No peripheral edema. No ICD shock. His weight is actually down. No edema. Allergies  Allergen Reactions  . Codeine     "tripped out on it"  . Morphine     "tripped out on it"     Current Outpatient Prescriptions  Medication Sig Dispense Refill  . ALPRAZolam (XANAX) 0.25 MG tablet Take 0.375 mg by mouth at bedtime.    Marland Kitchen amiodarone (PACERONE) 200 MG tablet Take 100 mg by mouth daily.    Marland Kitchen apixaban (ELIQUIS) 5 MG TABS tablet Take 1 tablet (5 mg total) by mouth 2 (two) times daily. 60 tablet 6  . atorvastatin (LIPITOR) 20 MG tablet Take 20 mg by mouth daily at 6 PM.     . B Complex-C (SUPER B COMPLEX) TABS Take 1 tablet by mouth daily.     . calcium carbonate (OS-CAL) 600 MG TABS Take 600 mg by mouth daily with breakfast.     . carvedilol (COREG) 3.125 MG tablet Take 1 tablet (3.125 mg total) by mouth 2 (two) times daily with a meal. 60 tablet 3  . cetirizine (ZYRTEC) 10 MG tablet Take 10 mg by mouth daily.     . digoxin (LANOXIN) 0.125 MG tablet Take 0.5 tablets (0.0625 mg total) by mouth daily. 90 tablet 3  . fluticasone (FLONASE) 50 MCG/ACT nasal spray Place 1 spray into both nostrils daily. Reported on 04/11/2015    . furosemide (LASIX) 80 MG tablet Take 1 tablet (80 mg total) by mouth daily. 90 tablet 3  . levothyroxine (SYNTHROID, LEVOTHROID) 112 MCG tablet Take 112 mcg by mouth daily.    . magnesium gluconate (MAGONATE) 500 MG tablet Take 500 mg by mouth daily.     . Multiple Vitamin (MULTIVITAMIN WITH MINERALS) TABS Take 1 tablet by mouth  daily.    . nitroGLYCERIN (NITROSTAT) 0.4 MG SL tablet Place 1 tablet (0.4 mg total) under the tongue every 5 (five) minutes as needed for chest pain. 25 tablet 3  . Omega-3 Fatty Acids (FISH OIL) 1200 MG CAPS Take 1,200 mg by mouth 2 (two) times daily.    Marland Kitchen omeprazole (PRILOSEC) 20 MG capsule Take 20 mg by mouth daily as needed (indigestion).     . Saw Palmetto, Serenoa repens, (SAW PALMETTO PO) Take 1 capsule by mouth 2 (two) times daily.    Marland Kitchen tiotropium (SPIRIVA) 18 MCG inhalation capsule Place 1 capsule (18 mcg total) into inhaler and inhale daily. 30 capsule 6  . vitamin C (ASCORBIC ACID) 500 MG tablet Take 500 mg by mouth daily.      . [DISCONTINUED] diltiazem (CARDIZEM CD) 120 MG 24 hr capsule      No current facility-administered medications for this visit.     Past Medical History  Diagnosis Date  . Coronary artery disease     a. LHC (08/2012): Lmain: long, 20% stenosis distally LAD: mod calcified and mild dz prox. diff dz proximally 30%, first diagonal mild dz prox and small, Ramus intermediate brach lg bifurcates mid vessel prox. 90-95% stenosis LCx: relatively sml, rise to single marginal and contiine  AV groove, 1st marg. diff 60-70% dz stenosis prox RCA: occ. prox. R-L coll, successful stent remus interm branch BMS  . Other and unspecified hyperlipidemia   . Hypertension   . Sinoatrial node dysfunction (HCC)   . PVD (peripheral vascular disease) ([place])     s/p renal artery stent 20-04  . AAA (abdominal aortic aneurysm) ([place])     repaired  . Systolic congestive heart failure ([place])     a. ICM b.   . Ischemic cardiomyopathy   . Carotid artery occlusion   . Atrial fibrillation ([place])   . GERD (gastroesophageal reflux disease)   . Hypothyroidism   . Automatic implantable cardioverter-defibrillator in situ   . Myocardial infarction ([place])   . HCAP (healthcare-associated pneumonia) 2014  . Stroke [facility]) 2014    left sided affected no residual effects  . Arthritis     "left elbow  and shoulder" (09/24/2013)  . Anxiety   . Colon polyp   . Congestive heart failure ([place])   . H/O blood clots   . Hyperlipidemia   . Pneumonia     ROS:   All systems reviewed and negative except as noted in the HPI.   Past Surgical History  Procedure Laterality Date  . Cardiac pacemaker placement  08/01/2009    st jude dual chamber; explanted 09/24/2013  . Colonoscopy    . Esophagogastroduodenoscopy    . Transluminal angioplasty    . Tee without cardioversion N/A 09/23/2012    Procedure: TRANSESOPHAGEAL ECHOCARDIOGRAM (TEE);  Surgeon: Lelon Perla, MD;  Location: [facility] ENDOSCOPY;  Service: Cardiovascular;  Laterality: N/A;  . Cardioversion N/A 09/23/2012    Procedure: CARDIOVERSION;  Surgeon: Lelon Perla, MD;  Location: [facility] ENDOSCOPY;  Service: Cardiovascular;  Laterality: N/A;  . Renal artery stent    . Abdominal aortic aneurysm repair  05/17/1993  . Coronary angioplasty with stent placement  08/2012    "1"  . Endarterectomy Right 11/25/2012    Procedure: ENDARTERECTOMY CAROTID With Dacron Patch Angioplasty;  Surgeon: Elam Dutch, MD;  Location: [facility];  Service: Vascular;  Laterality: Right;  . Carotid endarterectomy Right 11-25-12  . Inguinal hernia repair Bilateral     "one at a time"  . Bi-ventricular implantable cardioverter defibrillator  (crt-d)  09/24/2013  . Cataract extraction w/ intraocular lens  implant, bilateral Bilateral   . Coronary artery bypass graft  05/17/1993    "CABG X4"  . Left and right heart catheterization with coronary angiogram N/A 09/15/2012    Procedure: LEFT AND RIGHT HEART CATHETERIZATION WITH CORONARY ANGIOGRAM;  Surgeon: Peter M Martinique, MD;  Location: [facility] CATH LAB;  Service: Cardiovascular;  Laterality: N/A;  . Percutaneous coronary stent intervention (pci-s)  09/15/2012    Procedure: PERCUTANEOUS CORONARY STENT INTERVENTION (PCI-S);  Surgeon: Peter M Martinique, MD;  Location: [facility] CATH LAB;  Service: Cardiovascular;;  . Right heart catheterization N/A  08/31/2013    Procedure: RIGHT HEART CATH;  Surgeon: Jolaine Artist, MD;  Location: [facility] CATH LAB;  Service: Cardiovascular;  Laterality: N/A;  . Bi-ventricular implantable cardioverter defibrillator N/A 09/24/2013    Procedure: BI-VENTRICULAR IMPLANTABLE CARDIOVERTER DEFIBRILLATOR  (CRT-D);  Surgeon: Evans Lance, MD;  Location: [facility] CATH LAB;  Service: Cardiovascular;  Laterality: N/A;  . Defribrillator       Family History  Problem Relation Age of Onset  . CAD Father   . Hypertension Father   . Heart disease Father   . CAD Brother   . Heart disease Brother   .  Heart attack Brother   . Diabetes Brother   . Lung cancer Sister   . Heart disease Sister   . Hypertension Sister   . Hypertension Mother   . Hypertension Daughter   . Vascular Disease Daughter     Right Carotid  . Colon cancer Son   . Cancer Son     Colon  . Colon polyps Brother     x2     Social History   Social History  . Marital Status: Married    Spouse Name: N/A  . Number of Children: 4  . Years of Education: N/A   Occupational History  . Retired    Social History Main Topics  . Smoking status: Former Smoker -- 1.00 packs/day for 50 years    Types: Cigarettes    Quit date: 05/17/1993  . Smokeless tobacco: Current User    Types: Chew  . Alcohol Use: No  . Drug Use: No  . Sexual Activity: Not Currently   Other Topics Concern  . Not on file   Social History Narrative     BP 110/64 mmHg  Pulse 70  Ht '5\' 11"'$  (1.803 m)  Wt 164 lb 12.8 oz (74.753 kg)  BMI 23.00 kg/m2  Physical Exam:  Well appearing 82 year old man, NAD HEENT: Unremarkable Neck:  6 cm JVD, no thyromegally Back:  No CVA tenderness Lungs:  Clear with no wheezes, rales, or rhonchi. HEART:  Regular rate rhythm, no murmurs, no rubs, no clicks Abd:  soft, positive bowel sounds, no organomegally, no rebound, no guarding Ext:  2 plus pulses, no edema, no cyanosis, no clubbing Skin:  No rashes no nodules Neuro:  CN II through  XII intact, motor grossly intact  ECG - NSR with AV sequential biv pacing  DEVICE  Normal device function.  See PaceArt for details.   Assess/Plan:

## 2015-04-13 NOTE — Patient Instructions (Signed)
Medication Instructions:  Your physician recommends that you continue on your current medications as directed. Please refer to the Current Medication list given to you today.  Labwork: Medication surveillance labs: TSH, HFP  Testing/Procedures: None ordered  Follow-Up: Remote monitoring is used to monitor your Pacemaker of ICD from home. This monitoring reduces the number of office visits required to check your device to one time per year. It allows [place] to keep an eye on the functioning of your device to ensure it is working properly. You are scheduled for a device check from home on 07/13/15. You may send your transmission at any time that day. If you have a wireless device, the transmission will be sent automatically. After your physician reviews your transmission, you will receive a postcard with your next transmission date.  Your physician wants you to follow-up in: 1 year with Dr. Lovena Le. You will receive a reminder letter in the mail two months in advance. If you don't receive a letter, please call our office to schedule the follow-up appointment.  If you need a refill on your cardiac medications before your next appointment, please call your pharmacy.  Thank you for choosing [facility]!!   Trinidad Curet, RN [phone]

## 2015-04-13 NOTE — Assessment & Plan Note (Signed)
He denies anginal symptoms. No change in meds.

## 2015-04-13 NOTE — Assessment & Plan Note (Signed)
His symptoms are class 3A. He will continue his current meds.

## 2015-04-13 NOTE — Assessment & Plan Note (Signed)
His BiV ICD is working normally. Will recheck in several months.

## 2015-04-14 LAB — TSH: TSH: 3.101 u[IU]/mL (ref 0.350–4.500)

## 2015-04-25 ENCOUNTER — Other Ambulatory Visit: Payer: Self-pay | Admitting: *Deleted

## 2015-04-25 DIAGNOSIS — I255 Ischemic cardiomyopathy: Secondary | ICD-10-CM

## 2015-04-25 MED ORDER — NITROGLYCERIN 0.4 MG SL SUBL: 0.4000 mg | SUBLINGUAL_TABLET | SUBLINGUAL | Status: DC | PRN

## 2015-05-12 ENCOUNTER — Other Ambulatory Visit (HOSPITAL_COMMUNITY): Payer: Self-pay | Admitting: Internal Medicine

## 2015-05-26 ENCOUNTER — Other Ambulatory Visit (HOSPITAL_COMMUNITY): Payer: Self-pay | Admitting: Internal Medicine

## 2015-06-07 ENCOUNTER — Telehealth (HOSPITAL_COMMUNITY): Payer: Self-pay

## 2015-06-07 ENCOUNTER — Encounter (HOSPITAL_COMMUNITY): Payer: Self-pay

## 2015-06-07 ENCOUNTER — Other Ambulatory Visit (HOSPITAL_COMMUNITY): Payer: Self-pay

## 2015-06-07 MED ORDER — APIXABAN 5 MG PO TABS: 5.0000 mg | ORAL_TABLET | Freq: Two times a day (BID) | ORAL | Status: DC

## 2015-06-07 NOTE — Telephone Encounter (Signed)
Wife called to ask for a month supply of eliquis samples. In asking why they are unable to get this from their pharmacy, she states "we have always gotten samples for a month supply". In speaking with CHF pharmacist Doroteo Bradford and other clinical staff in CHF clinic, we has tried to get patient enrolled in the [place] patient assistance foundation but he has failed to complete his part of the forms. Will give patient a 2 week supply of samples along with completed patient assistance enrollment form to complete and bring back to [place] before any future samples will be dispensed. Patient must exhaust other resources (patient assistance foundation, heart failure fund through [employer]) before we continue to dispense all of his eliquis full time through samples.  Renee Pain

## 2015-06-17 ENCOUNTER — Encounter: Payer: Self-pay | Admitting: Family

## 2015-06-23 ENCOUNTER — Ambulatory Visit (HOSPITAL_COMMUNITY)
Admission: RE | Admit: 2015-06-23 | Discharge: 2015-06-23 | Disposition: A | Discharge status: Home / Self Care | Payer: Medicare HMO | Source: Ambulatory Visit | Attending: Family | Admitting: Family

## 2015-06-23 ENCOUNTER — Ambulatory Visit (INDEPENDENT_AMBULATORY_CARE_PROVIDER_SITE_OTHER): Payer: Medicare HMO | Admitting: Family

## 2015-06-23 ENCOUNTER — Encounter: Payer: Self-pay | Admitting: Family

## 2015-06-23 VITALS — BP 105/63 | HR 78 | Temp 97.6°F | Resp 14 | Ht 71.0 in | Wt 161.9 lb

## 2015-06-23 DIAGNOSIS — Z48812 Encounter for surgical aftercare following surgery on the circulatory system: Secondary | ICD-10-CM

## 2015-06-23 DIAGNOSIS — Z9889 Other specified postprocedural states: Secondary | ICD-10-CM | POA: Diagnosis not present

## 2015-06-23 DIAGNOSIS — I714 Abdominal aortic aneurysm, without rupture, unspecified: Secondary | ICD-10-CM

## 2015-06-23 DIAGNOSIS — I708 Atherosclerosis of other arteries: Secondary | ICD-10-CM

## 2015-06-23 DIAGNOSIS — I6521 Occlusion and stenosis of right carotid artery: Secondary | ICD-10-CM | POA: Diagnosis not present

## 2015-06-23 DIAGNOSIS — I2510 Atherosclerotic heart disease of native coronary artery without angina pectoris: Secondary | ICD-10-CM | POA: Diagnosis not present

## 2015-06-23 DIAGNOSIS — I771 Stricture of artery: Secondary | ICD-10-CM

## 2015-06-23 DIAGNOSIS — R0989 Other specified symptoms and signs involving the circulatory and respiratory systems: Secondary | ICD-10-CM

## 2015-06-23 DIAGNOSIS — I6522 Occlusion and stenosis of left carotid artery: Secondary | ICD-10-CM | POA: Diagnosis not present

## 2015-06-23 NOTE — Patient Instructions (Signed)
Stroke Prevention Some medical conditions and behaviors are associated with an increased chance of having a stroke. You may prevent a stroke by making healthy choices and managing medical conditions. HOW CAN I REDUCE MY RISK OF HAVING A STROKE?   Stay physically active. Get at least 30 minutes of activity on most or all days.  Do not smoke. It may also be helpful to avoid exposure to secondhand smoke.  Limit alcohol use. Moderate alcohol use is considered to be:  No more than 2 drinks per day for men.  No more than 1 drink per day for nonpregnant women.  Eat healthy foods. This involves:  Eating 5 or more servings of fruits and vegetables a day.  Making dietary changes that address high blood pressure (hypertension), high cholesterol, diabetes, or obesity.  Manage your cholesterol levels.  Making food choices that are high in fiber and low in saturated fat, trans fat, and cholesterol may control cholesterol levels.  Take any prescribed medicines to control cholesterol as directed by your health care provider.  Manage your diabetes.  Controlling your carbohydrate and sugar intake is recommended to manage diabetes.  Take any prescribed medicines to control diabetes as directed by your health care provider.  Control your hypertension.  Making food choices that are low in salt (sodium), saturated fat, trans fat, and cholesterol is recommended to manage hypertension.  Ask your health care provider if you need treatment to lower your blood pressure. Take any prescribed medicines to control hypertension as directed by your health care provider.  If you are 18-39 years of age, have your blood pressure checked every 3-5 years. If you are 40 years of age or older, have your blood pressure checked every year.  Maintain a healthy weight.  Reducing calorie intake and making food choices that are low in sodium, saturated fat, trans fat, and cholesterol are recommended to manage  weight.  Stop drug abuse.  Avoid taking birth control pills.  Talk to your health care provider about the risks of taking birth control pills if you are over 35 years old, smoke, get migraines, or have ever had a blood clot.  Get evaluated for sleep disorders (sleep apnea).  Talk to your health care provider about getting a sleep evaluation if you snore a lot or have excessive sleepiness.  Take medicines only as directed by your health care provider.  For some people, aspirin or blood thinners (anticoagulants) are helpful in reducing the risk of forming abnormal blood clots that can lead to stroke. If you have the irregular heart rhythm of atrial fibrillation, you should be on a blood thinner unless there is a good reason you cannot take them.  Understand all your medicine instructions.  Make sure that other conditions (such as anemia or atherosclerosis) are addressed. SEEK IMMEDIATE MEDICAL CARE IF:   You have sudden weakness or numbness of the face, arm, or leg, especially on one side of the body.  Your face or eyelid droops to one side.  You have sudden confusion.  You have trouble speaking (aphasia) or understanding.  You have sudden trouble seeing in one or both eyes.  You have sudden trouble walking.  You have dizziness.  You have a loss of balance or coordination.  You have a sudden, severe headache with no known cause.  You have new chest pain or an irregular heartbeat. Any of these symptoms may represent a serious problem that is an emergency. Do not wait to see if the symptoms will   go away. Get medical help at once. Call your local emergency services (911 in [place] Do not drive yourself to the hospital.   This information is not intended to replace advice given to you by your health care provider. Make sure you discuss any questions you have with your health care provider.   Document Released: 04/12/2004 Document Revised: 03/26/2014 Document Reviewed:  09/05/2012 Elsevier Interactive Patient Education 2016 Elsevier Inc.  

## 2015-06-23 NOTE — Progress Notes (Signed)
Chief Complaint: Extracranial Carotid Artery Stenosis   History of Present Illness  Trevor Simpson is a 82 y.o. male patient of Dr. Oneida Alar who returns for follow up s/p right carotid endarterectomy on 11/25/2012. This was a symptomatic carotid stenosis with a stroke as manifested by left hemiparesis and slurred speech before the CEA. He denies any symptoms of TIA or stroke since the CEA.  He also is s/p AAA open repair in 1995, also possible bpg of iliac arteries and renal arteries for stenosis.  He has known degenerative changes in his cervical spine. This is now resolved. His swallowing is normal. He has had no changes in his voice. He has no slurred speech. The patient denies New Medical or Surgical History. Pt's wife states his EF is 20-25%.  June 2015 2D echocardiogram results reviewed: LV EF of 15%.  He exercises an hour daily. He denies dizziness, denies tingling, numbness, pain, or weakness in either UE, but does report both hands feeling cold, right worse than left.  Pt Diabetic: No Pt smoker: former smoker, quit in 1995  Pt meds include: Statin : Yes ASA: No Other anticoagulants/antiplatelets: Eliquis for atrial fib.   Past Medical History  Diagnosis Date  . Coronary artery disease     a. LHC (08/2012): [place]: long, 20% stenosis distally LAD: mod calcified and mild dz prox. diff dz proximally 30%, first diagonal mild dz prox and small, Ramus intermediate brach lg bifurcates mid vessel prox. 90-95% stenosis LCx: relatively sml, rise to single marginal and contiine AV groove, 1st marg. diff 60-70% dz stenosis prox RCA: occ. prox. R-L coll, successful stent remus interm branch BMS  . Other and unspecified hyperlipidemia   . Hypertension   . Sinoatrial node dysfunction (HCC)   . PVD (peripheral vascular disease) ([place])     s/p renal artery stent 20-04  . AAA (abdominal aortic aneurysm) ([place])     repaired  . Systolic congestive heart failure ([place])     a. ICM b.   .  Ischemic cardiomyopathy   . Carotid artery occlusion   . Atrial fibrillation ([place])   . GERD (gastroesophageal reflux disease)   . Hypothyroidism   . Automatic implantable cardioverter-defibrillator in situ   . Myocardial infarction ([place])   . HCAP (healthcare-associated pneumonia) 2014  . Stroke [facility]) 2014    left sided affected no residual effects  . Arthritis     "left elbow and shoulder" (09/24/2013)  . Anxiety   . Colon polyp   . Congestive heart failure ([place])   . H/O blood clots   . Hyperlipidemia   . Pneumonia     Social History Social History  Substance Use Topics  . Smoking status: Former Smoker -- 1.00 packs/day for 50 years    Types: Cigarettes    Quit date: 05/17/1993  . Smokeless tobacco: Current User    Types: Chew  . Alcohol Use: No    Family History Family History  Problem Relation Age of Onset  . CAD Father   . Hypertension Father   . Heart disease Father   . CAD Brother   . Heart disease Brother   . Heart attack Brother   . Diabetes Brother   . Lung cancer Sister   . Heart disease Sister   . Hypertension Sister   . Hypertension Mother   . Hypertension Daughter   . Vascular Disease Daughter     Right Carotid  . Colon cancer Son   . Cancer Son  Colon  . Colon polyps Brother     x2    Surgical History Past Surgical History  Procedure Laterality Date  . Cardiac pacemaker placement  08/01/2009    st jude dual chamber; explanted 09/24/2013  . Colonoscopy    . Esophagogastroduodenoscopy    . Transluminal angioplasty    . Tee without cardioversion N/A 09/23/2012    Procedure: TRANSESOPHAGEAL ECHOCARDIOGRAM (TEE);  Surgeon: Lelon Perla, MD;  Location[facility] ENDOSCOPY;  Service: Cardiovascular;  Laterality: N/A;  . Cardioversion N/A 09/23/2012    Procedure: CARDIOVERSION;  Surgeon: Lelon Perla, MD;  Location: [facility] ENDOSCOPY;  Service: Cardiovascular;  Laterality: N/A;  . Renal artery stent    . Abdominal aortic aneurysm repair  05/17/1993  .  Coronary angioplasty with stent placement  08/2012    "1"  . Endarterectomy Right 11/25/2012    Procedure: ENDARTERECTOMY CAROTID With Dacron Patch Angioplasty;  Surgeon: Elam Dutch, MD;  Location: [facility];  Service: Vascular;  Laterality: Right;  . Carotid endarterectomy Right 11-25-12  . Inguinal hernia repair Bilateral     "one at a time"  . Bi-ventricular implantable cardioverter defibrillator  (crt-d)  09/24/2013  . Cataract extraction w/ intraocular lens  implant, bilateral Bilateral   . Coronary artery bypass graft  05/17/1993    "CABG X4"  . Left and right heart catheterization with coronary angiogram N/A 09/15/2012    Procedure: LEFT AND RIGHT HEART CATHETERIZATION WITH CORONARY ANGIOGRAM;  Surgeon: Peter M Martinique, MD;  Location: [facility] CATH LAB;  Service: Cardiovascular;  Laterality: N/A;  . Percutaneous coronary stent intervention (pci-s)  09/15/2012    Procedure: PERCUTANEOUS CORONARY STENT INTERVENTION (PCI-S);  Surgeon: Peter M Martinique, MD;  Location: [facility] CATH LAB;  Service: Cardiovascular;;  . Right heart catheterization N/A 08/31/2013    Procedure: RIGHT HEART CATH;  Surgeon: Jolaine Artist, MD;  Location: [facility] CATH LAB;  Service: Cardiovascular;  Laterality: N/A;  . Bi-ventricular implantable cardioverter defibrillator N/A 09/24/2013    Procedure: BI-VENTRICULAR IMPLANTABLE CARDIOVERTER DEFIBRILLATOR  (CRT-D);  Surgeon: Evans Lance, MD;  Location: [facility] CATH LAB;  Service: Cardiovascular;  Laterality: N/A;  . Defribrillator      Allergies  Allergen Reactions  . Codeine     "tripped out on it"  . Morphine     "tripped out on it"    Current Outpatient Prescriptions  Medication Sig Dispense Refill  . ALPRAZolam (XANAX) 0.25 MG tablet Take 0.375 mg by mouth at bedtime.    Marland Kitchen amiodarone (PACERONE) 200 MG tablet Take 100 mg by mouth daily.    Marland Kitchen amiodarone (PACERONE) 200 MG tablet Take 0.5 tablets (100 mg total) by mouth daily. 45 tablet 3  . apixaban (ELIQUIS) 5 MG TABS tablet Take 1  tablet (5 mg total) by mouth 2 (two) times daily. 60 tablet 6  . atorvastatin (LIPITOR) 20 MG tablet Take 20 mg by mouth daily at 6 PM.     . B Complex-C (SUPER B COMPLEX) TABS Take 1 tablet by mouth daily.     . calcium carbonate (OS-CAL) 600 MG TABS Take 600 mg by mouth daily with breakfast.     . carvedilol (COREG) 3.125 MG tablet TAKE 1 TABLET BY MOUTH 2 TIMES A DAY WITH A MEAL 60 tablet 3  . cetirizine (ZYRTEC) 10 MG tablet Take 10 mg by mouth daily.     . digoxin (LANOXIN) 0.125 MG tablet Take 0.5 tablets (0.0625 mg total) by mouth daily. 90 tablet 3  . fluticasone (FLONASE)  50 MCG/ACT nasal spray Place 1 spray into both nostrils daily. Reported on 04/11/2015    . furosemide (LASIX) 80 MG tablet Take 1 tablet (80 mg total) by mouth daily. 90 tablet 3  . levothyroxine (SYNTHROID, LEVOTHROID) 112 MCG tablet Take 112 mcg by mouth daily.    . magnesium gluconate (MAGONATE) 500 MG tablet Take 500 mg by mouth daily.     . Multiple Vitamin (MULTIVITAMIN WITH MINERALS) TABS Take 1 tablet by mouth daily.    . nitroGLYCERIN (NITROSTAT) 0.4 MG SL tablet Place 1 tablet (0.4 mg total) under the tongue every 5 (five) minutes as needed for chest pain. 25 tablet 1  . Omega-3 Fatty Acids (FISH OIL) 1200 MG CAPS Take 1,200 mg by mouth 2 (two) times daily.    Marland Kitchen omeprazole (PRILOSEC) 20 MG capsule Take 20 mg by mouth daily as needed (indigestion).     . Saw Palmetto, Serenoa repens, (SAW PALMETTO PO) Take 1 capsule by mouth 2 (two) times daily.    Marland Kitchen tiotropium (SPIRIVA) 18 MCG inhalation capsule Place 1 capsule (18 mcg total) into inhaler and inhale daily. 30 capsule 6  . vitamin C (ASCORBIC ACID) 500 MG tablet Take 500 mg by mouth daily.      . [DISCONTINUED] diltiazem (CARDIZEM CD) 120 MG 24 hr capsule      No current facility-administered medications for this visit.    Review of Systems : See HPI for pertinent positives and negatives.  Physical Examination  Filed Vitals:   06/23/15 1150  BP: 105/63   Pulse: 78  Temp: 97.6 F (36.4 C)  TempSrc: Oral  Resp: 14  Height: '5\' 11"'$  (1.803 m)  Weight: 161 lb 14.4 oz (73.437 kg)  SpO2: 100%   Body mass index is 22.59 kg/(m^2).  General: WDWN male in NAD GAIT: normal Eyes: PERRLA Pulmonary: Non-labored, CTAB Cardiac: regular rhythm,no detected murmur. Pacemaker/defibrillator subcutaneous left upper chest.  VASCULAR EXAM Carotid Bruits Right Left   negative negative   Aorta is not palpable. Radial pulses are 2+ palpable and =.      LE Pulses Right Left       FEMORAL 2+ palpable 1+ palpable   POPLITEAL not palpable  nort palpable   POSTERIOR TIBIAL not palpable  not palpable    DORSALIS PEDIS  ANTERIOR TIBIAL not palpable  not palpable     Gastrointestinal: soft, nontender, BS WNL, no r/g,no palpated masses.  Musculoskeletal: Negative muscle atrophy/wasting. M/S 5/5 throughout, Extremities without ischemic changes except for small shallow healing ulcer at the tip of right great toe. Feet are moderately cyanotic when dependant, are pink when elevated. Moderate kyphosis.  Neurologic: A&O X 3; Appropriate Affect;  Speech is slightly garbled, but he is also missing many teeth. CN 2-12 intact, Pain and light touch intact in extremities, Motor exam as listed above.          Non-Invasive Vascular Imaging CAROTID DUPLEX 06/23/2015   CEREBROVASCULAR DUPLEX EVALUATION    INDICATION: Carotid artery disease     PREVIOUS INTERVENTION(S): Right carotid endarterectomy 11/25/2012    DUPLEX EXAM:     RIGHT  LEFT  Peak Systolic Velocities (cm/s) End Diastolic Velocities (cm/s) Plaque LOCATION Peak Systolic Velocities (cm/s) End Diastolic Velocities (cm/s) Plaque  61 7  CCA PROXIMAL 62 8   55 8  CCA MID 78 10   68 9  CCA DISTAL 77 10 HT  148 0 HM ECA 100 0   53  11  ICA PROXIMAL 80 18 HT  9 11  ICA MID 80 16   48 11  ICA DISTAL 69 18     NA ICA / CCA Ratio (PSV) 1.02  Antegrade  Vertebral Flow Not Visualized    Brachial Systolic Pressure (mmHg)   Multiphasic (Subclavian artery) Brachial Artery Waveforms Turbulent (Subclavian artery)    Plaque Morphology:  HM = Homogeneous, HT = Heterogeneous, CP = Calcific Plaque, SP = Smooth Plaque, IP = Irregular Plaque     ADDITIONAL FINDINGS: Let subclavian artery waveform is turbulent and suggestive of proximal stenosis.    IMPRESSION: Patent right carotid endarterectomy site with no evidence of hyperplasia or restenosis.  Left internal carotid artery velocities suggest a <40% stenosis.     Compared to the previous exam:  No significant change in comparison to the last exam on 06/17/14.      Assessment: Trevor Simpson is a 82 y.o. male who is s/p right carotid endarterectomy on 11/25/2012. This was a symptomatic carotid stenosis with a stroke as manifested by left hemiparesis and slurred speech before the CEA. He denies any symptoms of TIA or stroke since the CEA.  He also is s/p AAA open repair in 1995, also possible bpg of iliac arteries and renal arteries for stenosis. He exercises an hour daily despite an EF of 15%. He does not seem to have claudication symptoms, no signs of ischemia in his feet/legs other than a healing light colored dry ulcer at the tip of his right great toe.   Today's carotid duplex suggests a patent right carotid endarterectomy site with no evidence of hyperplasia or restenosis.  Left internal carotid artery velocities suggest a <40% stenosis. Left subclavian artery waveform is turbulent and suggestive of proximal stenosis.  No significant change in comparison to the last exam on 06/17/14.    Plan: Follow-up in 1year with Carotid Duplex scan and ABI's.   I discussed in depth with the patient the nature of atherosclerosis, and emphasized the importance of maximal medical  management including strict control of blood pressure, blood glucose, and lipid levels, obtaining regular exercise, and continued cessation of smoking.  The patient is aware that without maximal medical management the underlying atherosclerotic disease process will progress, limiting the benefit of any interventions. The patient was given information about stroke prevention and what symptoms should prompt the patient to seek immediate medical care. Thank you for allowing [place] to participate in this patient's care.  Clemon Chambers, RN, MSN, FNP-C [facility] Office: [phone]  Clinic Physician: Oneida Alar  06/23/2015 12:18 PM

## 2015-07-13 ENCOUNTER — Ambulatory Visit (INDEPENDENT_AMBULATORY_CARE_PROVIDER_SITE_OTHER): Payer: Medicare HMO | Admitting: *Deleted

## 2015-07-13 ENCOUNTER — Telehealth: Payer: Self-pay | Admitting: Cardiology

## 2015-07-13 DIAGNOSIS — I255 Ischemic cardiomyopathy: Secondary | ICD-10-CM

## 2015-07-13 DIAGNOSIS — I5022 Chronic systolic (congestive) heart failure: Secondary | ICD-10-CM

## 2015-07-13 NOTE — Telephone Encounter (Signed)
Confirmed remote transmission w/ pt wife.   

## 2015-07-14 NOTE — Progress Notes (Signed)
Remote ICD transmission.   

## 2015-07-18 ENCOUNTER — Other Ambulatory Visit: Payer: Self-pay | Admitting: *Deleted

## 2015-07-18 DIAGNOSIS — I6523 Occlusion and stenosis of bilateral carotid arteries: Secondary | ICD-10-CM

## 2015-07-18 DIAGNOSIS — I739 Peripheral vascular disease, unspecified: Secondary | ICD-10-CM

## 2015-08-01 ENCOUNTER — Telehealth: Payer: Self-pay | Admitting: Cardiology

## 2015-08-01 NOTE — Telephone Encounter (Signed)
Transmission received and reviewed. Patient reports that he heard his device beep yesterday. No alerts, no patient initiated alerts. Patient made aware that his ICD would vibrate if he was to be alerted of device malfunction/abnormal test reading/ battery ready to be replaced. He says that his monitor beeped while sending transmission today- I informed him that was a normal process. He verbalizes understanding.

## 2015-08-01 NOTE — Telephone Encounter (Signed)
Spoke w/ pt and he stated that his wife heard his implanted device go off. Instructed pt to send a manual transmission w/ his home monitor. Pt verbalized understanding .

## 2015-08-18 DIAGNOSIS — E119 Type 2 diabetes mellitus without complications: Secondary | ICD-10-CM | POA: Diagnosis not present

## 2015-08-18 DIAGNOSIS — I10 Essential (primary) hypertension: Secondary | ICD-10-CM | POA: Diagnosis not present

## 2015-08-18 DIAGNOSIS — Z125 Encounter for screening for malignant neoplasm of prostate: Secondary | ICD-10-CM | POA: Diagnosis not present

## 2015-08-18 DIAGNOSIS — E039 Hypothyroidism, unspecified: Secondary | ICD-10-CM | POA: Diagnosis not present

## 2015-08-23 DIAGNOSIS — E7800 Pure hypercholesterolemia, unspecified: Secondary | ICD-10-CM | POA: Diagnosis not present

## 2015-08-23 DIAGNOSIS — I10 Essential (primary) hypertension: Secondary | ICD-10-CM | POA: Diagnosis not present

## 2015-08-23 DIAGNOSIS — I4891 Unspecified atrial fibrillation: Secondary | ICD-10-CM | POA: Diagnosis not present

## 2015-08-23 DIAGNOSIS — E119 Type 2 diabetes mellitus without complications: Secondary | ICD-10-CM | POA: Diagnosis not present

## 2015-08-24 ENCOUNTER — Encounter: Payer: Self-pay | Admitting: Cardiology

## 2015-08-24 LAB — CUP PACEART REMOTE DEVICE CHECK
Battery Remaining Longevity: 38 mo
Brady Statistic AP VS Percent: 1 %
Brady Statistic AS VS Percent: 1 %
HIGH POWER IMPEDANCE MEASURED VALUE: 70 Ohm
HighPow Impedance: 70 Ohm
Implantable Lead Implant Date: 20110516
Implantable Lead Implant Date: 20150709
Implantable Lead Implant Date: 20150709
Implantable Lead Location: 753859
Implantable Lead Location: 753860
Lead Channel Impedance Value: 760 Ohm
Lead Channel Pacing Threshold Amplitude: 1.75 V
Lead Channel Pacing Threshold Pulse Width: 1.2 ms
Lead Channel Sensing Intrinsic Amplitude: 4.1 mV
Lead Channel Setting Pacing Amplitude: 2.5 V
Lead Channel Setting Pacing Amplitude: 2.75 V
Lead Channel Setting Pacing Pulse Width: 0.5 ms
Lead Channel Setting Sensing Sensitivity: 0.5 mV
MDC IDC LEAD LOCATION: 753858
MDC IDC LEAD MODEL: 7122
MDC IDC MSMT BATTERY REMAINING PERCENTAGE: 63 %
MDC IDC MSMT BATTERY VOLTAGE: 2.93 V
MDC IDC MSMT LEADCHNL LV PACING THRESHOLD PULSEWIDTH: 0.5 ms
MDC IDC MSMT LEADCHNL RA IMPEDANCE VALUE: 380 Ohm
MDC IDC MSMT LEADCHNL RA PACING THRESHOLD AMPLITUDE: 1.625 V
MDC IDC MSMT LEADCHNL RV IMPEDANCE VALUE: 440 Ohm
MDC IDC MSMT LEADCHNL RV PACING THRESHOLD AMPLITUDE: 0.75 V
MDC IDC MSMT LEADCHNL RV PACING THRESHOLD PULSEWIDTH: 0.5 ms
MDC IDC MSMT LEADCHNL RV SENSING INTR AMPL: 11.8 mV
MDC IDC PG SERIAL: 7201810
MDC IDC SESS DTM: 20170427022417
MDC IDC SET LEADCHNL LV PACING PULSEWIDTH: 0.5 ms
MDC IDC SET LEADCHNL RV PACING AMPLITUDE: 2 V
MDC IDC STAT BRADY AP VP PERCENT: 96 %
MDC IDC STAT BRADY AS VP PERCENT: 2.3 %
MDC IDC STAT BRADY RA PERCENT PACED: 92 %

## 2015-09-01 ENCOUNTER — Other Ambulatory Visit (HOSPITAL_COMMUNITY): Payer: Self-pay | Admitting: Internal Medicine

## 2015-09-14 ENCOUNTER — Other Ambulatory Visit: Payer: Self-pay | Admitting: Internal Medicine

## 2015-09-14 DIAGNOSIS — I255 Ischemic cardiomyopathy: Secondary | ICD-10-CM

## 2015-09-14 MED ORDER — NITROGLYCERIN 0.4 MG SL SUBL: 0.4000 mg | SUBLINGUAL_TABLET | SUBLINGUAL | Status: AC | PRN

## 2015-10-06 ENCOUNTER — Ambulatory Visit: Payer: Medicare HMO | Admitting: Internal Medicine

## 2015-10-11 ENCOUNTER — Ambulatory Visit (INDEPENDENT_AMBULATORY_CARE_PROVIDER_SITE_OTHER): Payer: Medicare HMO | Admitting: Internal Medicine

## 2015-10-11 ENCOUNTER — Encounter: Payer: Self-pay | Admitting: Internal Medicine

## 2015-10-11 ENCOUNTER — Telehealth: Payer: Self-pay | Admitting: Internal Medicine

## 2015-10-11 VITALS — BP 86/48 | HR 72 | Ht 71.5 in | Wt 150.8 lb

## 2015-10-11 DIAGNOSIS — I959 Hypotension, unspecified: Secondary | ICD-10-CM | POA: Diagnosis not present

## 2015-10-11 DIAGNOSIS — J439 Emphysema, unspecified: Secondary | ICD-10-CM

## 2015-10-11 MED ORDER — UMECLIDINIUM BROMIDE 62.5 MCG/INH IN AEPB: 1.0000 | INHALATION_SPRAY | Freq: Every day | RESPIRATORY_TRACT | 0 refills | Status: AC

## 2015-10-11 NOTE — Telephone Encounter (Signed)
Dear Trevor Simpson was seen in my office for emphysema. We notice that his systolic blood pressure was only 86. He reports chronic hypotension with some orthostatic weakness but no history of falls so far. He says this a chronic problem but it is been more so in the last few months. He has an upcoming visit with the 10/24/2015. Wanted to bring this to you attention in caseyou needed to intervene before the visit  Dr. Brand Simpson, M.D., [facility].C.P Pulmonary and Critical Care Medicine Staff Physician [facility] Pager: [phone], If no answer or between  15:00h - 7:00h: call [phone]  10/11/2015 11:56 AM

## 2015-10-11 NOTE — Progress Notes (Signed)
Subjective:     Patient ID: Trevor Simpson, male   DOB: 1933-07-23, 82 y.o.   MRN: [id number]  HPI   OV 10/11/2015  Chief Complaint  Patient presents with  . Follow-up    6 month Emphysema follow up - reports breathing is doing well since last ov, no new complaints   82 year old male with emphysema and associated chronic systolic heart failure with ejection fraction 20% as of 2016. Associated lean body mass and cachexia. He presents for a six-month follow-up of emphysema. He is not on any bronchodilators. He simply cannot afford them. He refuses to have them. He is on multiple cardiac medications. Therefore he wants to sacrifice taking his pulmonary medication. He is unable to tell if the Spiriva helped him in the past. He feels he is currently stable and no acute problems. His main issue appears to be exertional fatigue and some mild exertional shortness of breath. He also has some orthostatic fatigue. Has chronic hypotension according to him and it is more noticeable in the last few months. He believes this is due to his cardiac issues. He has not had any orthostatic dizziness or falls. An appointment with Dr. Haroldine Laws in cardiology on 10/24/2015 according to his history.     has a past medical history of AAA (abdominal aortic aneurysm) ([place]); Anxiety; Arthritis; Atrial fibrillation ([place]); Automatic implantable cardioverter-defibrillator in situ; Carotid artery occlusion; Colon polyp; Congestive heart failure ([place]); Coronary artery disease; GERD (gastroesophageal reflux disease); H/O blood clots; HCAP (healthcare-associated pneumonia) (2014); Hyperlipidemia; Hypertension; Hypothyroidism; Ischemic cardiomyopathy; Myocardial infarction ([place]); Other and unspecified hyperlipidemia; Pneumonia; PVD (peripheral vascular disease) ([place]); Sinoatrial node dysfunction ([place]); Stroke [facility]) ([id number]); and Systolic congestive heart failure ([place]).   reports that he quit smoking about 22 years ago. His smoking use  included Cigarettes. He has a 50.00 pack-year smoking history. His smokeless tobacco use includes Chew.  Past Surgical History:  Procedure Laterality Date  . ABDOMINAL AORTIC ANEURYSM REPAIR  05/17/1993  . BI-VENTRICULAR IMPLANTABLE CARDIOVERTER DEFIBRILLATOR N/A 09/24/2013   Procedure: BI-VENTRICULAR IMPLANTABLE CARDIOVERTER DEFIBRILLATOR  (CRT-D);  Surgeon: Evans Lance, MD;  Location: [facility] CATH LAB;  Service: Cardiovascular;  Laterality: N/A;  . BI-VENTRICULAR IMPLANTABLE CARDIOVERTER DEFIBRILLATOR  (CRT-D)  09/24/2013  . CARDIAC PACEMAKER PLACEMENT  08/01/2009   st jude dual chamber; explanted 09/24/2013  . CARDIOVERSION N/A 09/23/2012   Procedure: CARDIOVERSION;  Surgeon: Lelon Perla, MD;  Location: [facility] ENDOSCOPY;  Service: Cardiovascular;  Laterality: N/A;  . CAROTID ENDARTERECTOMY Right 11-25-12  . CATARACT EXTRACTION W/ INTRAOCULAR LENS  IMPLANT, BILATERAL Bilateral   . COLONOSCOPY    . CORONARY ANGIOPLASTY WITH STENT PLACEMENT  08/2012   "1"  . CORONARY ARTERY BYPASS GRAFT  05/17/1993   "CABG X4"  . Defribrillator    . ENDARTERECTOMY Right 11/25/2012   Procedure: ENDARTERECTOMY CAROTID With Dacron Patch Angioplasty;  Surgeon: Elam Dutch, MD;  Location: [place];  Service: Vascular;  Laterality: Right;  . ESOPHAGOGASTRODUODENOSCOPY    . INGUINAL HERNIA REPAIR Bilateral    "one at a time"  . LEFT AND RIGHT HEART CATHETERIZATION WITH CORONARY ANGIOGRAM N/A 09/15/2012   Procedure: LEFT AND RIGHT HEART CATHETERIZATION WITH CORONARY ANGIOGRAM;  Surgeon: Peter M Martinique, MD;  Location: [facility] CATH LAB;  Service: Cardiovascular;  Laterality: N/A;  . PERCUTANEOUS CORONARY STENT INTERVENTION (PCI-S)  09/15/2012   Procedure: PERCUTANEOUS CORONARY STENT INTERVENTION (PCI-S);  Surgeon: Peter M Martinique, MD;  Location: [facility] CATH LAB;  Service: Cardiovascular;;  . RENAL ARTERY STENT    .  RIGHT HEART CATHETERIZATION N/A 08/31/2013   Procedure: RIGHT HEART CATH;  Surgeon: Jolaine Artist, MD;  Location: [facility] CATH  LAB;  Service: Cardiovascular;  Laterality: N/A;  . TEE WITHOUT CARDIOVERSION N/A 09/23/2012   Procedure: TRANSESOPHAGEAL ECHOCARDIOGRAM (TEE);  Surgeon: Lelon Perla, MD;  Location: [facility] ENDOSCOPY;  Service: Cardiovascular;  Laterality: N/A;  . TRANSLUMINAL ANGIOPLASTY      Allergies  Allergen Reactions  . Codeine     "tripped out on it"  . Morphine     "tripped out on it"    Immunization History  Administered Date(s) Administered  . Influenza,inj,Quad PF,36+ Mos 11/26/2012, 01/06/2014, 01/10/2015  . Pneumococcal Conjugate-13 04/11/2015  . Pneumococcal Polysaccharide-23 01/18/2012    Family History  Problem Relation Age of Onset  . CAD Father   . Hypertension Father   . Heart disease Father   . CAD Brother   . Heart disease Brother   . Heart attack Brother   . Diabetes Brother   . Lung cancer Sister   . Heart disease Sister   . Hypertension Sister   . Hypertension Mother   . Hypertension Daughter   . Vascular Disease Daughter     Right Carotid  . Colon cancer Son   . Cancer Son     Colon  . Colon polyps Brother     x2     Current Outpatient Prescriptions:  .  ALPRAZolam (XANAX) 0.25 MG tablet, Take 0.375 mg by mouth at bedtime., Disp: , Rfl:  .  amiodarone (PACERONE) 200 MG tablet, Take 0.5 tablets (100 mg total) by mouth daily., Disp: 45 tablet, Rfl: 3 .  apixaban (ELIQUIS) 5 MG TABS tablet, Take 1 tablet (5 mg total) by mouth 2 (two) times daily., Disp: 60 tablet, Rfl: 6 .  atorvastatin (LIPITOR) 20 MG tablet, Take 20 mg by mouth daily at 6 PM. , Disp: , Rfl:  .  B Complex-C (SUPER B COMPLEX) TABS, Take 1 tablet by mouth daily. , Disp: , Rfl:  .  calcium carbonate (OS-CAL) 600 MG TABS, Take 600 mg by mouth daily with breakfast. , Disp: , Rfl:  .  carvedilol (COREG) 3.125 MG tablet, TAKE 1 TABLET BY MOUTH 2 TIMES A DAY WITH A MEAL, Disp: 60 tablet, Rfl: 3 .  cetirizine (ZYRTEC) 10 MG tablet, Take 10 mg by mouth daily. , Disp: , Rfl:  .  digoxin (LANOXIN) 0.125  MG tablet, Take 0.5 tablets (0.0625 mg total) by mouth daily., Disp: 90 tablet, Rfl: 3 .  fluticasone (FLONASE) 50 MCG/ACT nasal spray, Place 1 spray into both nostrils daily. Reported on 04/11/2015, Disp: , Rfl:  .  furosemide (LASIX) 80 MG tablet, Take 1 tablet (80 mg total) by mouth daily., Disp: 90 tablet, Rfl: 3 .  levothyroxine (SYNTHROID, LEVOTHROID) 112 MCG tablet, Take 112 mcg by mouth daily., Disp: , Rfl:  .  magnesium gluconate (MAGONATE) 500 MG tablet, Take 500 mg by mouth daily. , Disp: , Rfl:  .  Multiple Vitamin (MULTIVITAMIN WITH MINERALS) TABS, Take 1 tablet by mouth daily., Disp: , Rfl:  .  nitroGLYCERIN (NITROSTAT) 0.4 MG SL tablet, Place 1 tablet (0.4 mg total) under the tongue every 5 (five) minutes as needed for chest pain., Disp: 25 tablet, Rfl: 3 .  Omega-3 Fatty Acids (FISH OIL) 1200 MG CAPS, Take 1,200 mg by mouth daily. , Disp: , Rfl:  .  omeprazole (PRILOSEC) 20 MG capsule, Take 20 mg by mouth daily as needed (indigestion). , Disp: , Rfl:  .  Saw Palmetto, Serenoa repens, (SAW PALMETTO PO), Take 1 capsule by mouth 2 (two) times daily., Disp: , Rfl:  .  vitamin C (ASCORBIC ACID) 500 MG tablet, Take 500 mg by mouth daily.  , Disp: , Rfl:  .  tiotropium (SPIRIVA) 18 MCG inhalation capsule, Place 1 capsule (18 mcg total) into inhaler and inhale daily. (Patient not taking: Reported on 10/11/2015), Disp: 30 capsule, Rfl: 6   Review of Systems     Objective:   Physical Exam  Constitutional: He is oriented to person, place, and time. No distress.  Frail elderly fall male  HENT:  Head: Normocephalic and atraumatic.  Right Ear: External ear normal.  Left Ear: External ear normal.  Mouth/Throat: Oropharynx is clear and moist. No oropharyngeal exudate.  Voice and tone is weak  Eyes: Conjunctivae and EOM are normal. Pupils are equal, round, and reactive to light. Right eye exhibits no discharge. Left eye exhibits no discharge. No scleral icterus.  Neck: Normal range of  motion. Neck supple. No JVD present. No tracheal deviation present. No thyromegaly present.  Cardiovascular: Normal rate, regular rhythm and intact distal pulses.  Exam reveals no gallop and no friction rub.   No murmur heard. Pulmonary/Chest: Effort normal and breath sounds normal. No respiratory distress. He has no wheezes. He has no rales. He exhibits no tenderness.  Abdominal: Soft. Bowel sounds are normal. He exhibits no distension and no mass. There is no tenderness. There is no rebound and no guarding.  Musculoskeletal: Normal range of motion. He exhibits no edema or tenderness.  Lymphadenopathy:    He has no cervical adenopathy.  Neurological: He is alert and oriented to person, place, and time. He has normal reflexes. No cranial nerve deficit. Coordination normal.  Skin: Skin is warm and dry. No rash noted. He is not diaphoretic. No erythema. No pallor.  Psychiatric: He has a normal mood and affect. His behavior is normal. Judgment and thought content normal.  Nursing note and vitals reviewed.  Vitals:   10/11/15 1137  BP: (!) 86/48  Pulse: 72  SpO2: 100%  Weight: 150 lb 12.8 oz (68.4 kg)  Height: 5' 11.5" (1.816 m)   Estimated body mass index is 20.74 kg/m as calculated from the following:   Height as of this encounter: 5' 11.5" (1.816 m).   Weight as of this encounter: 150 lb 12.8 oz (68.4 kg).      Assessment:       ICD-9-CM ICD-10-CM   1. Pulmonary emphysema, unspecified emphysema type ([place]) 492.8 J43.9   2. Hypotension, unspecified hypotension type 458.9 I95.9        Plan:     Pulmonary emphysema, unspecified emphysema type (H[facility]) - Stable disease - Please make sure you get the flu shot in the fall - take a sample of incruse if we have it; see if it helps you and if so we can send script  Hypotension, unspecified hypotension type - We will let Dr. Haroldine Laws be aware of the problem   Follow-up - Flu shot in the fall - Return in 6 months or sooner if  needed   Dr. Brand Males, M.D., [facility].C.P Pulmonary and Critical Care Medicine Staff Physician [facility] Pager: [phone], If no answer or between  15:00h - 7:00h: call [phone]  10/11/2015 11:55 AM

## 2015-10-11 NOTE — Addendum Note (Signed)
Addended by: Parke Poisson E on: 10/11/2015 01:28 PM   Modules accepted: Orders

## 2015-10-11 NOTE — Telephone Encounter (Signed)
Dr. Chase Caller you sent this to triage.

## 2015-10-11 NOTE — Patient Instructions (Signed)
Pulmonary emphysema, unspecified emphysema type ([place]) - Stable disease - Please make sure you get the flu shot in the fall - take a sample of incruse if we have it; see if it helps you and if so we can send script  Hypotension, unspecified hypotension type - We will let Dr. Haroldine Laws be aware of the problem   Follow-up - Flu shot in the fall - Return in 6 months or sooner if needed

## 2015-10-12 ENCOUNTER — Telehealth: Payer: Self-pay | Admitting: Cardiology

## 2015-10-12 ENCOUNTER — Ambulatory Visit (INDEPENDENT_AMBULATORY_CARE_PROVIDER_SITE_OTHER): Payer: Medicare HMO | Admitting: *Deleted

## 2015-10-12 DIAGNOSIS — I255 Ischemic cardiomyopathy: Secondary | ICD-10-CM

## 2015-10-12 DIAGNOSIS — I5022 Chronic systolic (congestive) heart failure: Secondary | ICD-10-CM

## 2015-10-12 NOTE — Progress Notes (Signed)
Remote ICD transmission.   

## 2015-10-12 NOTE — Telephone Encounter (Signed)
Confirmed remote transmission w/ pt wife.   

## 2015-10-14 ENCOUNTER — Encounter: Payer: Self-pay | Admitting: Cardiology

## 2015-10-19 DIAGNOSIS — J439 Emphysema, unspecified: Secondary | ICD-10-CM | POA: Diagnosis not present

## 2015-10-19 DIAGNOSIS — E7800 Pure hypercholesterolemia, unspecified: Secondary | ICD-10-CM | POA: Diagnosis not present

## 2015-10-19 DIAGNOSIS — I429 Cardiomyopathy, unspecified: Secondary | ICD-10-CM | POA: Diagnosis not present

## 2015-10-19 DIAGNOSIS — I4892 Unspecified atrial flutter: Secondary | ICD-10-CM | POA: Diagnosis not present

## 2015-10-19 DIAGNOSIS — E039 Hypothyroidism, unspecified: Secondary | ICD-10-CM | POA: Diagnosis not present

## 2015-10-19 DIAGNOSIS — I509 Heart failure, unspecified: Secondary | ICD-10-CM | POA: Diagnosis not present

## 2015-10-19 DIAGNOSIS — Z0000 Encounter for general adult medical examination without abnormal findings: Secondary | ICD-10-CM | POA: Diagnosis not present

## 2015-10-19 DIAGNOSIS — R69 Illness, unspecified: Secondary | ICD-10-CM | POA: Diagnosis not present

## 2015-10-19 DIAGNOSIS — I4891 Unspecified atrial fibrillation: Secondary | ICD-10-CM | POA: Diagnosis not present

## 2015-10-19 DIAGNOSIS — I110 Hypertensive heart disease with heart failure: Secondary | ICD-10-CM | POA: Diagnosis not present

## 2015-10-19 LAB — CUP PACEART REMOTE DEVICE CHECK
Battery Remaining Longevity: 35 mo
Battery Remaining Percentage: 58 %
Battery Voltage: 2.92 V
Brady Statistic AP VP Percent: 97 %
Brady Statistic AS VP Percent: 2.3 %
Brady Statistic AS VS Percent: 1 %
Brady Statistic RA Percent Paced: 93 %
Date Time Interrogation Session: 20170726195404
HIGH POWER IMPEDANCE MEASURED VALUE: 66 Ohm
HighPow Impedance: 66 Ohm
Implantable Lead Implant Date: 20110516
Implantable Lead Implant Date: 20150709
Implantable Lead Location: 753858
Implantable Lead Location: 753859
Implantable Lead Location: 753860
Lead Channel Impedance Value: 430 Ohm
Lead Channel Impedance Value: 730 Ohm
Lead Channel Pacing Threshold Amplitude: 0.875 V
Lead Channel Pacing Threshold Amplitude: 1.875 V
Lead Channel Pacing Threshold Amplitude: 2 V
Lead Channel Pacing Threshold Pulse Width: 0.5 ms
Lead Channel Pacing Threshold Pulse Width: 0.5 ms
Lead Channel Sensing Intrinsic Amplitude: 11.8 mV
Lead Channel Setting Pacing Amplitude: 2 V
Lead Channel Setting Pacing Amplitude: 3 V
Lead Channel Setting Pacing Pulse Width: 0.5 ms
Lead Channel Setting Pacing Pulse Width: 0.5 ms
Lead Channel Setting Sensing Sensitivity: 0.5 mV
MDC IDC LEAD IMPLANT DT: 20150709
MDC IDC LEAD MODEL: 7122
MDC IDC MSMT LEADCHNL RA IMPEDANCE VALUE: 380 Ohm
MDC IDC MSMT LEADCHNL RA PACING THRESHOLD PULSEWIDTH: 1.2 ms
MDC IDC MSMT LEADCHNL RA SENSING INTR AMPL: 3.9 mV
MDC IDC SET LEADCHNL RA PACING AMPLITUDE: 2.5 V
MDC IDC STAT BRADY AP VS PERCENT: 1 %
Pulse Gen Serial Number: 7201810

## 2015-10-20 ENCOUNTER — Other Ambulatory Visit: Payer: Self-pay | Admitting: Internal Medicine

## 2015-10-24 ENCOUNTER — Ambulatory Visit (HOSPITAL_COMMUNITY)
Admission: RE | Admit: 2015-10-24 | Discharge: 2015-10-24 | Disposition: A | Discharge status: Home / Self Care | Payer: Medicare HMO | Source: Ambulatory Visit | Attending: Internal Medicine | Admitting: Internal Medicine

## 2015-10-24 VITALS — BP 132/70 | HR 71 | Wt 159.2 lb

## 2015-10-24 DIAGNOSIS — I255 Ischemic cardiomyopathy: Secondary | ICD-10-CM | POA: Insufficient documentation

## 2015-10-24 DIAGNOSIS — Z885 Allergy status to narcotic agent status: Secondary | ICD-10-CM | POA: Insufficient documentation

## 2015-10-24 DIAGNOSIS — I5022 Chronic systolic (congestive) heart failure: Secondary | ICD-10-CM | POA: Diagnosis not present

## 2015-10-24 DIAGNOSIS — I480 Paroxysmal atrial fibrillation: Secondary | ICD-10-CM | POA: Diagnosis not present

## 2015-10-24 DIAGNOSIS — I959 Hypotension, unspecified: Secondary | ICD-10-CM | POA: Insufficient documentation

## 2015-10-24 DIAGNOSIS — N183 Chronic kidney disease, stage 3 unspecified: Secondary | ICD-10-CM

## 2015-10-24 DIAGNOSIS — I429 Cardiomyopathy, unspecified: Secondary | ICD-10-CM | POA: Diagnosis not present

## 2015-10-24 DIAGNOSIS — I130 Hypertensive heart and chronic kidney disease with heart failure and stage 1 through stage 4 chronic kidney disease, or unspecified chronic kidney disease: Secondary | ICD-10-CM | POA: Insufficient documentation

## 2015-10-24 DIAGNOSIS — Z79899 Other long term (current) drug therapy: Secondary | ICD-10-CM | POA: Insufficient documentation

## 2015-10-24 DIAGNOSIS — Z7901 Long term (current) use of anticoagulants: Secondary | ICD-10-CM | POA: Insufficient documentation

## 2015-10-24 DIAGNOSIS — I2510 Atherosclerotic heart disease of native coronary artery without angina pectoris: Secondary | ICD-10-CM | POA: Diagnosis not present

## 2015-10-24 LAB — BASIC METABOLIC PANEL
Anion gap: 6 (ref 5–15)
BUN: 26 mg/dL — ABNORMAL HIGH (ref 6–20)
CHLORIDE: 96 mmol/L — AB (ref 101–111)
CO2: 34 mmol/L — ABNORMAL HIGH (ref 22–32)
CREATININE: 1.79 mg/dL — AB (ref 0.61–1.24)
Calcium: 9.3 mg/dL (ref 8.9–10.3)
GFR calc non Af Amer: 34 mL/min — ABNORMAL LOW (ref >60)
GFR, EST AFRICAN AMERICAN: 40 mL/min — AB (ref >60)
Glucose, Bld: 89 mg/dL (ref 65–99)
POTASSIUM: 3.7 mmol/L (ref 3.5–5.1)
Sodium: 136 mmol/L (ref 135–145)

## 2015-10-24 NOTE — Patient Instructions (Signed)
Routine lab work today. Will notify you of abnormal results   Follow up and Echo with Dr.Bensimhon in 2-3 months

## 2015-10-24 NOTE — Progress Notes (Signed)
Patient ID: Trevor Simpson, male   DOB: [birth year], 82 y.o.   MRN: [id number]    Advanced Heart Failure Clinic Note   PCP: Dr Maudie Mercury Pulmonology: Dr Chase Caller Cardiology: Dr Lovena Le  HPI: Trevor Simpson is a 82 y.o. year old with history of  COPD, symptomatic bradycardia, PAF, HTN, PAD, CVA, chronic systolic heart failure and ischemic cardiomyopathy (EF 20-25% echo 7/16)  s/p CRT-D on 09/26/13  LHC 08/2012  Left mainstem: The left main is long with 20% stenosis distally.  Left anterior descending (LAD): The left anterior descending artery is moderately calcified in the proximal vessel. There is diffuse disease in the proximal vessel up to 30%. The first diagonal is relatively small and has mild disease proximally. The ramus intermediate branch is a large branch that bifurcates in the mid vessel. The proximal vessel has a 90-95% stenosis.  Left circumflex (LCx): The left circumflex is relatively small. It gives rise to a single marginal branch and then continues in the AV groove. The first marginal branch has diffuse 60-70% stenosis proximally.  Right coronary artery (RCA): The right coronary is occluded proximally. There are right to right and left to right collaterals. Severe two-vessel obstructive coronary disease. Chronic total occlusion of the right coronary Successful stenting of the ramus intermediate branch with a bare-metal stent  He was treated for RUL pneumonia 12/14. He developed hemoptysis and his PCP stopped Xarelto and placed him on levaquin.  At that time he was referred to pulmonary. Dr Trevor Simpson Stopped levaquin and started cipro and clindamycin and coumadin. F/u CXR has shown persistent RUL opacity and there is a question if this might be asymmetric edema.  However, CT showed scarring pattern likely from prior PNA.  Admitted in 6/15 with low-output. RHC as below. Started on milrinone. In July 2015 underwent CRT-D upgrade Mean RA 11 PA 53/24  Mean PCWP 22 CI 1.5  Echo (6/15): EF 15%,  moderate central Trevor, mild to moderately decreased RV systolic function.   Milrinone stopped in September 2015 due to patient preference, despite borderline co-ox.  He presents today for HF follow up. Feels fine overall.  Has had more energy over the past few days than he has in a while.  Weight shows up 5 lbs from last visit (8 months). Stays as active as he can, but has good and bad days. Gets mild SOB when he hurries.  He states several times a week he will wake up with low BP ( as low as 80/50) and not have energy for most of the day.  Will push fluid on these days.  He is maintaining NSR on low dose amio.   EKG shows AV dual paced 72 bpm, QTc 551  Labs:  7/14 AST 27 ALT 21  11/14 K 3.8 Creatinine  1.41  12/14 Pro BNP 1100 4/14: K 3.9, creatinine 1.4, BUN 22 6/15: K 4.1, creatinine 1.2 7/15: K 3.7, creatinine 1.36, HCT 43.7, co-ox 66.5% 8/15: digoxin 1.2, co-ox 55%, K 4.0, creatinine 1.36 9/15: co-ox 56.8% 11/15 K 3.8, creatinine 1.25 10/17/14: K 3.3 creatinine 1.34   SH: Lives at home with wife  FH: Father, Brother - CAD HTN, DM        Sister - lung cancer    ROS: All systems reviewed and negative except as per HPI.   Current Outpatient Prescriptions  Medication Sig Dispense Refill  . ALPRAZolam (XANAX) 0.25 MG tablet Take 0.375 mg by mouth at bedtime.    Marland Kitchen amiodarone (PACERONE) 200 MG tablet  Take 0.5 tablets (100 mg total) by mouth daily. 45 tablet 3  . apixaban (ELIQUIS) 5 MG TABS tablet Take 1 tablet (5 mg total) by mouth 2 (two) times daily. 60 tablet 6  . atorvastatin (LIPITOR) 20 MG tablet Take 20 mg by mouth daily at 6 PM.     . B Complex-C (SUPER B COMPLEX) TABS Take 1 tablet by mouth daily.     . calcium carbonate (OS-CAL) 600 MG TABS Take 600 mg by mouth daily with breakfast.     . carvedilol (COREG) 3.125 MG tablet TAKE 1 TABLET BY MOUTH 2 TIMES A DAY WITH A MEAL 60 tablet 3  . cetirizine (ZYRTEC) 10 MG tablet Take 10 mg by mouth daily.     . digoxin (LANOXIN) 0.125  MG tablet Take 0.5 tablets (0.0625 mg total) by mouth daily. 90 tablet 3  . fluticasone (FLONASE) 50 MCG/ACT nasal spray Place 1 spray into both nostrils daily. Reported on 04/11/2015    . furosemide (LASIX) 80 MG tablet TAKE 1 TABLET BY MOUTH EVERY DAY 90 tablet 3  . levothyroxine (SYNTHROID, LEVOTHROID) 112 MCG tablet Take 112 mcg by mouth daily.    . magnesium gluconate (MAGONATE) 500 MG tablet Take 500 mg by mouth daily.     . Multiple Vitamin (MULTIVITAMIN WITH MINERALS) TABS Take 1 tablet by mouth daily.    . nitroGLYCERIN (NITROSTAT) 0.4 MG SL tablet Place 1 tablet (0.4 mg total) under the tongue every 5 (five) minutes as needed for chest pain. 25 tablet 3  . Omega-3 Fatty Acids (FISH OIL) 1200 MG CAPS Take 1,200 mg by mouth daily.     Marland Kitchen omeprazole (PRILOSEC) 20 MG capsule Take 20 mg by mouth daily as needed (indigestion).     . Saw Palmetto, Serenoa repens, (SAW PALMETTO PO) Take 1 capsule by mouth 2 (two) times daily.    Marland Kitchen umeclidinium bromide (INCRUSE ELLIPTA) 62.5 MCG/INH AEPB Inhale 1 puff into the lungs daily. 14 each 0  . vitamin C (ASCORBIC ACID) 500 MG tablet Take 500 mg by mouth daily.       No current facility-administered medications for this encounter.      Allergies  Allergen Reactions  . Codeine     "tripped out on it"  . Morphine     "tripped out on it"    Vitals:   10/24/15 1405  BP: 132/70  BP Location: Right Arm  Patient Position: Sitting  Cuff Size: Normal  Pulse: 71  SpO2: 100%  Weight: 159 lb 4 oz (72.2 kg)   Wt Readings from Last 3 Encounters:  10/24/15 159 lb 4 oz (72.2 kg)  10/11/15 150 lb 12.8 oz (68.4 kg)  06/23/15 161 lb 14.4 oz (73.4 kg)     PHYSICAL EXAM: General:  NAD. No respiratory difficulty. Family present. HEENT: normal Neck: supple. JVP flat; Carotids 2+ bilat; no bruits. No thyromegaly or nodule noted.  Cor: Distant heart sounds. Regular rate & rhythm.  2/6 HSM apex.   Lungs: Clear, normal effort Abdomen: soft, non-tender,  non-distended, no HSM. No bruits or masses. +BS  Extremities: no cyanosis, clubbing, rash, no edema Neuro: alert & oriented x 3, cranial nerves grossly intact. moves all 4 extremities w/o difficulty. Affect pleasant.   ASSESSMENT & PLAN: 1. Chronic Systolic Heart Failure: Ischemic cardiomyopathy s/p CRT-D; Echo 6/15 with EF 15%, moderate Trevor, mild to moderately decreased RV systolic function. Echo 7/16 EF 20-25% mild Trevor/AI. RV mild dysfunction  RHC in 6/15 showed CI 1.5  so patient was started on milrinone.  Milrinone stopped in 11/2013. - Improved after CRT optimization.  NYHA II-III symptoms. Weight stable. Volume status stable on exam..  - Continue carvedilol 3.125 bid. Will not uptitrate with #4. - Does not tolerate addition of ARB with hypotension. No ivabradine with HR < 70.  - Reinforced daily weights, a low Na diet, and fluid restriction (less than 2 L a day). - Knows to call the HF clinic if weight increases more than 3 lbs overnight or 5 lbs in a week.  2. PAF: S/P DC-CV 09/2012.   - Stable on low-dose amiodarone per Dr. Lovena Le. - Continue Eliquis 5 BID.  - No bleeding. 3. CAD: Extensive disease burden.  - No current signs/sx of ischemia. No CP - Continue statin. Lipids per PCP.   - No aspirin given stable CAD and use of Eliquis 4. CKD stage III - Bmet today 5. Hypotension - Episodic as described in HPI.  Sounds like he has dehydrated from time to time.  OK to push fluids.   EKG shows AV dual paced rhythm at 72 bpm  Overdue Echo. Will make follow up 2-3 months with Echo.   He has been intolerant to any attempts at medication titration and has previously required home inotropes.  Will leave regimen the same, especially in light of episodic hypotension.   Shirley Friar, PA-C 2:17 PM   Patient seen and examined with Oda Kilts, PA-C. We discussed all aspects of the encounter. I agree with the assessment and plan as stated above.   He looks good. He is improved with  recent liberalization of his fluid intake. BP very low at home (80s). Unable to tolerate meds further.   [place]uto [place],MD 11:17 PM

## 2015-11-04 ENCOUNTER — Other Ambulatory Visit (HOSPITAL_COMMUNITY): Payer: Self-pay | Admitting: Adult Health

## 2015-11-17 DIAGNOSIS — I10 Essential (primary) hypertension: Secondary | ICD-10-CM | POA: Diagnosis not present

## 2015-11-17 DIAGNOSIS — E119 Type 2 diabetes mellitus without complications: Secondary | ICD-10-CM | POA: Diagnosis not present

## 2015-11-23 DIAGNOSIS — Z0000 Encounter for general adult medical examination without abnormal findings: Secondary | ICD-10-CM | POA: Diagnosis not present

## 2015-11-23 DIAGNOSIS — Z23 Encounter for immunization: Secondary | ICD-10-CM | POA: Diagnosis not present

## 2015-11-23 DIAGNOSIS — I10 Essential (primary) hypertension: Secondary | ICD-10-CM | POA: Diagnosis not present

## 2015-11-23 DIAGNOSIS — E119 Type 2 diabetes mellitus without complications: Secondary | ICD-10-CM | POA: Diagnosis not present

## 2015-11-23 DIAGNOSIS — E7800 Pure hypercholesterolemia, unspecified: Secondary | ICD-10-CM | POA: Diagnosis not present

## 2015-11-23 DIAGNOSIS — E039 Hypothyroidism, unspecified: Secondary | ICD-10-CM | POA: Diagnosis not present

## 2015-12-12 ENCOUNTER — Other Ambulatory Visit (HOSPITAL_COMMUNITY): Payer: Self-pay | Admitting: Internal Medicine

## 2016-01-02 ENCOUNTER — Other Ambulatory Visit (HOSPITAL_COMMUNITY): Payer: Self-pay | Admitting: *Deleted

## 2016-01-02 MED ORDER — CARVEDILOL 3.125 MG PO TABS: ORAL_TABLET | ORAL | 3 refills | Status: DC

## 2016-01-11 ENCOUNTER — Telehealth: Payer: Self-pay | Admitting: Cardiology

## 2016-01-11 ENCOUNTER — Ambulatory Visit (INDEPENDENT_AMBULATORY_CARE_PROVIDER_SITE_OTHER): Payer: Medicare HMO | Admitting: *Deleted

## 2016-01-11 DIAGNOSIS — I5022 Chronic systolic (congestive) heart failure: Secondary | ICD-10-CM | POA: Diagnosis not present

## 2016-01-11 DIAGNOSIS — I2589 Other forms of chronic ischemic heart disease: Secondary | ICD-10-CM

## 2016-01-11 DIAGNOSIS — I255 Ischemic cardiomyopathy: Secondary | ICD-10-CM

## 2016-01-11 NOTE — Telephone Encounter (Signed)
Confirmed remote transmission w/ pt wife.   

## 2016-01-11 NOTE — Progress Notes (Signed)
Remote ICD transmission.   

## 2016-01-12 ENCOUNTER — Encounter: Payer: Self-pay | Admitting: Cardiology

## 2016-01-19 ENCOUNTER — Encounter (HOSPITAL_COMMUNITY): Payer: Self-pay | Admitting: Internal Medicine

## 2016-01-19 ENCOUNTER — Ambulatory Visit (HOSPITAL_COMMUNITY)
Admission: RE | Admit: 2016-01-19 | Discharge: 2016-01-19 | Disposition: A | Discharge status: Home / Self Care | Payer: Medicare HMO | Source: Ambulatory Visit | Attending: Internal Medicine | Admitting: Internal Medicine

## 2016-01-19 ENCOUNTER — Ambulatory Visit (HOSPITAL_BASED_OUTPATIENT_CLINIC_OR_DEPARTMENT_OTHER)
Admission: RE | Admit: 2016-01-19 | Discharge: 2016-01-19 | Disposition: A | Discharge status: Home / Self Care | Payer: Medicare HMO | Source: Ambulatory Visit | Attending: Internal Medicine | Admitting: Internal Medicine

## 2016-01-19 VITALS — BP 98/58 | HR 70 | Wt 155.5 lb

## 2016-01-19 DIAGNOSIS — Z833 Family history of diabetes mellitus: Secondary | ICD-10-CM | POA: Diagnosis not present

## 2016-01-19 DIAGNOSIS — I130 Hypertensive heart and chronic kidney disease with heart failure and stage 1 through stage 4 chronic kidney disease, or unspecified chronic kidney disease: Secondary | ICD-10-CM | POA: Diagnosis not present

## 2016-01-19 DIAGNOSIS — I2510 Atherosclerotic heart disease of native coronary artery without angina pectoris: Secondary | ICD-10-CM

## 2016-01-19 DIAGNOSIS — I480 Paroxysmal atrial fibrillation: Secondary | ICD-10-CM

## 2016-01-19 DIAGNOSIS — Z801 Family history of malignant neoplasm of trachea, bronchus and lung: Secondary | ICD-10-CM | POA: Diagnosis not present

## 2016-01-19 DIAGNOSIS — Z8673 Personal history of transient ischemic attack (TIA), and cerebral infarction without residual deficits: Secondary | ICD-10-CM | POA: Insufficient documentation

## 2016-01-19 DIAGNOSIS — I255 Ischemic cardiomyopathy: Secondary | ICD-10-CM | POA: Diagnosis not present

## 2016-01-19 DIAGNOSIS — I959 Hypotension, unspecified: Secondary | ICD-10-CM | POA: Insufficient documentation

## 2016-01-19 DIAGNOSIS — I5022 Chronic systolic (congestive) heart failure: Secondary | ICD-10-CM | POA: Diagnosis not present

## 2016-01-19 DIAGNOSIS — Z8249 Family history of ischemic heart disease and other diseases of the circulatory system: Secondary | ICD-10-CM | POA: Insufficient documentation

## 2016-01-19 DIAGNOSIS — Z7901 Long term (current) use of anticoagulants: Secondary | ICD-10-CM | POA: Diagnosis not present

## 2016-01-19 DIAGNOSIS — J449 Chronic obstructive pulmonary disease, unspecified: Secondary | ICD-10-CM | POA: Diagnosis not present

## 2016-01-19 DIAGNOSIS — N183 Chronic kidney disease, stage 3 (moderate): Secondary | ICD-10-CM | POA: Diagnosis not present

## 2016-01-19 DIAGNOSIS — Z95810 Presence of automatic (implantable) cardiac defibrillator: Secondary | ICD-10-CM | POA: Insufficient documentation

## 2016-01-19 DIAGNOSIS — Z885 Allergy status to narcotic agent status: Secondary | ICD-10-CM | POA: Diagnosis not present

## 2016-01-19 LAB — COMPREHENSIVE METABOLIC PANEL
ALT: 17 U/L (ref 17–63)
ANION GAP: 9 (ref 5–15)
AST: 34 U/L (ref 15–41)
Albumin: 3.7 g/dL (ref 3.5–5.0)
Alkaline Phosphatase: 66 U/L (ref 38–126)
BUN: 22 mg/dL — ABNORMAL HIGH (ref 6–20)
CHLORIDE: 96 mmol/L — AB (ref 101–111)
CO2: 32 mmol/L (ref 22–32)
CREATININE: 1.72 mg/dL — AB (ref 0.61–1.24)
Calcium: 9.1 mg/dL (ref 8.9–10.3)
GFR, EST AFRICAN AMERICAN: 41 mL/min — AB (ref >60)
GFR, EST NON AFRICAN AMERICAN: 36 mL/min — AB (ref >60)
Glucose, Bld: 87 mg/dL (ref 65–99)
POTASSIUM: 3.4 mmol/L — AB (ref 3.5–5.1)
Sodium: 137 mmol/L (ref 135–145)
Total Bilirubin: 1.7 mg/dL — ABNORMAL HIGH (ref 0.3–1.2)
Total Protein: 7.2 g/dL (ref 6.5–8.1)

## 2016-01-19 LAB — ECHOCARDIOGRAM COMPLETE: Weight: 2488 oz

## 2016-01-19 LAB — TSH: TSH: 4.049 u[IU]/mL (ref 0.350–4.500)

## 2016-01-19 LAB — DIGOXIN LEVEL: DIGOXIN LVL: 1 ng/mL (ref 0.8–2.0)

## 2016-01-19 LAB — T4, FREE: FREE T4: 1.58 ng/dL — AB (ref 0.61–1.12)

## 2016-01-19 MED ORDER — APIXABAN 2.5 MG PO TABS: 2.5000 mg | ORAL_TABLET | Freq: Two times a day (BID) | ORAL | 3 refills | Status: AC

## 2016-01-19 NOTE — Progress Notes (Signed)
  Echocardiogram 2D Echocardiogram has been performed.  Tresa Res 01/19/2016, 11:58 AM

## 2016-01-19 NOTE — Patient Instructions (Signed)
Routine lab work today. Will notify you of abnormal results  Decrease Eliquis 2.'5mg'$  twice daily.  Follow up with Dr.Bensimhon in 6 months.

## 2016-01-19 NOTE — Addendum Note (Signed)
Encounter addended by: Harvie Junior, CMA on: 01/19/2016 11:41 AM<BR>    Actions taken: Visit diagnoses modified, Order Entry activity accessed, Diagnosis association updated, Sign clinical note

## 2016-01-19 NOTE — Addendum Note (Signed)
Encounter addended by: Jolaine Artist, MD on: 01/19/2016 11:32 AM<BR>    Actions taken: Charge Capture section accepted

## 2016-01-19 NOTE — Progress Notes (Signed)
Patient ID: Trevor Simpson, male   DOB: 10/21/1933, 82 y.o.   MRN: [id number]    Advanced Heart Failure Clinic Note   PCP: Dr Maudie Mercury Pulmonology: Dr Chase Caller Cardiology: Dr Lovena Le  HPI: Mr Trevor Simpson is a 82 y.o. year old with history of  COPD, symptomatic bradycardia, PAF, HTN, PAD, CVA, chronic systolic heart failure and ischemic cardiomyopathy (EF 20-25% echo 7/16)  s/p CRT-D on 09/26/13  LHC 08/2012  Left mainstem: The left main is long with 20% stenosis distally.  Left anterior descending (LAD): The left anterior descending artery is moderately calcified in the proximal vessel. There is diffuse disease in the proximal vessel up to 30%. The first diagonal is relatively small and has mild disease proximally. The ramus intermediate branch is a large branch that bifurcates in the mid vessel. The proximal vessel has a 90-95% stenosis.  Left circumflex (LCx): The left circumflex is relatively small. It gives rise to a single marginal branch and then continues in the AV groove. The first marginal branch has diffuse 60-70% stenosis proximally.  Right coronary artery (RCA): The right coronary is occluded proximally. There are right to right and left to right collaterals. Severe two-vessel obstructive coronary disease. Chronic total occlusion of the right coronary Successful stenting of the ramus intermediate branch with a bare-metal stent  He was treated for RUL pneumonia 12/14. He developed hemoptysis and his PCP stopped Xarelto and placed him on levaquin.  At that time he was referred to pulmonary. [name] Stopped levaquin and started cipro and clindamycin and coumadin. F/u CXR has shown persistent RUL opacity and there is a question if this might be asymmetric edema.  However, CT showed scarring pattern likely from prior PNA.  Admitted in 6/15 with low-output. RHC as below. Started on milrinone. In July 2015 underwent CRT-D upgrade Mean RA 11 PA 53/24  Mean PCWP 22 CI 1.5  Echo (6/15): EF 15%,  moderate central MR, mild to moderately decreased RV systolic function.   Milrinone stopped in September 2015 due to patient preference, despite borderline co-ox.  He presents today for HF follow up. Overall doing ok but feels weaker lately. Goes to [employer] 2-3x per week. Does bike with arms and then regular bike and recumbent bike. Also does weight. Overall fairly stable. Working out in the garden as well. BP still low but better since we liberalized his intake. Sleeps in recliner due to allergies.   ICD: fluid ok activity stable 2 hours per day. CRT full time. Multiple short bursts AF 4.1%AF    Labs:  7/14 AST 27 ALT 21  11/14 K 3.8 Creatinine  1.41  12/14 Pro BNP 1100 4/14: K 3.9, creatinine 1.4, BUN 22 6/15: K 4.1, creatinine 1.2 7/15: K 3.7, creatinine 1.36, HCT 43.7, co-ox 66.5% 8/15: digoxin 1.2, co-ox 55%, K 4.0, creatinine 1.36 9/15: co-ox 56.8% 11/15 K 3.8, creatinine 1.25 10/17/14: K 3.3 creatinine 1.34   SH: Lives at home with wife  FH: Father, Brother - CAD HTN, DM        Sister - lung cancer    ROS: All systems reviewed and negative except as per HPI.   Current Outpatient Prescriptions  Medication Sig Dispense Refill  . ALPRAZolam (XANAX) 0.25 MG tablet Take 0.375 mg by mouth at bedtime.    Marland Kitchen amiodarone (PACERONE) 200 MG tablet Take 0.5 tablets (100 mg total) by mouth daily. 45 tablet 3  . apixaban (ELIQUIS) 5 MG TABS tablet Take 1 tablet (5 mg total) by mouth  2 (two) times daily. 60 tablet 6  . atorvastatin (LIPITOR) 20 MG tablet Take 20 mg by mouth daily at 6 PM.     . B Complex-C (SUPER B COMPLEX) TABS Take 1 tablet by mouth daily.     . calcium carbonate (OS-CAL) 600 MG TABS Take 600 mg by mouth daily with breakfast.     . carvedilol (COREG) 3.125 MG tablet TAKE 1 TABLET BY MOUTH 2 TIMES A DAY WITH A MEAL 60 tablet 3  . cetirizine (ZYRTEC) 10 MG tablet Take 10 mg by mouth daily.     . digoxin (LANOXIN) 0.125 MG tablet Take 0.5 tablets (0.0625 mg total) by  mouth daily. 90 tablet 3  . fluticasone (FLONASE) 50 MCG/ACT nasal spray Place 1 spray into both nostrils daily. Reported on 04/11/2015    . furosemide (LASIX) 80 MG tablet TAKE 1 TABLET BY MOUTH EVERY DAY 90 tablet 3  . levothyroxine (SYNTHROID, LEVOTHROID) 112 MCG tablet Take 112 mcg by mouth daily.    . magnesium gluconate (MAGONATE) 500 MG tablet Take 500 mg by mouth daily.     . Multiple Vitamin (MULTIVITAMIN WITH MINERALS) TABS Take 1 tablet by mouth daily.    Marland Kitchen omeprazole (PRILOSEC) 20 MG capsule Take 20 mg by mouth daily as needed (indigestion).     . Saw Palmetto, Serenoa repens, (SAW PALMETTO PO) Take 1 capsule by mouth 2 (two) times daily.    . vitamin C (ASCORBIC ACID) 500 MG tablet Take 500 mg by mouth daily.      . nitroGLYCERIN (NITROSTAT) 0.4 MG SL tablet Place 1 tablet (0.4 mg total) under the tongue every 5 (five) minutes as needed for chest pain. (Patient not taking: Reported on 01/19/2016) 25 tablet 3  . Omega-3 Fatty Acids (FISH OIL) 1200 MG CAPS Take 1,200 mg by mouth daily.      No current facility-administered medications for this encounter.      Allergies  Allergen Reactions  . Codeine     "tripped out on it"  . Morphine     "tripped out on it"    Vitals:   01/19/16 1055  BP: (!) 98/58  Pulse: 70  SpO2: 100%  Weight: 155 lb 8 oz (70.5 kg)   Wt Readings from Last 3 Encounters:  01/19/16 155 lb 8 oz (70.5 kg)  10/24/15 159 lb 4 oz (72.2 kg)  10/11/15 150 lb 12.8 oz (68.4 kg)     PHYSICAL EXAM: General:  NAD. No respiratory difficulty. Family present. HEENT: normal Neck: supple. JVP flat; Carotids 2+ bilat; no bruits. No thyromegaly or nodule noted.  Cor: Distant heart sounds. Regular rate & rhythm.  2/6 HSM apex.   Lungs: Clear, normal effort Abdomen: soft, non-tender, non-distended, no HSM. No bruits or masses. +BS  Extremities: no cyanosis, clubbing, rash, no edema Neuro: alert & oriented x 3, cranial nerves grossly intact. moves all 4 extremities  w/o difficulty. Affect pleasant.   ASSESSMENT & PLAN: 1. Chronic Systolic Heart Failure: Ischemic cardiomyopathy s/p CRT-D; Echo 6/15 with EF 15%, moderate MR, mild to moderately decreased RV systolic function. Echo 7/16 EF 20-25% mild MR/AI. RV mild dysfunction  RHC in 6/15 showed CI 1.5 so patient was started on milrinone.  Milrinone stopped in 11/2013.  Improved after CRT optimization -Chronic NYHA II-III symptoms. Weight stable. Volume status stable on exam and by Corevue..  - Continue carvedilol 3.125 bid. - Does not tolerate addition of ARB with hypotension. No ivabradine with HR < 70.  -  Suggested shorter workout 4x/week rather than 2 longer workouts per week.  - Reinforced daily weights, a low Na diet, and fluid restriction (less than 2 L a day). - Knows to call the HF clinic if weight increases more than 3 lbs overnight or 5 lbs in a week.  2. PAF: S/P DC-CV 09/2012.   - Stable on low-dose amiodarone per Dr. Lovena Le. - has 4% AF burden on ICD interrogation today - Decrease Eliquis to 2.5 BID.  - No bleeding. - Check CMET, TS4 and Free T4 today 3. CAD: Extensive disease burden.  - No current signs/sx of ischemia. No CP - Continue statin. Lipids per PCP.   - No aspirin given stable CAD and use of Eliquis 4. CKD stage III - Bmet today 5. Hypotension - Improved   Glori Bickers, MD 11:19 AM

## 2016-01-24 ENCOUNTER — Telehealth (HOSPITAL_COMMUNITY): Payer: Self-pay | Admitting: *Deleted

## 2016-01-24 MED ORDER — POTASSIUM CHLORIDE CRYS ER 20 MEQ PO TBCR: 20.0000 meq | EXTENDED_RELEASE_TABLET | Freq: Every day | ORAL | 3 refills | Status: DC

## 2016-01-24 NOTE — Telephone Encounter (Signed)
Notes Recorded by Harvie Junior, CMA on 01/24/2016 at 1:40 PM EST Patient aware.  ------  Notes Recorded by Jolaine Artist, MD on 01/20/2016 at 4:22 PM EDT Add kcl 20 daily    Ref Range & Units 5d ago 62moago 970mogo   Sodium 135 - 145 mmol/L 137  136     Potassium 3.5 - 5.1 mmol/L 3.4   3.7     Chloride 101 - 111 mmol/L 96   96      CO2 22 - 32 mmol/L 32  34      Glucose, Bld 65 - 99 mg/dL 87  89     BUN 6 - 20 mg/dL 22   26      Creatinine, Ser 0.61 - 1.24 mg/dL 1.72   1.79      Calcium 8.9 - 10.3 mg/dL 9.1  9.3     Total Protein 6.5 - 8.1 g/dL 7.2   7.4R    Albumin 3.5 - 5.0 g/dL 3.7   4.0R    AST 15 - 41 U/L 34   23R    ALT 17 - 63 U/L 17   10R    Alkaline Phosphatase 38 - 126 U/L 66   65R    Total Bilirubin 0.3 - 1.2 mg/dL 1.7    1.5R     GFR calc non Af Amer >60 mL/min 36   34      GFR calc Af Amer >60 mL/min 41   40CM

## 2016-01-24 NOTE — Telephone Encounter (Signed)
-----   Message from Jolaine Artist, MD sent at 01/20/2016  4:22 PM EDT ----- Add kcl 20 daily

## 2016-02-01 ENCOUNTER — Encounter: Payer: Self-pay | Admitting: Internal Medicine

## 2016-02-06 ENCOUNTER — Other Ambulatory Visit (HOSPITAL_COMMUNITY): Payer: Self-pay | Admitting: *Deleted

## 2016-02-06 MED ORDER — DIGOXIN 125 MCG PO TABS: 0.0625 mg | ORAL_TABLET | Freq: Every day | ORAL | 3 refills | Status: AC

## 2016-02-09 LAB — CUP PACEART REMOTE DEVICE CHECK
Battery Remaining Longevity: 32 mo
Brady Statistic AP VS Percent: 1 %
Brady Statistic AS VS Percent: 1 %
Brady Statistic RA Percent Paced: 93 %
HIGH POWER IMPEDANCE MEASURED VALUE: 66 Ohm
HIGH POWER IMPEDANCE MEASURED VALUE: 66 Ohm
Implantable Lead Implant Date: 20110516
Implantable Lead Location: 753859
Implantable Lead Location: 753860
Implantable Pulse Generator Implant Date: 20150709
Lead Channel Pacing Threshold Amplitude: 0.875 V
Lead Channel Pacing Threshold Amplitude: 2 V
Lead Channel Pacing Threshold Amplitude: 2.125 V
Lead Channel Pacing Threshold Pulse Width: 1.2 ms
Lead Channel Setting Pacing Amplitude: 3 V
Lead Channel Setting Pacing Pulse Width: 0.5 ms
Lead Channel Setting Sensing Sensitivity: 0.5 mV
MDC IDC LEAD IMPLANT DT: 20150709
MDC IDC LEAD IMPLANT DT: 20150709
MDC IDC LEAD LOCATION: 753858
MDC IDC LEAD MODEL: 7122
MDC IDC MSMT BATTERY REMAINING PERCENTAGE: 54 %
MDC IDC MSMT BATTERY VOLTAGE: 2.92 V
MDC IDC MSMT LEADCHNL LV IMPEDANCE VALUE: 730 Ohm
MDC IDC MSMT LEADCHNL LV PACING THRESHOLD PULSEWIDTH: 0.5 ms
MDC IDC MSMT LEADCHNL RA IMPEDANCE VALUE: 360 Ohm
MDC IDC MSMT LEADCHNL RA SENSING INTR AMPL: 3.2 mV
MDC IDC MSMT LEADCHNL RV IMPEDANCE VALUE: 430 Ohm
MDC IDC MSMT LEADCHNL RV PACING THRESHOLD PULSEWIDTH: 0.5 ms
MDC IDC MSMT LEADCHNL RV SENSING INTR AMPL: 11.8 mV
MDC IDC SESS DTM: 20171025185317
MDC IDC SET LEADCHNL RA PACING AMPLITUDE: 2.5 V
MDC IDC SET LEADCHNL RV PACING AMPLITUDE: 2 V
MDC IDC SET LEADCHNL RV PACING PULSEWIDTH: 0.5 ms
MDC IDC STAT BRADY AP VP PERCENT: 97 %
MDC IDC STAT BRADY AS VP PERCENT: 2.2 %
Pulse Gen Serial Number: 7201810

## 2016-02-16 ENCOUNTER — Telehealth: Payer: Self-pay | Admitting: Cardiology

## 2016-02-16 ENCOUNTER — Ambulatory Visit (HOSPITAL_BASED_OUTPATIENT_CLINIC_OR_DEPARTMENT_OTHER)
Admission: RE | Admit: 2016-02-16 | Discharge: 2016-02-16 | Disposition: A | Discharge status: Home / Self Care | Payer: Medicare HMO | Source: Ambulatory Visit | Attending: Internal Medicine | Admitting: Internal Medicine

## 2016-02-16 ENCOUNTER — Encounter (HOSPITAL_COMMUNITY): Payer: Self-pay | Admitting: Cardiology

## 2016-02-16 ENCOUNTER — Encounter (HOSPITAL_COMMUNITY): Payer: Self-pay

## 2016-02-16 VITALS — BP 110/72 | HR 84 | Wt 158.0 lb

## 2016-02-16 DIAGNOSIS — I130 Hypertensive heart and chronic kidney disease with heart failure and stage 1 through stage 4 chronic kidney disease, or unspecified chronic kidney disease: Secondary | ICD-10-CM

## 2016-02-16 DIAGNOSIS — J449 Chronic obstructive pulmonary disease, unspecified: Secondary | ICD-10-CM | POA: Insufficient documentation

## 2016-02-16 DIAGNOSIS — I959 Hypotension, unspecified: Secondary | ICD-10-CM | POA: Diagnosis not present

## 2016-02-16 DIAGNOSIS — I255 Ischemic cardiomyopathy: Secondary | ICD-10-CM | POA: Insufficient documentation

## 2016-02-16 DIAGNOSIS — N183 Chronic kidney disease, stage 3 unspecified: Secondary | ICD-10-CM

## 2016-02-16 DIAGNOSIS — I5022 Chronic systolic (congestive) heart failure: Secondary | ICD-10-CM | POA: Insufficient documentation

## 2016-02-16 DIAGNOSIS — I480 Paroxysmal atrial fibrillation: Secondary | ICD-10-CM

## 2016-02-16 DIAGNOSIS — Z7901 Long term (current) use of anticoagulants: Secondary | ICD-10-CM

## 2016-02-16 DIAGNOSIS — Z79899 Other long term (current) drug therapy: Secondary | ICD-10-CM

## 2016-02-16 DIAGNOSIS — R0789 Other chest pain: Secondary | ICD-10-CM | POA: Diagnosis not present

## 2016-02-16 DIAGNOSIS — I2510 Atherosclerotic heart disease of native coronary artery without angina pectoris: Secondary | ICD-10-CM

## 2016-02-16 DIAGNOSIS — R079 Chest pain, unspecified: Secondary | ICD-10-CM | POA: Diagnosis not present

## 2016-02-16 LAB — CBC
HCT: 41.7 % (ref 39.0–52.0)
Hemoglobin: 13.6 g/dL (ref 13.0–17.0)
MCH: 31.5 pg (ref 26.0–34.0)
MCHC: 32.6 g/dL (ref 30.0–36.0)
MCV: 96.5 fL (ref 78.0–100.0)
PLATELETS: 131 10*3/uL — AB (ref 150–400)
RBC: 4.32 MIL/uL (ref 4.22–5.81)
RDW: 14.4 % (ref 11.5–15.5)
WBC: 6.6 10*3/uL (ref 4.0–10.5)

## 2016-02-16 LAB — BASIC METABOLIC PANEL
Anion gap: 10 (ref 5–15)
BUN: 26 mg/dL — ABNORMAL HIGH (ref 6–20)
CHLORIDE: 96 mmol/L — AB (ref 101–111)
CO2: 30 mmol/L (ref 22–32)
CREATININE: 1.64 mg/dL — AB (ref 0.61–1.24)
Calcium: 8.8 mg/dL — ABNORMAL LOW (ref 8.9–10.3)
GFR calc non Af Amer: 38 mL/min — ABNORMAL LOW (ref >60)
GFR, EST AFRICAN AMERICAN: 44 mL/min — AB (ref >60)
GLUCOSE: 94 mg/dL (ref 65–99)
Potassium: 3.1 mmol/L — ABNORMAL LOW (ref 3.5–5.1)
Sodium: 136 mmol/L (ref 135–145)

## 2016-02-16 LAB — BRAIN NATRIURETIC PEPTIDE: B Natriuretic Peptide: 2316 pg/mL — ABNORMAL HIGH (ref 0.0–100.0)

## 2016-02-16 MED ORDER — POTASSIUM CHLORIDE CRYS ER 20 MEQ PO TBCR: 20.0000 meq | EXTENDED_RELEASE_TABLET | Freq: Two times a day (BID) | ORAL | 3 refills | Status: DC

## 2016-02-16 NOTE — Progress Notes (Signed)
Patient ID: Trevor Simpson, male   DOB: July 08, 1933, 82 y.o.   MRN: [id number]    Advanced Heart Failure Clinic Note   PCP: Dr Maudie Mercury Pulmonology: Dr Chase Caller Cardiology: Dr Lovena Le  HPI: Trevor Simpson is a 82 y.o. year old with history of  COPD, symptomatic bradycardia, PAF, HTN, PAD, CVA, chronic systolic heart failure and ischemic cardiomyopathy (EF 20-25% echo 7/16)  s/p CRT-D on 09/26/13  LHC 08/2012  Left mainstem: The left main is long with 20% stenosis distally.  Left anterior descending (LAD): The left anterior descending artery is moderately calcified in the proximal vessel. There is diffuse disease in the proximal vessel up to 30%. The first diagonal is relatively small and has mild disease proximally. The ramus intermediate branch is a large branch that bifurcates in the mid vessel. The proximal vessel has a 90-95% stenosis.  Left circumflex (LCx): The left circumflex is relatively small. It gives rise to a single marginal branch and then continues in the AV groove. The first marginal branch has diffuse 60-70% stenosis proximally.  Right coronary artery (RCA): The right coronary is occluded proximally. There are right to right and left to right collaterals. Severe two-vessel obstructive coronary disease. Chronic total occlusion of the right coronary Successful stenting of the ramus intermediate branch with a bare-metal stent  He was treated for RUL pneumonia 12/14. He developed hemoptysis and his PCP stopped Xarelto and placed him on levaquin.  At that time he was referred to pulmonary. [name] Stopped levaquin and started cipro and clindamycin and coumadin. F/u CXR has shown persistent RUL opacity and there is a question if this might be asymmetric edema.  However, CT showed scarring pattern likely from prior PNA.  Admitted in 6/15 with low-output. RHC as below. Started on milrinone. In July 2015 underwent CRT-D upgrade Mean RA 11 PA 53/24  Mean PCWP 22 CI 1.5  Echo (6/15): EF 15%,  moderate central Trevor, mild to moderately decreased RV systolic function.   Milrinone stopped in September 2015 due to patient preference, despite borderline co-ox.  He presents today for add on for more SOB. Has felt worse over the past couple weeks.  Feels occasional tachy palpitations. Pt extremely fatigued. But has good and bad days.  No swelling. Very weak, SOB with getting dressed or putting shoes and socks on. Continues to go to [employer], but not going as often or able to exercise for very long. Walks around the yard at times. Raked leaves up last week. BP has been OK at home. SBPs 100-110s, occasionally into 90s.  Corevue: Thoracic impedence above threshold. AT/AF burden 4.1%. No VT/VF.   Labs:  7/14 AST 27 ALT 21  11/14 K 3.8 Creatinine  1.41  12/14 Pro BNP 1100 4/14: K 3.9, creatinine 1.4, BUN 22 6/15: K 4.1, creatinine 1.2 7/15: K 3.7, creatinine 1.36, HCT 43.7, co-ox 66.5% 8/15: digoxin 1.2, co-ox 55%, K 4.0, creatinine 1.36 9/15: co-ox 56.8% 11/15 K 3.8, creatinine 1.25 10/17/14: K 3.3 creatinine 1.34   SH: Lives at home with wife  FH: Father, Brother - CAD HTN, DM        Sister - lung cancer    ROS: All systems reviewed and negative except as per HPI.   Current Outpatient Prescriptions  Medication Sig Dispense Refill  . ALPRAZolam (XANAX) 0.25 MG tablet Take 0.375 mg by mouth at bedtime.    Marland Kitchen amiodarone (PACERONE) 200 MG tablet Take 0.5 tablets (100 mg total) by mouth daily. 45 tablet 3  .  apixaban (ELIQUIS) 2.5 MG TABS tablet Take 1 tablet (2.5 mg total) by mouth 2 (two) times daily. 60 tablet 3  . atorvastatin (LIPITOR) 20 MG tablet Take 20 mg by mouth daily at 6 PM.     . B Complex-C (SUPER B COMPLEX) TABS Take 1 tablet by mouth daily.     . calcium carbonate (OS-CAL) 600 MG TABS Take 600 mg by mouth daily with breakfast.     . carvedilol (COREG) 3.125 MG tablet TAKE 1 TABLET BY MOUTH 2 TIMES A DAY WITH A MEAL 60 tablet 3  . cetirizine (ZYRTEC) 10 MG tablet Take 10  mg by mouth daily.     . digoxin (LANOXIN) 0.125 MG tablet Take 0.5 tablets (0.0625 mg total) by mouth daily. 90 tablet 3  . fluticasone (FLONASE) 50 MCG/ACT nasal spray Place 1 spray into both nostrils daily. Reported on 04/11/2015    . furosemide (LASIX) 80 MG tablet TAKE 1 TABLET BY MOUTH EVERY DAY 90 tablet 3  . levothyroxine (SYNTHROID, LEVOTHROID) 112 MCG tablet Take 112 mcg by mouth daily.    . magnesium gluconate (MAGONATE) 500 MG tablet Take 500 mg by mouth daily.     . Multiple Vitamin (MULTIVITAMIN WITH MINERALS) TABS Take 1 tablet by mouth daily.    . nitroGLYCERIN (NITROSTAT) 0.4 MG SL tablet Place 1 tablet (0.4 mg total) under the tongue every 5 (five) minutes as needed for chest pain. 25 tablet 3  . Omega-3 Fatty Acids (FISH OIL) 1200 MG CAPS Take 1,200 mg by mouth daily.     Marland Kitchen omeprazole (PRILOSEC) 20 MG capsule Take 20 mg by mouth daily as needed (indigestion).     . potassium chloride SA (K-DUR,KLOR-CON) 20 MEQ tablet Take 1 tablet (20 mEq total) by mouth daily. 90 tablet 3  . Saw Palmetto, Serenoa repens, (SAW PALMETTO PO) Take 1 capsule by mouth 2 (two) times daily.    . vitamin C (ASCORBIC ACID) 500 MG tablet Take 500 mg by mouth daily.       No current facility-administered medications for this encounter.      Allergies  Allergen Reactions  . Codeine     "tripped out on it"  . Morphine     "tripped out on it"    Vitals:   02/16/16 1039  BP: 110/72  BP Location: Left Arm  Patient Position: Sitting  Cuff Size: Normal  Pulse: 84  SpO2: 97%  Weight: 158 lb (71.7 kg)   Wt Readings from Last 3 Encounters:  02/16/16 158 lb (71.7 kg)  01/19/16 155 lb 8 oz (70.5 kg)  10/24/15 159 lb 4 oz (72.2 kg)     PHYSICAL EXAM: General:  Chronically ill and fatigued appearing.  HEENT: Normal Neck: supple. JVP does not appear elevated; Carotids 2+ bilat; no bruits. No thyromegaly or nodule noted.  Cor: Distant heart sounds. Regular. 2/6 HSM apex.   Lungs: Mildly  diminished throughout.  Abdomen: soft, NT, ND, no HSM. No bruits or masses. +BS  Extremities: no cyanosis, clubbing, rash, No peripheral edema.  Slightly cool to the touch.  Neuro: alert & oriented x 3, cranial nerves grossly intact. moves all 4 extremities w/o difficulty. Affect pleasant.   ASSESSMENT & PLAN: 1. Chronic Systolic Heart Failure: Ischemic cardiomyopathy s/p CRT-D; Echo 6/15 with EF 15%, moderate Trevor, mild to moderately decreased RV systolic function. Echo 7/16 EF 20-25% mild Trevor/AI. RV mild dysfunction  RHC in 6/15 showed CI 1.5 so patient was started on milrinone.  Milrinone  stopped in 11/2013.  Improved after CRT optimization - Chronic NYHA IIIb symptoms.  - Volume status stable by exam and Corvue.   - Echo 01/19/16 LVEF 25%, RV mod reduced - Continue carvedilol 3.125 bid for now. If output low may have to stop.  - Does not tolerate addition of ARB with hypotension. No ivabradine with HR < 70.  - Reinforced fluid restriction to < 2 L daily, sodium restriction to less than 2000 mg daily, and the importance of daily weights.   - Knows to call the HF clinic if weight increases more than 3 lbs overnight or 5 lbs in a week.  2. PAF: S/P DC-CV 09/2012.   - Stable on low-dose amiodarone per Dr. Lovena Le. - has 4.1% AF burden on ICD interrogation today.  - Continue Eliquis 2.5 BID.  - Denies bleeding.  - Recent LFTs and TFTs stable.  3. CAD: Extensive disease burden.  - No current signs/sx of ischemia. No CP - Continue statin. Lipids per PCP.   - No aspirin given stable CAD and use of Eliquis 4. CKD stage III - Pre-cath labs today.  5. Hypotension - Stable. But borderline.   Dyspneic out of proportion to his fluid status.  Most pressing concern is re-current low output.  Discussed in depth with patient and family.  Will plan for RHC early next week.  Pt may need IV milrinone again which he is OK with for now.   Pt and family aware that depending on stage of his HF, milrinone may be a  very short lived or non-effective treatment at this juncture and he may require hospitalization and further goals of care talk pending cath next week.   Discussed case with Dr. Haroldine Laws.  Shirley Friar, PA-C 10:57 AM  Total time spent > 40 minutes. Over half that time spent discussing the above.

## 2016-02-16 NOTE — Progress Notes (Signed)
Cath letter  

## 2016-02-16 NOTE — Patient Instructions (Signed)
INCREASE Potassium to 20 meq twice a day  Your physician has requested that you have a cardiac catheterization. Cardiac catheterization is used to diagnose and/or treat various heart conditions. Doctors may recommend this procedure for a number of different reasons. The most common reason is to evaluate chest pain. Chest pain can be a symptom of coronary artery disease (CAD), and cardiac catheterization can show whether plaque is narrowing or blocking your heart's arteries. This procedure is also used to evaluate the valves, as well as measure the blood flow and oxygen levels in different parts of your heart. For further information please visit [email or web address]. Please follow instruction sheet, as given.  Your physician recommends that you schedule a follow-up appointment in: 2 weeks with Rebecca Eaton

## 2016-02-16 NOTE — Telephone Encounter (Signed)
LMOVM requesting that pt send manual transmission b/c home monitor has not updated in at least 7 days.    

## 2016-02-17 ENCOUNTER — Observation Stay (HOSPITAL_COMMUNITY): Payer: Medicare HMO

## 2016-02-17 ENCOUNTER — Emergency Department (HOSPITAL_COMMUNITY): Payer: Medicare HMO

## 2016-02-17 ENCOUNTER — Encounter (HOSPITAL_COMMUNITY)
Admission: EM | Disposition: A | Discharge status: Home Health | Payer: Self-pay | Source: Home / Self Care | Attending: Internal Medicine

## 2016-02-17 ENCOUNTER — Telehealth (HOSPITAL_COMMUNITY): Payer: Self-pay | Admitting: *Deleted

## 2016-02-17 ENCOUNTER — Other Ambulatory Visit: Payer: Self-pay

## 2016-02-17 ENCOUNTER — Inpatient Hospital Stay (HOSPITAL_COMMUNITY)
Admission: EM | Admit: 2016-02-17 | Discharge: 2016-02-21 | DRG: 286 | Disposition: A | Discharge status: Home Health | Payer: Medicare HMO | Attending: Internal Medicine | Admitting: Internal Medicine

## 2016-02-17 ENCOUNTER — Encounter (HOSPITAL_COMMUNITY): Payer: Self-pay | Admitting: Emergency Medicine

## 2016-02-17 ENCOUNTER — Encounter (HOSPITAL_COMMUNITY): Payer: Self-pay

## 2016-02-17 DIAGNOSIS — K219 Gastro-esophageal reflux disease without esophagitis: Secondary | ICD-10-CM | POA: Diagnosis present

## 2016-02-17 DIAGNOSIS — R570 Cardiogenic shock: Secondary | ICD-10-CM | POA: Diagnosis not present

## 2016-02-17 DIAGNOSIS — L84 Corns and callosities: Secondary | ICD-10-CM | POA: Diagnosis present

## 2016-02-17 DIAGNOSIS — E785 Hyperlipidemia, unspecified: Secondary | ICD-10-CM | POA: Diagnosis present

## 2016-02-17 DIAGNOSIS — Z87891 Personal history of nicotine dependence: Secondary | ICD-10-CM

## 2016-02-17 DIAGNOSIS — T502X5A Adverse effect of carbonic-anhydrase inhibitors, benzothiadiazides and other diuretics, initial encounter: Secondary | ICD-10-CM | POA: Diagnosis not present

## 2016-02-17 DIAGNOSIS — Z961 Presence of intraocular lens: Secondary | ICD-10-CM | POA: Diagnosis present

## 2016-02-17 DIAGNOSIS — I2510 Atherosclerotic heart disease of native coronary artery without angina pectoris: Secondary | ICD-10-CM | POA: Diagnosis present

## 2016-02-17 DIAGNOSIS — I480 Paroxysmal atrial fibrillation: Secondary | ICD-10-CM | POA: Diagnosis present

## 2016-02-17 DIAGNOSIS — Z9842 Cataract extraction status, left eye: Secondary | ICD-10-CM

## 2016-02-17 DIAGNOSIS — Z7901 Long term (current) use of anticoagulants: Secondary | ICD-10-CM | POA: Diagnosis not present

## 2016-02-17 DIAGNOSIS — I4891 Unspecified atrial fibrillation: Secondary | ICD-10-CM | POA: Diagnosis present

## 2016-02-17 DIAGNOSIS — I5023 Acute on chronic systolic (congestive) heart failure: Secondary | ICD-10-CM | POA: Diagnosis present

## 2016-02-17 DIAGNOSIS — M79671 Pain in right foot: Secondary | ICD-10-CM | POA: Diagnosis not present

## 2016-02-17 DIAGNOSIS — I499 Cardiac arrhythmia, unspecified: Secondary | ICD-10-CM | POA: Diagnosis present

## 2016-02-17 DIAGNOSIS — Z452 Encounter for adjustment and management of vascular access device: Secondary | ICD-10-CM | POA: Diagnosis not present

## 2016-02-17 DIAGNOSIS — Z8249 Family history of ischemic heart disease and other diseases of the circulatory system: Secondary | ICD-10-CM

## 2016-02-17 DIAGNOSIS — Z885 Allergy status to narcotic agent status: Secondary | ICD-10-CM

## 2016-02-17 DIAGNOSIS — I130 Hypertensive heart and chronic kidney disease with heart failure and stage 1 through stage 4 chronic kidney disease, or unspecified chronic kidney disease: Principal | ICD-10-CM | POA: Diagnosis present

## 2016-02-17 DIAGNOSIS — N183 Chronic kidney disease, stage 3 unspecified: Secondary | ICD-10-CM | POA: Diagnosis present

## 2016-02-17 DIAGNOSIS — Z8371 Family history of colonic polyps: Secondary | ICD-10-CM

## 2016-02-17 DIAGNOSIS — Z7951 Long term (current) use of inhaled steroids: Secondary | ICD-10-CM

## 2016-02-17 DIAGNOSIS — Z833 Family history of diabetes mellitus: Secondary | ICD-10-CM

## 2016-02-17 DIAGNOSIS — F419 Anxiety disorder, unspecified: Secondary | ICD-10-CM | POA: Diagnosis present

## 2016-02-17 DIAGNOSIS — Z951 Presence of aortocoronary bypass graft: Secondary | ICD-10-CM

## 2016-02-17 DIAGNOSIS — M19022 Primary osteoarthritis, left elbow: Secondary | ICD-10-CM | POA: Diagnosis present

## 2016-02-17 DIAGNOSIS — I509 Heart failure, unspecified: Secondary | ICD-10-CM

## 2016-02-17 DIAGNOSIS — Z95810 Presence of automatic (implantable) cardiac defibrillator: Secondary | ICD-10-CM | POA: Diagnosis present

## 2016-02-17 DIAGNOSIS — R52 Pain, unspecified: Secondary | ICD-10-CM

## 2016-02-17 DIAGNOSIS — I255 Ischemic cardiomyopathy: Secondary | ICD-10-CM | POA: Diagnosis present

## 2016-02-17 DIAGNOSIS — I429 Cardiomyopathy, unspecified: Secondary | ICD-10-CM

## 2016-02-17 DIAGNOSIS — J439 Emphysema, unspecified: Secondary | ICD-10-CM | POA: Diagnosis present

## 2016-02-17 DIAGNOSIS — R079 Chest pain, unspecified: Secondary | ICD-10-CM | POA: Diagnosis not present

## 2016-02-17 DIAGNOSIS — I10 Essential (primary) hypertension: Secondary | ICD-10-CM | POA: Diagnosis present

## 2016-02-17 DIAGNOSIS — I481 Persistent atrial fibrillation: Secondary | ICD-10-CM | POA: Diagnosis not present

## 2016-02-17 DIAGNOSIS — Z8673 Personal history of transient ischemic attack (TIA), and cerebral infarction without residual deficits: Secondary | ICD-10-CM

## 2016-02-17 DIAGNOSIS — Z9841 Cataract extraction status, right eye: Secondary | ICD-10-CM

## 2016-02-17 DIAGNOSIS — Z801 Family history of malignant neoplasm of trachea, bronchus and lung: Secondary | ICD-10-CM

## 2016-02-17 DIAGNOSIS — Z800 Family history of malignant neoplasm of digestive organs: Secondary | ICD-10-CM

## 2016-02-17 DIAGNOSIS — E039 Hypothyroidism, unspecified: Secondary | ICD-10-CM | POA: Diagnosis present

## 2016-02-17 DIAGNOSIS — I739 Peripheral vascular disease, unspecified: Secondary | ICD-10-CM | POA: Diagnosis present

## 2016-02-17 DIAGNOSIS — E876 Hypokalemia: Secondary | ICD-10-CM | POA: Diagnosis not present

## 2016-02-17 DIAGNOSIS — J90 Pleural effusion, not elsewhere classified: Secondary | ICD-10-CM | POA: Diagnosis not present

## 2016-02-17 DIAGNOSIS — I252 Old myocardial infarction: Secondary | ICD-10-CM

## 2016-02-17 DIAGNOSIS — I639 Cerebral infarction, unspecified: Secondary | ICD-10-CM | POA: Diagnosis present

## 2016-02-17 DIAGNOSIS — M19012 Primary osteoarthritis, left shoulder: Secondary | ICD-10-CM | POA: Diagnosis present

## 2016-02-17 DIAGNOSIS — R69 Illness, unspecified: Secondary | ICD-10-CM | POA: Diagnosis not present

## 2016-02-17 DIAGNOSIS — R0789 Other chest pain: Secondary | ICD-10-CM | POA: Diagnosis not present

## 2016-02-17 DIAGNOSIS — Z955 Presence of coronary angioplasty implant and graft: Secondary | ICD-10-CM

## 2016-02-17 HISTORY — PX: CARDIAC CATHETERIZATION: SHX172

## 2016-02-17 HISTORY — PX: CENTRAL VENOUS CATHETER INSERTION: SHX401

## 2016-02-17 LAB — CBC
HCT: 40.8 % (ref 39.0–52.0)
Hemoglobin: 13.6 g/dL (ref 13.0–17.0)
MCH: 31.9 pg (ref 26.0–34.0)
MCHC: 33.3 g/dL (ref 30.0–36.0)
MCV: 95.8 fL (ref 78.0–100.0)
PLATELETS: 134 10*3/uL — AB (ref 150–400)
RBC: 4.26 MIL/uL (ref 4.22–5.81)
RDW: 14.5 % (ref 11.5–15.5)
WBC: 7 10*3/uL (ref 4.0–10.5)

## 2016-02-17 LAB — BASIC METABOLIC PANEL
Anion gap: 10 (ref 5–15)
BUN: 28 mg/dL — ABNORMAL HIGH (ref 6–20)
CALCIUM: 8.9 mg/dL (ref 8.9–10.3)
CHLORIDE: 97 mmol/L — AB (ref 101–111)
CO2: 29 mmol/L (ref 22–32)
CREATININE: 1.71 mg/dL — AB (ref 0.61–1.24)
GFR calc non Af Amer: 36 mL/min — ABNORMAL LOW (ref >60)
GFR, EST AFRICAN AMERICAN: 42 mL/min — AB (ref >60)
Glucose, Bld: 98 mg/dL (ref 65–99)
Potassium: 3.3 mmol/L — ABNORMAL LOW (ref 3.5–5.1)
SODIUM: 136 mmol/L (ref 135–145)

## 2016-02-17 LAB — POCT I-STAT 3, VENOUS BLOOD GAS (G3P V)
Acid-Base Excess: 5 mmol/L — ABNORMAL HIGH (ref 0.0–2.0)
Acid-Base Excess: 5 mmol/L — ABNORMAL HIGH (ref 0.0–2.0)
Bicarbonate: 30.1 mmol/L — ABNORMAL HIGH (ref 20.0–28.0)
Bicarbonate: 30.6 mmol/L — ABNORMAL HIGH (ref 20.0–28.0)
O2 Saturation: 41 %
O2 Saturation: 43 %
TCO2: 31 mmol/L (ref 0–100)
TCO2: 32 mmol/L (ref 0–100)
pCO2, Ven: 45.6 mmHg (ref 44.0–60.0)
pCO2, Ven: 45.9 mmHg (ref 44.0–60.0)
pH, Ven: 7.425 (ref 7.250–7.430)
pH, Ven: 7.435 — ABNORMAL HIGH (ref 7.250–7.430)
pO2, Ven: 23 mmHg — CL (ref 32.0–45.0)
pO2, Ven: 24 mmHg — CL (ref 32.0–45.0)

## 2016-02-17 LAB — I-STAT TROPONIN, ED: TROPONIN I, POC: 0.01 ng/mL (ref 0.00–0.08)

## 2016-02-17 LAB — TROPONIN I
Troponin I: 0.04 ng/mL (ref <0.03)
Troponin I: 0.04 ng/mL (ref <0.03)

## 2016-02-17 SURGERY — RIGHT HEART CATH
Anesthesia: LOCAL

## 2016-02-17 MED ORDER — ALPRAZOLAM 0.25 MG PO TABS
0.3750 mg | ORAL_TABLET | Freq: Every day | ORAL | Status: DC
  Administered 2016-02-17 – 2016-02-20 (×4): 0.375 mg via ORAL
  Filled 2016-02-17 (×4): qty 2

## 2016-02-17 MED ORDER — LEVOTHYROXINE SODIUM 112 MCG PO TABS
112.0000 ug | ORAL_TABLET | Freq: Every day | ORAL | Status: DC
  Administered 2016-02-18 – 2016-02-21 (×4): 112 ug via ORAL
  Filled 2016-02-17 (×4): qty 1

## 2016-02-17 MED ORDER — GI COCKTAIL ~~LOC~~: 30.0000 mL | Freq: Four times a day (QID) | ORAL | Status: DC | PRN

## 2016-02-17 MED ORDER — DOXYLAMINE SUCCINATE (SLEEP) 25 MG PO TABS
25.0000 mg | ORAL_TABLET | Freq: Every day | ORAL | Status: DC
  Filled 2016-02-17: qty 1

## 2016-02-17 MED ORDER — CARVEDILOL 3.125 MG PO TABS: 3.1250 mg | ORAL_TABLET | Freq: Every day | ORAL | Status: DC

## 2016-02-17 MED ORDER — HEPARIN (PORCINE) IN NACL 2-0.9 UNIT/ML-% IJ SOLN
INTRAMUSCULAR | Status: DC | PRN
  Administered 2016-02-17: 18:00:00

## 2016-02-17 MED ORDER — SODIUM CHLORIDE 0.9% FLUSH: 3.0000 mL | INTRAVENOUS | Status: DC | PRN

## 2016-02-17 MED ORDER — LIDOCAINE HCL (PF) 1 % IJ SOLN
INTRAMUSCULAR | Status: AC
  Filled 2016-02-17: qty 30

## 2016-02-17 MED ORDER — POTASSIUM CHLORIDE CRYS ER 20 MEQ PO TBCR: 20.0000 meq | EXTENDED_RELEASE_TABLET | Freq: Two times a day (BID) | ORAL | Status: DC

## 2016-02-17 MED ORDER — SODIUM CHLORIDE 0.9 % IV SOLN: 250.0000 mL | INTRAVENOUS | Status: DC | PRN

## 2016-02-17 MED ORDER — POTASSIUM CHLORIDE CRYS ER 20 MEQ PO TBCR
20.0000 meq | EXTENDED_RELEASE_TABLET | Freq: Two times a day (BID) | ORAL | Status: DC
  Administered 2016-02-17 – 2016-02-18 (×3): 20 meq via ORAL
  Filled 2016-02-17 (×3): qty 1

## 2016-02-17 MED ORDER — ATORVASTATIN CALCIUM 20 MG PO TABS
20.0000 mg | ORAL_TABLET | Freq: Every day | ORAL | Status: DC
  Administered 2016-02-18 – 2016-02-19 (×2): 20 mg via ORAL
  Filled 2016-02-17 (×4): qty 1

## 2016-02-17 MED ORDER — MAGNESIUM GLUCONATE 500 MG PO TABS
500.0000 mg | ORAL_TABLET | Freq: Every day | ORAL | Status: DC
Start: 2016-02-18 — End: 2016-02-21
  Administered 2016-02-18 – 2016-02-21 (×4): 500 mg via ORAL
  Filled 2016-02-17 (×4): qty 1

## 2016-02-17 MED ORDER — ASPIRIN 81 MG PO CHEW: 81.0000 mg | CHEWABLE_TABLET | ORAL | Status: DC

## 2016-02-17 MED ORDER — SODIUM CHLORIDE 0.9 % IV SOLN: INTRAVENOUS | Status: DC

## 2016-02-17 MED ORDER — ADULT MULTIVITAMIN W/MINERALS CH
1.0000 | ORAL_TABLET | Freq: Every day | ORAL | Status: DC
  Administered 2016-02-18 – 2016-02-21 (×4): 1 via ORAL
  Filled 2016-02-17 (×4): qty 1

## 2016-02-17 MED ORDER — HEPARIN (PORCINE) IN NACL 2-0.9 UNIT/ML-% IJ SOLN
INTRAMUSCULAR | Status: AC
  Filled 2016-02-17: qty 500

## 2016-02-17 MED ORDER — SODIUM CHLORIDE 0.9% FLUSH
10.0000 mL | Freq: Two times a day (BID) | INTRAVENOUS | Status: DC
  Administered 2016-02-18 – 2016-02-21 (×7): 10 mL

## 2016-02-17 MED ORDER — ONDANSETRON HCL 4 MG/2ML IJ SOLN: 4.0000 mg | Freq: Four times a day (QID) | INTRAMUSCULAR | Status: DC | PRN

## 2016-02-17 MED ORDER — SODIUM CHLORIDE 0.9% FLUSH: 3.0000 mL | Freq: Two times a day (BID) | INTRAVENOUS | Status: DC

## 2016-02-17 MED ORDER — PANTOPRAZOLE SODIUM 40 MG PO TBEC
40.0000 mg | DELAYED_RELEASE_TABLET | Freq: Every day | ORAL | Status: DC
  Administered 2016-02-20 – 2016-02-21 (×2): 40 mg via ORAL
  Filled 2016-02-17 (×4): qty 1

## 2016-02-17 MED ORDER — SODIUM CHLORIDE 0.9% FLUSH
3.0000 mL | Freq: Two times a day (BID) | INTRAVENOUS | Status: DC
  Administered 2016-02-17 – 2016-02-21 (×6): 3 mL via INTRAVENOUS

## 2016-02-17 MED ORDER — FUROSEMIDE 80 MG PO TABS: 80.0000 mg | ORAL_TABLET | Freq: Every day | ORAL | Status: DC

## 2016-02-17 MED ORDER — DIGOXIN 125 MCG PO TABS: 0.0625 mg | ORAL_TABLET | Freq: Every day | ORAL | Status: DC

## 2016-02-17 MED ORDER — FUROSEMIDE 10 MG/ML IJ SOLN
80.0000 mg | Freq: Two times a day (BID) | INTRAMUSCULAR | Status: DC
  Administered 2016-02-17 – 2016-02-18 (×3): 80 mg via INTRAVENOUS
  Filled 2016-02-17 (×3): qty 8

## 2016-02-17 MED ORDER — SODIUM CHLORIDE 0.9% FLUSH
3.0000 mL | Freq: Two times a day (BID) | INTRAVENOUS | Status: DC
  Administered 2016-02-17 – 2016-02-21 (×5): 3 mL via INTRAVENOUS

## 2016-02-17 MED ORDER — LORATADINE 10 MG PO TABS
10.0000 mg | ORAL_TABLET | Freq: Every day | ORAL | Status: DC | PRN
  Administered 2016-02-18 – 2016-02-19 (×3): 10 mg via ORAL
  Filled 2016-02-17 (×2): qty 1

## 2016-02-17 MED ORDER — ACETAMINOPHEN 325 MG PO TABS: 650.0000 mg | ORAL_TABLET | ORAL | Status: DC | PRN

## 2016-02-17 MED ORDER — DIGOXIN 125 MCG PO TABS
0.0625 mg | ORAL_TABLET | Freq: Every day | ORAL | Status: DC
  Administered 2016-02-18 – 2016-02-21 (×4): 0.0625 mg via ORAL
  Filled 2016-02-17 (×4): qty 1

## 2016-02-17 MED ORDER — SODIUM CHLORIDE 0.9% FLUSH
10.0000 mL | INTRAVENOUS | Status: DC | PRN
Start: 2016-02-17 — End: 2016-02-21

## 2016-02-17 MED ORDER — HEPARIN (PORCINE) IN NACL 100-0.45 UNIT/ML-% IJ SOLN
900.0000 [IU]/h | INTRAMUSCULAR | Status: DC
  Administered 2016-02-18: 900 [IU]/h via INTRAVENOUS
  Filled 2016-02-17 (×2): qty 250

## 2016-02-17 MED ORDER — AMIODARONE HCL 100 MG PO TABS
100.0000 mg | ORAL_TABLET | Freq: Every day | ORAL | Status: DC
  Administered 2016-02-18 – 2016-02-21 (×4): 100 mg via ORAL
  Filled 2016-02-17 (×4): qty 1

## 2016-02-17 MED ORDER — APIXABAN 2.5 MG PO TABS: 2.5000 mg | ORAL_TABLET | Freq: Two times a day (BID) | ORAL | Status: DC

## 2016-02-17 MED ORDER — MILRINONE LACTATE IN DEXTROSE 20-5 MG/100ML-% IV SOLN
0.3750 ug/kg/min | INTRAVENOUS | Status: DC
  Administered 2016-02-18: 0.25 ug/kg/min via INTRAVENOUS
  Administered 2016-02-19 – 2016-02-21 (×4): 0.375 ug/kg/min via INTRAVENOUS
  Filled 2016-02-17: qty 100
  Filled 2016-02-17: qty 200
  Filled 2016-02-17 (×5): qty 100

## 2016-02-17 MED ORDER — ZOLPIDEM TARTRATE 5 MG PO TABS
5.0000 mg | ORAL_TABLET | Freq: Every day | ORAL | Status: DC
  Filled 2016-02-17 (×4): qty 1

## 2016-02-17 MED ORDER — LEVETIRACETAM 500 MG PO TABS: 500.0000 mg | ORAL_TABLET | Freq: Once | ORAL | Status: DC

## 2016-02-17 MED ORDER — LIDOCAINE HCL (PF) 1 % IJ SOLN
INTRAMUSCULAR | Status: DC | PRN
  Administered 2016-02-17: 5 mL via SUBCUTANEOUS

## 2016-02-17 MED ORDER — FLUTICASONE PROPIONATE 50 MCG/ACT NA SUSP
1.0000 | Freq: Every day | NASAL | Status: DC | PRN
  Administered 2016-02-18: 1 via NASAL
  Filled 2016-02-17 (×2): qty 16

## 2016-02-17 SURGICAL SUPPLY — 12 items
CATH SWAN GANZ 7F STRAIGHT (CATHETERS) ×1 IMPLANT
KIT HEART RIGHT NAMIC (KITS) ×3 IMPLANT
PACK CARDIAC CATHETERIZATION (CUSTOM PROCEDURE TRAY) ×3 IMPLANT
PROTECTION STATION PRESSURIZED (MISCELLANEOUS) ×3
SHEATH PINNACLE 7F 10CM (SHEATH) ×1 IMPLANT
SLEEVE REPOSITIONING LENGTH 30 (MISCELLANEOUS) ×1 IMPLANT
STATION PROTECTION PRESSURIZED (MISCELLANEOUS) IMPLANT
TRANSDUCER W/STOPCOCK (MISCELLANEOUS) ×5 IMPLANT
TRAY CATH 3LUMEN 20C SULFAFREE (CATHETERS) ×1 IMPLANT
TUBING ART PRESS 72  MALE/FEM (TUBING) ×1
TUBING ART PRESS 72 MALE/FEM (TUBING) IMPLANT
WIRE EMERALD 3MM-J .025X260CM (WIRE) ×1 IMPLANT

## 2016-02-17 NOTE — Progress Notes (Signed)
ANTICOAGULATION CONSULT NOTE - Initial Consult  Pharmacy Consult for Heparin (Apixaban on hold) Indication: ACS/ STEMI and atrial fibrillation  Allergies  Allergen Reactions  . Codeine     "tripped out on it"  . Morphine     "tripped out on it"    Patient Measurements: Height: '5\' 11"'$  (180.3 cm) Weight: 157 lb 6.4 oz (71.4 kg) IBW/kg (Calculated) : 75.3 Heparin Dosing Weight: 71.4 kg  Vital Signs: Temp: 97.4 F (36.3 C) (12/01 2055) Temp Source: Oral (12/01 2055) BP: 131/68 (12/01 2055) Pulse Rate: 72 (12/01 2055)  Labs:  Recent Labs  02/16/16 1134 02/17/16 1131 02/17/16 1604  HGB 13.6 13.6  --   HCT 41.7 40.8  --   PLT 131* 134*  --   CREATININE 1.64* 1.71*  --   TROPONINI  --   --  0.04*    Estimated Creatinine Clearance: 34.8 mL/min (by C-G formula based on SCr of 1.71 mg/dL (H)).   Medical History: Past Medical History:  Diagnosis Date  . AAA (abdominal aortic aneurysm) ([place])    repaired  . Anxiety   . Arthritis    "left elbow and shoulder" (09/24/2013)  . Atrial fibrillation ([place])   . Automatic implantable cardioverter-defibrillator in situ   . Carotid artery occlusion   . Colon polyp   . Congestive heart failure ([place])   . Coronary artery disease    a. LHC (08/2012): Lmain: long, 20% stenosis distally LAD: mod calcified and mild dz prox. diff dz proximally 30%, first diagonal mild dz prox and small, Ramus intermediate brach lg bifurcates mid vessel prox. 90-95% stenosis LCx: relatively sml, rise to single marginal and contiine AV groove, 1st marg. diff 60-70% dz stenosis prox RCA: occ. prox. R-L coll, successful stent remus interm branch BMS  . GERD (gastroesophageal reflux disease)   . H/O blood clots   . HCAP (healthcare-associated pneumonia) 2014  . Hyperlipidemia   . Hypertension   . Hypothyroidism   . Ischemic cardiomyopathy   . Myocardial infarction   . Other and unspecified hyperlipidemia   . Pneumonia   . PVD (peripheral vascular disease)  ([place])    s/p renal artery stent 20-04  . Sinoatrial node dysfunction ([facility])   . Stroke [facility]) 2014   left sided affected no residual effects  . Systolic congestive heart failure ([place])    a. ICM b.    Assessment:   82 yr old male to begin IV heparin ~8hrs after sheath out after RHC.   No sheath. Right IJ access for procedure.   On Apixaban 2.5 mg BID pta for afib.  Last dose 12/1 at 8:30am.   Per procedure note, hold Apixaban, begin IV heparin in am.   Recent Apixaban doses likely to effect heparin levels, so will use aPTTs for heparin monitoring.   To begin Milrinone.  Goal of Therapy:  Heparin level 0.3-0.7 units/ml aPTT 66-102 seconds Monitor platelets by anticoagulation protocol: Yes   Plan:   Begin heparin drip at 900 units/hr ~6am on 12/2.  Heparin level and aPTT ~6 hrs after drip begins.  Daily heparin level and aPTT until values are correlating.    Arty Baumgartner, [place] Pager: [phone] 02/17/2016,10:36 PM

## 2016-02-17 NOTE — Telephone Encounter (Signed)
RHC pre cert pending through [employer]. Clinical documentation faxed as requested.

## 2016-02-17 NOTE — Consult Note (Signed)
Advanced Heart Failure Team Consult Note  Referring Physician: Dr Nehemiah Settle Primary Physician: Dr Maudie Mercury  Primary Cardiologist:  Dr Haroldine Laws  Reason for Consultation: Heart Failure   HPI:    Mr Stejskal is a 82 y.o. year old with history of  COPD, symptomatic bradycardia, PAF, HTN, PAD, CVA, chronic systolic heart failure and ischemic cardiomyopathy (EF 20-25% echo 7/16)  s/p CRT-D on 09/26/13.   [place] 2014  Left mainstem: The left main is long with 20% stenosis distally.  Left anterior descending (LAD): The left anterior descending artery is moderately calcified in the proximal vessel. There is diffuse disease in the proximal vessel up to 30%. The first diagonal is relatively small and has mild disease proximally. The ramus intermediate branch is a large branch that bifurcates in the mid vessel. The proximal vessel has a 90-95% stenosis.  Left circumflex (LCx): The left circumflex is relatively small. It gives rise to a single marginal branch and then continues in the AV groove. The first marginal branch has diffuse 60-70% stenosis proximally.  Right coronary artery (RCA): The right coronary is occluded proximally. There are right to right and left to right collaterals. Severe two-vessel obstructive coronary disease. Chronic total occlusion of the right coronary Successful stenting of the ramus intermediate branch with a bare-metal stent  Admitted June 2015 with low output HF. Started on short term milrinone. Milrinone was discontinued September 2015.  Yesterday he was seen in the HF clinic.Due to increased fatigue and dyspnea he was set up for RHC next week.  Today he presented to the ED with increased dyspnea and chest pain.  Very fatigued. Dyspneic with any activity   Pertinent admission labs include: K 3.3, creatinine 1.7, Hgb 13.6, troponin 0.01.   ECHO 01/19/2016 EF 25%   Review of Systems: [y] = yes, '[ ]'$  = no   General: Weight gain '[ ]'$ ; Weight loss '[ ]'$ ; Anorexia '[ ]'$ ; Fatigue [Y]; Fever  '[ ]'$ ; Chills '[ ]'$ ; Weakness [T ]  Cardiac: Chest pain/pressure '[ ]'$ ; Resting SOB [ Y]; Exertional SOB [Y ]; Orthopnea [Y ]; Pedal Edema [ Y]; Palpitations '[ ]'$ ; Syncope '[ ]'$ ; Presyncope '[ ]'$ ; Paroxysmal nocturnal dyspnea'[ ]'$   Pulmonary: Cough '[ ]'$ ; Wheezing'[ ]'$ ; Hemoptysis'[ ]'$ ; Sputum '[ ]'$ ; Snoring '[ ]'$   GI: Vomiting'[ ]'$ ; Dysphagia'[ ]'$ ; Melena'[ ]'$ ; Hematochezia '[ ]'$ ; Heartburn'[ ]'$ ; Abdominal pain '[ ]'$ ; Constipation '[ ]'$ ; Diarrhea '[ ]'$ ; BRBPR '[ ]'$   GU: Hematuria'[ ]'$ ; Dysuria '[ ]'$ ; Nocturia'[ ]'$   Vascular: Pain in legs with walking '[ ]'$ ; Pain in feet with lying flat '[ ]'$ ; Non-healing sores '[ ]'$ ; Stroke '[ ]'$ ; TIA '[ ]'$ ; Slurred speech '[ ]'$ ;  Neuro: Headaches'[ ]'$ ; Vertigo'[ ]'$ ; Seizures'[ ]'$ ; Paresthesias'[ ]'$ ;Blurred vision '[ ]'$ ; Diplopia '[ ]'$ ; Vision changes '[ ]'$   Ortho/Skin: Arthritis [Y ]; Joint pain [Y ]; Muscle pain '[ ]'$ ; Joint swelling '[ ]'$ ; Back Pain '[ ]'$ ; Rash '[ ]'$   Psych: Depression'[ ]'$ ; Anxiety'[ ]'$   Heme: Bleeding problems '[ ]'$ ; Clotting disorders '[ ]'$ ; Anemia '[ ]'$   Endocrine: Diabetes '[ ]'$ ; Thyroid dysfunction'[ ]'$   Home Medications Prior to Admission medications   Medication Sig Start Date End Date Taking? Authorizing Provider  ALPRAZolam (XANAX) 0.25 MG tablet Take 0.375 mg by mouth at bedtime.   Yes Historical Provider, MD  amiodarone (PACERONE) 200 MG tablet Take 0.5 tablets (100 mg total) by mouth daily. 05/27/15  Yes Jolaine Artist, MD  apixaban (ELIQUIS) 2.5 MG TABS tablet Take 1 tablet (2.5 mg total) by  mouth 2 (two) times daily. 01/19/16  Yes Jolaine Artist, MD  atorvastatin (LIPITOR) 20 MG tablet Take 20 mg by mouth daily at 6 PM.    Yes Historical Provider, MD  B Complex-C (SUPER B COMPLEX) TABS Take 1 tablet by mouth daily.    Yes Historical Provider, MD  calcium carbonate (OS-CAL) 600 MG TABS Take 600 mg by mouth daily with breakfast.    Yes Historical Provider, MD  carvedilol (COREG) 3.125 MG tablet TAKE 1 TABLET BY MOUTH 2 TIMES A DAY WITH A MEAL Patient taking differently: Take 3.125 mg by mouth daily.  01/02/16  Yes  Jolaine Artist, MD  cetirizine (ZYRTEC) 10 MG tablet Take 10 mg by mouth daily.  08/24/10  Yes Historical Provider, MD  digoxin (LANOXIN) 0.125 MG tablet Take 0.5 tablets (0.0625 mg total) by mouth daily. 02/06/16  Yes Larey Dresser, MD  doxylamine, Sleep, (UNISOM) 25 MG tablet Take 25 mg by mouth at bedtime.   Yes Historical Provider, MD  fluticasone (FLONASE) 50 MCG/ACT nasal spray Place 1 spray into both nostrils daily as needed for allergies. Reported on 04/11/2015 09/23/14  Yes Historical Provider, MD  furosemide (LASIX) 80 MG tablet TAKE 1 TABLET BY MOUTH EVERY DAY 10/21/15  Yes Jolaine Artist, MD  levothyroxine (SYNTHROID, LEVOTHROID) 112 MCG tablet Take 112 mcg by mouth See admin instructions. Pt takes Monday- Saturday - skips on Sundays   Yes Historical Provider, MD  magnesium gluconate (MAGONATE) 500 MG tablet Take 500 mg by mouth daily.    Yes Historical Provider, MD  Multiple Vitamin (MULTIVITAMIN WITH MINERALS) TABS Take 1 tablet by mouth daily.   Yes Historical Provider, MD  nitroGLYCERIN (NITROSTAT) 0.4 MG SL tablet Place 1 tablet (0.4 mg total) under the tongue every 5 (five) minutes as needed for chest pain. 09/14/15  Yes Evans Lance, MD  Omega-3 Fatty Acids (FISH OIL) 1200 MG CAPS Take 1,200 mg by mouth daily.    Yes Historical Provider, MD  omeprazole (PRILOSEC) 20 MG capsule Take 20 mg by mouth daily as needed (indigestion).    Yes Historical Provider, MD  potassium chloride SA (K-DUR,KLOR-CON) 20 MEQ tablet Take 1 tablet (20 mEq total) by mouth 2 (two) times daily. 02/16/16  Yes Shirley Friar, PA-C  Saw Palmetto, Serenoa repens, (SAW PALMETTO PO) Take 1 capsule by mouth 2 (two) times daily.   Yes Historical Provider, MD  vitamin C (ASCORBIC ACID) 500 MG tablet Take 500 mg by mouth daily.     Yes Historical Provider, MD    Past Medical History: Past Medical History:  Diagnosis Date  . AAA (abdominal aortic aneurysm) ([place])    repaired  . Anxiety   . Arthritis     "left elbow and shoulder" (09/24/2013)  . Atrial fibrillation ([place])   . Automatic implantable cardioverter-defibrillator in situ   . Carotid artery occlusion   . Colon polyp   . Congestive heart failure ([place])   . Coronary artery disease    a. LHC (08/2012): [place]: long, 20% stenosis distally LAD: mod calcified and mild dz prox. diff dz proximally 30%, first diagonal mild dz prox and small, Ramus intermediate brach lg bifurcates mid vessel prox. 90-95% stenosis LCx: relatively sml, rise to single marginal and contiine AV groove, 1st marg. diff 60-70% dz stenosis prox RCA: occ. prox. R-L coll, successful stent remus interm branch BMS  . GERD (gastroesophageal reflux disease)   . H/O blood clots   . HCAP (healthcare-associated pneumonia) 2014  .  Hyperlipidemia   . Hypertension   . Hypothyroidism   . Ischemic cardiomyopathy   . Myocardial infarction   . Other and unspecified hyperlipidemia   . Pneumonia   . PVD (peripheral vascular disease) ([place])    s/p renal artery stent 20-04  . Sinoatrial node dysfunction ([facility])   . Stroke [facility]) 2014   left sided affected no residual effects  . Systolic congestive heart failure ([place])    a. ICM b.     Past Surgical History: Past Surgical History:  Procedure Laterality Date  . ABDOMINAL AORTIC ANEURYSM REPAIR  05/17/1993  . BI-VENTRICULAR IMPLANTABLE CARDIOVERTER DEFIBRILLATOR N/A 09/24/2013   Procedure: BI-VENTRICULAR IMPLANTABLE CARDIOVERTER DEFIBRILLATOR  (CRT-D);  Surgeon: Evans Lance, MD;  Location: [facility] CATH LAB;  Service: Cardiovascular;  Laterality: N/A;  . BI-VENTRICULAR IMPLANTABLE CARDIOVERTER DEFIBRILLATOR  (CRT-D)  09/24/2013  . CARDIAC PACEMAKER PLACEMENT  08/01/2009   st jude dual chamber; explanted 09/24/2013  . CARDIOVERSION N/A 09/23/2012   Procedure: CARDIOVERSION;  Surgeon: Lelon Perla, MD;  Location: [facility] ENDOSCOPY;  Service: Cardiovascular;  Laterality: N/A;  . CAROTID ENDARTERECTOMY Right 11-25-12  . CATARACT EXTRACTION W/  INTRAOCULAR LENS  IMPLANT, BILATERAL Bilateral   . COLONOSCOPY    . CORONARY ANGIOPLASTY WITH STENT PLACEMENT  08/2012   "1"  . CORONARY ARTERY BYPASS GRAFT  05/17/1993   "CABG X4"  . Defribrillator    . ENDARTERECTOMY Right 11/25/2012   Procedure: ENDARTERECTOMY CAROTID With Dacron Patch Angioplasty;  Surgeon: Elam Dutch, MD;  Location: [place];  Service: Vascular;  Laterality: Right;  . ESOPHAGOGASTRODUODENOSCOPY    . INGUINAL HERNIA REPAIR Bilateral    "one at a time"  . LEFT AND RIGHT HEART CATHETERIZATION WITH CORONARY ANGIOGRAM N/A 09/15/2012   Procedure: LEFT AND RIGHT HEART CATHETERIZATION WITH CORONARY ANGIOGRAM;  Surgeon: Peter M Martinique, MD;  Location: [facility] CATH LAB;  Service: Cardiovascular;  Laterality: N/A;  . PERCUTANEOUS CORONARY STENT INTERVENTION (PCI-S)  09/15/2012   Procedure: PERCUTANEOUS CORONARY STENT INTERVENTION (PCI-S);  Surgeon: Peter M Martinique, MD;  Location: [facility] CATH LAB;  Service: Cardiovascular;;  . RENAL ARTERY STENT    . RIGHT HEART CATHETERIZATION N/A 08/31/2013   Procedure: RIGHT HEART CATH;  Surgeon: Jolaine Artist, MD;  Location: [facility] CATH LAB;  Service: Cardiovascular;  Laterality: N/A;  . TEE WITHOUT CARDIOVERSION N/A 09/23/2012   Procedure: TRANSESOPHAGEAL ECHOCARDIOGRAM (TEE);  Surgeon: Lelon Perla, MD;  Location: [facility] ENDOSCOPY;  Service: Cardiovascular;  Laterality: N/A;  . TRANSLUMINAL ANGIOPLASTY      Family History: Family History  Problem Relation Age of Onset  . CAD Father   . Hypertension Father   . Heart disease Father   . CAD Brother   . Heart disease Brother   . Heart attack Brother   . Diabetes Brother   . Hypertension Mother   . Hypertension Daughter   . Vascular Disease Daughter     Right Carotid  . Colon cancer Son   . Cancer Son     Colon  . Lung cancer Sister   . Heart disease Sister   . Hypertension Sister   . Colon polyps Brother     x2    Social History: Social History   Social History  . Marital status:  Married    Spouse name: N/A  . Number of children: 4  . Years of education: N/A   Occupational History  . Retired    Social History Main Topics  . Smoking status: Former Smoker  Packs/day: 1.00    Years: 50.00    Types: Cigarettes    Quit date: 05/17/1993  . Smokeless tobacco: Current User    Types: Chew  . Alcohol use No  . Drug use: No  . Sexual activity: Not Currently   Other Topics Concern  . None   Social History Narrative  . None    Allergies:  Allergies  Allergen Reactions  . Codeine     "tripped out on it"  . Morphine     "tripped out on it"    Objective:    Vital Signs:   Temp:  [98.1 F (36.7 C)] 98.1 F (36.7 C) (12/01 1129) Pulse Rate:  [68-75] 74 (12/01 1515) Resp:  [15-19] 17 (12/01 1515) BP: (115-130)/(59-69) 120/59 (12/01 1515) SpO2:  [96 %-100 %] 96 % (12/01 1515)    Weight change: There were no vitals filed for this visit.  Intake/Output:   Intake/Output Summary (Last 24 hours) at 02/17/16 1552 Last data filed at 02/17/16 1532  Gross per 24 hour  Intake                0 ml  Output              200 ml  Net             -200 ml     Physical Exam: General:  Elderly. Cachetic. No resp difficulty HEENT: normal Neck: supple. JVP 7-8 . Carotids 2+ bilat; no bruits. No lymphadenopathy or thryomegaly appreciated. Cor: PMI laterally displaced. Regular rate & rhythm. 2/6 TR/MR +s3 Lungs: clear Abdomen: soft, nontender, nondistended. No hepatosplenomegaly. No bruits or masses. Good bowel sounds. Extremities: no cyanosis, clubbing, rash, edema Neuro: alert & orientedx3, cranial nerves grossly intact. moves all 4 extremities w/o difficulty. Affect pleasant   Labs: Basic Metabolic Panel:  Recent Labs Lab 02/16/16 1134 02/17/16 1131  NA 136 136  K 3.1* 3.3*  CL 96* 97*  CO2 30 29  GLUCOSE 94 98  BUN 26* 28*  CREATININE 1.64* 1.71*  CALCIUM 8.8* 8.9    Liver Function Tests: No results for input(s): AST, ALT, ALKPHOS, BILITOT,  PROT, ALBUMIN in the last 168 hours. No results for input(s): LIPASE, AMYLASE in the last 168 hours. No results for input(s): AMMONIA in the last 168 hours.  CBC:  Recent Labs Lab 02/16/16 1134 02/17/16 1131  WBC 6.6 7.0  HGB 13.6 13.6  HCT 41.7 40.8  MCV 96.5 95.8  PLT 131* 134*    Cardiac Enzymes: No results for input(s): CKTOTAL, CKMB, CKMBINDEX, TROPONINI in the last 168 hours.  BNP: BNP (last 3 results)  Recent Labs  02/16/16 1134  BNP 2,316.0*    ProBNP (last 3 results) No results for input(s): PROBNP in the last 8760 hours.   CBG: No results for input(s): GLUCAP in the last 168 hours.  Coagulation Studies: No results for input(s): LABPROT, INR in the last 72 hours.  Other results: EKG: AV paced  Imaging: Dg Chest 2 View  Result Date: 02/17/2016 CLINICAL DATA:  Chest pain EXAM: CHEST  2 VIEW COMPARISON:  10/16/2014 FINDINGS: Cardiac enlargement.  Internal defibrillator unchanged. COPD with hyperinflation. Scarring in the right upper lobe or right middle lobe anteriorly is unchanged. Small bilateral effusions have developed since the prior study. There is mild vascular congestion and prominent interstitial markings. Findings suggest mild heart failure. COPD Mild vascular congestion and small pleural effusions consistent with mild heart failure. Right upper lobe/right middle lobe scarring. IMPRESSION: No  active cardiopulmonary disease. Electronically Signed   By: Franchot Gallo M.D.   On: 02/17/2016 13:21      Medications:     Current Medications: . ALPRAZolam  0.375 mg Oral QHS  . amiodarone  100 mg Oral Daily  . apixaban  2.5 mg Oral BID  . atorvastatin  20 mg Oral q1800  . carvedilol  3.125 mg Oral Daily  . digoxin  0.0625 mg Oral Daily  . doxylamine (Sleep)  25 mg Oral QHS  . furosemide  80 mg Oral Daily  . levothyroxine  112 mcg Oral See admin instructions  . magnesium gluconate  500 mg Oral Daily  . pantoprazole  40 mg Oral Daily  . potassium  chloride SA  20 mEq Oral BID     Infusions: . sodium chloride        Assessment:  1. Chest Pain 2. A/C Systolic Heart Failure, ICM S/P CRT-D  3. PAF S/P DC-CV 09/2012 4. CAD s/p previous RI stent  5. CKD III  Plan/Discussion:    Plan for RHC today   Length of Stay: 0  Amy Clegg 02/17/2016, 3:52 PM  Advanced Heart Failure Team Pager [phone] (M-F; 7a - 4p)  Please contact [facility] for night-coverage after hours (4p -7a ) and weekends on [email or web address]  Patient seen and examined with Darrick Grinder, NP. We discussed all aspects of the encounter. I agree with the assessment and plan as stated above.   Suspect main issue is recurrent low output HF. CP likely due to myocardial wall stress and not ACS. Will plan RHC today with probable need for inotropic support. Given normal troponin and CKD will not proceed with coronary angio at this point. Can reconsider as needed.   Kaitlin Alcindor,MD 4:56 PM

## 2016-02-17 NOTE — Progress Notes (Signed)
Daughter came to clinic to voice concerns of patient who is currently I hospital as a visitor for his wife having heart cath procedure. Daughter states patient has been stressed with wifes procedure today and his upcoming heart cath with Dr. Haroldine Laws on Monday and has had chest pain off and on all morning, requiring 2 SL NTG tabs with temporary relief. ADvised to have patient go to ED now for evaluation and treatment. Daughter states she will do so at this time. Dr. Haroldine Laws and Oda Kilts made aware of plan.  Renee Pain, RN

## 2016-02-17 NOTE — ED Notes (Signed)
Patient transported to X-ray 

## 2016-02-17 NOTE — ED Triage Notes (Signed)
Pt states he started having CP at 8 this morning, pt took 2 nitro, pain now feels tight. Pt states hes having difficulty breathing. Hx of heart failure, stents, stroke, carotid artery surgery, and MI. VSS.

## 2016-02-17 NOTE — Interval H&P Note (Signed)
History and Physical Interval Note:  02/17/2016 4:57 PM  Trevor Simpson  has presented today for surgery, with the diagnosis of chf  The various methods of treatment have been discussed with the patient and family. After consideration of risks, benefits and other options for treatment, the patient has consented to  Procedure(s): Right Heart Cath (N/A) as a surgical intervention .  The patient's history has been reviewed, patient examined, no change in status, stable for surgery.  I have reviewed the patient's chart and labs.  Questions were answered to the patient's satisfaction.     Bensimhon, Quillian Quince

## 2016-02-17 NOTE — H&P (View-Only) (Signed)
Advanced Heart Failure Team Consult Note  Referring Physician: Dr Nehemiah Settle Primary Physician: Dr Maudie Mercury  Primary Cardiologist:  Dr Haroldine Laws  Reason for Consultation: Heart Failure   HPI:    Mr Trevor Simpson is a 82 y.o. year old with history of  COPD, symptomatic bradycardia, PAF, HTN, PAD, CVA, chronic systolic heart failure and ischemic cardiomyopathy (EF 20-25% echo 7/16)  s/p CRT-D on 09/26/13.   [place] 2014  Left mainstem: The left main is long with 20% stenosis distally.  Left anterior descending (LAD): The left anterior descending artery is moderately calcified in the proximal vessel. There is diffuse disease in the proximal vessel up to 30%. The first diagonal is relatively small and has mild disease proximally. The ramus intermediate branch is a large branch that bifurcates in the mid vessel. The proximal vessel has a 90-95% stenosis.  Left circumflex (LCx): The left circumflex is relatively small. It gives rise to a single marginal branch and then continues in the AV groove. The first marginal branch has diffuse 60-70% stenosis proximally.  Right coronary artery (RCA): The right coronary is occluded proximally. There are right to right and left to right collaterals. Severe two-vessel obstructive coronary disease. Chronic total occlusion of the right coronary Successful stenting of the ramus intermediate branch with a bare-metal stent  Admitted June 2015 with low output HF. Started on short term milrinone. Milrinone was discontinued September 2015.  Yesterday he was seen in the HF clinic.Due to increased fatigue and dyspnea he was set up for RHC next week.  Today he presented to the ED with increased dyspnea and chest pain.  Very fatigued. Dyspneic with any activity   Pertinent admission labs include: K 3.3, creatinine 1.7, Hgb 13.6, troponin 0.01.   ECHO 01/19/2016 EF 25%   Review of Systems: [y] = yes, '[ ]'$  = no   General: Weight gain '[ ]'$ ; Weight loss '[ ]'$ ; Anorexia '[ ]'$ ; Fatigue [Y]; Fever  '[ ]'$ ; Chills '[ ]'$ ; Weakness [T ]  Cardiac: Chest pain/pressure '[ ]'$ ; Resting SOB [ Y]; Exertional SOB [Y ]; Orthopnea [Y ]; Pedal Edema [ Y]; Palpitations '[ ]'$ ; Syncope '[ ]'$ ; Presyncope '[ ]'$ ; Paroxysmal nocturnal dyspnea'[ ]'$   Pulmonary: Cough '[ ]'$ ; Wheezing'[ ]'$ ; Hemoptysis'[ ]'$ ; Sputum '[ ]'$ ; Snoring '[ ]'$   GI: Vomiting'[ ]'$ ; Dysphagia'[ ]'$ ; Melena'[ ]'$ ; Hematochezia '[ ]'$ ; Heartburn'[ ]'$ ; Abdominal pain '[ ]'$ ; Constipation '[ ]'$ ; Diarrhea '[ ]'$ ; BRBPR '[ ]'$   GU: Hematuria'[ ]'$ ; Dysuria '[ ]'$ ; Nocturia'[ ]'$   Vascular: Pain in legs with walking '[ ]'$ ; Pain in feet with lying flat '[ ]'$ ; Non-healing sores '[ ]'$ ; Stroke '[ ]'$ ; TIA '[ ]'$ ; Slurred speech '[ ]'$ ;  Neuro: Headaches'[ ]'$ ; Vertigo'[ ]'$ ; Seizures'[ ]'$ ; Paresthesias'[ ]'$ ;Blurred vision '[ ]'$ ; Diplopia '[ ]'$ ; Vision changes '[ ]'$   Ortho/Skin: Arthritis [Y ]; Joint pain [Y ]; Muscle pain '[ ]'$ ; Joint swelling '[ ]'$ ; Back Pain '[ ]'$ ; Rash '[ ]'$   Psych: Depression'[ ]'$ ; Anxiety'[ ]'$   Heme: Bleeding problems '[ ]'$ ; Clotting disorders '[ ]'$ ; Anemia '[ ]'$   Endocrine: Diabetes '[ ]'$ ; Thyroid dysfunction'[ ]'$   Home Medications Prior to Admission medications   Medication Sig Start Date End Date Taking? Authorizing Provider  ALPRAZolam (XANAX) 0.25 MG tablet Take 0.375 mg by mouth at bedtime.   Yes Historical Provider, MD  amiodarone (PACERONE) 200 MG tablet Take 0.5 tablets (100 mg total) by mouth daily. 05/27/15  Yes Jolaine Artist, MD  apixaban (ELIQUIS) 2.5 MG TABS tablet Take 1 tablet (2.5 mg total) by  mouth 2 (two) times daily. 01/19/16  Yes Jolaine Artist, MD  atorvastatin (LIPITOR) 20 MG tablet Take 20 mg by mouth daily at 6 PM.    Yes Historical Provider, MD  B Complex-C (SUPER B COMPLEX) TABS Take 1 tablet by mouth daily.    Yes Historical Provider, MD  calcium carbonate (OS-CAL) 600 MG TABS Take 600 mg by mouth daily with breakfast.    Yes Historical Provider, MD  carvedilol (COREG) 3.125 MG tablet TAKE 1 TABLET BY MOUTH 2 TIMES A DAY WITH A MEAL Patient taking differently: Take 3.125 mg by mouth daily.  01/02/16  Yes  Jolaine Artist, MD  cetirizine (ZYRTEC) 10 MG tablet Take 10 mg by mouth daily.  08/24/10  Yes Historical Provider, MD  digoxin (LANOXIN) 0.125 MG tablet Take 0.5 tablets (0.0625 mg total) by mouth daily. 02/06/16  Yes Larey Dresser, MD  doxylamine, Sleep, (UNISOM) 25 MG tablet Take 25 mg by mouth at bedtime.   Yes Historical Provider, MD  fluticasone (FLONASE) 50 MCG/ACT nasal spray Place 1 spray into both nostrils daily as needed for allergies. Reported on 04/11/2015 09/23/14  Yes Historical Provider, MD  furosemide (LASIX) 80 MG tablet TAKE 1 TABLET BY MOUTH EVERY DAY 10/21/15  Yes Jolaine Artist, MD  levothyroxine (SYNTHROID, LEVOTHROID) 112 MCG tablet Take 112 mcg by mouth See admin instructions. Pt takes Monday- Saturday - skips on Sundays   Yes Historical Provider, MD  magnesium gluconate (MAGONATE) 500 MG tablet Take 500 mg by mouth daily.    Yes Historical Provider, MD  Multiple Vitamin (MULTIVITAMIN WITH MINERALS) TABS Take 1 tablet by mouth daily.   Yes Historical Provider, MD  nitroGLYCERIN (NITROSTAT) 0.4 MG SL tablet Place 1 tablet (0.4 mg total) under the tongue every 5 (five) minutes as needed for chest pain. 09/14/15  Yes Evans Lance, MD  Omega-3 Fatty Acids (FISH OIL) 1200 MG CAPS Take 1,200 mg by mouth daily.    Yes Historical Provider, MD  omeprazole (PRILOSEC) 20 MG capsule Take 20 mg by mouth daily as needed (indigestion).    Yes Historical Provider, MD  potassium chloride SA (K-DUR,KLOR-CON) 20 MEQ tablet Take 1 tablet (20 mEq total) by mouth 2 (two) times daily. 02/16/16  Yes Shirley Friar, PA-C  Saw Palmetto, Serenoa repens, (SAW PALMETTO PO) Take 1 capsule by mouth 2 (two) times daily.   Yes Historical Provider, MD  vitamin C (ASCORBIC ACID) 500 MG tablet Take 500 mg by mouth daily.     Yes Historical Provider, MD    Past Medical History: Past Medical History:  Diagnosis Date  . AAA (abdominal aortic aneurysm) ([place])    repaired  . Anxiety   . Arthritis      "left elbow and shoulder" (09/24/2013)  . Atrial fibrillation ([place])   . Automatic implantable cardioverter-defibrillator in situ   . Carotid artery occlusion   . Colon polyp   . Congestive heart failure ([place])   . Coronary artery disease    a. LHC (08/2012): Lmain: long, 20% stenosis distally LAD: mod calcified and mild dz prox. diff dz proximally 30%, first diagonal mild dz prox and small, Ramus intermediate brach lg bifurcates mid vessel prox. 90-95% stenosis LCx: relatively sml, rise to single marginal and contiine AV groove, 1st marg. diff 60-70% dz stenosis prox RCA: occ. prox. R-L coll, successful stent remus interm branch BMS  . GERD (gastroesophageal reflux disease)   . H/O blood clots   . HCAP (healthcare-associated pneumonia) 2014  .  Hyperlipidemia   . Hypertension   . Hypothyroidism   . Ischemic cardiomyopathy   . Myocardial infarction   . Other and unspecified hyperlipidemia   . Pneumonia   . PVD (peripheral vascular disease) ([place])    s/p renal artery stent 20-04  . Sinoatrial node dysfunction ([facility])   . Stroke [facility]) 2014   left sided affected no residual effects  . Systolic congestive heart failure ([place])    a. ICM b.     Past Surgical History: Past Surgical History:  Procedure Laterality Date  . ABDOMINAL AORTIC ANEURYSM REPAIR  05/17/1993  . BI-VENTRICULAR IMPLANTABLE CARDIOVERTER DEFIBRILLATOR N/A 09/24/2013   Procedure: BI-VENTRICULAR IMPLANTABLE CARDIOVERTER DEFIBRILLATOR  (CRT-D);  Surgeon: Evans Lance, MD;  Location: [facility] CATH LAB;  Service: Cardiovascular;  Laterality: N/A;  . BI-VENTRICULAR IMPLANTABLE CARDIOVERTER DEFIBRILLATOR  (CRT-D)  09/24/2013  . CARDIAC PACEMAKER PLACEMENT  08/01/2009   st jude dual chamber; explanted 09/24/2013  . CARDIOVERSION N/A 09/23/2012   Procedure: CARDIOVERSION;  Surgeon: Lelon Perla, MD;  Location: [facility] ENDOSCOPY;  Service: Cardiovascular;  Laterality: N/A;  . CAROTID ENDARTERECTOMY Right 11-25-12  . CATARACT EXTRACTION W/  INTRAOCULAR LENS  IMPLANT, BILATERAL Bilateral   . COLONOSCOPY    . CORONARY ANGIOPLASTY WITH STENT PLACEMENT  08/2012   "1"  . CORONARY ARTERY BYPASS GRAFT  05/17/1993   "CABG X4"  . Defribrillator    . ENDARTERECTOMY Right 11/25/2012   Procedure: ENDARTERECTOMY CAROTID With Dacron Patch Angioplasty;  Surgeon: Elam Dutch, MD;  Location: [facility];  Service: Vascular;  Laterality: Right;  . ESOPHAGOGASTRODUODENOSCOPY    . INGUINAL HERNIA REPAIR Bilateral    "one at a time"  . LEFT AND RIGHT HEART CATHETERIZATION WITH CORONARY ANGIOGRAM N/A 09/15/2012   Procedure: LEFT AND RIGHT HEART CATHETERIZATION WITH CORONARY ANGIOGRAM;  Surgeon: Peter M Martinique, MD;  Location: [facility] CATH LAB;  Service: Cardiovascular;  Laterality: N/A;  . PERCUTANEOUS CORONARY STENT INTERVENTION (PCI-S)  09/15/2012   Procedure: PERCUTANEOUS CORONARY STENT INTERVENTION (PCI-S);  Surgeon: Peter M Martinique, MD;  Location: [facility] CATH LAB;  Service: Cardiovascular;;  . RENAL ARTERY STENT    . RIGHT HEART CATHETERIZATION N/A 08/31/2013   Procedure: RIGHT HEART CATH;  Surgeon: Jolaine Artist, MD;  Location: [facility] CATH LAB;  Service: Cardiovascular;  Laterality: N/A;  . TEE WITHOUT CARDIOVERSION N/A 09/23/2012   Procedure: TRANSESOPHAGEAL ECHOCARDIOGRAM (TEE);  Surgeon: Lelon Perla, MD;  Location: [facility] ENDOSCOPY;  Service: Cardiovascular;  Laterality: N/A;  . TRANSLUMINAL ANGIOPLASTY      Family History: Family History  Problem Relation Age of Onset  . CAD Father   . Hypertension Father   . Heart disease Father   . CAD Brother   . Heart disease Brother   . Heart attack Brother   . Diabetes Brother   . Hypertension Mother   . Hypertension Daughter   . Vascular Disease Daughter     Right Carotid  . Colon cancer Son   . Cancer Son     Colon  . Lung cancer Sister   . Heart disease Sister   . Hypertension Sister   . Colon polyps Brother     x2    Social History: Social History   Social History  . Marital status:  Married    Spouse name: N/A  . Number of children: 4  . Years of education: N/A   Occupational History  . Retired    Social History Main Topics  . Smoking status: Former Smoker  Packs/day: 1.00    Years: 50.00    Types: Cigarettes    Quit date: 05/17/1993  . Smokeless tobacco: Current User    Types: Chew  . Alcohol use No  . Drug use: No  . Sexual activity: Not Currently   Other Topics Concern  . None   Social History Narrative  . None    Allergies:  Allergies  Allergen Reactions  . Codeine     "tripped out on it"  . Morphine     "tripped out on it"    Objective:    Vital Signs:   Temp:  [98.1 F (36.7 C)] 98.1 F (36.7 C) (12/01 1129) Pulse Rate:  [68-75] 74 (12/01 1515) Resp:  [15-19] 17 (12/01 1515) BP: (115-130)/(59-69) 120/59 (12/01 1515) SpO2:  [96 %-100 %] 96 % (12/01 1515)    Weight change: There were no vitals filed for this visit.  Intake/Output:   Intake/Output Summary (Last 24 hours) at 02/17/16 1552 Last data filed at 02/17/16 1532  Gross per 24 hour  Intake                0 ml  Output              200 ml  Net             -200 ml     Physical Exam: General:  Elderly. Cachetic. No resp difficulty HEENT: normal Neck: supple. JVP 7-8 . Carotids 2+ bilat; no bruits. No lymphadenopathy or thryomegaly appreciated. Cor: PMI laterally displaced. Regular rate & rhythm. 2/6 TR/MR +s3 Lungs: clear Abdomen: soft, nontender, nondistended. No hepatosplenomegaly. No bruits or masses. Good bowel sounds. Extremities: no cyanosis, clubbing, rash, edema Neuro: alert & orientedx3, cranial nerves grossly intact. moves all 4 extremities w/o difficulty. Affect pleasant   Labs: Basic Metabolic Panel:  Recent Labs Lab 02/16/16 1134 02/17/16 1131  NA 136 136  K 3.1* 3.3*  CL 96* 97*  CO2 30 29  GLUCOSE 94 98  BUN 26* 28*  CREATININE 1.64* 1.71*  CALCIUM 8.8* 8.9    Liver Function Tests: No results for input(s): AST, ALT, ALKPHOS, BILITOT,  PROT, ALBUMIN in the last 168 hours. No results for input(s): LIPASE, AMYLASE in the last 168 hours. No results for input(s): AMMONIA in the last 168 hours.  CBC:  Recent Labs Lab 02/16/16 1134 02/17/16 1131  WBC 6.6 7.0  HGB 13.6 13.6  HCT 41.7 40.8  MCV 96.5 95.8  PLT 131* 134*    Cardiac Enzymes: No results for input(s): CKTOTAL, CKMB, CKMBINDEX, TROPONINI in the last 168 hours.  BNP: BNP (last 3 results)  Recent Labs  02/16/16 1134  BNP 2,316.0*    ProBNP (last 3 results) No results for input(s): PROBNP in the last 8760 hours.   CBG: No results for input(s): GLUCAP in the last 168 hours.  Coagulation Studies: No results for input(s): LABPROT, INR in the last 72 hours.  Other results: EKG: AV paced  Imaging: Dg Chest 2 View  Result Date: 02/17/2016 CLINICAL DATA:  Chest pain EXAM: CHEST  2 VIEW COMPARISON:  10/16/2014 FINDINGS: Cardiac enlargement.  Internal defibrillator unchanged. COPD with hyperinflation. Scarring in the right upper lobe or right middle lobe anteriorly is unchanged. Small bilateral effusions have developed since the prior study. There is mild vascular congestion and prominent interstitial markings. Findings suggest mild heart failure. COPD Mild vascular congestion and small pleural effusions consistent with mild heart failure. Right upper lobe/right middle lobe scarring. IMPRESSION: No  active cardiopulmonary disease. Electronically Signed   By: Franchot Gallo M.D.   On: 02/17/2016 13:21      Medications:     Current Medications: . ALPRAZolam  0.375 mg Oral QHS  . amiodarone  100 mg Oral Daily  . apixaban  2.5 mg Oral BID  . atorvastatin  20 mg Oral q1800  . carvedilol  3.125 mg Oral Daily  . digoxin  0.0625 mg Oral Daily  . doxylamine (Sleep)  25 mg Oral QHS  . furosemide  80 mg Oral Daily  . levothyroxine  112 mcg Oral See admin instructions  . magnesium gluconate  500 mg Oral Daily  . pantoprazole  40 mg Oral Daily  . potassium  chloride SA  20 mEq Oral BID     Infusions: . sodium chloride        Assessment:  1. Chest Pain 2. A/C Systolic Heart Failure, ICM S/P CRT-D  3. PAF S/P DC-CV 09/2012 4. CAD s/p previous RI stent  5. CKD III  Plan/Discussion:    Plan for RHC today   Length of Stay: 0  Amy Clegg 02/17/2016, 3:52 PM  Advanced Heart Failure Team Pager [phone] (M-F; 7a - 4p)  Please contact [facility] for night-coverage after hours (4p -7a ) and weekends on [email or web address]  Patient seen and examined with Darrick Grinder, NP. We discussed all aspects of the encounter. I agree with the assessment and plan as stated above.   Suspect main issue is recurrent low output HF. CP likely due to myocardial wall stress and not ACS. Will plan RHC today with probable need for inotropic support. Given normal troponin and CKD will not proceed with coronary angio at this point. Can reconsider as needed.   Motty Borin,MD 4:56 PM

## 2016-02-17 NOTE — H&P (Signed)
History and Physical    Trevor Simpson UXL:[id number] DOB: 1933/06/06 DOA: 02/17/2016   PCP: [name], MD   Patient coming from:  Home    Chief Complaint: Chest Pain   HPI: Trevor Simpson is a 82 y.o. male with extensive cardiac medical history including atrial fibrillation, history of pacemaker placement, history of PE, on Eliquis, as well as a history of CVA, history of systolic heart failure and ICM, with EF 20 to 25%, and a history of COPD, presenting with acute onset of central, substernal chest pain, starting this morning, as he is wife was preparing for our cardiac catheterization. He reports that he has been experiencing waxing and waning symptoms over the last few weeks, and has been seen by his cardiologist regarding this issue. In fact, he is scheduled to undergo a right heart catheterization and 02/20/2016. He denies any radiation to the jaw or left arm. The pain is now resolve after a 2nd nitroglycerin in an aspirin. The pain is not worsened with deep inspiration, movement or exertion. He has been increasingly fatigued over the last few weeks. He reports having good and bad days regarding his fatigue and shortness of breath. He denies any dizziness or falls. No presyncope or syncope. He denies any cough, fever chills night sweats, or sick contacts. He denies any vomiting or abdominal pain. Appetite is normal. He denies any leg swelling or calf pain. He denies any headaches or vision changes. No seizures or confusion are reported.No recent long distance trips. He has admitted to significant stress regarding his wife cardiac condition. No new meds. Not on hormonal therapy.  No new herbal supplements. Does not smoke or intake significant amount of ETOH. No recreational drugs. His last cardiac catheterization was in June 2014 and is followed by Dr. Tempie Hoist. During his visit on 02/16/2016, his cardiologist also recommended that the patient reinforces fluid restriction to less than 2 L a day,  with sodium restriction as well to less than 2 g, and he admits to have been abiding to this.  ED Course:  BP 127/69   Pulse 68   Temp 98.1 F (36.7 C) (Oral)   Resp 18   SpO2 98%      Review of Systems: As per HPI otherwise 10 point review of systems negative.   Past Medical History:  Diagnosis Date  . AAA (abdominal aortic aneurysm) ([place])    repaired  . Anxiety   . Arthritis    "left elbow and shoulder" (09/24/2013)  . Atrial fibrillation ([place])   . Automatic implantable cardioverter-defibrillator in situ   . Carotid artery occlusion   . Colon polyp   . Congestive heart failure ([place])   . Coronary artery disease    a. LHC (08/2012): Lmain: long, 20% stenosis distally LAD: mod calcified and mild dz prox. diff dz proximally 30%, first diagonal mild dz prox and small, Ramus intermediate brach lg bifurcates mid vessel prox. 90-95% stenosis LCx: relatively sml, rise to single marginal and contiine AV groove, 1st marg. diff 60-70% dz stenosis prox RCA: occ. prox. R-L coll, successful stent remus interm branch BMS  . GERD (gastroesophageal reflux disease)   . H/O blood clots   . HCAP (healthcare-associated pneumonia) 2014  . Hyperlipidemia   . Hypertension   . Hypothyroidism   . Ischemic cardiomyopathy   . Myocardial infarction   . Other and unspecified hyperlipidemia   . Pneumonia   . PVD (peripheral vascular disease) (H[facility])    s/p renal artery stent  20-04  . Sinoatrial node dysfunction ([facility])   . Stroke [facility]) 2014   left sided affected no residual effects  . Systolic congestive heart failure ([place])    a. ICM b.     Past Surgical History:  Procedure Laterality Date  . ABDOMINAL AORTIC ANEURYSM REPAIR  05/17/1993  . BI-VENTRICULAR IMPLANTABLE CARDIOVERTER DEFIBRILLATOR N/A 09/24/2013   Procedure: BI-VENTRICULAR IMPLANTABLE CARDIOVERTER DEFIBRILLATOR  (CRT-D);  Surgeon: Evans Lance, MD;  Location: [facility] CATH LAB;  Service: Cardiovascular;  Laterality: N/A;  . BI-VENTRICULAR  IMPLANTABLE CARDIOVERTER DEFIBRILLATOR  (CRT-D)  09/24/2013  . CARDIAC PACEMAKER PLACEMENT  08/01/2009   st jude dual chamber; explanted 09/24/2013  . CARDIOVERSION N/A 09/23/2012   Procedure: CARDIOVERSION;  Surgeon: Lelon Perla, MD;  Location: [facility] ENDOSCOPY;  Service: Cardiovascular;  Laterality: N/A;  . CAROTID ENDARTERECTOMY Right 11-25-12  . CATARACT EXTRACTION W/ INTRAOCULAR LENS  IMPLANT, BILATERAL Bilateral   . COLONOSCOPY    . CORONARY ANGIOPLASTY WITH STENT PLACEMENT  08/2012   "1"  . CORONARY ARTERY BYPASS GRAFT  05/17/1993   "CABG X4"  . Defribrillator    . ENDARTERECTOMY Right 11/25/2012   Procedure: ENDARTERECTOMY CAROTID With Dacron Patch Angioplasty;  Surgeon: Elam Dutch, MD;  Location: [place];  Service: Vascular;  Laterality: Right;  . ESOPHAGOGASTRODUODENOSCOPY    . INGUINAL HERNIA REPAIR Bilateral    "one at a time"  . LEFT AND RIGHT HEART CATHETERIZATION WITH CORONARY ANGIOGRAM N/A 09/15/2012   Procedure: LEFT AND RIGHT HEART CATHETERIZATION WITH CORONARY ANGIOGRAM;  Surgeon: Peter M Martinique, MD;  Location: [facility] CATH LAB;  Service: Cardiovascular;  Laterality: N/A;  . PERCUTANEOUS CORONARY STENT INTERVENTION (PCI-S)  09/15/2012   Procedure: PERCUTANEOUS CORONARY STENT INTERVENTION (PCI-S);  Surgeon: Peter M Martinique, MD;  Location: [facility] CATH LAB;  Service: Cardiovascular;;  . RENAL ARTERY STENT    . RIGHT HEART CATHETERIZATION N/A 08/31/2013   Procedure: RIGHT HEART CATH;  Surgeon: Jolaine Artist, MD;  Location: [facility] CATH LAB;  Service: Cardiovascular;  Laterality: N/A;  . TEE WITHOUT CARDIOVERSION N/A 09/23/2012   Procedure: TRANSESOPHAGEAL ECHOCARDIOGRAM (TEE);  Surgeon: Lelon Perla, MD;  Location: [facility] ENDOSCOPY;  Service: Cardiovascular;  Laterality: N/A;  . TRANSLUMINAL ANGIOPLASTY      Social History Social History   Social History  . Marital status: Married    Spouse name: N/A  . Number of children: 4  . Years of education: N/A   Occupational History  . Retired     Social History Main Topics  . Smoking status: Former Smoker    Packs/day: 1.00    Years: 50.00    Types: Cigarettes    Quit date: 05/17/1993  . Smokeless tobacco: Current User    Types: Chew  . Alcohol use No  . Drug use: No  . Sexual activity: Not Currently   Other Topics Concern  . Not on file   Social History Narrative  . No narrative on file     Allergies  Allergen Reactions  . Codeine     "tripped out on it"  . Morphine     "tripped out on it"    Family History  Problem Relation Age of Onset  . CAD Father   . Hypertension Father   . Heart disease Father   . CAD Brother   . Heart disease Brother   . Heart attack Brother   . Diabetes Brother   . Hypertension Mother   . Hypertension Daughter   . Vascular Disease Daughter  Right Carotid  . Colon cancer Son   . Cancer Son     Colon  . Lung cancer Sister   . Heart disease Sister   . Hypertension Sister   . Colon polyps Brother     x2      Prior to Admission medications   Medication Sig Start Date End Date Taking? Authorizing Provider  ALPRAZolam (XANAX) 0.25 MG tablet Take 0.375 mg by mouth at bedtime.    Historical Provider, MD  amiodarone (PACERONE) 200 MG tablet Take 0.5 tablets (100 mg total) by mouth daily. 05/27/15   Jolaine Artist, MD  apixaban (ELIQUIS) 2.5 MG TABS tablet Take 1 tablet (2.5 mg total) by mouth 2 (two) times daily. 01/19/16   Jolaine Artist, MD  atorvastatin (LIPITOR) 20 MG tablet Take 20 mg by mouth daily at 6 PM.     Historical Provider, MD  B Complex-C (SUPER B COMPLEX) TABS Take 1 tablet by mouth daily.     Historical Provider, MD  calcium carbonate (OS-CAL) 600 MG TABS Take 600 mg by mouth daily with breakfast.     Historical Provider, MD  carvedilol (COREG) 3.125 MG tablet TAKE 1 TABLET BY MOUTH 2 TIMES A DAY WITH A MEAL 01/02/16   Jolaine Artist, MD  cetirizine (ZYRTEC) 10 MG tablet Take 10 mg by mouth daily.  08/24/10   Historical Provider, MD  digoxin (LANOXIN)  0.125 MG tablet Take 0.5 tablets (0.0625 mg total) by mouth daily. 02/06/16   Larey Dresser, MD  fluticasone (FLONASE) 50 MCG/ACT nasal spray Place 1 spray into both nostrils daily. Reported on 04/11/2015 09/23/14   Historical Provider, MD  furosemide (LASIX) 80 MG tablet TAKE 1 TABLET BY MOUTH EVERY DAY 10/21/15   Jolaine Artist, MD  levothyroxine (SYNTHROID, LEVOTHROID) 112 MCG tablet Take 112 mcg by mouth daily.    Historical Provider, MD  magnesium gluconate (MAGONATE) 500 MG tablet Take 500 mg by mouth daily.     Historical Provider, MD  Multiple Vitamin (MULTIVITAMIN WITH MINERALS) TABS Take 1 tablet by mouth daily.    Historical Provider, MD  nitroGLYCERIN (NITROSTAT) 0.4 MG SL tablet Place 1 tablet (0.4 mg total) under the tongue every 5 (five) minutes as needed for chest pain. 09/14/15   Evans Lance, MD  Omega-3 Fatty Acids (FISH OIL) 1200 MG CAPS Take 1,200 mg by mouth daily.     Historical Provider, MD  omeprazole (PRILOSEC) 20 MG capsule Take 20 mg by mouth daily as needed (indigestion).     Historical Provider, MD  potassium chloride SA (K-DUR,KLOR-CON) 20 MEQ tablet Take 1 tablet (20 mEq total) by mouth 2 (two) times daily. 02/16/16   Shirley Friar, PA-C  Saw Palmetto, Serenoa repens, (SAW PALMETTO PO) Take 1 capsule by mouth 2 (two) times daily.    Historical Provider, MD  vitamin C (ASCORBIC ACID) 500 MG tablet Take 500 mg by mouth daily.      Historical Provider, MD    Physical Exam:    Vitals:   02/17/16 1129 02/17/16 1200 02/17/16 1245 02/17/16 1330  BP: 115/63 120/65 118/64 127/69  Pulse: 70 70 73 68  Resp: '18 15 18 18  '$ Temp: 98.1 F (36.7 C)     TempSrc: Oral     SpO2: 100% 99% 97% 98%       Constitutional: NAD, calm, comfortable, Chronically ill appearing, temporal wasting noted. Very thin Vitals:   02/17/16 1129 02/17/16 1200 02/17/16 1245 02/17/16 1330  BP:  115/63 120/65 118/64 127/69  Pulse: 70 70 73 68  Resp: '18 15 18 18  '$ Temp: 98.1 F (36.7  C)     TempSrc: Oral     SpO2: 100% 99% 97% 98%   Eyes: PERRL, lids and conjunctivae normal ENMT: Mucous membranes are moist. Posterior pharynx clear of any exudate or lesions.Almost edentulous  Neck: normal, supple, no masses, no thyromegaly Respiratory: clear to auscultation bilaterally, no wheezing, no crackles. Normal respiratory effort. No accessory muscle use.  Cardiovascular: it regularly regular rate and rhythm, 2 out of 6 murmurs / rubs / gallops. No extremity edema. 2+ pedal pulses. No carotid bruits.  Abdomen: no tenderness, no masses palpated. No hepatosplenomegaly. Bowel sounds positive.  Musculoskeletal: no clubbing / cyanosis. No joint deformity upper and lower extremities. Good ROM, no contractures. Normal muscle tone.  Skin: no rashes, lesions, ulcers.  Neurologic: CN 2-12 grossly intact. Sensation intact, DTR normal. Strength 5/5 in all 4.  Psychiatric: Normal judgment and insight. Alert and oriented x 3. Normal mood.     Labs on Admission: I have personally reviewed following labs and imaging studies  CBC:  Recent Labs Lab 02/16/16 1134 02/17/16 1131  WBC 6.6 7.0  HGB 13.6 13.6  HCT 41.7 40.8  MCV 96.5 95.8  PLT 131* 134*    Basic Metabolic Panel:  Recent Labs Lab 02/16/16 1134 02/17/16 1131  NA 136 136  K 3.1* 3.3*  CL 96* 97*  CO2 30 29  GLUCOSE 94 98  BUN 26* 28*  CREATININE 1.64* 1.71*  CALCIUM 8.8* 8.9    GFR: Estimated Creatinine Clearance: 34.9 mL/min (by C-G formula based on SCr of 1.71 mg/dL (H)).  Liver Function Tests: No results for input(s): AST, ALT, ALKPHOS, BILITOT, PROT, ALBUMIN in the last 168 hours. No results for input(s): LIPASE, AMYLASE in the last 168 hours. No results for input(s): AMMONIA in the last 168 hours.  Coagulation Profile: No results for input(s): INR, PROTIME in the last 168 hours.  Cardiac Enzymes: No results for input(s): CKTOTAL, CKMB, CKMBINDEX, TROPONINI in the last 168 hours.  BNP (last 3  results) No results for input(s): PROBNP in the last 8760 hours.  HbA1C: No results for input(s): HGBA1C in the last 72 hours.  CBG: No results for input(s): GLUCAP in the last 168 hours.  Lipid Profile: No results for input(s): CHOL, HDL, LDLCALC, TRIG, CHOLHDL, LDLDIRECT in the last 72 hours.  Thyroid Function Tests: No results for input(s): TSH, T4TOTAL, FREET4, T3FREE, THYROIDAB in the last 72 hours.  Anemia Panel: No results for input(s): VITAMINB12, FOLATE, FERRITIN, TIBC, IRON, RETICCTPCT in the last 72 hours.  Urine analysis:    Component Value Date/Time   COLORURINE YELLOW 11/20/2012 1123   APPEARANCEUR CLEAR 11/20/2012 1123   LABSPEC 1.004 (L) 11/20/2012 1123   PHURINE 7.0 11/20/2012 1123   GLUCOSEU NEGATIVE 11/20/2012 1123   HGBUR NEGATIVE 11/20/2012 1123   BILIRUBINUR NEGATIVE 11/20/2012 1123   KETONESUR NEGATIVE 11/20/2012 1123   PROTEINUR NEGATIVE 11/20/2012 1123   UROBILINOGEN 0.2 11/20/2012 1123   NITRITE NEGATIVE 11/20/2012 1123   LEUKOCYTESUR NEGATIVE 11/20/2012 1123    Sepsis Labs: '@LABRCNTIP'$ (procalcitonin:4,lacticidven:4) )No results found for this or any previous visit (from the past 240 hour(s)).   Radiological Exams on Admission: Dg Chest 2 View  Result Date: 02/17/2016 CLINICAL DATA:  Chest pain EXAM: CHEST  2 VIEW COMPARISON:  10/16/2014 FINDINGS: Cardiac enlargement.  Internal defibrillator unchanged. COPD with hyperinflation. Scarring in the right upper lobe or right middle lobe  anteriorly is unchanged. Small bilateral effusions have developed since the prior study. There is mild vascular congestion and prominent interstitial markings. Findings suggest mild heart failure. COPD Mild vascular congestion and small pleural effusions consistent with mild heart failure. Right upper lobe/right middle lobe scarring. IMPRESSION: No active cardiopulmonary disease. Electronically Signed   By: Franchot Gallo M.D.   On: 02/17/2016 13:21    EKG: Independently  reviewed.  Assessment/Plan Active Problems:   Chest pain   Essential hypertension   Coronary atherosclerosis   ATRIAL FIBRILLATION, PAROXYSMAL   Biventricular ICD (implantable cardioverter-defibrillator) in place   Cardiomyopathy [facility])   Anxiety   Cerebral infarction ([place])   Pulmonary emphysema (HCC)   Chronic systolic CHF (congestive heart failure) (HCC)   PMT (pacemaker-mediated tachycardia)   Chronic anticoagulation   Chronic kidney disease (CKD), stage III (moderate)  Chest pain syndrome, cardiac versus musculoskeletal vs anxiety HEART score 4-5 . Troponin neg to date at 0.01 , EKG without evidence of ACS. CP relieved by  Second nitroglycerin without recurrence CXR unrevealing.  Admit to Telemetry/ Observation Chest pain order set Cycle troponins EKG in am continue ASA, O2 and NTG as needed Statins  GI cocktail Check Lipid panel  Hb A1C   Acute on Chronic Systolic CHF / ICM  2 D echo 01/2016  Moderately dilated LV with diffuse hypokinesis. EF 25%.  Restrictive diastolic function with evidence for elevated LV   filling pressure.  weight 71.7 kg . LAst BNP on 11/30 2316 -continue home meds  -monitor I/Os -daily weights -prn 02 TO be followed by Dr. Missy Sabins as OP   Hypertension BP 127/69   Controlled Continue home meds   Hyperlipidemia Continue home statins  Chronic kidney disease stage 3  baseline creatinine 1.6    Current Cr 1.71 similar to BL  Lab Results  Component Value Date   CREATININE 1.71 (H) 02/17/2016   CREATININE 1.64 (H) 02/16/2016   CREATININE 1.72 (H) 01/19/2016  Repeat CMET in am  Hypokalemia K 3.3  . Did not take his K this morning  Continue oral replenishment  Repeat CMET in am   Atrial Fibrillation, s/p AICD  CHA2DS2-VASc score 7 , on anticoagulation with Eliquis. H/o PMP for tachy brady syndrome  Rate controlled -Continue meds   Anxiety Continue home   COPD without exacerbation/ emphysema Osats normal at RA  CXR NAD,  WBC  normal   No intervention is indicated at this time, continue to monitor   GERD, no acute symptoms: Continue PPI    DVT prophylaxis: Eliquis  Code Status:   Full    Family Communication:  Discussed with patient Disposition Plan: Expect patient to be discharged to home after condition improves Consults called:    None Admission status: Tele Obs  Arney Mayabb E, PA-C [facility]   02/17/2016, 2:50 PM

## 2016-02-17 NOTE — ED Provider Notes (Signed)
[facility] DEPT Provider Note   CSN: [id number] Arrival date & time: 02/17/16  1121     History   Chief Complaint Chief Complaint  Patient presents with  . Chest Pain    HPI Trevor Simpson is a 82 y.o. male.  HPI 82 year old male who presents with chest pain. He has a history of coronary artery disease, CHF, atrial fibrillation and PE/DVT on eliquis, abdominal aortic aneurysm, and a prior CVA. States pain in his usual state of health. Was in the hospital visiting his wife today when he had sudden onset of squeezing central chest pain that was nonradiating. It occurred at rest at around 8 AM to 9 AM. States that symptoms lasted about 15 minutes, resolved after taking a second nitroglycerin. He did not have associated shortness of breath, nausea or vomiting, diaphoresis, syncope or near syncope. States that he typically does not have chest pain and states that pain is not as severe as when he has had his MI in the past. No lower extremity edema, calf tenderness, abdominal pain, back pain, cough, fevers or chills.  Past Medical History:  Diagnosis Date  . AAA (abdominal aortic aneurysm) ([place])    repaired  . Anxiety   . Arthritis    "left elbow and shoulder" (09/24/2013)  . Atrial fibrillation ([place])   . Automatic implantable cardioverter-defibrillator in situ   . Carotid artery occlusion   . Colon polyp   . Congestive heart failure ([place])   . Coronary artery disease    a. LHC (08/2012): Lmain: long, 20% stenosis distally LAD: mod calcified and mild dz prox. diff dz proximally 30%, first diagonal mild dz prox and small, Ramus intermediate brach lg bifurcates mid vessel prox. 90-95% stenosis LCx: relatively sml, rise to single marginal and contiine AV groove, 1st marg. diff 60-70% dz stenosis prox RCA: occ. prox. R-L coll, successful stent remus interm branch BMS  . GERD (gastroesophageal reflux disease)   . H/O blood clots   . HCAP (healthcare-associated pneumonia) 2014  .  Hyperlipidemia   . Hypertension   . Hypothyroidism   . Ischemic cardiomyopathy   . Myocardial infarction   . Other and unspecified hyperlipidemia   . Pneumonia   . PVD (peripheral vascular disease) ([place])    s/p renal artery stent 20-04  . Sinoatrial node dysfunction (HCC)   . Stroke [facility]) 2014   left sided affected no residual effects  . Systolic congestive heart failure ([place])    a. ICM b.     Patient Active Problem List   Diagnosis Date Noted  . Chest pain 02/17/2016  . Arterial hypotension 10/11/2015  . NSTEMI (non-ST elevated myocardial infarction) ([place]) 10/17/2014  . Chronic kidney disease (CKD), stage III (moderate) 10/17/2014  . Epigastric pain 10/16/2014  . Post-nasal drainage 09/23/2014  . History of colonic polyps 03/03/2014  . Chronic anticoagulation 03/03/2014  . Family history of colon cancer 03/03/2014  . CN (constipation) 03/03/2014  . Lung nodule 02/16/2014  . Dyspnea 02/16/2014  . PMT (pacemaker-mediated tachycardia) 10/20/2013  . Chronic systolic CHF (congestive heart failure) ([place]) 09/08/2013  . Pulmonary emphysema ([facility]) 02/26/2013  . Cerebral infarction ([place]) 09/27/2012  . Anxiety 09/20/2012  . Shortness of breath 09/20/2012  . Intermediate coronary syndrome ([place]) 09/18/2012  . Cardiomyopathy, ischemic 09/14/2012  . Systolic congestive heart failure ([place])   . Cardiomyopathy ([place])   . CAROTID ARTERY DISEASE 12/16/2009  . Biventricular ICD (implantable cardioverter-defibrillator) in place 11/22/2009  . Other and unspecified hyperlipidemia 06/30/2009  . Essential  hypertension 06/30/2009  . Coronary atherosclerosis 06/30/2009  . ATRIAL FIBRILLATION, PAROXYSMAL 06/30/2009  . BRADYCARDIA-TACHYCARDIA SYNDROME 06/30/2009    Past Surgical History:  Procedure Laterality Date  . ABDOMINAL AORTIC ANEURYSM REPAIR  05/17/1993  . BI-VENTRICULAR IMPLANTABLE CARDIOVERTER DEFIBRILLATOR N/A 09/24/2013   Procedure: BI-VENTRICULAR IMPLANTABLE CARDIOVERTER DEFIBRILLATOR   (CRT-D);  Surgeon: Evans Lance, MD;  Location: [facility] CATH LAB;  Service: Cardiovascular;  Laterality: N/A;  . BI-VENTRICULAR IMPLANTABLE CARDIOVERTER DEFIBRILLATOR  (CRT-D)  09/24/2013  . CARDIAC PACEMAKER PLACEMENT  08/01/2009   st jude dual chamber; explanted 09/24/2013  . CARDIOVERSION N/A 09/23/2012   Procedure: CARDIOVERSION;  Surgeon: Lelon Perla, MD;  Location: [facility] ENDOSCOPY;  Service: Cardiovascular;  Laterality: N/A;  . CAROTID ENDARTERECTOMY Right 11-25-12  . CATARACT EXTRACTION W/ INTRAOCULAR LENS  IMPLANT, BILATERAL Bilateral   . COLONOSCOPY    . CORONARY ANGIOPLASTY WITH STENT PLACEMENT  08/2012   "1"  . CORONARY ARTERY BYPASS GRAFT  05/17/1993   "CABG X4"  . Defribrillator    . ENDARTERECTOMY Right 11/25/2012   Procedure: ENDARTERECTOMY CAROTID With Dacron Patch Angioplasty;  Surgeon: Elam Dutch, MD;  Location: [facility];  Service: Vascular;  Laterality: Right;  . ESOPHAGOGASTRODUODENOSCOPY    . INGUINAL HERNIA REPAIR Bilateral    "one at a time"  . LEFT AND RIGHT HEART CATHETERIZATION WITH CORONARY ANGIOGRAM N/A 09/15/2012   Procedure: LEFT AND RIGHT HEART CATHETERIZATION WITH CORONARY ANGIOGRAM;  Surgeon: Peter M Martinique, MD;  Location: [facility] CATH LAB;  Service: Cardiovascular;  Laterality: N/A;  . PERCUTANEOUS CORONARY STENT INTERVENTION (PCI-S)  09/15/2012   Procedure: PERCUTANEOUS CORONARY STENT INTERVENTION (PCI-S);  Surgeon: Peter M Martinique, MD;  Location: [facility] CATH LAB;  Service: Cardiovascular;;  . RENAL ARTERY STENT    . RIGHT HEART CATHETERIZATION N/A 08/31/2013   Procedure: RIGHT HEART CATH;  Surgeon: Jolaine Artist, MD;  Location: [facility] CATH LAB;  Service: Cardiovascular;  Laterality: N/A;  . TEE WITHOUT CARDIOVERSION N/A 09/23/2012   Procedure: TRANSESOPHAGEAL ECHOCARDIOGRAM (TEE);  Surgeon: Lelon Perla, MD;  Location: [facility] ENDOSCOPY;  Service: Cardiovascular;  Laterality: N/A;  . [place] Medications    Prior to Admission medications     Medication Sig Start Date End Date Taking? Authorizing Provider  ALPRAZolam (XANAX) 0.25 MG tablet Take 0.375 mg by mouth at bedtime.   Yes Historical Provider, MD  amiodarone (PACERONE) 200 MG tablet Take 0.5 tablets (100 mg total) by mouth daily. 05/27/15  Yes Jolaine Artist, MD  apixaban (ELIQUIS) 2.5 MG TABS tablet Take 1 tablet (2.5 mg total) by mouth 2 (two) times daily. 01/19/16  Yes Jolaine Artist, MD  atorvastatin (LIPITOR) 20 MG tablet Take 20 mg by mouth daily at 6 PM.    Yes Historical Provider, MD  B Complex-C (SUPER B COMPLEX) TABS Take 1 tablet by mouth daily.    Yes Historical Provider, MD  calcium carbonate (OS-CAL) 600 MG TABS Take 600 mg by mouth daily with breakfast.    Yes Historical Provider, MD  carvedilol (COREG) 3.125 MG tablet TAKE 1 TABLET BY MOUTH 2 TIMES A DAY WITH A MEAL Patient taking differently: Take 3.125 mg by mouth daily.  01/02/16  Yes Jolaine Artist, MD  cetirizine (ZYRTEC) 10 MG tablet Take 10 mg by mouth daily.  08/24/10  Yes Historical Provider, MD  digoxin (LANOXIN) 0.125 MG tablet Take 0.5 tablets (0.0625 mg total) by mouth daily. 02/06/16  Yes Larey Dresser, MD  doxylamine, Sleep, (UNISOM) 25 MG tablet Take 25 mg by mouth at bedtime.   Yes Historical Provider, MD  fluticasone (FLONASE) 50 MCG/ACT nasal spray Place 1 spray into both nostrils daily as needed for allergies. Reported on 04/11/2015 09/23/14  Yes Historical Provider, MD  furosemide (LASIX) 80 MG tablet TAKE 1 TABLET BY MOUTH EVERY DAY 10/21/15  Yes Jolaine Artist, MD  levothyroxine (SYNTHROID, LEVOTHROID) 112 MCG tablet Take 112 mcg by mouth See admin instructions. Pt takes Monday- Saturday - skips on Sundays   Yes Historical Provider, MD  magnesium gluconate (MAGONATE) 500 MG tablet Take 500 mg by mouth daily.    Yes Historical Provider, MD  Multiple Vitamin (MULTIVITAMIN WITH MINERALS) TABS Take 1 tablet by mouth daily.   Yes Historical Provider, MD  nitroGLYCERIN (NITROSTAT) 0.4 MG  SL tablet Place 1 tablet (0.4 mg total) under the tongue every 5 (five) minutes as needed for chest pain. 09/14/15  Yes Evans Lance, MD  Omega-3 Fatty Acids (FISH OIL) 1200 MG CAPS Take 1,200 mg by mouth daily.    Yes Historical Provider, MD  omeprazole (PRILOSEC) 20 MG capsule Take 20 mg by mouth daily as needed (indigestion).    Yes Historical Provider, MD  potassium chloride SA (K-DUR,KLOR-CON) 20 MEQ tablet Take 1 tablet (20 mEq total) by mouth 2 (two) times daily. 02/16/16  Yes Shirley Friar, PA-C  Saw Palmetto, Serenoa repens, (SAW PALMETTO PO) Take 1 capsule by mouth 2 (two) times daily.   Yes Historical Provider, MD  vitamin C (ASCORBIC ACID) 500 MG tablet Take 500 mg by mouth daily.     Yes Historical Provider, MD    Family History Family History  Problem Relation Age of Onset  . CAD Father   . Hypertension Father   . Heart disease Father   . CAD Brother   . Heart disease Brother   . Heart attack Brother   . Diabetes Brother   . Hypertension Mother   . Hypertension Daughter   . Vascular Disease Daughter     Right Carotid  . Colon cancer Son   . Cancer Son     Colon  . Lung cancer Sister   . Heart disease Sister   . Hypertension Sister   . Colon polyps Brother     x2    Social History Social History  Substance Use Topics  . Smoking status: Former Smoker    Packs/day: 1.00    Years: 50.00    Types: Cigarettes    Quit date: 05/17/1993  . Smokeless tobacco: Current User    Types: Chew  . Alcohol use No     Allergies   Codeine and Morphine   Review of Systems Review of Systems 10/14 systems reviewed and are negative other than those stated in the HPI   Physical Exam Updated Vital Signs BP (!) 126/97 (BP Location: Right Arm)   Pulse 75   Temp 97.4 F (36.3 C) (Oral)   Resp 16   Ht '5\' 11"'$  (1.803 m)   Wt 157 lb 6.4 oz (71.4 kg)   SpO2 94%   BMI 21.95 kg/m   Physical Exam Physical Exam  Nursing note and vitals reviewed. Constitutional:  Well developed, well nourished, non-toxic, and in no acute distress Head: Normocephalic and atraumatic.  Mouth/Throat: Oropharynx is clear and moist. Bite mark over left cheek Neck: Normal range of motion. Neck supple.  Cardiovascular: Normal rate and regular rhythm.   Pulmonary/Chest: Effort normal and breath sounds normal.  no chest wall tenderness 82 year old male with prior history Abdominal: Soft. There is no tenderness. There is no rebound and no guarding.  Musculoskeletal: Normal range of motion.  Neurological: Alert, no facial droop, fluent speech, moves all extremities symmetrically Skin: Skin is warm and dry.  Psychiatric: Cooperative   ED Treatments / Results  Labs (all labs ordered are listed, but only abnormal results are displayed) Labs Reviewed  BASIC METABOLIC PANEL - Abnormal; Notable for the following:       Result Value   Potassium 3.3 (*)    Chloride 97 (*)    BUN 28 (*)    Creatinine, Ser 1.71 (*)    GFR calc non Af Amer 36 (*)    GFR calc Af Amer 42 (*)    All other components within normal limits  CBC - Abnormal; Notable for the following:    Platelets 134 (*)    All other components within normal limits  HEMOGLOBIN A1C  TROPONIN I  TROPONIN I  TROPONIN I  I-STAT TROPOININ, ED    EKG  EKG Interpretation  Date/Time:  Friday February 17 2016 11:21:20 EST Ventricular Rate:  70 PR Interval:  150 QRS Duration: 204 QT Interval:  488 QTC Calculation: 527 R Axis:   -76 Text Interpretation:  AV dual-paced rhythm Abnormal ECG Similar to last EKG  Confirmed by Fran Mcree MD, Reneka Nebergall [phone]) on 02/17/2016 11:55:36 AM       Radiology Dg Chest 2 View  Result Date: 02/17/2016 CLINICAL DATA:  Chest pain EXAM: CHEST  2 VIEW COMPARISON:  10/16/2014 FINDINGS: Cardiac enlargement.  Internal defibrillator unchanged. COPD with hyperinflation. Scarring in the right upper lobe or right middle lobe anteriorly is unchanged. Small bilateral effusions have developed since the  prior study. There is mild vascular congestion and prominent interstitial markings. Findings suggest mild heart failure. COPD Mild vascular congestion and small pleural effusions consistent with mild heart failure. Right upper lobe/right middle lobe scarring. IMPRESSION: No active cardiopulmonary disease. Electronically Signed   By: Franchot Gallo M.D.   On: 02/17/2016 13:21    Procedures Procedures (including critical care time)  Medications Ordered in ED Medications  apixaban (ELIQUIS) tablet 2.5 mg (not administered)  carvedilol (COREG) tablet 3.125 mg (not administered)  furosemide (LASIX) tablet 80 mg (not administered)  amiodarone (PACERONE) tablet 100 mg (not administered)  ALPRAZolam (XANAX) tablet 0.375 mg (not administered)  atorvastatin (LIPITOR) tablet 20 mg (not administered)  levothyroxine (SYNTHROID, LEVOTHROID) tablet 112 mcg (not administered)  pantoprazole (PROTONIX) EC tablet 40 mg (not administered)  magnesium gluconate (MAGONATE) tablet 500 mg (not administered)  0.9 %  sodium chloride infusion (not administered)  gi cocktail (Maalox,Lidocaine,Donnatal) (not administered)  acetaminophen (TYLENOL) tablet 650 mg (not administered)  ondansetron (ZOFRAN) injection 4 mg (not administered)  potassium chloride SA (K-DUR,KLOR-CON) CR tablet 20 mEq (not administered)  digoxin (LANOXIN) tablet 0.0625 mg (not administered)  zolpidem (AMBIEN) tablet 5 mg (not administered)  sodium chloride flush (NS) 0.9 % injection 3 mL (not administered)  sodium chloride flush (NS) 0.9 % injection 3 mL (not administered)  0.9 %  sodium chloride infusion (not administered)  aspirin chewable tablet 81 mg (not administered)  0.9 %  sodium chloride infusion (not administered)     Initial Impression / Assessment and Plan / ED Course  I have reviewed the triage vital signs and the nursing notes.  Pertinent labs & imaging results that were available during my care of the patient were reviewed  by me and considered in  my medical decision making (see chart for details).  Clinical Course     82 year old male with prior cardiovascular history who presents with chest pain. Chest pain-free on arrival. His high risk for ACS, with history concerning for that. Initial troponin is negative, and EKG shows paced rhythm and not acutely ischemic. Chest x-ray visualized shows no acute cardiopulmonary processes. Symptoms does not seem concerning for that for PE, dissection, or other serious intrathoracic or cardiopulmonary processes at this time. Discussed with hospitalist service who will admit patient for observation for the remainder of his cardiac rule out.  Final Clinical Impressions(s) / ED Diagnoses   Final diagnoses:  Chest pain, unspecified type    New Prescriptions Current Discharge Medication List       Forde Dandy, MD 02/17/16 [phone]

## 2016-02-17 NOTE — CV Procedure (Signed)
Cath note:  Findings:  RA = 11 RV = 56/14 PA =  55/26 (38) PCW =  27 mean V wave to 40 Fick cardiac output/index = 2.4/1.3 PVR = 4.5 WU Ao sat = 99% PA sat = 41%, 43%  Assessment: 1. Elevated biventricular pressures with severely depressed cardiac output c/w cardiogenic shock physiology  Plan/Discussion:  Start milrinone and IV diuresis. Stop carvedilol. Will likely need tunneled RIJ PICC for home milrinone once optimized. Hold apixaban. Start heparin in am.  Shine Scrogham,MD 5:54 PM

## 2016-02-18 ENCOUNTER — Inpatient Hospital Stay (HOSPITAL_COMMUNITY): Payer: Medicare HMO

## 2016-02-18 DIAGNOSIS — I5022 Chronic systolic (congestive) heart failure: Secondary | ICD-10-CM

## 2016-02-18 DIAGNOSIS — Z7951 Long term (current) use of inhaled steroids: Secondary | ICD-10-CM | POA: Diagnosis not present

## 2016-02-18 DIAGNOSIS — Z7901 Long term (current) use of anticoagulants: Secondary | ICD-10-CM

## 2016-02-18 DIAGNOSIS — M79671 Pain in right foot: Secondary | ICD-10-CM | POA: Diagnosis not present

## 2016-02-18 DIAGNOSIS — R570 Cardiogenic shock: Secondary | ICD-10-CM | POA: Diagnosis not present

## 2016-02-18 DIAGNOSIS — Z452 Encounter for adjustment and management of vascular access device: Secondary | ICD-10-CM | POA: Diagnosis not present

## 2016-02-18 DIAGNOSIS — J439 Emphysema, unspecified: Secondary | ICD-10-CM

## 2016-02-18 DIAGNOSIS — E039 Hypothyroidism, unspecified: Secondary | ICD-10-CM | POA: Diagnosis not present

## 2016-02-18 DIAGNOSIS — K219 Gastro-esophageal reflux disease without esophagitis: Secondary | ICD-10-CM | POA: Diagnosis not present

## 2016-02-18 DIAGNOSIS — I255 Ischemic cardiomyopathy: Secondary | ICD-10-CM | POA: Diagnosis not present

## 2016-02-18 DIAGNOSIS — Z95810 Presence of automatic (implantable) cardiac defibrillator: Secondary | ICD-10-CM | POA: Diagnosis not present

## 2016-02-18 DIAGNOSIS — J449 Chronic obstructive pulmonary disease, unspecified: Secondary | ICD-10-CM | POA: Diagnosis not present

## 2016-02-18 DIAGNOSIS — F419 Anxiety disorder, unspecified: Secondary | ICD-10-CM | POA: Diagnosis not present

## 2016-02-18 DIAGNOSIS — I10 Essential (primary) hypertension: Secondary | ICD-10-CM

## 2016-02-18 DIAGNOSIS — Z955 Presence of coronary angioplasty implant and graft: Secondary | ICD-10-CM | POA: Diagnosis not present

## 2016-02-18 DIAGNOSIS — Z8673 Personal history of transient ischemic attack (TIA), and cerebral infarction without residual deficits: Secondary | ICD-10-CM | POA: Diagnosis not present

## 2016-02-18 DIAGNOSIS — I5023 Acute on chronic systolic (congestive) heart failure: Secondary | ICD-10-CM | POA: Diagnosis not present

## 2016-02-18 DIAGNOSIS — I429 Cardiomyopathy, unspecified: Secondary | ICD-10-CM | POA: Diagnosis not present

## 2016-02-18 DIAGNOSIS — R69 Illness, unspecified: Secondary | ICD-10-CM | POA: Diagnosis not present

## 2016-02-18 DIAGNOSIS — R079 Chest pain, unspecified: Secondary | ICD-10-CM | POA: Diagnosis not present

## 2016-02-18 DIAGNOSIS — I480 Paroxysmal atrial fibrillation: Secondary | ICD-10-CM

## 2016-02-18 DIAGNOSIS — Z961 Presence of intraocular lens: Secondary | ICD-10-CM | POA: Diagnosis present

## 2016-02-18 DIAGNOSIS — E785 Hyperlipidemia, unspecified: Secondary | ICD-10-CM | POA: Diagnosis not present

## 2016-02-18 DIAGNOSIS — I2510 Atherosclerotic heart disease of native coronary artery without angina pectoris: Secondary | ICD-10-CM | POA: Diagnosis not present

## 2016-02-18 DIAGNOSIS — N183 Chronic kidney disease, stage 3 (moderate): Secondary | ICD-10-CM | POA: Diagnosis not present

## 2016-02-18 DIAGNOSIS — I481 Persistent atrial fibrillation: Secondary | ICD-10-CM | POA: Diagnosis not present

## 2016-02-18 DIAGNOSIS — Z9842 Cataract extraction status, left eye: Secondary | ICD-10-CM | POA: Diagnosis not present

## 2016-02-18 DIAGNOSIS — Z9841 Cataract extraction status, right eye: Secondary | ICD-10-CM | POA: Diagnosis not present

## 2016-02-18 DIAGNOSIS — I252 Old myocardial infarction: Secondary | ICD-10-CM | POA: Diagnosis not present

## 2016-02-18 DIAGNOSIS — Z951 Presence of aortocoronary bypass graft: Secondary | ICD-10-CM | POA: Diagnosis not present

## 2016-02-18 DIAGNOSIS — Z8249 Family history of ischemic heart disease and other diseases of the circulatory system: Secondary | ICD-10-CM | POA: Diagnosis not present

## 2016-02-18 DIAGNOSIS — I739 Peripheral vascular disease, unspecified: Secondary | ICD-10-CM | POA: Diagnosis present

## 2016-02-18 DIAGNOSIS — I130 Hypertensive heart and chronic kidney disease with heart failure and stage 1 through stage 4 chronic kidney disease, or unspecified chronic kidney disease: Secondary | ICD-10-CM | POA: Diagnosis not present

## 2016-02-18 DIAGNOSIS — E876 Hypokalemia: Secondary | ICD-10-CM | POA: Diagnosis not present

## 2016-02-18 LAB — BASIC METABOLIC PANEL
ANION GAP: 11 (ref 5–15)
Anion gap: 12 (ref 5–15)
BUN: 28 mg/dL — AB (ref 6–20)
BUN: 27 mg/dL — AB (ref 6–20)
CALCIUM: 8.6 mg/dL — AB (ref 8.9–10.3)
CO2: 28 mmol/L (ref 22–32)
CO2: 30 mmol/L (ref 22–32)
CREATININE: 1.64 mg/dL — AB (ref 0.61–1.24)
Calcium: 8.6 mg/dL — ABNORMAL LOW (ref 8.9–10.3)
Chloride: 97 mmol/L — ABNORMAL LOW (ref 101–111)
Chloride: 95 mmol/L — ABNORMAL LOW (ref 101–111)
Creatinine, Ser: 1.64 mg/dL — ABNORMAL HIGH (ref 0.61–1.24)
GFR calc Af Amer: 44 mL/min — ABNORMAL LOW (ref >60)
GFR calc Af Amer: 44 mL/min — ABNORMAL LOW (ref >60)
GFR, EST NON AFRICAN AMERICAN: 38 mL/min — AB (ref >60)
GFR, EST NON AFRICAN AMERICAN: 38 mL/min — AB (ref >60)
GLUCOSE: 124 mg/dL — AB (ref 65–99)
GLUCOSE: 106 mg/dL — AB (ref 65–99)
POTASSIUM: 3.1 mmol/L — AB (ref 3.5–5.1)
Potassium: 3.3 mmol/L — ABNORMAL LOW (ref 3.5–5.1)
Sodium: 136 mmol/L (ref 135–145)
Sodium: 137 mmol/L (ref 135–145)

## 2016-02-18 LAB — LIPID PANEL
Cholesterol: 124 mg/dL (ref 0–200)
HDL: 46 mg/dL (ref >40)
LDL Cholesterol: 65 mg/dL (ref 0–99)
TRIGLYCERIDES: 64 mg/dL (ref <150)
Total CHOL/HDL Ratio: 2.7 RATIO
VLDL: 13 mg/dL (ref 0–40)

## 2016-02-18 LAB — COOXEMETRY PANEL
CARBOXYHEMOGLOBIN: 1 % (ref 0.5–1.5)
CARBOXYHEMOGLOBIN: 1.3 % (ref 0.5–1.5)
Carboxyhemoglobin: 1.6 % — ABNORMAL HIGH (ref 0.5–1.5)
METHEMOGLOBIN: 1 % (ref 0.0–1.5)
METHEMOGLOBIN: 1.1 % (ref 0.0–1.5)
Methemoglobin: 1.1 % (ref 0.0–1.5)
O2 SAT: 44.3 %
O2 Saturation: 53.7 %
O2 Saturation: 61.8 %
TOTAL HEMOGLOBIN: 14 g/dL (ref 12.0–16.0)
Total hemoglobin: 13.4 g/dL (ref 12.0–16.0)
Total hemoglobin: 13 g/dL (ref 12.0–16.0)

## 2016-02-18 LAB — CBC
HCT: 40.6 % (ref 39.0–52.0)
Hemoglobin: 13.4 g/dL (ref 13.0–17.0)
MCH: 31.5 pg (ref 26.0–34.0)
MCHC: 33 g/dL (ref 30.0–36.0)
MCV: 95.5 fL (ref 78.0–100.0)
PLATELETS: 136 10*3/uL — AB (ref 150–400)
RBC: 4.25 MIL/uL (ref 4.22–5.81)
RDW: 14.6 % (ref 11.5–15.5)
WBC: 7.2 10*3/uL (ref 4.0–10.5)

## 2016-02-18 LAB — TROPONIN I: Troponin I: 0.04 ng/mL (ref <0.03)

## 2016-02-18 LAB — APTT
aPTT: 75 seconds — ABNORMAL HIGH (ref 24–36)
aPTT: 84 seconds — ABNORMAL HIGH (ref 24–36)

## 2016-02-18 LAB — MRSA PCR SCREENING: MRSA BY PCR: NEGATIVE

## 2016-02-18 LAB — HEMOGLOBIN A1C
HEMOGLOBIN A1C: 5.8 % — AB (ref 4.8–5.6)
MEAN PLASMA GLUCOSE: 120 mg/dL

## 2016-02-18 LAB — HEPARIN LEVEL (UNFRACTIONATED): Heparin Unfractionated: 1 IU/mL — ABNORMAL HIGH (ref 0.30–0.70)

## 2016-02-18 MED ORDER — LORATADINE 10 MG PO TABS
ORAL_TABLET | ORAL | Status: AC
  Filled 2016-02-18: qty 1

## 2016-02-18 MED ORDER — GELATIN ABSORBABLE 12-7 MM EX MISC
1.0000 | Freq: Once | CUTANEOUS | Status: AC
  Administered 2016-02-18: 1 via TOPICAL
  Filled 2016-02-18: qty 1

## 2016-02-18 MED ORDER — GEL-FOAM CUSHION MISC: 1.0000 | Freq: Once | Status: DC

## 2016-02-18 NOTE — Progress Notes (Signed)
ANTICOAGULATION CONSULT NOTE - Follow Up Consult  Pharmacy Consult for Heparin (Eliquis on hold) Indication: atrial fibrillation  Allergies  Allergen Reactions  . Codeine     "tripped out on it"  . Morphine     "tripped out on it"    Patient Measurements: Height: '5\' 11"'$  (180.3 cm) Weight: 158 lb 3.2 oz (71.8 kg) IBW/kg (Calculated) : 75.3 Heparin Dosing Weight: 71.8 kg  Vital Signs: Temp: 98.3 F (36.8 C) (12/02 1635) Temp Source: Oral (12/02 1635) BP: 113/59 (12/02 1635) Pulse Rate: 85 (12/02 1635)  Labs:  Recent Labs  02/16/16 1134 02/17/16 1131 02/17/16 1604 02/17/16 2155 02/18/16 0340 02/18/16 1121 02/18/16 1807  HGB 13.6 13.6  --   --   --  13.4  --   HCT 41.7 40.8  --   --   --  40.6  --   PLT 131* 134*  --   --   --  136*  --   APTT  --   --   --   --   --  75* 84*  HEPARINUNFRC  --   --   --   --   --  1.00*  --   CREATININE 1.64* 1.71*  --   --  1.64* 1.64*  --   TROPONINI  --   --  0.04* 0.04* 0.04*  --   --     Estimated Creatinine Clearance: 36.5 mL/min (by C-G formula based on SCr of 1.64 mg/dL (H)).   Medications:  Scheduled:  . ALPRAZolam  0.375 mg Oral QHS  . amiodarone  100 mg Oral Daily  . atorvastatin  20 mg Oral q1800  . digoxin  0.0625 mg Oral Daily  . furosemide  80 mg Intravenous BID  . levothyroxine  112 mcg Oral QAC breakfast  . magnesium gluconate  500 mg Oral Daily  . multivitamin with minerals  1 tablet Oral Daily  . pantoprazole  40 mg Oral Daily  . potassium chloride SA  20 mEq Oral BID  . sodium chloride flush  10-40 mL Intracatheter Q12H  . sodium chloride flush  3 mL Intravenous Q12H  . sodium chloride flush  3 mL Intravenous Q12H  . zolpidem  5 mg Oral QHS   Infusions:  . sodium chloride    . heparin 900 Units/hr (02/18/16 2505)  . milrinone 0.375 mcg/kg/min (02/18/16 3976)    Assessment: 82 yr old male admitted 12/1 with chest pain - cardiac vs musculoskeletal vs anxiety.  Cath 12/1.  Suspect chest pain from  HF and pt started on milrinone.  Plans for tunnelled cath placement and home milrinone.  Hold Eliquis and bridge with IV heparin. APTT remains therapeutic at 84. No bleeding noted.      Goal of Therapy:  Heparin level 0.3-0.7 units/ml aPTT 66-102 seconds Monitor platelets by anticoagulation protocol: Yes   Plan:  Continue heparin at 900 units/hr. Heparin level, aPTT, and CBC daily  Salome Arnt, PharmD, BCPS Pager # [phone] 02/18/2016 7:29 PM

## 2016-02-18 NOTE — Progress Notes (Signed)
PROGRESS NOTE    Trevor Simpson  LOV:[id number] DOB: [birth year] DOA: 02/17/2016 PCP: [name], MD   Chief Complaint  Patient presents with  . Chest Pain    Brief Narrative:  HPI on 02/17/2016 by [name], PA WARD BOISSONNEAULT is a 82 y.o. male with extensive cardiac medical history including atrial fibrillation, history of pacemaker placement, history of PE, on Eliquis, as well as a history of CVA, history of systolic heart failure and ICM, with EF 20 to 25%, and a history of COPD, presenting with acute onset of central, substernal chest pain, starting this morning, as he is wife was preparing for our cardiac catheterization. He reports that he has been experiencing waxing and waning symptoms over the last few weeks, and has been seen by his cardiologist regarding this issue. In fact, he is scheduled to undergo a right heart catheterization and 02/20/2016. He denies any radiation to the jaw or left arm. The pain is now resolve after a 2nd nitroglycerin in an aspirin. The pain is not worsened with deep inspiration, movement or exertion. He has been increasingly fatigued over the last few weeks. He reports having good and bad days regarding his fatigue and shortness of breath. He denies any dizziness or falls. No presyncope or syncope. He denies any cough, fever chills night sweats, or sick contacts. He denies any vomiting or abdominal pain. Appetite is normal. He denies any leg swelling or calf pain. He denies any headaches or vision changes. No seizures or confusion are reported.No recent long distance trips. He has admitted to significant stress regarding his wife cardiac condition. No new meds. Not on hormonal therapy.  No new herbal supplements. Does not smoke or intake significant amount of ETOH. No recreational drugs. His last cardiac catheterization was in June 2014 and is followed by Dr. Tempie Hoist. During his visit on 02/16/2016, his cardiologist also recommended that the patient  reinforces fluid restriction to less than 2 L a day, with sodium restriction as well to less than 2 g, and he admits to have been abiding to this.  Interim history CHF team consulted, underwent cath, poor cardiac output, currently on milrinone.   Assessment & Plan   Acute on chronic systolic CHF -Echocardiogram 01/19/2016 showed an EF of 25%, restrictive diastolic function -Upon admission, chest x-ray did show mild vascular congestion with small pleural effusions -BNP 2316 -Cardiology consulted and appreciated -s/p catheterization showing elevated biventricular pressures was severely depressed cardiac output consistent with cardiogenic shock physiology. -Cardiology did start milrinone with IV diuresis -Received PICC line -Monitor intake/output, daily weights -Continue Lasix 80 mg IV twice a day  Chest pain/ History of CAD -s/p RI stent  -Troponins cycled, remained flat. Unlikely ACS -Suspect chest pain due to musculoskeletal cause.  Currently chest pain free. -Continue Eliquis, statin  Paroxsymal Atrial fibrillation -CHADSVASC 7 -Continue amiodarone, digoxin -Currently on heparin drip  Chronic kidney disease, Stage III -Baseline creatinine 1.6. Creatinine was 1.71 upon admission, currently 1.64 -Continue to monitor BMP  Hypokalemia -Continue to replace potassium, monitor BMP  Anxiety -Continue Xanax  COPD -Currently appears compensated, continue to monitor closely  GERD -Continue PPI  Right toe pain -likely secondary to callus.  -obtain xray, continue to monitor  DVT Prophylaxis  Heparin   Code Status: Full  Family Communication: Family at bedside  Disposition Plan: Admitted. Continue to monitor, home when stable.   Consultants Cardiology  Procedures  Right heart catheterization  Antibiotics   Anti-infectives    None  Subjective:   [name] seen and examined today.  Feels better today, currently denies chest pain. Denies shortness of  breath, abdominal pain, nausea, vomiting, nausea, vomiting, headache, dizziness. Does complain of right toe pain.  Objective:   Vitals:   02/18/16 0033 02/18/16 0445 02/18/16 0725 02/18/16 1145  BP: 126/65 (!) 116/45 (!) 114/57 116/65  Pulse: 75 78 71 71  Resp: (!) '23 20 18 15  '$ Temp: 97.7 F (36.5 C) 98 F (36.7 C) 98.5 F (36.9 C) 97.9 F (36.6 C)  TempSrc: Oral Oral Oral Oral  SpO2: 93% 94% 90% 94%  Weight:  71.8 kg (158 lb 3.2 oz)    Height:        Intake/Output Summary (Last 24 hours) at 02/18/16 1249 Last data filed at 02/18/16 1100  Gross per 24 hour  Intake           250.17 ml  Output             1175 ml  Net          -924.83 ml   Filed Weights   02/17/16 1630 02/18/16 0445  Weight: 71.4 kg (157 lb 6.4 oz) 71.8 kg (158 lb 3.2 oz)    Exam  General: Well developed, well nourished, NAD, appears stated age  HEENT: NCAT, PERRLA, EOMI, Anicteic Sclera, mucous membranes moist.   Neck: Supple,+ JVD, no masses  Cardiovascular: S1 S2 auscultated, 2/6SEM, RRR  Respiratory: Clear to auscultation bilaterally with equal chest rise  Abdomen: Soft, nontender, nondistended, + bowel sounds  Extremities: warm dry without cyanosis clubbing or edema. Small callus on right great toe. +edema RLE  Neuro: AAOx3, nonfocal  Psych: Normal affect and demeanor with intact judgement and insight   Data Reviewed: I have personally reviewed following labs and imaging studies  CBC:  Recent Labs Lab 02/16/16 1134 02/17/16 1131 02/18/16 1121  WBC 6.6 7.0 7.2  HGB 13.6 13.6 13.4  HCT 41.7 40.8 40.6  MCV 96.5 95.8 95.5  PLT 131* 134* 263*   Basic Metabolic Panel:  Recent Labs Lab 02/16/16 1134 02/17/16 1131 02/18/16 0340 02/18/16 1121  NA 136 136 136 137  K 3.1* 3.3* 3.1* 3.3*  CL 96* 97* 97* 95*  CO2 '30 29 28 30  '$ GLUCOSE 94 98 124* 106*  BUN 26* 28* 28* 27*  CREATININE 1.64* 1.71* 1.64* 1.64*  CALCIUM 8.8* 8.9 8.6* 8.6*   GFR: Estimated Creatinine Clearance:  36.5 mL/min (by C-G formula based on SCr of 1.64 mg/dL (H)). Liver Function Tests: No results for input(s): AST, ALT, ALKPHOS, BILITOT, PROT, ALBUMIN in the last 168 hours. No results for input(s): LIPASE, AMYLASE in the last 168 hours. No results for input(s): AMMONIA in the last 168 hours. Coagulation Profile: No results for input(s): INR, PROTIME in the last 168 hours. Cardiac Enzymes:  Recent Labs Lab 02/17/16 1604 02/17/16 2155 02/18/16 0340  TROPONINI 0.04* 0.04* 0.04*   BNP (last 3 results) No results for input(s): PROBNP in the last 8760 hours. HbA1C:  Recent Labs  02/17/16 1456  HGBA1C 5.8*   CBG: No results for input(s): GLUCAP in the last 168 hours. Lipid Profile:  Recent Labs  02/18/16 0340  CHOL 124  HDL 46  LDLCALC 65  TRIG 64  CHOLHDL 2.7   Thyroid Function Tests: No results for input(s): TSH, T4TOTAL, FREET4, T3FREE, THYROIDAB in the last 72 hours. Anemia Panel: No results for input(s): VITAMINB12, FOLATE, FERRITIN, TIBC, IRON, RETICCTPCT in the last 72 hours. Urine analysis:  Component Value Date/Time   COLORURINE YELLOW 11/20/2012 1123   APPEARANCEUR CLEAR 11/20/2012 1123   LABSPEC 1.004 (L) 11/20/2012 1123   PHURINE 7.0 11/20/2012 1123   GLUCOSEU NEGATIVE 11/20/2012 1123   HGBUR NEGATIVE 11/20/2012 1123   BILIRUBINUR NEGATIVE 11/20/2012 1123   KETONESUR NEGATIVE 11/20/2012 1123   PROTEINUR NEGATIVE 11/20/2012 1123   UROBILINOGEN 0.2 11/20/2012 1123   NITRITE NEGATIVE 11/20/2012 1123   LEUKOCYTESUR NEGATIVE 11/20/2012 1123   Sepsis Labs: '@LABRCNTIP'$ (procalcitonin:4,lacticidven:4)  ) Recent Results (from the past 240 hour(s))  MRSA PCR Screening     Status: None   Collection Time: 02/18/16  3:14 AM  Result Value Ref Range Status   MRSA by PCR NEGATIVE NEGATIVE Final    Comment:        The GeneXpert MRSA Assay (FDA approved for NASAL specimens only), is one component of a comprehensive MRSA colonization surveillance program. It  is not intended to diagnose MRSA infection nor to guide or monitor treatment for MRSA infections.       Radiology Studies: Dg Chest 2 View  Result Date: 02/17/2016 CLINICAL DATA:  Chest pain EXAM: CHEST  2 VIEW COMPARISON:  10/16/2014 FINDINGS: Cardiac enlargement.  Internal defibrillator unchanged. COPD with hyperinflation. Scarring in the right upper lobe or right middle lobe anteriorly is unchanged. Small bilateral effusions have developed since the prior study. There is mild vascular congestion and prominent interstitial markings. Findings suggest mild heart failure. COPD Mild vascular congestion and small pleural effusions consistent with mild heart failure. Right upper lobe/right middle lobe scarring. IMPRESSION: No active cardiopulmonary disease. Electronically Signed   By: Franchot Gallo M.D.   On: 02/17/2016 13:21   Dg Chest Port 1 View  Result Date: 02/17/2016 CLINICAL DATA:  Central line placement EXAM: PORTABLE CHEST 1 VIEW COMPARISON:  02/17/2016 FINDINGS: There is cardiomegaly present. Left-sided multi lead ICD device grossly similar. Consolidation in the left lower lobe with tiny left effusion. Right-sided central venous catheter tip overlies expected location of distal SVC. Mild interstitial infiltrate present in the right upper lobe and medial right lung base. No pneumothorax. IMPRESSION: 1. Right-sided central venous catheter tip appears to overlie the distal SVC. No pneumothorax. 2. Cardiomegaly 3. Small left pleural effusion with dense left lower lobe consolidation. Additional infiltrates in the right upper lobe and right lung base. Electronically Signed   By: Donavan Foil M.D.   On: 02/17/2016 22:45     Scheduled Meds: . ALPRAZolam  0.375 mg Oral QHS  . amiodarone  100 mg Oral Daily  . atorvastatin  20 mg Oral q1800  . digoxin  0.0625 mg Oral Daily  . furosemide  80 mg Intravenous BID  . levothyroxine  112 mcg Oral QAC breakfast  . magnesium gluconate  500 mg Oral  Daily  . multivitamin with minerals  1 tablet Oral Daily  . pantoprazole  40 mg Oral Daily  . potassium chloride SA  20 mEq Oral BID  . sodium chloride flush  10-40 mL Intracatheter Q12H  . sodium chloride flush  3 mL Intravenous Q12H  . sodium chloride flush  3 mL Intravenous Q12H  . zolpidem  5 mg Oral QHS   Continuous Infusions: . sodium chloride    . heparin 900 Units/hr (02/18/16 7169)  . milrinone 0.375 mcg/kg/min (02/18/16 0852)     LOS: 0 days   Time Spent in minutes   30 minutes  Maylyn Narvaiz D.O. on 02/18/2016 at 12:49 PM  Between 7am to 7pm - Pager - [phone]  After 7pm go to [email or web address] - password [email or web address]  And look for the night coverage person covering for me after hours  [facility] Office  [phone]

## 2016-02-18 NOTE — Evaluation (Signed)
Physical Therapy Evaluation/ Discharge  Patient Details Name: Trevor Simpson MRN: [id number] DOB: April 25, 1933 Today's Date: 02/18/2016   History of Present Illness  82 yo admitted with chest pain with acute on chronic CHF. PMHx: Afib, pacemaker, CVA, ICM, COPD, CABG  Clinical Impression  Pt very pleasant with son and grandson present throughout. Pt remains very mobile and active for his age and reports no falls in the last year. Pt utilizing cane today which is recommend for all continued mobility to assist with balance. Pt without pain or weakness maintaining HR 72-76 during activity with sats 94-97% on RA. Pt at baseline functional status with strong family support and no further therapy needs at this time. Recommend daily ambulation acutely to maintain strength and function.     Follow Up Recommendations No PT follow up    Equipment Recommendations  None recommended by PT    Recommendations for Other Services       Precautions / Restrictions Precautions Precautions: Fall      Mobility  Bed Mobility Overal bed mobility: Modified Independent                Transfers Overall transfer level: Modified independent                  Ambulation/Gait Ambulation/Gait assistance: Modified independent (Device/Increase time) Ambulation Distance (Feet): 425 Feet Assistive device: Straight cane Gait Pattern/deviations: Step-through pattern;Decreased stride length;Trunk flexed   Gait velocity interpretation: at or above normal speed for age/gender General Gait Details: smooth steady gait with one standing rest. No gross LOB with use of cane, tends to stay close to wall. Pt and family report gait is better now than PTA  Stairs            Wheelchair Mobility    Modified Rankin (Stroke Patients Only)       Balance Overall balance assessment: Needs assistance   Sitting balance-Leahy Scale: Good       Standing balance-Leahy Scale: Fair                               Pertinent Vitals/Pain Pain Assessment: No/denies pain    Home Living Family/patient expects to be discharged to:: Private residence Living Arrangements: Spouse/significant other Available Help at Discharge: Family;Available 24 hours/day Type of Home: House Home Access: Stairs to enter Entrance Stairs-Rails: None Entrance Stairs-Number of Steps: 1 Home Layout: Two level;Able to live on main level with bedroom/bathroom;Laundry or work area in [place]: Kasandra Knudsen - single point      Prior Function Level of Independence: Independent with assistive device(s)         Comments: goes the [employer] to work out 5days/wk, drives, does Medical sales representative and yard work     Journalist, newspaper        Extremity/Trunk Assessment   Upper Extremity Assessment: Overall WFL for tasks assessed           Lower Extremity Assessment: Overall WFL for tasks assessed      Cervical / Trunk Assessment: Kyphotic (forward head)  Communication   Communication: HOH  Cognition Arousal/Alertness: Awake/alert Behavior During Therapy: WFL for tasks assessed/performed Overall Cognitive Status: Within Functional Limits for tasks assessed                      General Comments      Exercises     Assessment/Plan    PT Assessment Patent does not need any  further PT services  PT Problem List            PT Treatment Interventions      PT Goals (Current goals can be found in the Care Plan section)  Acute Rehab PT Goals PT Goal Formulation: All assessment and education complete, DC therapy    Frequency     Barriers to discharge        Co-evaluation               End of Session Equipment Utilized During Treatment: Gait belt Activity Tolerance: Patient tolerated treatment well Patient left: in chair;with call bell/phone within reach;with family/visitor present Nurse Communication: Mobility status         Time: 1345-1414 PT Time Calculation (min) (ACUTE  ONLY): 29 min   Charges:   PT Evaluation $PT Eval Low Complexity: 1 Procedure     PT G Codes:        Trevor Simpson B Trevor Simpson 2016/02/27, 2:33 PM  Elwyn Reach, [place]

## 2016-02-18 NOTE — Progress Notes (Signed)
Patient ID: Trevor Simpson, male   DOB: 03-17-1934, 82 y.o.   MRN: [id number]   Trevor Simpson is a 82 y.o.year old with history of COPD, symptomatic bradycardia, PAF, HTN, PAD, CVA, chronic systolic heart failure and ischemic cardiomyopathy (EF 20-25% echo 7/16) s/p CRT-D on 09/26/13.   [place] 2014  Left mainstem: The left main is long with 20% stenosis distally.  Left anterior descending (LAD): The left anterior descending artery is moderately calcified in the proximal vessel. There is diffuse disease in the proximal vessel up to 30%. The first diagonal is relatively small and has mild disease proximally. The ramus intermediate branch is a large branch that bifurcates in the mid vessel. The proximal vessel has a 90-95% stenosis.  Left circumflex (LCx): The left circumflex is relatively small. It gives rise to a single marginal branch and then continues in the AV groove. The first marginal branch has diffuse 60-70% stenosis proximally.  Right coronary artery (RCA): The right coronary is occluded proximally. There are right to right and left to right collaterals. Severe two-vessel obstructive coronary disease. Chronic total occlusion of the right coronary Successful stenting of the ramus intermediate branch with a bare-metal stent  Admitted June 2015 with low output HF. Started on short term milrinone. Milrinone was discontinued September 2015.  11/30 he was seen in the HF clinic.Due to increased fatigue and dyspnea he was set up for RHC.  12/1 he presented to the ED with increased dyspnea and chest pain.  Very fatigued. Dyspneic with any activity.    RHC done 12/1: RA = 11 RV = 56/14 PA =  55/26 (38) PCW =  27 mean V wave to 40 Fick cardiac output/index = 2.4/1.3 PVR = 4.5 WU Ao sat = 99% PA sat = 41%, 43%  Milrinone started with low output.   This morning, co-ox better at 54% but still marginal.  Feels ok but has not been out of bed.  CVP 10.  Heparin gtt started and apixaban held with  plan for tunneled catheter for home milrinone.    ECHO 01/19/2016 EF 25%   Vitals:   02/17/16 2130 02/18/16 0033 02/18/16 0445 02/18/16 0725  BP: 131/68 126/65 (!) 116/45 (!) 114/57  Pulse:  75 78 71  Resp: (!) 21 (!) '23 20 18  '$ Temp:  97.7 F (36.5 C) 98 F (36.7 C) 98.5 F (36.9 C)  TempSrc:  Oral Oral Oral  SpO2:  93% 94% 90%  Weight:   158 lb 3.2 oz (71.8 kg)   Height:        Intake/Output Summary (Last 24 hours) at 02/18/16 0852 Last data filed at 02/18/16 0300  Gross per 24 hour  Intake           250.17 ml  Output              475 ml  Net          -224.83 ml    LABS: Basic Metabolic Panel:  Recent Labs  02/17/16 1131 02/18/16 0340  NA 136 136  K 3.3* 3.1*  CL 97* 97*  CO2 29 28  GLUCOSE 98 124*  BUN 28* 28*  CREATININE 1.71* 1.64*  CALCIUM 8.9 8.6*   Liver Function Tests: No results for input(s): AST, ALT, ALKPHOS, BILITOT, PROT, ALBUMIN in the last 72 hours. No results for input(s): LIPASE, AMYLASE in the last 72 hours. CBC:  Recent Labs  02/16/16 1134 02/17/16 1131  WBC 6.6 7.0  HGB 13.6 13.6  HCT 41.7 40.8  MCV 96.5 95.8  PLT 131* 134*   Cardiac Enzymes:  Recent Labs  02/17/16 1604 02/17/16 2155 02/18/16 0340  TROPONINI 0.04* 0.04* 0.04*   BNP: Invalid input(s): POCBNP D-Dimer: No results for input(s): DDIMER in the last 72 hours. Hemoglobin A1C:  Recent Labs  02/17/16 1456  HGBA1C 5.8*   Fasting Lipid Panel:  Recent Labs  02/18/16 0340  CHOL 124  HDL 46  LDLCALC 65  TRIG 64  CHOLHDL 2.7   Thyroid Function Tests: No results for input(s): TSH, T4TOTAL, T3FREE, THYROIDAB in the last 72 hours.  Invalid input(s): FREET3 Anemia Panel: No results for input(s): VITAMINB12, FOLATE, FERRITIN, TIBC, IRON, RETICCTPCT in the last 72 hours.  RADIOLOGY: Dg Chest 2 View  Result Date: 02/17/2016 CLINICAL DATA:  Chest pain EXAM: CHEST  2 VIEW COMPARISON:  10/16/2014 FINDINGS: Cardiac enlargement.  Internal defibrillator  unchanged. COPD with hyperinflation. Scarring in the right upper lobe or right middle lobe anteriorly is unchanged. Small bilateral effusions have developed since the prior study. There is mild vascular congestion and prominent interstitial markings. Findings suggest mild heart failure. COPD Mild vascular congestion and small pleural effusions consistent with mild heart failure. Right upper lobe/right middle lobe scarring. IMPRESSION: No active cardiopulmonary disease. Electronically Signed   By: Franchot Gallo M.D.   On: 02/17/2016 13:21   Dg Chest Port 1 View  Result Date: 02/17/2016 CLINICAL DATA:  Central line placement EXAM: PORTABLE CHEST 1 VIEW COMPARISON:  02/17/2016 FINDINGS: There is cardiomegaly present. Left-sided multi lead ICD device grossly similar. Consolidation in the left lower lobe with tiny left effusion. Right-sided central venous catheter tip overlies expected location of distal SVC. Mild interstitial infiltrate present in the right upper lobe and medial right lung base. No pneumothorax. IMPRESSION: 1. Right-sided central venous catheter tip appears to overlie the distal SVC. No pneumothorax. 2. Cardiomegaly 3. Small left pleural effusion with dense left lower lobe consolidation. Additional infiltrates in the right upper lobe and right lung base. Electronically Signed   By: Donavan Foil M.D.   On: 02/17/2016 22:45    PHYSICAL EXAM General: NAD Neck: JVP 8 cm, no thyromegaly or thyroid nodule.  Lungs: Clear to auscultation bilaterally with normal respiratory effort. CV: Nondisplaced PMI.  Heart regular S1/S2, no S3/S4, 2/6 HSM LLSB.   Abdomen: Soft, nontender, no hepatosplenomegaly, no distention.  Neurologic: Alert and oriented x 3.  Psych: Normal affect. Extremities: No clubbing or cyanosis.   TELEMETRY: Reviewed telemetry pt in A-V sequential pacing  Assessment:  1. Chest Pain: Suspect not due to ACS.  2. A/C Systolic Heart Failure: Ischemic cardiomyopathy S/P CRT-D.   Low output HF by RHC this admission.   3. PAF S/P DC-CV 09/2012: Currently A-V sequential pacing. He is on amiodarone.  4. CAD s/p previous RI stent  5. CKD III  Plan/Discussion:    Suspect main issue is recurrent low output HF. CP likely due to myocardial wall stress and not ACS.  Given normal troponin and CKD will not proceed with coronary angio at this point. Can reconsider as needed.  RHC on 12/1 suggested low output HF as suspected.  He has been started on milrinone at 0.25.  Today, co-ox 54% (improved but still marginal) and CVP 10.  - Increase milrinone to 0.375 today and repeat co-ox.  - Continue Lasix 80 mg IV bid today.  - Check digoxin level in am.  - I suspect he is going to need home milrinone for palliation (not LVAD  candidate given age).  Eliquis on hold and heparin gtt started with plan for tunneled catheter placement hopefully Monday.   Creatinine lower since milrinone begun.   Needs to get out of bed, cardiac rehab.   Loralie Champagne 02/18/2016 8:58 AM

## 2016-02-18 NOTE — Progress Notes (Signed)
ANTICOAGULATION CONSULT NOTE - Follow Up Consult  Pharmacy Consult for Heparin (Eliquis on hold) Indication: atrial fibrillation  Allergies  Allergen Reactions  . Codeine     "tripped out on it"  . Morphine     "tripped out on it"    Patient Measurements: Height: '5\' 11"'$  (180.3 cm) Weight: 158 lb 3.2 oz (71.8 kg) IBW/kg (Calculated) : 75.3 Heparin Dosing Weight: 71.8 kg  Vital Signs: Temp: 97.9 F (36.6 C) (12/02 1145) Temp Source: Oral (12/02 1145) BP: 116/65 (12/02 1145) Pulse Rate: 71 (12/02 1145)  Labs:  Recent Labs  02/16/16 1134 02/17/16 1131 02/17/16 1604 02/17/16 2155 02/18/16 0340 02/18/16 1121  HGB 13.6 13.6  --   --   --  13.4  HCT 41.7 40.8  --   --   --  40.6  PLT 131* 134*  --   --   --  136*  APTT  --   --   --   --   --  75*  HEPARINUNFRC  --   --   --   --   --  1.00*  CREATININE 1.64* 1.71*  --   --  1.64* 1.64*  TROPONINI  --   --  0.04* 0.04* 0.04*  --     Estimated Creatinine Clearance: 36.5 mL/min (by C-G formula based on SCr of 1.64 mg/dL (H)).   Medications:  Scheduled:  . ALPRAZolam  0.375 mg Oral QHS  . amiodarone  100 mg Oral Daily  . atorvastatin  20 mg Oral q1800  . digoxin  0.0625 mg Oral Daily  . furosemide  80 mg Intravenous BID  . levothyroxine  112 mcg Oral QAC breakfast  . magnesium gluconate  500 mg Oral Daily  . multivitamin with minerals  1 tablet Oral Daily  . pantoprazole  40 mg Oral Daily  . potassium chloride SA  20 mEq Oral BID  . sodium chloride flush  10-40 mL Intracatheter Q12H  . sodium chloride flush  3 mL Intravenous Q12H  . sodium chloride flush  3 mL Intravenous Q12H  . zolpidem  5 mg Oral QHS   Infusions:  . sodium chloride    . heparin 900 Units/hr (02/18/16 4401)  . milrinone 0.375 mcg/kg/min (02/18/16 0272)    Assessment: 82 yr old male admitted 12/1 with chest pain - cardiac vs musculoskeletal vs anxiety.  Cath 12/1.  Suspect chest pain from HF and pt started on milrinone.  Plans for  tunnelled cath placement and home milrinone.  Hold Eliquis and bridge with IV heparin.  Initial heparin level elevated (likely from effects of Eliquis) but aPTT therapeutic.      Goal of Therapy:  Heparin level 0.3-0.7 units/ml aPTT 66-102 seconds Monitor platelets by anticoagulation protocol: Yes   Plan:  Continue heparin at 900 units/hr. Confirmation aPTT at 1800 Heparin level, aPTT, and CBC daily  [employer], Pharm.D., BCPS Clinical Pharmacist 02/18/2016 12:47 PM

## 2016-02-19 ENCOUNTER — Other Ambulatory Visit: Payer: Self-pay

## 2016-02-19 DIAGNOSIS — N183 Chronic kidney disease, stage 3 (moderate): Secondary | ICD-10-CM

## 2016-02-19 DIAGNOSIS — I499 Cardiac arrhythmia, unspecified: Secondary | ICD-10-CM

## 2016-02-19 DIAGNOSIS — I2510 Atherosclerotic heart disease of native coronary artery without angina pectoris: Secondary | ICD-10-CM

## 2016-02-19 LAB — CBC
HCT: 40.2 % (ref 39.0–52.0)
HEMOGLOBIN: 13 g/dL (ref 13.0–17.0)
MCH: 31.4 pg (ref 26.0–34.0)
MCHC: 32.3 g/dL (ref 30.0–36.0)
MCV: 97.1 fL (ref 78.0–100.0)
PLATELETS: 135 10*3/uL — AB (ref 150–400)
RBC: 4.14 MIL/uL — ABNORMAL LOW (ref 4.22–5.81)
RDW: 15.3 % (ref 11.5–15.5)
WBC: 7.7 10*3/uL (ref 4.0–10.5)

## 2016-02-19 LAB — BASIC METABOLIC PANEL
ANION GAP: 9 (ref 5–15)
BUN: 21 mg/dL — ABNORMAL HIGH (ref 6–20)
CHLORIDE: 98 mmol/L — AB (ref 101–111)
CO2: 30 mmol/L (ref 22–32)
CREATININE: 1.32 mg/dL — AB (ref 0.61–1.24)
Calcium: 8.3 mg/dL — ABNORMAL LOW (ref 8.9–10.3)
GFR calc non Af Amer: 49 mL/min — ABNORMAL LOW (ref >60)
GFR, EST AFRICAN AMERICAN: 57 mL/min — AB (ref >60)
Glucose, Bld: 120 mg/dL — ABNORMAL HIGH (ref 65–99)
Potassium: 3 mmol/L — ABNORMAL LOW (ref 3.5–5.1)
Sodium: 137 mmol/L (ref 135–145)

## 2016-02-19 LAB — APTT: aPTT: 57 seconds — ABNORMAL HIGH (ref 24–36)

## 2016-02-19 LAB — HEPARIN LEVEL (UNFRACTIONATED): HEPARIN UNFRACTIONATED: 0.68 [IU]/mL (ref 0.30–0.70)

## 2016-02-19 LAB — COOXEMETRY PANEL
Carboxyhemoglobin: 1.8 % — ABNORMAL HIGH (ref 0.5–1.5)
Methemoglobin: 0.9 % (ref 0.0–1.5)
O2 SAT: 80.6 %
TOTAL HEMOGLOBIN: 13.3 g/dL (ref 12.0–16.0)

## 2016-02-19 LAB — DIGOXIN LEVEL: Digoxin Level: 0.5 ng/mL — ABNORMAL LOW (ref 0.8–2.0)

## 2016-02-19 LAB — MAGNESIUM: MAGNESIUM: 2.1 mg/dL (ref 1.7–2.4)

## 2016-02-19 MED ORDER — POTASSIUM CHLORIDE CRYS ER 20 MEQ PO TBCR
40.0000 meq | EXTENDED_RELEASE_TABLET | Freq: Two times a day (BID) | ORAL | Status: DC
  Administered 2016-02-19 – 2016-02-20 (×3): 40 meq via ORAL
  Filled 2016-02-19 (×3): qty 2

## 2016-02-19 MED ORDER — POTASSIUM CHLORIDE CRYS ER 20 MEQ PO TBCR: 40.0000 meq | EXTENDED_RELEASE_TABLET | Freq: Two times a day (BID) | ORAL | Status: DC

## 2016-02-19 NOTE — Progress Notes (Addendum)
PROGRESS NOTE    MAREK NGHIEM  KDX:[id number] DOB: 06-May-1933 DOA: 02/17/2016 PCP: [name], MD   Chief Complaint  Patient presents with  . Chest Pain    Brief Narrative:  HPI on 02/17/2016 by [name], PA YAHMIR SOKOLOV is a 82 y.o. male with extensive cardiac medical history including atrial fibrillation, history of pacemaker placement, history of PE, on Eliquis, as well as a history of CVA, history of systolic heart failure and ICM, with EF 20 to 25%, and a history of COPD, presenting with acute onset of central, substernal chest pain, starting this morning, as he is wife was preparing for our cardiac catheterization. He reports that he has been experiencing waxing and waning symptoms over the last few weeks, and has been seen by his cardiologist regarding this issue. In fact, he is scheduled to undergo a right heart catheterization and 02/20/2016. He denies any radiation to the jaw or left arm. The pain is now resolve after a 2nd nitroglycerin in an aspirin. The pain is not worsened with deep inspiration, movement or exertion. He has been increasingly fatigued over the last few weeks. He reports having good and bad days regarding his fatigue and shortness of breath. He denies any dizziness or falls. No presyncope or syncope. He denies any cough, fever chills night sweats, or sick contacts. He denies any vomiting or abdominal pain. Appetite is normal. He denies any leg swelling or calf pain. He denies any headaches or vision changes. No seizures or confusion are reported.No recent long distance trips. He has admitted to significant stress regarding his wife cardiac condition. No new meds. Not on hormonal therapy.  No new herbal supplements. Does not smoke or intake significant amount of ETOH. No recreational drugs. His last cardiac catheterization was in June 2014 and is followed by Dr. Tempie Hoist. During his visit on 02/16/2016, his cardiologist also recommended that the patient  reinforces fluid restriction to less than 2 L a day, with sodium restriction as well to less than 2 g, and he admits to have been abiding to this.  Interim history CHF team consulted, underwent cath, poor cardiac output, currently on milrinone.   Assessment & Plan   Acute on chronic systolic CHF -Echocardiogram 01/19/2016 showed an EF of 25%, restrictive diastolic function -Upon admission, chest x-ray did show mild vascular congestion with small pleural effusions -BNP 2316 -Cardiology consulted and appreciated -s/p catheterization showing elevated biventricular pressures was severely depressed cardiac output consistent with cardiogenic shock physiology. -Cardiology did start milrinone with IV diuresis -Received PICC line -Monitor intake/output, daily weights -UOP over past 24hrs 3100cc -Continue Lasix 80 mg IV twice a day  Chest pain/ History of CAD -s/p RI stent  -Troponins cycled, remained flat. Unlikely ACS -Suspect chest pain due to musculoskeletal cause.  Currently chest pain free. -Continue Eliquis, statin  Paroxsymal Atrial fibrillation -CHADSVASC 7 -Continue amiodarone, digoxin -Currently on heparin drip  Chronic kidney disease, Stage III -Baseline creatinine 1.6. Creatinine was 1.71 upon admission, currently 1.32 -Continue to monitor BMP  Hypokalemia -Continue to replace potassium, monitor BMP -increased dose. -Likely secondary to diuresis -Will check magnesium level  Anxiety -Continue Xanax  COPD -Currently appears compensated, continue to monitor closely  GERD -Continue PPI  Right toe pain -likely secondary to callus.  -Xray showed multifocal spur formation. No infection or osteo. -patient should follow up with a podiatrist upon discharge  DVT Prophylaxis  Heparin   Code Status: Full  Family Communication: Family at bedside  Disposition  Plan: Admitted. Continue to monitor, home when stable.   Consultants Cardiology  Procedures  Right heart  catheterization  Antibiotics   Anti-infectives    None      Subjective:   [name] seen and examined today.  Feeling better today, currently denies chest pain, feels breathing has improved.  . Denies abdominal pain, nausea, vomiting, nausea, vomiting, headache, dizziness.   Objective:   Vitals:   02/18/16 2230 02/18/16 2356 02/19/16 0500 02/19/16 0807  BP: (!) 116/55 (!) 116/55 (!) 110/49   Pulse: 75 74 81   Resp: '17 20 20   '$ Temp: 98 F (36.7 C) 97.9 F (36.6 C) 98.5 F (36.9 C) 99.1 F (37.3 C)  TempSrc: Oral Oral Oral Oral  SpO2: 93% 95% 97%   Weight:   68.4 kg (150 lb 12.8 oz)   Height:        Intake/Output Summary (Last 24 hours) at 02/19/16 4680 Last data filed at 02/19/16 0600  Gross per 24 hour  Intake            891.5 ml  Output             3100 ml  Net          -2208.5 ml   Filed Weights   02/17/16 1630 02/18/16 0445 02/19/16 0500  Weight: 71.4 kg (157 lb 6.4 oz) 71.8 kg (158 lb 3.2 oz) 68.4 kg (150 lb 12.8 oz)    Exam  General: Well developed, well nourished, elderly male, NAD  HEENT: NCAT,mucous membranes moist.   Neck: Supple,+ JVD, no masses  Cardiovascular: S1 S2 auscultated, 2/6SEM, RRR  Respiratory: Clear to auscultation bilaterally with equal chest rise  Abdomen: Soft, nontender, nondistended, + bowel sounds  Extremities: warm dry without cyanosis clubbing or edema. Small callus on right great toe. +edema RLE  Neuro: AAOx3, nonfocal  Psych: Normal affect and demeanor with intact judgement and insight, pleasant   Data Reviewed: I have personally reviewed following labs and imaging studies  CBC:  Recent Labs Lab 02/16/16 1134 02/17/16 1131 02/18/16 1121 02/19/16 0523  WBC 6.6 7.0 7.2 7.7  HGB 13.6 13.6 13.4 13.0  HCT 41.7 40.8 40.6 40.2  MCV 96.5 95.8 95.5 97.1  PLT 131* 134* 136* 321*   Basic Metabolic Panel:  Recent Labs Lab 02/16/16 1134 02/17/16 1131 02/18/16 0340 02/18/16 1121 02/19/16 0805  NA 136 136  136 137 137  K 3.1* 3.3* 3.1* 3.3* 3.0*  CL 96* 97* 97* 95* 98*  CO2 '30 29 28 30 30  '$ GLUCOSE 94 98 124* 106* 120*  BUN 26* 28* 28* 27* 21*  CREATININE 1.64* 1.71* 1.64* 1.64* 1.32*  CALCIUM 8.8* 8.9 8.6* 8.6* 8.3*   GFR: Estimated Creatinine Clearance: 43.2 mL/min (by C-G formula based on SCr of 1.32 mg/dL (H)). Liver Function Tests: No results for input(s): AST, ALT, ALKPHOS, BILITOT, PROT, ALBUMIN in the last 168 hours. No results for input(s): LIPASE, AMYLASE in the last 168 hours. No results for input(s): AMMONIA in the last 168 hours. Coagulation Profile: No results for input(s): INR, PROTIME in the last 168 hours. Cardiac Enzymes:  Recent Labs Lab 02/17/16 1604 02/17/16 2155 02/18/16 0340  TROPONINI 0.04* 0.04* 0.04*   BNP (last 3 results) No results for input(s): PROBNP in the last 8760 hours. HbA1C:  Recent Labs  02/17/16 1456  HGBA1C 5.8*   CBG: No results for input(s): GLUCAP in the last 168 hours. Lipid Profile:  Recent Labs  02/18/16 0340  CHOL 124  HDL 46  LDLCALC 65  TRIG 64  CHOLHDL 2.7   Thyroid Function Tests: No results for input(s): TSH, T4TOTAL, FREET4, T3FREE, THYROIDAB in the last 72 hours. Anemia Panel: No results for input(s): VITAMINB12, FOLATE, FERRITIN, TIBC, IRON, RETICCTPCT in the last 72 hours. Urine analysis:    Component Value Date/Time   COLORURINE YELLOW 11/20/2012 1123   APPEARANCEUR CLEAR 11/20/2012 1123   LABSPEC 1.004 (L) 11/20/2012 1123   PHURINE 7.0 11/20/2012 1123   GLUCOSEU NEGATIVE 11/20/2012 1123   HGBUR NEGATIVE 11/20/2012 1123   BILIRUBINUR NEGATIVE 11/20/2012 1123   KETONESUR NEGATIVE 11/20/2012 1123   PROTEINUR NEGATIVE 11/20/2012 1123   UROBILINOGEN 0.2 11/20/2012 1123   NITRITE NEGATIVE 11/20/2012 1123   LEUKOCYTESUR NEGATIVE 11/20/2012 1123   Sepsis Labs: '@LABRCNTIP'$ (procalcitonin:4,lacticidven:4)  ) Recent Results (from the past 240 hour(s))  MRSA PCR Screening     Status: None   Collection  Time: 02/18/16  3:14 AM  Result Value Ref Range Status   MRSA by PCR NEGATIVE NEGATIVE Final    Comment:        The GeneXpert MRSA Assay (FDA approved for NASAL specimens only), is one component of a comprehensive MRSA colonization surveillance program. It is not intended to diagnose MRSA infection nor to guide or monitor treatment for MRSA infections.       Radiology Studies: Dg Chest 2 View  Result Date: 02/17/2016 CLINICAL DATA:  Chest pain EXAM: CHEST  2 VIEW COMPARISON:  10/16/2014 FINDINGS: Cardiac enlargement.  Internal defibrillator unchanged. COPD with hyperinflation. Scarring in the right upper lobe or right middle lobe anteriorly is unchanged. Small bilateral effusions have developed since the prior study. There is mild vascular congestion and prominent interstitial markings. Findings suggest mild heart failure. COPD Mild vascular congestion and small pleural effusions consistent with mild heart failure. Right upper lobe/right middle lobe scarring. IMPRESSION: No active cardiopulmonary disease. Electronically Signed   By: Franchot Gallo M.D.   On: 02/17/2016 13:21   Dg Chest Port 1 View  Result Date: 02/17/2016 CLINICAL DATA:  Central line placement EXAM: PORTABLE CHEST 1 VIEW COMPARISON:  02/17/2016 FINDINGS: There is cardiomegaly present. Left-sided multi lead ICD device grossly similar. Consolidation in the left lower lobe with tiny left effusion. Right-sided central venous catheter tip overlies expected location of distal SVC. Mild interstitial infiltrate present in the right upper lobe and medial right lung base. No pneumothorax. IMPRESSION: 1. Right-sided central venous catheter tip appears to overlie the distal SVC. No pneumothorax. 2. Cardiomegaly 3. Small left pleural effusion with dense left lower lobe consolidation. Additional infiltrates in the right upper lobe and right lung base. Electronically Signed   By: Donavan Foil M.D.   On: 02/17/2016 22:45   Dg Foot  Complete Right  Result Date: 02/18/2016 CLINICAL DATA:  Right foot pain for the past 2-3 years. Clinical concern for infection. EXAM: RIGHT FOOT COMPLETE - 3+ VIEW COMPARISON:  None. FINDINGS: Mild to moderate spur formation at the first MTP joint. Small fifth metatarsal head spur. Dorsal navicular spur formation and small inferior calcaneal spur. No soft tissue swelling, soft tissue gas, bone destruction or periosteal reaction. IMPRESSION: Multifocal spur formation. No radiographic evidence of infection or osteomyelitis. Electronically Signed   By: Claudie Revering M.D.   On: 02/18/2016 15:09     Scheduled Meds: . ALPRAZolam  0.375 mg Oral QHS  . amiodarone  100 mg Oral Daily  . atorvastatin  20 mg Oral q1800  . digoxin  0.0625 mg Oral Daily  .  furosemide  80 mg Intravenous BID  . levothyroxine  112 mcg Oral QAC breakfast  . magnesium gluconate  500 mg Oral Daily  . multivitamin with minerals  1 tablet Oral Daily  . pantoprazole  40 mg Oral Daily  . potassium chloride SA  20 mEq Oral BID  . sodium chloride flush  10-40 mL Intracatheter Q12H  . sodium chloride flush  3 mL Intravenous Q12H  . sodium chloride flush  3 mL Intravenous Q12H  . zolpidem  5 mg Oral QHS   Continuous Infusions: . sodium chloride    . heparin 900 Units/hr (02/18/16 0383)  . milrinone 0.375 mcg/kg/min (02/18/16 0852)     LOS: 1 day   Time Spent in minutes   30 minutes  Tamario Heal D.O. on 02/19/2016 at 9:23 AM  Between 7am to 7pm - Pager - [phone]  After 7pm go to [email or web address] - password [email or web address]  And look for the night coverage person covering for me after hours  [facility] Office  [phone]

## 2016-02-19 NOTE — Progress Notes (Signed)
ANTICOAGULATION CONSULT NOTE - Follow Up Consult  Pharmacy Consult for Heparin (Eliquis on hold) Indication: atrial fibrillation  Allergies  Allergen Reactions  . Codeine     "tripped out on it"  . Morphine     "tripped out on it"    Patient Measurements: Height: '5\' 11"'$  (180.3 cm) Weight: 150 lb 12.8 oz (68.4 kg) IBW/kg (Calculated) : 75.3 Heparin Dosing Weight: 71.8 kg  Vital Signs: Temp: 99.1 F (37.3 C) (12/03 0807) Temp Source: Oral (12/03 0807) BP: 110/49 (12/03 0500) Pulse Rate: 81 (12/03 0500)  Labs:  Recent Labs  02/17/16 1131 02/17/16 1604 02/17/16 2155 02/18/16 0340 02/18/16 1121 02/18/16 1807 02/19/16 0523 02/19/16 0805  HGB 13.6  --   --   --  13.4  --  13.0  --   HCT 40.8  --   --   --  40.6  --  40.2  --   PLT 134*  --   --   --  136*  --  135*  --   APTT  --   --   --   --  75* 84* 57*  --   HEPARINUNFRC  --   --   --   --  1.00*  --   --  0.68  CREATININE 1.71*  --   --  1.64* 1.64*  --   --  1.32*  TROPONINI  --  0.04* 0.04* 0.04*  --   --   --   --     Estimated Creatinine Clearance: 43.2 mL/min (by C-G formula based on SCr of 1.32 mg/dL (H)).   Medications:  Scheduled:  . ALPRAZolam  0.375 mg Oral QHS  . amiodarone  100 mg Oral Daily  . atorvastatin  20 mg Oral q1800  . digoxin  0.0625 mg Oral Daily  . furosemide  80 mg Intravenous BID  . levothyroxine  112 mcg Oral QAC breakfast  . magnesium gluconate  500 mg Oral Daily  . multivitamin with minerals  1 tablet Oral Daily  . pantoprazole  40 mg Oral Daily  . potassium chloride SA  40 mEq Oral BID  . sodium chloride flush  10-40 mL Intracatheter Q12H  . sodium chloride flush  3 mL Intravenous Q12H  . sodium chloride flush  3 mL Intravenous Q12H  . zolpidem  5 mg Oral QHS   Infusions:  . sodium chloride    . heparin 900 Units/hr (02/18/16 1660)  . milrinone 0.375 mcg/kg/min (02/18/16 6301)    Assessment: 82 yr old male admitted 12/1 with chest pain - cardiac vs musculoskeletal  vs anxiety.  Cath 12/1.  Suspect chest pain from HF and pt started on milrinone.  Plans for tunnelled cath placement and home milrinone.  Hold Eliquis and bridge with IV heparin.  Heparin level and aPTT correlating with therapeutic heparin level.  Will d/c aPTTs.     Goal of Therapy:  Heparin level 0.3-0.7 units/ml aPTT 66-102 seconds Monitor platelets by anticoagulation protocol: Yes   Plan:  Continue heparin at 900 units/hr. Heparin level and CBC daily Follow-up restart of Eliquis after tunneled cath placement.  [employer], Pharm.D., BCPS Clinical Pharmacist 02/19/2016 9:39 AM

## 2016-02-19 NOTE — Progress Notes (Signed)
Patient ID: Trevor Simpson, male   DOB: 03/07/1934, 82 y.o.   MRN: [id number]   Trevor Simpson is a 82 y.o.year old with history of COPD, symptomatic bradycardia, PAF, HTN, PAD, CVA, chronic systolic heart failure and ischemic cardiomyopathy (EF 20-25% echo 7/16) s/p CRT-D on 09/26/13.   [place] 2014  Left mainstem: The left main is long with 20% stenosis distally.  Left anterior descending (LAD): The left anterior descending artery is moderately calcified in the proximal vessel. There is diffuse disease in the proximal vessel up to 30%. The first diagonal is relatively small and has mild disease proximally. The ramus intermediate branch is a large branch that bifurcates in the mid vessel. The proximal vessel has a 90-95% stenosis.  Left circumflex (LCx): The left circumflex is relatively small. It gives rise to a single marginal branch and then continues in the AV groove. The first marginal branch has diffuse 60-70% stenosis proximally.  Right coronary artery (RCA): The right coronary is occluded proximally. There are right to right and left to right collaterals. Severe two-vessel obstructive coronary disease. Chronic total occlusion of the right coronary Successful stenting of the ramus intermediate branch with a bare-metal stent  Admitted June 2015 with low output HF. Started on short term milrinone. Milrinone was discontinued September 2015.  11/30 he was seen in the HF clinic.Due to increased fatigue and dyspnea he was set up for RHC.  12/1 he presented to the ED with increased dyspnea and chest pain.  Very fatigued. Dyspneic with any activity.    RHC done 12/1: RA = 11 RV = 56/14 PA =  55/26 (38) PCW =  27 mean V wave to 40 Fick cardiac output/index = 2.4/1.3 PVR = 4.5 WU Ao sat = 99% PA sat = 41%, 43%  Milrinone started with low output.   Marginal co-ox on milrinone 0.25, increased to 0.375.  Co-ox 81% today.  CVP 3.  Diuresed well yesterday, weight down.    ECHO 01/19/2016 EF 25%     Vitals:   02/18/16 2230 02/18/16 2356 02/19/16 0500 02/19/16 0807  BP: (!) 116/55 (!) 116/55 (!) 110/49   Pulse: 75 74 81   Resp: '17 20 20   '$ Temp: 98 F (36.7 C) 97.9 F (36.6 C) 98.5 F (36.9 C) 99.1 F (37.3 C)  TempSrc: Oral Oral Oral Oral  SpO2: 93% 95% 97%   Weight:   150 lb 12.8 oz (68.4 kg)   Height:        Intake/Output Summary (Last 24 hours) at 02/19/16 0948 Last data filed at 02/19/16 0600  Gross per 24 hour  Intake            891.5 ml  Output             3100 ml  Net          -2208.5 ml    LABS: Basic Metabolic Panel:  Recent Labs  02/18/16 1121 02/19/16 0805  NA 137 137  K 3.3* 3.0*  CL 95* 98*  CO2 30 30  GLUCOSE 106* 120*  BUN 27* 21*  CREATININE 1.64* 1.32*  CALCIUM 8.6* 8.3*   Liver Function Tests: No results for input(s): AST, ALT, ALKPHOS, BILITOT, PROT, ALBUMIN in the last 72 hours. No results for input(s): LIPASE, AMYLASE in the last 72 hours. CBC:  Recent Labs  02/18/16 1121 02/19/16 0523  WBC 7.2 7.7  HGB 13.4 13.0  HCT 40.6 40.2  MCV 95.5 97.1  PLT 136* 135*  Cardiac Enzymes:  Recent Labs  02/17/16 1604 02/17/16 2155 02/18/16 0340  TROPONINI 0.04* 0.04* 0.04*   BNP: Invalid input(s): POCBNP D-Dimer: No results for input(s): DDIMER in the last 72 hours. Hemoglobin A1C:  Recent Labs  02/17/16 1456  HGBA1C 5.8*   Fasting Lipid Panel:  Recent Labs  02/18/16 0340  CHOL 124  HDL 46  LDLCALC 65  TRIG 64  CHOLHDL 2.7   Thyroid Function Tests: No results for input(s): TSH, T4TOTAL, T3FREE, THYROIDAB in the last 72 hours.  Invalid input(s): FREET3 Anemia Panel: No results for input(s): VITAMINB12, FOLATE, FERRITIN, TIBC, IRON, RETICCTPCT in the last 72 hours.  RADIOLOGY: Dg Chest 2 View  Result Date: 02/17/2016 CLINICAL DATA:  Chest pain EXAM: CHEST  2 VIEW COMPARISON:  10/16/2014 FINDINGS: Cardiac enlargement.  Internal defibrillator unchanged. COPD with hyperinflation. Scarring in the right upper lobe  or right middle lobe anteriorly is unchanged. Small bilateral effusions have developed since the prior study. There is mild vascular congestion and prominent interstitial markings. Findings suggest mild heart failure. COPD Mild vascular congestion and small pleural effusions consistent with mild heart failure. Right upper lobe/right middle lobe scarring. IMPRESSION: No active cardiopulmonary disease. Electronically Signed   By: Franchot Gallo M.D.   On: 02/17/2016 13:21   Dg Chest Port 1 View  Result Date: 02/17/2016 CLINICAL DATA:  Central line placement EXAM: PORTABLE CHEST 1 VIEW COMPARISON:  02/17/2016 FINDINGS: There is cardiomegaly present. Left-sided multi lead ICD device grossly similar. Consolidation in the left lower lobe with tiny left effusion. Right-sided central venous catheter tip overlies expected location of distal SVC. Mild interstitial infiltrate present in the right upper lobe and medial right lung base. No pneumothorax. IMPRESSION: 1. Right-sided central venous catheter tip appears to overlie the distal SVC. No pneumothorax. 2. Cardiomegaly 3. Small left pleural effusion with dense left lower lobe consolidation. Additional infiltrates in the right upper lobe and right lung base. Electronically Signed   By: Donavan Foil M.D.   On: 02/17/2016 22:45   Dg Foot Complete Right  Result Date: 02/18/2016 CLINICAL DATA:  Right foot pain for the past 2-3 years. Clinical concern for infection. EXAM: RIGHT FOOT COMPLETE - 3+ VIEW COMPARISON:  None. FINDINGS: Mild to moderate spur formation at the first MTP joint. Small fifth metatarsal head spur. Dorsal navicular spur formation and small inferior calcaneal spur. No soft tissue swelling, soft tissue gas, bone destruction or periosteal reaction. IMPRESSION: Multifocal spur formation. No radiographic evidence of infection or osteomyelitis. Electronically Signed   By: Claudie Revering M.D.   On: 02/18/2016 15:09    PHYSICAL EXAM General: NAD Neck: JVP  not elevated, no thyromegaly or thyroid nodule.  Lungs: Clear to auscultation bilaterally with normal respiratory effort. CV: Nondisplaced PMI.  Heart regular S1/S2, no S3/S4, 2/6 HSM LLSB.   Abdomen: Soft, nontender, no hepatosplenomegaly, no distention.  Neurologic: Alert and oriented x 3.  Psych: Normal affect. Extremities: No clubbing or cyanosis.   TELEMETRY: Reviewed telemetry pt in A-V sequential pacing  Assessment:  1. Chest Pain: Suspect not due to ACS.  2. A/C Systolic Heart Failure: Ischemic cardiomyopathy S/P CRT-D.  Low output HF by RHC this admission.   3. PAF S/P DC-CV 09/2012: Currently A-V sequential pacing. He is on amiodarone.  4. CAD s/p previous RI stent  5. CKD III  Plan/Discussion:    Suspect main issue is recurrent low output HF. CP likely due to myocardial wall stress and not ACS.  Given normal troponin and CKD  will not proceed with coronary angio at this point. Can reconsider as needed.  RHC on 12/1 suggested low output HF as suspected.  He has been started on milrinone at 0.25 the increased to 0.375.  Today, co-ox 81% and CVP 3 after good diuresis yesterday.  - Continue current milrinone.  - Stop IV Lasix, restart Lasix 80 mg po qd tomorrow.  - Continue digoxin, level ok.   - I suspect he is going to need home milrinone for palliation (not LVAD candidate given age).  Eliquis on hold and heparin gtt started with plan for tunneled catheter placement hopefully Monday (ordered).   Creatinine lower since milrinone begun. Replace K.   Has walked with cardiac rehab.   Loralie Champagne 02/19/2016 9:48 AM

## 2016-02-20 ENCOUNTER — Inpatient Hospital Stay (HOSPITAL_COMMUNITY): Payer: Medicare HMO

## 2016-02-20 ENCOUNTER — Encounter (HOSPITAL_COMMUNITY): Admission: RE | Payer: Self-pay | Source: Ambulatory Visit

## 2016-02-20 ENCOUNTER — Ambulatory Visit (HOSPITAL_COMMUNITY): Admission: RE | Admit: 2016-02-20 | Payer: Medicare HMO | Source: Ambulatory Visit | Admitting: Internal Medicine

## 2016-02-20 ENCOUNTER — Encounter (HOSPITAL_COMMUNITY): Payer: Self-pay | Admitting: Internal Medicine

## 2016-02-20 HISTORY — PX: IR GENERIC HISTORICAL: IMG1180011

## 2016-02-20 LAB — CBC
HCT: 37.6 % — ABNORMAL LOW (ref 39.0–52.0)
HEMOGLOBIN: 12.4 g/dL — AB (ref 13.0–17.0)
MCH: 31.4 pg (ref 26.0–34.0)
MCHC: 33 g/dL (ref 30.0–36.0)
MCV: 95.2 fL (ref 78.0–100.0)
Platelets: 153 10*3/uL (ref 150–400)
RBC: 3.95 MIL/uL — ABNORMAL LOW (ref 4.22–5.81)
RDW: 14.4 % (ref 11.5–15.5)
WBC: 7.2 10*3/uL (ref 4.0–10.5)

## 2016-02-20 LAB — COOXEMETRY PANEL
Carboxyhemoglobin: 2.2 % — ABNORMAL HIGH (ref 0.5–1.5)
METHEMOGLOBIN: 0.8 % (ref 0.0–1.5)
O2 SAT: 77 %
TOTAL HEMOGLOBIN: 12.8 g/dL (ref 12.0–16.0)

## 2016-02-20 LAB — BASIC METABOLIC PANEL
ANION GAP: 7 (ref 5–15)
BUN: 22 mg/dL — AB (ref 6–20)
CHLORIDE: 100 mmol/L — AB (ref 101–111)
CO2: 28 mmol/L (ref 22–32)
Calcium: 8.3 mg/dL — ABNORMAL LOW (ref 8.9–10.3)
Creatinine, Ser: 1.37 mg/dL — ABNORMAL HIGH (ref 0.61–1.24)
GFR, EST AFRICAN AMERICAN: 55 mL/min — AB (ref >60)
GFR, EST NON AFRICAN AMERICAN: 47 mL/min — AB (ref >60)
Glucose, Bld: 104 mg/dL — ABNORMAL HIGH (ref 65–99)
POTASSIUM: 3.7 mmol/L (ref 3.5–5.1)
SODIUM: 135 mmol/L (ref 135–145)

## 2016-02-20 LAB — HEPARIN LEVEL (UNFRACTIONATED): HEPARIN UNFRACTIONATED: 0.4 [IU]/mL (ref 0.30–0.70)

## 2016-02-20 SURGERY — RIGHT HEART CATH
Anesthesia: LOCAL

## 2016-02-20 MED ORDER — POTASSIUM CHLORIDE CRYS ER 20 MEQ PO TBCR
20.0000 meq | EXTENDED_RELEASE_TABLET | Freq: Two times a day (BID) | ORAL | Status: DC
  Administered 2016-02-20 – 2016-02-21 (×2): 20 meq via ORAL
  Filled 2016-02-20 (×2): qty 1

## 2016-02-20 MED ORDER — LIDOCAINE HCL 1 % IJ SOLN
INTRAMUSCULAR | Status: DC | PRN
  Administered 2016-02-20: 20 mL

## 2016-02-20 MED ORDER — CHLORHEXIDINE GLUCONATE 4 % EX LIQD
CUTANEOUS | Status: AC
  Filled 2016-02-20: qty 15

## 2016-02-20 MED ORDER — LIDOCAINE HCL 1 % IJ SOLN
INTRAMUSCULAR | Status: AC
  Filled 2016-02-20: qty 20

## 2016-02-20 MED ORDER — FUROSEMIDE 80 MG PO TABS
80.0000 mg | ORAL_TABLET | Freq: Every day | ORAL | Status: DC
  Administered 2016-02-20 – 2016-02-21 (×2): 80 mg via ORAL
  Filled 2016-02-20 (×2): qty 1

## 2016-02-20 NOTE — Procedures (Signed)
Tunneled R IJ CVC No complication No blood loss. See complete dictation in [facility].

## 2016-02-20 NOTE — Progress Notes (Signed)
ANTICOAGULATION CONSULT NOTE - Follow Up Consult  Pharmacy Consult for Heparin (Eliquis on hold) Indication: atrial fibrillation  Allergies  Allergen Reactions  . Codeine     "tripped out on it"  . Morphine     "tripped out on it"    Patient Measurements: Height: '5\' 11"'$  (180.3 cm) Weight: 153 lb 9.6 oz (69.7 kg) IBW/kg (Calculated) : 75.3 Heparin Dosing Weight: 71.8 kg  Vital Signs: Temp: 98.7 F (37.1 C) (12/04 0845) Temp Source: Oral (12/04 0845) BP: 105/51 (12/04 0845) Pulse Rate: 75 (12/04 0845)  Labs:  Recent Labs  02/17/16 1604 02/17/16 2155 02/18/16 0340 02/18/16 1121 02/18/16 1807 02/19/16 0523 02/19/16 0805 02/20/16 0450  HGB  --   --   --  13.4  --  13.0  --  12.4*  HCT  --   --   --  40.6  --  40.2  --  37.6*  PLT  --   --   --  136*  --  135*  --  153  APTT  --   --   --  75* 84* 57*  --   --   HEPARINUNFRC  --   --   --  1.00*  --   --  0.68 0.40  CREATININE  --   --  1.64* 1.64*  --   --  1.32* 1.37*  TROPONINI 0.04* 0.04* 0.04*  --   --   --   --   --     Estimated Creatinine Clearance: 42.4 mL/min (by C-G formula based on SCr of 1.37 mg/dL (H)).   Medications:  Scheduled:  . ALPRAZolam  0.375 mg Oral QHS  . amiodarone  100 mg Oral Daily  . atorvastatin  20 mg Oral q1800  . digoxin  0.0625 mg Oral Daily  . furosemide  80 mg Oral Daily  . levothyroxine  112 mcg Oral QAC breakfast  . magnesium gluconate  500 mg Oral Daily  . multivitamin with minerals  1 tablet Oral Daily  . pantoprazole  40 mg Oral Daily  . potassium chloride SA  20 mEq Oral BID  . sodium chloride flush  10-40 mL Intracatheter Q12H  . sodium chloride flush  3 mL Intravenous Q12H  . sodium chloride flush  3 mL Intravenous Q12H  . zolpidem  5 mg Oral QHS   Infusions:  . sodium chloride    . heparin 900 Units/hr (02/18/16 1761)  . milrinone 0.375 mcg/kg/min (02/20/16 0011)    Assessment: 82 yr old male admitted 12/1 with chest pain - cardiac vs musculoskeletal vs  anxiety.  Cath 12/1. CO low- suspect chest pain from HF and pt started on milrinone.  Plans for tunnelled cath placement and home milrinone.  Hold Eliquis and bridge with IV heparin until post procedure.   Heparin drip 900 uts/hr HL 0.04 - at goal - no bleeding.   Heparin level and aPTT correlating with therapeutic heparin level.  Will d/c aPTTs and use HL to dose heparin.     Goal of Therapy:  HL 0.3-0.7 Monitor platelets by anticoagulation protocol: Yes   Plan:  Continue heparin at 900 units/hr. Heparin level and CBC daily Follow-up restart of Eliquis after tunneled cath placement.  Bonnita Nasuti Pharm.D. CPP, BCPS Clinical Pharmacist [phone] 02/20/2016 10:12 AM

## 2016-02-20 NOTE — Progress Notes (Signed)
Patient ID: Trevor Simpson, male   DOB: 08-14-1933, 82 y.o.   MRN: [id number]   Trevor Simpson is a 82 y.o.year old with history of COPD, symptomatic bradycardia, PAF, HTN, PAD, CVA, chronic systolic heart failure and ischemic cardiomyopathy (EF 20-25% echo 7/16) s/p CRT-D on 09/26/13.   [place] 2014  Left mainstem: The left main is long with 20% stenosis distally.  Left anterior descending (LAD): The left anterior descending artery is moderately calcified in the proximal vessel. There is diffuse disease in the proximal vessel up to 30%. The first diagonal is relatively small and has mild disease proximally. The ramus intermediate branch is a large branch that bifurcates in the mid vessel. The proximal vessel has a 90-95% stenosis.  Left circumflex (LCx): The left circumflex is relatively small. It gives rise to a single marginal branch and then continues in the AV groove. The first marginal branch has diffuse 60-70% stenosis proximally.  Right coronary artery (RCA): The right coronary is occluded proximally. There are right to right and left to right collaterals. Severe two-vessel obstructive coronary disease. Chronic total occlusion of the right coronary Successful stenting of the ramus intermediate branch with a bare-metal stent  Admitted June 2015 with low output HF. Started on short term milrinone. Milrinone was discontinued September 2015.  11/30 he was seen in the HF clinic.Due to increased fatigue and dyspnea he was set up for RHC.  12/1 he presented to the ED with increased dyspnea and chest pain.  Very fatigued. Dyspneic with any activity.    RHC done 12/1: RA = 11 RV = 56/14 PA =  55/26 (38) PCW =  27 mean V wave to 40 Fick cardiac output/index = 2.4/1.3 PVR = 4.5 WU Ao sat = 99% PA sat = 41%, 43%  Milrinone started with low output.   Feeling better than admit.  More energy. Appetite great.  Coox 77% this am on milrinone 0.375 mcg/kg/min. CVP 7. Good UO. Weight shows up 3 lbs.    ECHO 01/19/2016 EF 25%   Vitals:   02/19/16 1652 02/19/16 2024 02/20/16 0038 02/20/16 0449  BP: (!) 112/47 106/78 111/61 (!) 103/49  Pulse: 71 73 73 73  Resp: 16 19 (!) 23 19  Temp: 97.7 F (36.5 C) 97.5 F (36.4 C) 98.3 F (36.8 C) 98.5 F (36.9 C)  TempSrc: Oral Oral Oral Oral  SpO2: 98% 98% 97% 98%  Weight:    153 lb 9.6 oz (69.7 kg)  Height:        Intake/Output Summary (Last 24 hours) at 02/20/16 0942 Last data filed at 02/20/16 0850  Gross per 24 hour  Intake           542.73 ml  Output             1250 ml  Net          -707.27 ml    LABS: Basic Metabolic Panel:  Recent Labs  02/19/16 0805 02/20/16 0450  NA 137 135  K 3.0* 3.7  CL 98* 100*  CO2 30 28  GLUCOSE 120* 104*  BUN 21* 22*  CREATININE 1.32* 1.37*  CALCIUM 8.3* 8.3*  MG 2.1  --    Liver Function Tests: No results for input(s): AST, ALT, ALKPHOS, BILITOT, PROT, ALBUMIN in the last 72 hours. No results for input(s): LIPASE, AMYLASE in the last 72 hours. CBC:  Recent Labs  02/19/16 0523 02/20/16 0450  WBC 7.7 7.2  HGB 13.0 12.4*  HCT 40.2 37.6*  MCV 97.1 95.2  PLT 135* 153   Cardiac Enzymes:  Recent Labs  02/17/16 1604 02/17/16 2155 02/18/16 0340  TROPONINI 0.04* 0.04* 0.04*   BNP: Invalid input(s): POCBNP D-Dimer: No results for input(s): DDIMER in the last 72 hours. Hemoglobin A1C:  Recent Labs  02/17/16 1456  HGBA1C 5.8*   Fasting Lipid Panel:  Recent Labs  02/18/16 0340  CHOL 124  HDL 46  LDLCALC 65  TRIG 64  CHOLHDL 2.7   Thyroid Function Tests: No results for input(s): TSH, T4TOTAL, T3FREE, THYROIDAB in the last 72 hours.  Invalid input(s): FREET3 Anemia Panel: No results for input(s): VITAMINB12, FOLATE, FERRITIN, TIBC, IRON, RETICCTPCT in the last 72 hours.  RADIOLOGY: Dg Chest 2 View  Result Date: 02/17/2016 CLINICAL DATA:  Chest pain EXAM: CHEST  2 VIEW COMPARISON:  10/16/2014 FINDINGS: Cardiac enlargement.  Internal defibrillator unchanged.  COPD with hyperinflation. Scarring in the right upper lobe or right middle lobe anteriorly is unchanged. Small bilateral effusions have developed since the prior study. There is mild vascular congestion and prominent interstitial markings. Findings suggest mild heart failure. COPD Mild vascular congestion and small pleural effusions consistent with mild heart failure. Right upper lobe/right middle lobe scarring. IMPRESSION: No active cardiopulmonary disease. Electronically Signed   By: Franchot Gallo M.D.   On: 02/17/2016 13:21   Dg Chest Port 1 View  Result Date: 02/17/2016 CLINICAL DATA:  Central line placement EXAM: PORTABLE CHEST 1 VIEW COMPARISON:  02/17/2016 FINDINGS: There is cardiomegaly present. Left-sided multi lead ICD device grossly similar. Consolidation in the left lower lobe with tiny left effusion. Right-sided central venous catheter tip overlies expected location of distal SVC. Mild interstitial infiltrate present in the right upper lobe and medial right lung base. No pneumothorax. IMPRESSION: 1. Right-sided central venous catheter tip appears to overlie the distal SVC. No pneumothorax. 2. Cardiomegaly 3. Small left pleural effusion with dense left lower lobe consolidation. Additional infiltrates in the right upper lobe and right lung base. Electronically Signed   By: Donavan Foil M.D.   On: 02/17/2016 22:45   Dg Foot Complete Right  Result Date: 02/18/2016 CLINICAL DATA:  Right foot pain for the past 2-3 years. Clinical concern for infection. EXAM: RIGHT FOOT COMPLETE - 3+ VIEW COMPARISON:  None. FINDINGS: Mild to moderate spur formation at the first MTP joint. Small fifth metatarsal head spur. Dorsal navicular spur formation and small inferior calcaneal spur. No soft tissue swelling, soft tissue gas, bone destruction or periosteal reaction. IMPRESSION: Multifocal spur formation. No radiographic evidence of infection or osteomyelitis. Electronically Signed   By: Claudie Revering M.D.   On:  02/18/2016 15:09    PHYSICAL EXAM General: Elderly. Chronically ill appearing. Neck: JVP not elevated. No thyromegaly or nodule noted.  Lungs: CTAB, normal effort CV: Nondisplaced PMI.  Heart regular S1/S2, no S3/S4, 2/6 HSM LLSB.   Abdomen: Soft, NT, ND, no HSM. No bruits or masses. +BS   Neurologic: Alert and oriented x 3.  Psych: Normal affect. Extremities: No clubbing or cyanosis.   TELEMETRY: Reviewed, Pt in AV sequential pacing.   Assessment:  1. Chest Pain: Suspect not due to ACS.  2. A/C Systolic Heart Failure: Ischemic cardiomyopathy S/P CRT-D.  Low output HF by RHC this admission.   3. PAF S/P DC-CV 09/2012: Currently A-V sequential pacing. He is on amiodarone.  4. CAD s/p previous RI stent  5. CKD III  Plan/Discussion:    Suspect main issue is recurrent low output HF. CP likely due to  myocardial wall stress and not ACS.  Given normal troponin and CKD will not proceed with coronary angio at this point. Can reconsider as needed.  RHC on 12/1 suggested low output HF as suspected.   - Coox stable now on 0.375 mcg/kg/min. CVP 7-8.  - Restart po lasix 80 mg daily.   - Will have tunneled PICC placed today.  Home today after vs tomorrow am.  He is not a candidate for LVAD or transplant due to age.  - Continue digoxin, level ok.   - Had realistic talk with patient about prognosis.  They want to talk as a family about Goals of Care.   Creatinine stable. K stable.    Continue cardiac rehab.    Shirley Friar, PA-C 02/20/16   Advanced Heart Failure Team Pager [phone] (M-F; 7a - 4p)  Please contact [facility] for night-coverage after hours (4p -7a ) and weekends on [email or web address]  Patient seen and examined with Oda Kilts, PA-C. We discussed all aspects of the encounter. I agree with the assessment and plan as stated above.   He is improved on milrinone. Co-ox and CVP much better. Renal function stable. Now s/p placement on tunneled PICC. Will plan likely d/c  on milrinone in am.   Myndi Wamble,MD 6:02 PM

## 2016-02-20 NOTE — Progress Notes (Signed)
PROGRESS NOTE    Trevor Simpson  WVP:[id number] DOB: 05-Oct-1933 DOA: 02/17/2016 PCP: Trevor Gravel, MD   Chief Complaint  Patient presents with  . Chest Pain    Brief Narrative:  HPI on 02/17/2016 by Ms. Trevor Butters, PA VEDH PTACEK is a 82 y.o. male with extensive cardiac medical history including atrial fibrillation, history of pacemaker placement, history of PE, on Eliquis, as well as a history of CVA, history of systolic heart failure and ICM, with EF 20 to 25%, and a history of COPD, presenting with acute onset of central, substernal chest pain, starting this morning, as he is wife was preparing for our cardiac catheterization. He reports that he has been experiencing waxing and waning symptoms over the last few weeks, and has been seen by his cardiologist regarding this issue. In fact, he is scheduled to undergo a right heart catheterization and 02/20/2016. He denies any radiation to the jaw or left arm. The pain is now resolve after a 2nd nitroglycerin in an aspirin. The pain is not worsened with deep inspiration, movement or exertion. He has been increasingly fatigued over the last few weeks. He reports having good and bad days regarding his fatigue and shortness of breath. He denies any dizziness or falls. No presyncope or syncope. He denies any cough, fever chills night sweats, or sick contacts. He denies any vomiting or abdominal pain. Appetite is normal. He denies any leg swelling or calf pain. He denies any headaches or vision changes. No seizures or confusion are reported.No recent long distance trips. He has admitted to significant stress regarding his wife cardiac condition. No new meds. Not on hormonal therapy.  No new herbal supplements. Does not smoke or intake significant amount of ETOH. No recreational drugs. His last cardiac catheterization was in June 2014 and is followed by Dr. Tempie Hoist. During his visit on 02/16/2016, his cardiologist also recommended that the patient  reinforces fluid restriction to less than 2 L a day, with sodium restriction as well to less than 2 g, and he admits to have been abiding to this.  Interim history CHF team consulted, underwent cath, poor cardiac output, currently on milrinone.   Assessment & Plan   Acute on chronic systolic CHF -Echocardiogram 01/19/2016 showed an EF of 25%, restrictive diastolic function -Upon admission, chest x-ray did show mild vascular congestion with small pleural effusions -BNP 2316 -Cardiology consulted and appreciated -s/p catheterization showing elevated biventricular pressures was severely depressed cardiac output consistent with cardiogenic shock physiology. -Cardiology did start milrinone with IV diuresis -Monitor intake/output, daily weights -UOP over past 24hrs 1450cc -IV lasix discontinued 12/3. Plan for PO lasix today (per cardiology note) -Plan for tunneled cath today  Chest pain/ History of CAD -s/p RI stent  -Troponins cycled, remained flat. Unlikely ACS -Suspect chest pain due to musculoskeletal cause.  Currently chest pain free. -Continue Eliquis, statin  Paroxsymal Atrial fibrillation -CHADSVASC 7 -Continue amiodarone, digoxin -Currently on heparin drip  Chronic kidney disease, Stage III -Baseline creatinine 1.6. Creatinine was 1.71 upon admission, currently 1.37 -Continue to monitor BMP  Hypokalemia -Improving K 3.7, continue to monitor BMP -Continue potassium supplementation  -Likely secondary to diuresis -Magnesium level 2.1  Anxiety -Continue Xanax  COPD -Currently appears compensated, continue to monitor closely  GERD -Continue PPI  Right toe pain -likely secondary to callus.  -Xray showed multifocal spur formation. No infection or osteo. -patient should follow up with a podiatrist upon discharge  DVT Prophylaxis  Heparin   Code Status: Full  Family Communication: Family at bedside  Disposition Plan: Admitted. Continue to monitor, home when  stable. CM consulted for home health.  Consultants Cardiology  Procedures  Right heart catheterization  Antibiotics   Anti-infectives    None      Subjective:   Trevor Simpson seen and examined today.  Feeling better today.  Denies chest pain, feels breathing has improved. Denies abdominal pain, nausea, vomiting, nausea, vomiting, headache, dizziness.  Had bowel movement yesterday.   Objective:   Vitals:   02/19/16 2024 02/20/16 0038 02/20/16 0449 02/20/16 0845  BP: 106/78 111/61 (!) 103/49   Pulse: 73 73 73   Resp: 19 (!) 23 19   Temp: 97.5 F (36.4 C) 98.3 F (36.8 C) 98.5 F (36.9 C) 98.7 F (37.1 C)  TempSrc: Oral Oral Oral Oral  SpO2: 98% 97% 98%   Weight:   69.7 kg (153 lb 9.6 oz)   Height:        Intake/Output Summary (Last 24 hours) at 02/20/16 1003 Last data filed at 02/20/16 0850  Gross per 24 hour  Intake           542.73 ml  Output             1250 ml  Net          -707.27 ml   Filed Weights   02/18/16 0445 02/19/16 0500 02/20/16 0449  Weight: 71.8 kg (158 lb 3.2 oz) 68.4 kg (150 lb 12.8 oz) 69.7 kg (153 lb 9.6 oz)    Exam  General: Well developed, well nourished, elderly male, no distress  HEENT: NCAT,mucous membranes moist.   Cardiovascular: S1 S2 auscultated, 2/6SEM, RRR  Respiratory: Clear to auscultation bilaterally   Abdomen: Soft, nontender, nondistended, + bowel sounds  Extremities: warm dry without cyanosis clubbing. Small callus on right great toe. +edema RLE  Neuro: AAOx3, nonfocal  Psych: Appropriate mood and affect   Data Reviewed: I have personally reviewed following labs and imaging studies  CBC:  Recent Labs Lab 02/16/16 1134 02/17/16 1131 02/18/16 1121 02/19/16 0523 02/20/16 0450  WBC 6.6 7.0 7.2 7.7 7.2  HGB 13.6 13.6 13.4 13.0 12.4*  HCT 41.7 40.8 40.6 40.2 37.6*  MCV 96.5 95.8 95.5 97.1 95.2  PLT 131* 134* 136* 135* 660   Basic Metabolic Panel:  Recent Labs Lab 02/17/16 1131 02/18/16 0340  02/18/16 1121 02/19/16 0805 02/20/16 0450  NA 136 136 137 137 135  K 3.3* 3.1* 3.3* 3.0* 3.7  CL 97* 97* 95* 98* 100*  CO2 '29 28 30 30 28  '$ GLUCOSE 98 124* 106* 120* 104*  BUN 28* 28* 27* 21* 22*  CREATININE 1.71* 1.64* 1.64* 1.32* 1.37*  CALCIUM 8.9 8.6* 8.6* 8.3* 8.3*  MG  --   --   --  2.1  --    GFR: Estimated Creatinine Clearance: 42.4 mL/min (by C-G formula based on SCr of 1.37 mg/dL (H)). Liver Function Tests: No results for input(s): AST, ALT, ALKPHOS, BILITOT, PROT, ALBUMIN in the last 168 hours. No results for input(s): LIPASE, AMYLASE in the last 168 hours. No results for input(s): AMMONIA in the last 168 hours. Coagulation Profile: No results for input(s): INR, PROTIME in the last 168 hours. Cardiac Enzymes:  Recent Labs Lab 02/17/16 1604 02/17/16 2155 02/18/16 0340  TROPONINI 0.04* 0.04* 0.04*   BNP (last 3 results) No results for input(s): PROBNP in the last 8760 hours. HbA1C:  Recent Labs  02/17/16 1456  HGBA1C 5.8*   CBG: No results for  input(s): GLUCAP in the last 168 hours. Lipid Profile:  Recent Labs  02/18/16 0340  CHOL 124  HDL 46  LDLCALC 65  TRIG 64  CHOLHDL 2.7   Thyroid Function Tests: No results for input(s): TSH, T4TOTAL, FREET4, T3FREE, THYROIDAB in the last 72 hours. Anemia Panel: No results for input(s): VITAMINB12, FOLATE, FERRITIN, TIBC, IRON, RETICCTPCT in the last 72 hours. Urine analysis:    Component Value Date/Time   COLORURINE YELLOW 11/20/2012 1123   APPEARANCEUR CLEAR 11/20/2012 1123   LABSPEC 1.004 (L) 11/20/2012 1123   PHURINE 7.0 11/20/2012 1123   GLUCOSEU NEGATIVE 11/20/2012 1123   HGBUR NEGATIVE 11/20/2012 1123   BILIRUBINUR NEGATIVE 11/20/2012 1123   KETONESUR NEGATIVE 11/20/2012 1123   PROTEINUR NEGATIVE 11/20/2012 1123   UROBILINOGEN 0.2 11/20/2012 1123   NITRITE NEGATIVE 11/20/2012 1123   LEUKOCYTESUR NEGATIVE 11/20/2012 1123   Sepsis Labs: '@LABRCNTIP'$ (procalcitonin:4,lacticidven:4)  ) Recent  Results (from the past 240 hour(s))  MRSA PCR Screening     Status: None   Collection Time: 02/18/16  3:14 AM  Result Value Ref Range Status   MRSA by PCR NEGATIVE NEGATIVE Final    Comment:        The GeneXpert MRSA Assay (FDA approved for NASAL specimens only), is one component of a comprehensive MRSA colonization surveillance program. It is not intended to diagnose MRSA infection nor to guide or monitor treatment for MRSA infections.       Radiology Studies: Dg Foot Complete Right  Result Date: 02/18/2016 CLINICAL DATA:  Right foot pain for the past 2-3 years. Clinical concern for infection. EXAM: RIGHT FOOT COMPLETE - 3+ VIEW COMPARISON:  None. FINDINGS: Mild to moderate spur formation at the first MTP joint. Small fifth metatarsal head spur. Dorsal navicular spur formation and small inferior calcaneal spur. No soft tissue swelling, soft tissue gas, bone destruction or periosteal reaction. IMPRESSION: Multifocal spur formation. No radiographic evidence of infection or osteomyelitis. Electronically Signed   By: Claudie Revering M.D.   On: 02/18/2016 15:09     Scheduled Meds: . ALPRAZolam  0.375 mg Oral QHS  . amiodarone  100 mg Oral Daily  . atorvastatin  20 mg Oral q1800  . digoxin  0.0625 mg Oral Daily  . levothyroxine  112 mcg Oral QAC breakfast  . magnesium gluconate  500 mg Oral Daily  . multivitamin with minerals  1 tablet Oral Daily  . pantoprazole  40 mg Oral Daily  . potassium chloride SA  40 mEq Oral BID  . sodium chloride flush  10-40 mL Intracatheter Q12H  . sodium chloride flush  3 mL Intravenous Q12H  . sodium chloride flush  3 mL Intravenous Q12H  . zolpidem  5 mg Oral QHS   Continuous Infusions: . sodium chloride    . heparin 900 Units/hr (02/18/16 8841)  . milrinone 0.375 mcg/kg/min (02/20/16 0011)     LOS: 2 days   Time Spent in minutes   30 minutes  Laquinta Hazell D.O. on 02/20/2016 at 10:03 AM  Between 7am to 7pm - Pager - [phone]  After  7pm go to [email or web address] - password [email or web address]  And look for the night coverage person covering for me after hours  [facility] Office  [phone]

## 2016-02-21 ENCOUNTER — Other Ambulatory Visit: Payer: Self-pay

## 2016-02-21 DIAGNOSIS — I5022 Chronic systolic (congestive) heart failure: Secondary | ICD-10-CM | POA: Diagnosis not present

## 2016-02-21 DIAGNOSIS — I5033 Acute on chronic diastolic (congestive) heart failure: Secondary | ICD-10-CM | POA: Diagnosis not present

## 2016-02-21 DIAGNOSIS — Z950 Presence of cardiac pacemaker: Secondary | ICD-10-CM | POA: Diagnosis not present

## 2016-02-21 DIAGNOSIS — N183 Chronic kidney disease, stage 3 (moderate): Secondary | ICD-10-CM | POA: Diagnosis not present

## 2016-02-21 DIAGNOSIS — I130 Hypertensive heart and chronic kidney disease with heart failure and stage 1 through stage 4 chronic kidney disease, or unspecified chronic kidney disease: Secondary | ICD-10-CM | POA: Diagnosis not present

## 2016-02-21 DIAGNOSIS — Z7901 Long term (current) use of anticoagulants: Secondary | ICD-10-CM | POA: Diagnosis not present

## 2016-02-21 DIAGNOSIS — K219 Gastro-esophageal reflux disease without esophagitis: Secondary | ICD-10-CM | POA: Diagnosis not present

## 2016-02-21 DIAGNOSIS — J449 Chronic obstructive pulmonary disease, unspecified: Secondary | ICD-10-CM | POA: Diagnosis not present

## 2016-02-21 DIAGNOSIS — Z86718 Personal history of other venous thrombosis and embolism: Secondary | ICD-10-CM | POA: Diagnosis not present

## 2016-02-21 DIAGNOSIS — I481 Persistent atrial fibrillation: Secondary | ICD-10-CM

## 2016-02-21 LAB — BASIC METABOLIC PANEL
Anion gap: 9 (ref 5–15)
BUN: 21 mg/dL — AB (ref 6–20)
CHLORIDE: 100 mmol/L — AB (ref 101–111)
CO2: 28 mmol/L (ref 22–32)
CREATININE: 1.22 mg/dL (ref 0.61–1.24)
Calcium: 8.5 mg/dL — ABNORMAL LOW (ref 8.9–10.3)
GFR calc Af Amer: >60 mL/min (ref >60)
GFR calc non Af Amer: 54 mL/min — ABNORMAL LOW (ref >60)
Glucose, Bld: 117 mg/dL — ABNORMAL HIGH (ref 65–99)
Potassium: 3.9 mmol/L (ref 3.5–5.1)
Sodium: 137 mmol/L (ref 135–145)

## 2016-02-21 LAB — CBC
HEMATOCRIT: 37 % — AB (ref 39.0–52.0)
Hemoglobin: 12 g/dL — ABNORMAL LOW (ref 13.0–17.0)
MCH: 31.2 pg (ref 26.0–34.0)
MCHC: 32.4 g/dL (ref 30.0–36.0)
MCV: 96.1 fL (ref 78.0–100.0)
Platelets: 141 10*3/uL — ABNORMAL LOW (ref 150–400)
RBC: 3.85 MIL/uL — ABNORMAL LOW (ref 4.22–5.81)
RDW: 14.4 % (ref 11.5–15.5)
WBC: 6.1 10*3/uL (ref 4.0–10.5)

## 2016-02-21 LAB — COOXEMETRY PANEL
Carboxyhemoglobin: 1.6 % — ABNORMAL HIGH (ref 0.5–1.5)
Methemoglobin: 1.2 % (ref 0.0–1.5)
O2 SAT: 66.8 %
Total hemoglobin: 11.6 g/dL — ABNORMAL LOW (ref 12.0–16.0)

## 2016-02-21 LAB — HEPARIN LEVEL (UNFRACTIONATED): HEPARIN UNFRACTIONATED: 0.2 [IU]/mL — AB (ref 0.30–0.70)

## 2016-02-21 MED ORDER — HEPARIN (PORCINE) IN NACL 100-0.45 UNIT/ML-% IJ SOLN
900.0000 [IU]/h | INTRAMUSCULAR | Status: DC
  Administered 2016-02-21: 900 [IU]/h via INTRAVENOUS
  Filled 2016-02-21 (×2): qty 250

## 2016-02-21 MED ORDER — MILRINONE LACTATE IN DEXTROSE 20-5 MG/100ML-% IV SOLN: 0.3750 ug/kg/min | INTRAVENOUS | Status: AC

## 2016-02-21 MED ORDER — APIXABAN 2.5 MG PO TABS
2.5000 mg | ORAL_TABLET | Freq: Two times a day (BID) | ORAL | Status: DC
  Administered 2016-02-21: 2.5 mg via ORAL
  Filled 2016-02-21: qty 1

## 2016-02-21 NOTE — Progress Notes (Signed)
ANTICOAGULATION CONSULT NOTE - Follow Up Consult  Pharmacy Consult for Heparin / apixaban Indication: atrial fibrillation  Allergies  Allergen Reactions  . Codeine     "tripped out on it"  . Morphine     "tripped out on it"    Patient Measurements: Height: '5\' 11"'$  (180.3 cm) Weight: 151 lb 6.4 oz (68.7 kg) IBW/kg (Calculated) : 75.3 Heparin Dosing Weight: 71.8 kg  Vital Signs: Temp: 98.3 F (36.8 C) (12/05 0725) Temp Source: Oral (12/05 0725) BP: 119/53 (12/05 0725) Pulse Rate: 74 (12/05 0725)  Labs:  Recent Labs  02/18/16 1121 02/18/16 1807 02/19/16 0523 02/19/16 0805 02/20/16 0450 02/21/16 0400  HGB 13.4  --  13.0  --  12.4* 12.0*  HCT 40.6  --  40.2  --  37.6* 37.0*  PLT 136*  --  135*  --  153 141*  APTT 75* 84* 57*  --   --   --   HEPARINUNFRC 1.00*  --   --  0.68 0.40 0.20*  CREATININE 1.64*  --   --  1.32* 1.37* 1.22    Estimated Creatinine Clearance: 46.9 mL/min (by C-G formula based on SCr of 1.22 mg/dL).   Medications:  Scheduled:  . ALPRAZolam  0.375 mg Oral QHS  . amiodarone  100 mg Oral Daily  . apixaban  2.5 mg Oral BID  . atorvastatin  20 mg Oral q1800  . digoxin  0.0625 mg Oral Daily  . furosemide  80 mg Oral Daily  . levothyroxine  112 mcg Oral QAC breakfast  . magnesium gluconate  500 mg Oral Daily  . multivitamin with minerals  1 tablet Oral Daily  . pantoprazole  40 mg Oral Daily  . potassium chloride SA  20 mEq Oral BID  . sodium chloride flush  10-40 mL Intracatheter Q12H  . sodium chloride flush  3 mL Intravenous Q12H  . sodium chloride flush  3 mL Intravenous Q12H  . zolpidem  5 mg Oral QHS   Infusions:  . sodium chloride    . milrinone 0.375 mcg/kg/min (02/21/16 0229)    Assessment: 82 yr old male admitted 12/1 with chest pain - cardiac vs musculoskeletal vs anxiety.  Cath 12/1. CO low- suspect chest pain from HF and pt started on milrinone.  Plans for tunnelled cath placement and home milrinone.  Hold Eliquis and bridge  with IV heparin until post procedure.   S/p tunneled cath placed 12/4 Heparin drip turned off  Restart apixaban this am Home dose apixaban 2.'5mg'$  BID - Age > 80, wt > 60kg, Hx Cr 1.7 but improved 1.2 with milrinone.  Discussed with MD continue Apixaban 2.'5mg'$  BID   Goal of Therapy:  HL 0.3-0.7 Monitor platelets by anticoagulation protocol: Yes   Plan:   restart apixaban 2.'5mg'$  BID  Bonnita Nasuti Pharm.D. CPP, BCPS Clinical Pharmacist [phone] 02/21/2016 10:11 AM

## 2016-02-21 NOTE — Progress Notes (Signed)
CARDIAC REHAB PHASE I   PRE:  Rate/Rhythm: 72 pacing    BP: sitting 117/59    SaO2: 100 RA  MODE:  Ambulation: 550 ft   POST:  Rate/Rhythm: 90 pacing    BP: sitting 145/76     SaO2:   Pt fairly steady with his cane. Sts he was getting SOB toward end but "I'm alright". Reviewed HF ed and walking daily. Pt likes to exercise on stationary bikes at gym, cautioned him to slowly move back in to this and listen for sx. He weighs daily. He did CRPII years ago and it was too expensive. [place], ACSM 02/21/2016 11:52 AM

## 2016-02-21 NOTE — Progress Notes (Addendum)
ANTICOAGULATION CONSULT NOTE - Follow Up Consult  Pharmacy Consult for Heparin (Eliquis on hold) Indication: atrial fibrillation  Allergies  Allergen Reactions  . Codeine     "tripped out on it"  . Morphine     "tripped out on it"    Patient Measurements: Height: '5\' 11"'$  (180.3 cm) Weight: 153 lb 9.6 oz (69.7 kg) IBW/kg (Calculated) : 75.3 Heparin Dosing Weight: 71.8 kg  Vital Signs: Temp: 97.7 F (36.5 C) (12/05 0007) Temp Source: Oral (12/05 0007) BP: 121/51 (12/05 0007) Pulse Rate: 73 (12/05 0007)  Labs:  Recent Labs  02/18/16 1121 02/18/16 1807 02/19/16 0523 02/19/16 0805 02/20/16 0450 02/21/16 0400  HGB 13.4  --  13.0  --  12.4* 12.0*  HCT 40.6  --  40.2  --  37.6* 37.0*  PLT 136*  --  135*  --  153 141*  APTT 75* 84* 57*  --   --   --   HEPARINUNFRC 1.00*  --   --  0.68 0.40 0.20*  CREATININE 1.64*  --   --  1.32* 1.37*  --     Estimated Creatinine Clearance: 42.4 mL/min (by C-G formula based on SCr of 1.37 mg/dL (H)).  Assessment: 82 yr old male admitted 12/1 with chest pain - cardiac vs musculoskeletal vs anxiety.  Cath 12/1. CO low- suspect chest pain from HF and pt started on milrinone.  Plans for tunnelled cath placement and home milrinone.  Hold Eliquis and bridge with IV heparin until post procedure. Heparin level down to subtherapeutic (0.2) - heparin gtt has been off since post IR yesterday ~1300. Post-op orders not released - I have now released and bleeding risk was low so heparin should have been restarted 4 hr post procedure.  Goal of Therapy:  HL 0.3-0.7 units/ml Monitor platelets by anticoagulation protocol: Yes   Plan:  Heparin currently off Restart heparin at 900 units/hr F/u 8hr heparin level vs restart Eliquis today  Sherlon Handing, PharmD, BCPS Clinical pharmacist, pager [phone] 02/21/2016 4:58 AM

## 2016-02-21 NOTE — Progress Notes (Signed)
Patient ID: Trevor Simpson, male   DOB: 07/02/1933, 82 y.o.   MRN: [id number]   Trevor Simpson is a 82 y.o.year old with history of COPD, symptomatic bradycardia, PAF, HTN, PAD, CVA, chronic systolic heart failure and ischemic cardiomyopathy (EF 20-25% echo 7/16) s/p CRT-D on 09/26/13.   [place] 2014  Left mainstem: The left main is long with 20% stenosis distally.  Left anterior descending (LAD): The left anterior descending artery is moderately calcified in the proximal vessel. There is diffuse disease in the proximal vessel up to 30%. The first diagonal is relatively small and has mild disease proximally. The ramus intermediate branch is a large branch that bifurcates in the mid vessel. The proximal vessel has a 90-95% stenosis.  Left circumflex (LCx): The left circumflex is relatively small. It gives rise to a single marginal branch and then continues in the AV groove. The first marginal branch has diffuse 60-70% stenosis proximally.  Right coronary artery (RCA): The right coronary is occluded proximally. There are right to right and left to right collaterals. Severe two-vessel obstructive coronary disease. Chronic total occlusion of the right coronary Successful stenting of the ramus intermediate branch with a bare-metal stent  Admitted June 2015 with low output HF. Started on short term milrinone. Milrinone was discontinued September 2015.  11/30 he was seen in the HF clinic.Due to increased fatigue and dyspnea he was set up for RHC.  12/1 he presented to the ED with increased dyspnea and chest pain.  Very fatigued.    RHC done 12/1: RA = 11 RV = 56/14 PA =  55/26 (38) PCW =  27 mean V wave to 40 Fick cardiac output/index = 2.4/1.3 PVR = 4.5 WU Ao sat = 99% PA sat = 41%, 43%  Post cath started on milrinone. Remains on milrinone 0.37 mcg. Todays CO-OX is 67%.   Denies SOB/CP  ECHO 01/19/2016 EF 25%   Scheduled Meds: . ALPRAZolam  0.375 mg Oral QHS  . amiodarone  100 mg Oral Daily    . atorvastatin  20 mg Oral q1800  . digoxin  0.0625 mg Oral Daily  . furosemide  80 mg Oral Daily  . levothyroxine  112 mcg Oral QAC breakfast  . magnesium gluconate  500 mg Oral Daily  . multivitamin with minerals  1 tablet Oral Daily  . pantoprazole  40 mg Oral Daily  . potassium chloride SA  20 mEq Oral BID  . sodium chloride flush  10-40 mL Intracatheter Q12H  . sodium chloride flush  3 mL Intravenous Q12H  . sodium chloride flush  3 mL Intravenous Q12H  . zolpidem  5 mg Oral QHS   Continuous Infusions: . sodium chloride    . heparin 900 Units/hr (02/21/16 0530)  . milrinone 0.375 mcg/kg/min (02/21/16 0229)   PRN Meds:.sodium chloride, sodium chloride, acetaminophen, fluticasone, gi cocktail, lidocaine, loratadine, ondansetron (ZOFRAN) IV, sodium chloride flush, sodium chloride flush, sodium chloride flush  Vitals:   02/20/16 1900 02/21/16 0007 02/21/16 0538 02/21/16 0725  BP: (!) 104/56 (!) 121/51 (!) 113/49 (!) 119/53  Pulse: 75 73 87 74  Resp: '18 18 18 '$ (!) 21  Temp: 97.7 F (36.5 C) 97.7 F (36.5 C) 98.9 F (37.2 C) 98.3 F (36.8 C)  TempSrc: Oral Oral Oral Oral  SpO2: 100% 100% 100% 100%  Weight:   151 lb 6.4 oz (68.7 kg)   Height:        Intake/Output Summary (Last 24 hours) at 02/21/16 0831 Last data filed at  02/21/16 0600  Gross per 24 hour  Intake           1720.5 ml  Output              650 ml  Net           1070.5 ml    LABS: Basic Metabolic Panel:  Recent Labs  02/19/16 0805 02/20/16 0450 02/21/16 0400  NA 137 135 137  K 3.0* 3.7 3.9  CL 98* 100* 100*  CO2 '30 28 28  '$ GLUCOSE 120* 104* 117*  BUN 21* 22* 21*  CREATININE 1.32* 1.37* 1.22  CALCIUM 8.3* 8.3* 8.5*  MG 2.1  --   --    Liver Function Tests: No results for input(s): AST, ALT, ALKPHOS, BILITOT, PROT, ALBUMIN in the last 72 hours. No results for input(s): LIPASE, AMYLASE in the last 72 hours. CBC:  Recent Labs  02/20/16 0450 02/21/16 0400  WBC 7.2 6.1  HGB 12.4* 12.0*  HCT  37.6* 37.0*  MCV 95.2 96.1  PLT 153 141*   Cardiac Enzymes: No results for input(s): CKTOTAL, CKMB, CKMBINDEX, TROPONINI in the last 72 hours. BNP: Invalid input(s): POCBNP D-Dimer: No results for input(s): DDIMER in the last 72 hours. Hemoglobin A1C: No results for input(s): HGBA1C in the last 72 hours. Fasting Lipid Panel: No results for input(s): CHOL, HDL, LDLCALC, TRIG, CHOLHDL, LDLDIRECT in the last 72 hours. Thyroid Function Tests: No results for input(s): TSH, T4TOTAL, T3FREE, THYROIDAB in the last 72 hours.  Invalid input(s): FREET3 Anemia Panel: No results for input(s): VITAMINB12, FOLATE, FERRITIN, TIBC, IRON, RETICCTPCT in the last 72 hours.  RADIOLOGY: Dg Chest 2 View  Result Date: 02/17/2016 CLINICAL DATA:  Chest pain EXAM: CHEST  2 VIEW COMPARISON:  10/16/2014 FINDINGS: Cardiac enlargement.  Internal defibrillator unchanged. COPD with hyperinflation. Scarring in the right upper lobe or right middle lobe anteriorly is unchanged. Small bilateral effusions have developed since the prior study. There is mild vascular congestion and prominent interstitial markings. Findings suggest mild heart failure. COPD Mild vascular congestion and small pleural effusions consistent with mild heart failure. Right upper lobe/right middle lobe scarring. IMPRESSION: No active cardiopulmonary disease. Electronically Signed   By: Franchot Gallo M.D.   On: 02/17/2016 13:21   Ir Fluoro Guide Cv Line Right  Result Date: 02/20/2016 CLINICAL DATA:  Coronary disease, heart failure, needs long-term venous access EXAM: RIGHT IJ TUNNELED CENTRAL VENOUS CATHETER PLACEMENT UNDER ULTRASOUND AND FLUOROSCOPIC GUIDANCE FLUOROSCOPY TIME:  12 seconds (10 mGy) TECHNIQUE: The procedure, risks (including but not limited to bleeding, infection, organ damage ), benefits, and alternatives were explained to the patient. Questions regarding the procedure were encouraged and answered. The patient understands and consents  to the procedure. Patency of the right IJ vein was confirmed with ultrasound with image documentation. An appropriate skin site was determined. Skin site was marked. Region was prepped using maximum barrier technique including cap and mask, sterile gown, sterile gloves, large sterile sheet, and Chlorhexidine as cutaneous antisepsis. The region was infiltrated locally with 1% lidocaine. Under real-time ultrasound guidance, the right IJ vein was accessed with a 21 gauge micropuncture needle; the needle tip within the vein was confirmed with ultrasound image documentation. The needle exchanged over a guidewire for peel-away sheath through which an 23cm 5 [place] PowerPICC dual lumen catheter which had been tunneled from a right anterior chest wall approach was advanced. This was positioned with the tip at the cavoatrial junction. Spot chest radiograph shows good positioning and no pneumothorax.  Catheter was flushed and sutured externally with 0-Prolene sutures. Patient tolerated the procedure well. COMPLICATIONS: None immediate IMPRESSION: 1. Technically successful tunneled right IJ double lumen power injectable central venous catheter placement. Electronically Signed   By: Lucrezia Europe M.D.   On: 02/20/2016 15:57   Ir US Guide Vasc Access Right  Result Date: 02/20/2016 CLINICAL DATA:  Coronary disease, heart failure, needs long-term venous access EXAM: RIGHT IJ TUNNELED CENTRAL VENOUS CATHETER PLACEMENT UNDER ULTRASOUND AND FLUOROSCOPIC GUIDANCE FLUOROSCOPY TIME:  12 seconds (10 mGy) TECHNIQUE: The procedure, risks (including but not limited to bleeding, infection, organ damage ), benefits, and alternatives were explained to the patient. Questions regarding the procedure were encouraged and answered. The patient understands and consents to the procedure. Patency of the right IJ vein was confirmed with ultrasound with image documentation. An appropriate skin site was determined. Skin site was marked. Region was  prepped using maximum barrier technique including cap and mask, sterile gown, sterile gloves, large sterile sheet, and Chlorhexidine as cutaneous antisepsis. The region was infiltrated locally with 1% lidocaine. Under real-time ultrasound guidance, the right IJ vein was accessed with a 21 gauge micropuncture needle; the needle tip within the vein was confirmed with ultrasound image documentation. The needle exchanged over a guidewire for peel-away sheath through which an 23cm 5 [place] PowerPICC dual lumen catheter which had been tunneled from a right anterior chest wall approach was advanced. This was positioned with the tip at the cavoatrial junction. Spot chest radiograph shows good positioning and no pneumothorax. Catheter was flushed and sutured externally with 0-Prolene sutures. Patient tolerated the procedure well. COMPLICATIONS: None immediate IMPRESSION: 1. Technically successful tunneled right IJ double lumen power injectable central venous catheter placement. Electronically Signed   By: Lucrezia Europe M.D.   On: 02/20/2016 15:57   Dg Chest Port 1 View  Result Date: 02/17/2016 CLINICAL DATA:  Central line placement EXAM: PORTABLE CHEST 1 VIEW COMPARISON:  02/17/2016 FINDINGS: There is cardiomegaly present. Left-sided multi lead ICD device grossly similar. Consolidation in the left lower lobe with tiny left effusion. Right-sided central venous catheter tip overlies expected location of distal SVC. Mild interstitial infiltrate present in the right upper lobe and medial right lung base. No pneumothorax. IMPRESSION: 1. Right-sided central venous catheter tip appears to overlie the distal SVC. No pneumothorax. 2. Cardiomegaly 3. Small left pleural effusion with dense left lower lobe consolidation. Additional infiltrates in the right upper lobe and right lung base. Electronically Signed   By: Donavan Foil M.D.   On: 02/17/2016 22:45   Dg Foot Complete Right  Result Date: 02/18/2016 CLINICAL DATA:  Right foot  pain for the past 2-3 years. Clinical concern for infection. EXAM: RIGHT FOOT COMPLETE - 3+ VIEW COMPARISON:  None. FINDINGS: Mild to moderate spur formation at the first MTP joint. Small fifth metatarsal head spur. Dorsal navicular spur formation and small inferior calcaneal spur. No soft tissue swelling, soft tissue gas, bone destruction or periosteal reaction. IMPRESSION: Multifocal spur formation. No radiographic evidence of infection or osteomyelitis. Electronically Signed   By: Claudie Revering M.D.   On: 02/18/2016 15:09    PHYSICAL EXAM CVP 2.  General: Elderly. Chronically ill appearing. Sitting in the chair  Neck: JVP not elevated. No thyromegaly or nodule noted.  Lungs: CTAB, normal effort CV: Nondisplaced PMI.  Heart regular S1/S2, no S3/S4, 2/6 HSM LLSB.  R upper chest tunneled catheter.  Abdomen: Soft, NT, ND, no HSM. No bruits or masses. +BS   Neurologic: Alert and oriented  x 3.  Psych: Normal affect. Extremities: No clubbing or cyanosis.   TELEMETRY:A paced V sensed.  70s   Assessment:  1. Chest Pain: Suspect not due to ACS.  2. A/C Systolic Heart Failure: Ischemic cardiomyopathy S/P CRT-D.  Low output HF by RHC this admission.   3. PAF S/P DC-CV 09/2012: Currently A-V sequential pacing. He is on amiodarone.  4. CAD s/p previous RI stent  5. CKD III  Plan/Discussion:    Suspect main issue is recurrent low output HF. CP likely due to myocardial wall stress and not ACS.  Given normal troponin and CKD will not proceed with coronary angio at this point. RHC on 12/1 suggested low output HF as suspected.   [place]ox stable now on 0.375 mcg/kg/min. CVP low. Continue lasix 80 mg daily. No bb with low output.  Continue digoxin, level ok.    Stop heparin. Start eliquis 2.5 mg twice a day.   Will need [facility] for home milrinone 0.375 mcg.   Trevor Grinder, NP 02/21/16   Advanced Heart Failure Team Pager [phone] (M-F; 7a - 4p)  Please contact [facility] for night-coverage after hours  (4p -7a ) and weekends on [email or web address]  Patient seen and examined with Trevor Grinder, NP. We discussed all aspects of the encounter. I agree with the assessment and plan as stated above.   Much improved on milrinone. Tunneled PICC in place. Renal function improving. Back on Eliquis. Plan home today with close f/u in HF Clinic.   Trevor Mahaffy,MD 10:03 AM

## 2016-02-21 NOTE — Progress Notes (Signed)
Report received in patient's room via [facility] RN using SBAR format, reviewed diagnosis, test results, VS, meds, POC, new orders and events of the day, assumed care of patient.

## 2016-02-21 NOTE — Discharge Instructions (Signed)
Heart Failure  Heart failure means your heart has trouble pumping blood. This makes it hard for your body to work well. Heart failure is usually a long-term (chronic) condition. You must take good care of yourself and follow your doctor's treatment plan.  HOME CARE   Take your heart medicine as told by your doctor.    Do not stop taking medicine unless your doctor tells you to.    Do not skip any dose of medicine.    Refill your medicines before they run out.    Take other medicines only as told by your doctor or pharmacist.   Stay active if told by your doctor. The elderly and people with severe heart failure should talk with a doctor about physical activity.   Eat heart-healthy foods. Choose foods that are without trans fat and are low in saturated fat, cholesterol, and salt (sodium). This includes fresh or frozen fruits and vegetables, fish, lean meats, fat-free or low-fat dairy foods, whole grains, and high-fiber foods. Lentils and dried peas and beans (legumes) are also good choices.   Limit salt if told by your doctor.   Cook in a healthy way. Roast, grill, broil, bake, poach, steam, or stir-fry foods.   Limit fluids as told by your doctor.   Weigh yourself every morning. Do this after you pee (urinate) and before you eat breakfast. Write down your weight to give to your doctor.   Take your blood pressure and write it down if your doctor tells you to.   Ask your doctor how to check your pulse. Check your pulse as told.   Lose weight if told by your doctor.   Stop smoking or chewing tobacco. Do not use gum or patches that help you quit without your doctor's approval.   Schedule and go to doctor visits as told.   Nonpregnant women should have no more than 1 drink a day. Men should have no more than 2 drinks a day. Talk to your doctor about drinking alcohol.   Stop illegal drug use.   Stay current with shots (immunizations).   Manage your health conditions as told by your doctor.   Learn to  manage your stress.   Rest when you are tired.   If it is really hot outside:    Avoid intense activities.    Use air conditioning or fans, or get in a cooler place.    Avoid caffeine and alcohol.    Wear loose-fitting, lightweight, and light-colored clothing.   If it is really cold outside:    Avoid intense activities.    Layer your clothing.    Wear mittens or gloves, a hat, and a scarf when going outside.    Avoid alcohol.   Learn about heart failure and get support as needed.   Get help to maintain or improve your quality of life and your ability to care for yourself as needed.  GET HELP IF:    You gain weight quickly.   You are more short of breath than usual.   You cannot do your normal activities.   You tire easily.   You cough more than normal, especially with activity.   You have any or more puffiness (swelling) in areas such as your hands, feet, ankles, or belly (abdomen).   You cannot sleep because it is hard to breathe.   You feel like your heart is beating fast (palpitations).   You get dizzy or light-headed when you stand up.  GET HELP   RIGHT AWAY IF:    You have trouble breathing.   There is a change in mental status, such as becoming less alert or not being able to focus.   You have chest pain or discomfort.   You faint.  MAKE SURE YOU:    Understand these instructions.   Will watch your condition.   Will get help right away if you are not doing well or get worse.     This information is not intended to replace advice given to you by your health care provider. Make sure you discuss any questions you have with your health care provider.     Document Released: 12/13/2007 Document Revised: 03/26/2014 Document Reviewed: 04/21/2012  Elsevier Interactive Patient Education 2017 [employer]

## 2016-02-21 NOTE — Care Management Note (Signed)
Case Management Note  Patient Details  Name: Trevor Simpson MRN: [id number] Date of Birth: 1933/12/29  Subjective/Objective:  Pt presented for Dyspnea and  Chest Pain. Pt initiated on IV Milrinone and the plan will be to discharge home once stable.    Action/Plan: Pt did offer choice for [facility]. Pt chose [facility] for [facility]. CM did make referral with Pam for IV Milrinone and she is to contact [place] with [facility]. SOC to begin within 24-48 hours post d/c. No further needs from CM @ this time.   Expected Discharge Date:                  Expected Discharge Plan:  [place]  In-House Referral:  NA  Discharge planning Services  CM Consult  Post Acute Care Choice:  Home Health, Durable Medical Equipment Choice offered to:  Patient  DME Arranged:  IV pump/equipment DME Agency:  [place]:  RN [facility] Agency:  [place]  Status of Service:  Completed, signed off  If discussed at [place] of Stay Meetings, dates discussed:    Additional Comments:  Bethena Roys, RN 02/21/2016, 10:12 AM

## 2016-02-21 NOTE — Discharge Summary (Signed)
Physician Discharge Summary  Trevor Simpson:[id number] DOB: 1933/08/03 DOA: 02/17/2016  PCP: Trevor Gravel, MD  Admit date: 02/17/2016 Discharge date: 02/21/2016  Time spent: 45 minutes  Recommendations for Outpatient Follow-up:  Patient will be discharged to home with home health.  Patient will need to follow up with primary care provider within one week of discharge, repeat BMP.  Follow up with the heart failure clinic as scheduled.  Follow up with a podiatrist.  Patient should continue medications as prescribed.  Patient should follow a heart healthy diet.   Discharge Diagnoses:  Acute on chronic systolic CHF Chest pain/ History of CAD Paroxsymal Atrial fibrillation Chronic kidney disease, Stage III Hypokalemia Anxiety COPD GERD Right toe pain  Discharge Condition: Stable  Diet recommendation: heart healthy  Filed Weights   02/19/16 0500 02/20/16 0449 02/21/16 0538  Weight: 68.4 kg (150 lb 12.8 oz) 69.7 kg (153 lb 9.6 oz) 68.7 kg (151 lb 6.4 oz)    History of present illness:  on 02/17/2016 by Ms. Trevor Butters, PA Trevor Simpson a 82 y.o.malewith extensive cardiac medical history including atrial fibrillation, history of pacemaker placement, history of PE, on Eliquis, as well as a history of CVA, history of systolic heart failure and ICM, with EF 20 to 25%, and a history of COPD, presenting with acute onset of central, substernal chest pain, starting this morning, as he is wife was preparing for our cardiac catheterization. He reports that he has been experiencing waxing and waning symptoms over the last few weeks, and has been seen by his cardiologist regarding this issue. In fact, he is scheduled to undergo a right heart catheterization and 02/20/2016. He denies any radiation to the jaw or left arm. The pain is now resolve after a 2nd nitroglycerin in an aspirin. The pain is not worsened with deep inspiration, movement or exertion. He has been increasingly fatiguedover  the last few weeks. He reports having good and bad days regarding his fatigue and shortness of breath. He denies any dizziness or falls. No presyncope or syncope. He denies any cough, fever chills night sweats, or sick contacts. He denies any vomiting or abdominal pain. Appetite is normal. He denies any leg swelling or calf pain. He denies any headaches or vision changes. No seizures or confusion are reported.No recent long distance trips. He has admitted to significant stress regarding his wife cardiac condition.No new meds. Not on hormonal therapy. No new herbal supplements. Does not smoke or intake significant amount ofETOH. Norecreational drugs.His last cardiac catheterization was in June 2014 and is followed by Trevor Simpson. During his visit on 02/16/2016, his cardiologist also recommended that the patient reinforces fluid restriction to less than 2 L a day, with sodium restriction as well to less than 2 g, and he admits to have been abiding to this.  Hospital Course:  Acute on chronic systolic CHF -Echocardiogram 01/19/2016 showed an EF of 25%, restrictive diastolic function -Upon admission, chest x-ray did show mild vascular congestion with small pleural effusions -BNP 2316 -Cardiology consulted and appreciated -s/p catheterization showing elevated biventricular pressures was severely depressed cardiac output consistent with cardiogenic shock physiology. -Cardiology did start milrinone with IV diuresis -Monitor intake/output, daily weights -UOP over past 24hrs 1450cc -IV lasix discontinued 12/3. Plan for PO lasix today (per cardiology note) -Placement of tunneled R IJ CVC -Plan to discharge patient with milrinone 0.328mg/min, Home health, lasix PO '80mg'$  daily, digoxin, Eliquis  Chest pain/ History of CAD -s/p RI stent  -Troponins cycled, remained flat.  Unlikely ACS -Suspect chest pain due to musculoskeletal cause.  Currently chest pain free. -Continue Eliquis, statin  Paroxsymal  Atrial fibrillation -CHADSVASC 7 -Continue amiodarone, digoxin -Continue Eliquis  Chronic kidney disease, Stage III -Baseline creatinine 1.6. Creatinine was 1.71 upon admission, currently 1.22 -Continue to monitor BMP  Hypokalemia -Improving K 3.9, continue to monitor BMP -Continue potassium supplementation  -Likely secondary to diuresis -Magnesium level 2.1 -Repeat BMP in one week  Anxiety -Continue Xanax  COPD -Currently appears compensated, continue to monitor closely  GERD -Continue PPI  Right toe pain -likely secondary to callus.  -Xray showed multifocal spur formation. No infection or osteo. -patient should follow up with a podiatrist upon discharge  Consultants Cardiology  Procedures  Right heart catheterization Placement of RIJ CVC   Discharge Exam: Vitals:   02/21/16 0538 02/21/16 0725  BP: (!) 113/49 (!) 119/53  Pulse: 87 74  Resp: 18 (!) 21  Temp: 98.9 F (37.2 C) 98.3 F (36.8 C)   Feeling better today and ready to go home.  Denies chest pain, feels breathing has improved. Denies abdominal pain, nausea, vomiting, nausea, vomiting, headache, dizziness.    Exam  General: Well developed, well nourished, elderly male, no distress  HEENT: NCAT,mucous membranes moist. Poor dentition  Cardiovascular: S1 S2 auscultated, 2/6SEM, RRR  Respiratory: Clear to auscultation bilaterally   Abdomen: Soft, nontender, nondistended, + bowel sounds  Extremities: warm dry without cyanosis clubbing.   Neuro: AAOx3, nonfocal  Psych: Appropriate mood and affect, pleasant  Discharge Instructions Discharge Instructions    Discharge instructions    Complete by:  As directed    Patient will be discharged to home with home health.  Patient will need to follow up with primary care provider within one week of discharge, repeat BMP.  Follow up with the heart failure clinic as scheduled.  Follow up with a podiatrist.  Patient should continue medications as  prescribed.  Patient should follow a heart healthy diet.     Current Discharge Medication List    START taking these medications   Details  milrinone (PRIMACOR) 20 MG/100 ML SOLN infusion Inject 26.775 mcg/min into the vein continuous.      CONTINUE these medications which have NOT CHANGED   Details  ALPRAZolam (XANAX) 0.25 MG tablet Take 0.375 mg by mouth at bedtime.    amiodarone (PACERONE) 200 MG tablet Take 0.5 tablets (100 mg total) by mouth daily. Qty: 45 tablet, Refills: 3    apixaban (ELIQUIS) 2.5 MG TABS tablet Take 1 tablet (2.5 mg total) by mouth 2 (two) times daily. Qty: 60 tablet, Refills: 3    atorvastatin (LIPITOR) 20 MG tablet Take 20 mg by mouth daily at 6 PM.     B Complex-C (SUPER B COMPLEX) TABS Take 1 tablet by mouth daily.     calcium carbonate (OS-CAL) 600 MG TABS Take 600 mg by mouth daily with breakfast.     cetirizine (ZYRTEC) 10 MG tablet Take 10 mg by mouth daily.     digoxin (LANOXIN) 0.125 MG tablet Take 0.5 tablets (0.0625 mg total) by mouth daily. Qty: 90 tablet, Refills: 3    doxylamine, Sleep, (UNISOM) 25 MG tablet Take 25 mg by mouth at bedtime.    fluticasone (FLONASE) 50 MCG/ACT nasal spray Place 1 spray into both nostrils daily as needed for allergies. Reported on 04/11/2015    furosemide (LASIX) 80 MG tablet TAKE 1 TABLET BY MOUTH EVERY DAY Qty: 90 tablet, Refills: 3    levothyroxine (SYNTHROID, LEVOTHROID) 112  MCG tablet Take 112 mcg by mouth See admin instructions. Pt takes Monday- Saturday - skips on Sundays    magnesium gluconate (MAGONATE) 500 MG tablet Take 500 mg by mouth daily.     Multiple Vitamin (MULTIVITAMIN WITH MINERALS) TABS Take 1 tablet by mouth daily.    nitroGLYCERIN (NITROSTAT) 0.4 MG SL tablet Place 1 tablet (0.4 mg total) under the tongue every 5 (five) minutes as needed for chest pain. Qty: 25 tablet, Refills: 3   Associated Diagnoses: Cardiomyopathy, ischemic    Omega-3 Fatty Acids (FISH OIL) 1200 MG CAPS  Take 1,200 mg by mouth daily.     omeprazole (PRILOSEC) 20 MG capsule Take 20 mg by mouth daily as needed (indigestion).     potassium chloride SA (K-DUR,KLOR-CON) 20 MEQ tablet Take 1 tablet (20 mEq total) by mouth 2 (two) times daily. Qty: 180 tablet, Refills: 3    Saw Palmetto, Serenoa repens, (SAW PALMETTO PO) Take 1 capsule by mouth 2 (two) times daily.    vitamin C (ASCORBIC ACID) 500 MG tablet Take 500 mg by mouth daily.        STOP taking these medications     carvedilol (COREG) 3.125 MG tablet        Allergies  Allergen Reactions  . Codeine     "tripped out on it"  . Morphine     "tripped out on it"   Follow-up Information    Bensimhon, Quillian Quince, MD. Call.   Specialty:  Cardiology Contact information: [address] [place] [address] [phone]        Trevor Gravel, MD. Schedule an appointment as soon as possible for a visit in 1 week(s).   Specialty:  Internal Medicine Why:  Hospital follow up Contact information: [address] [place] [address] [phone]            The results of significant diagnostics from this hospitalization (including imaging, microbiology, ancillary and laboratory) are listed below for reference.    Significant Diagnostic Studies: Dg Chest 2 View  Result Date: 02/17/2016 CLINICAL DATA:  Chest pain EXAM: CHEST  2 VIEW COMPARISON:  10/16/2014 FINDINGS: Cardiac enlargement.  Internal defibrillator unchanged. COPD with hyperinflation. Scarring in the right upper lobe or right middle lobe anteriorly is unchanged. Small bilateral effusions have developed since the prior study. There is mild vascular congestion and prominent interstitial markings. Findings suggest mild heart failure. COPD Mild vascular congestion and small pleural effusions consistent with mild heart failure. Right upper lobe/right middle lobe scarring. IMPRESSION: No active cardiopulmonary disease. Electronically Signed   By:  Franchot Gallo M.D.   On: 02/17/2016 13:21   Ir Fluoro Guide Cv Line Right  Result Date: 02/20/2016 CLINICAL DATA:  Coronary disease, heart failure, needs long-term venous access EXAM: RIGHT IJ TUNNELED CENTRAL VENOUS CATHETER PLACEMENT UNDER ULTRASOUND AND FLUOROSCOPIC GUIDANCE FLUOROSCOPY TIME:  12 seconds (10 mGy) TECHNIQUE: The procedure, risks (including but not limited to bleeding, infection, organ damage ), benefits, and alternatives were explained to the patient. Questions regarding the procedure were encouraged and answered. The patient understands and consents to the procedure. Patency of the right IJ vein was confirmed with ultrasound with image documentation. An appropriate skin site was determined. Skin site was marked. Region was prepped using maximum barrier technique including cap and mask, sterile gown, sterile gloves, large sterile sheet, and Chlorhexidine as cutaneous antisepsis. The region was infiltrated locally with 1% lidocaine. Under real-time ultrasound guidance, the right IJ vein was accessed with  a 21 gauge micropuncture needle; the needle tip within the vein was confirmed with ultrasound image documentation. The needle exchanged over a guidewire for peel-away sheath through which an 23cm 5 [place] PowerPICC dual lumen catheter which had been tunneled from a right anterior chest wall approach was advanced. This was positioned with the tip at the cavoatrial junction. Spot chest radiograph shows good positioning and no pneumothorax. Catheter was flushed and sutured externally with 0-Prolene sutures. Patient tolerated the procedure well. COMPLICATIONS: None immediate IMPRESSION: 1. Technically successful tunneled right IJ double lumen power injectable central venous catheter placement. Electronically Signed   By: Lucrezia Europe M.D.   On: 02/20/2016 15:57   Ir US Guide Vasc Access Right  Result Date: 02/20/2016 CLINICAL DATA:  Coronary disease, heart failure, needs long-term venous access  EXAM: RIGHT IJ TUNNELED CENTRAL VENOUS CATHETER PLACEMENT UNDER ULTRASOUND AND FLUOROSCOPIC GUIDANCE FLUOROSCOPY TIME:  12 seconds (10 mGy) TECHNIQUE: The procedure, risks (including but not limited to bleeding, infection, organ damage ), benefits, and alternatives were explained to the patient. Questions regarding the procedure were encouraged and answered. The patient understands and consents to the procedure. Patency of the right IJ vein was confirmed with ultrasound with image documentation. An appropriate skin site was determined. Skin site was marked. Region was prepped using maximum barrier technique including cap and mask, sterile gown, sterile gloves, large sterile sheet, and Chlorhexidine as cutaneous antisepsis. The region was infiltrated locally with 1% lidocaine. Under real-time ultrasound guidance, the right IJ vein was accessed with a 21 gauge micropuncture needle; the needle tip within the vein was confirmed with ultrasound image documentation. The needle exchanged over a guidewire for peel-away sheath through which an 23cm 5 [place] PowerPICC dual lumen catheter which had been tunneled from a right anterior chest wall approach was advanced. This was positioned with the tip at the cavoatrial junction. Spot chest radiograph shows good positioning and no pneumothorax. Catheter was flushed and sutured externally with 0-Prolene sutures. Patient tolerated the procedure well. COMPLICATIONS: None immediate IMPRESSION: 1. Technically successful tunneled right IJ double lumen power injectable central venous catheter placement. Electronically Signed   By: Lucrezia Europe M.D.   On: 02/20/2016 15:57   Dg Chest Port 1 View  Result Date: 02/17/2016 CLINICAL DATA:  Central line placement EXAM: PORTABLE CHEST 1 VIEW COMPARISON:  02/17/2016 FINDINGS: There is cardiomegaly present. Left-sided multi lead ICD device grossly similar. Consolidation in the left lower lobe with tiny left effusion. Right-sided central venous  catheter tip overlies expected location of distal SVC. Mild interstitial infiltrate present in the right upper lobe and medial right lung base. No pneumothorax. IMPRESSION: 1. Right-sided central venous catheter tip appears to overlie the distal SVC. No pneumothorax. 2. Cardiomegaly 3. Small left pleural effusion with dense left lower lobe consolidation. Additional infiltrates in the right upper lobe and right lung base. Electronically Signed   By: Donavan Foil M.D.   On: 02/17/2016 22:45   Dg Foot Complete Right  Result Date: 02/18/2016 CLINICAL DATA:  Right foot pain for the past 2-3 years. Clinical concern for infection. EXAM: RIGHT FOOT COMPLETE - 3+ VIEW COMPARISON:  None. FINDINGS: Mild to moderate spur formation at the first MTP joint. Small fifth metatarsal head spur. Dorsal navicular spur formation and small inferior calcaneal spur. No soft tissue swelling, soft tissue gas, bone destruction or periosteal reaction. IMPRESSION: Multifocal spur formation. No radiographic evidence of infection or osteomyelitis. Electronically Signed   By: Claudie Revering M.D.   On: 02/18/2016 15:09  Microbiology: Recent Results (from the past 240 hour(s))  MRSA PCR Screening     Status: None   Collection Time: 02/18/16  3:14 AM  Result Value Ref Range Status   MRSA by PCR NEGATIVE NEGATIVE Final    Comment:        The GeneXpert MRSA Assay (FDA approved for NASAL specimens only), is one component of a comprehensive MRSA colonization surveillance program. It is not intended to diagnose MRSA infection nor to guide or monitor treatment for MRSA infections.      Labs: Basic Metabolic Panel:  Recent Labs Lab 02/18/16 0340 02/18/16 1121 02/19/16 0805 02/20/16 0450 02/21/16 0400  NA 136 137 137 135 137  K 3.1* 3.3* 3.0* 3.7 3.9  CL 97* 95* 98* 100* 100*  CO2 '28 30 30 28 28  '$ GLUCOSE 124* 106* 120* 104* 117*  BUN 28* 27* 21* 22* 21*  CREATININE 1.64* 1.64* 1.32* 1.37* 1.22  CALCIUM 8.6* 8.6*  8.3* 8.3* 8.5*  MG  --   --  2.1  --   --    Liver Function Tests: No results for input(s): AST, ALT, ALKPHOS, BILITOT, PROT, ALBUMIN in the last 168 hours. No results for input(s): LIPASE, AMYLASE in the last 168 hours. No results for input(s): AMMONIA in the last 168 hours. CBC:  Recent Labs Lab 02/17/16 1131 02/18/16 1121 02/19/16 0523 02/20/16 0450 02/21/16 0400  WBC 7.0 7.2 7.7 7.2 6.1  HGB 13.6 13.4 13.0 12.4* 12.0*  HCT 40.8 40.6 40.2 37.6* 37.0*  MCV 95.8 95.5 97.1 95.2 96.1  PLT 134* 136* 135* 153 141*   Cardiac Enzymes:  Recent Labs Lab 02/17/16 1604 02/17/16 2155 02/18/16 0340  TROPONINI 0.04* 0.04* 0.04*   BNP: BNP (last 3 results)  Recent Labs  02/16/16 1134  BNP 2,316.0*    ProBNP (last 3 results) No results for input(s): PROBNP in the last 8760 hours.  CBG: No results for input(s): GLUCAP in the last 168 hours.     SignedCristal Ford  [facility] 02/21/2016, 9:59 AM

## 2016-02-21 NOTE — Progress Notes (Signed)
Pt discharged to home with milrinone drip, home health rep has educated pt and family about care of site, portable equipment and medication, pt and family verbalized understanding, pts condition is stable, accompanied by family members discharge instructions complete.   Edward Qualia RN

## 2016-02-22 DIAGNOSIS — Z452 Encounter for adjustment and management of vascular access device: Secondary | ICD-10-CM | POA: Diagnosis not present

## 2016-02-22 DIAGNOSIS — I6529 Occlusion and stenosis of unspecified carotid artery: Secondary | ICD-10-CM | POA: Diagnosis not present

## 2016-02-22 DIAGNOSIS — Z86718 Personal history of other venous thrombosis and embolism: Secondary | ICD-10-CM | POA: Diagnosis not present

## 2016-02-22 DIAGNOSIS — Z950 Presence of cardiac pacemaker: Secondary | ICD-10-CM | POA: Diagnosis not present

## 2016-02-22 DIAGNOSIS — I480 Paroxysmal atrial fibrillation: Secondary | ICD-10-CM | POA: Diagnosis not present

## 2016-02-22 DIAGNOSIS — J449 Chronic obstructive pulmonary disease, unspecified: Secondary | ICD-10-CM | POA: Diagnosis not present

## 2016-02-22 DIAGNOSIS — I130 Hypertensive heart and chronic kidney disease with heart failure and stage 1 through stage 4 chronic kidney disease, or unspecified chronic kidney disease: Secondary | ICD-10-CM | POA: Diagnosis not present

## 2016-02-22 DIAGNOSIS — K219 Gastro-esophageal reflux disease without esophagitis: Secondary | ICD-10-CM | POA: Diagnosis not present

## 2016-02-22 DIAGNOSIS — N183 Chronic kidney disease, stage 3 (moderate): Secondary | ICD-10-CM | POA: Diagnosis not present

## 2016-02-22 DIAGNOSIS — R69 Illness, unspecified: Secondary | ICD-10-CM | POA: Diagnosis not present

## 2016-02-22 DIAGNOSIS — I2510 Atherosclerotic heart disease of native coronary artery without angina pectoris: Secondary | ICD-10-CM | POA: Diagnosis not present

## 2016-02-22 DIAGNOSIS — Z7901 Long term (current) use of anticoagulants: Secondary | ICD-10-CM | POA: Diagnosis not present

## 2016-02-22 DIAGNOSIS — I5033 Acute on chronic diastolic (congestive) heart failure: Secondary | ICD-10-CM | POA: Diagnosis not present

## 2016-02-23 ENCOUNTER — Telehealth: Payer: Self-pay | Admitting: Cardiology

## 2016-02-23 NOTE — Telephone Encounter (Signed)
Spoke w/ pt and requested that he send a manual transmission b/c his home monitor has not updated in at least 7 days.   

## 2016-02-25 DIAGNOSIS — Z7901 Long term (current) use of anticoagulants: Secondary | ICD-10-CM | POA: Diagnosis not present

## 2016-02-25 DIAGNOSIS — I5033 Acute on chronic diastolic (congestive) heart failure: Secondary | ICD-10-CM | POA: Diagnosis not present

## 2016-02-25 DIAGNOSIS — N183 Chronic kidney disease, stage 3 (moderate): Secondary | ICD-10-CM | POA: Diagnosis not present

## 2016-02-25 DIAGNOSIS — Z86718 Personal history of other venous thrombosis and embolism: Secondary | ICD-10-CM | POA: Diagnosis not present

## 2016-02-25 DIAGNOSIS — I130 Hypertensive heart and chronic kidney disease with heart failure and stage 1 through stage 4 chronic kidney disease, or unspecified chronic kidney disease: Secondary | ICD-10-CM | POA: Diagnosis not present

## 2016-02-25 DIAGNOSIS — K219 Gastro-esophageal reflux disease without esophagitis: Secondary | ICD-10-CM | POA: Diagnosis not present

## 2016-02-25 DIAGNOSIS — Z950 Presence of cardiac pacemaker: Secondary | ICD-10-CM | POA: Diagnosis not present

## 2016-02-27 ENCOUNTER — Other Ambulatory Visit: Payer: Self-pay | Admitting: Internal Medicine

## 2016-02-29 DIAGNOSIS — Z7901 Long term (current) use of anticoagulants: Secondary | ICD-10-CM | POA: Diagnosis not present

## 2016-02-29 DIAGNOSIS — I5021 Acute systolic (congestive) heart failure: Secondary | ICD-10-CM | POA: Diagnosis not present

## 2016-02-29 DIAGNOSIS — N183 Chronic kidney disease, stage 3 (moderate): Secondary | ICD-10-CM | POA: Diagnosis not present

## 2016-02-29 DIAGNOSIS — Z86718 Personal history of other venous thrombosis and embolism: Secondary | ICD-10-CM | POA: Diagnosis not present

## 2016-02-29 DIAGNOSIS — I5022 Chronic systolic (congestive) heart failure: Secondary | ICD-10-CM | POA: Diagnosis not present

## 2016-02-29 DIAGNOSIS — Z950 Presence of cardiac pacemaker: Secondary | ICD-10-CM | POA: Diagnosis not present

## 2016-02-29 DIAGNOSIS — I5033 Acute on chronic diastolic (congestive) heart failure: Secondary | ICD-10-CM | POA: Diagnosis not present

## 2016-02-29 DIAGNOSIS — K219 Gastro-esophageal reflux disease without esophagitis: Secondary | ICD-10-CM | POA: Diagnosis not present

## 2016-02-29 DIAGNOSIS — I130 Hypertensive heart and chronic kidney disease with heart failure and stage 1 through stage 4 chronic kidney disease, or unspecified chronic kidney disease: Secondary | ICD-10-CM | POA: Diagnosis not present

## 2016-03-01 ENCOUNTER — Ambulatory Visit (HOSPITAL_COMMUNITY)
Admission: RE | Admit: 2016-03-01 | Discharge: 2016-03-01 | Disposition: A | Discharge status: Home / Self Care | Payer: Medicare HMO | Source: Ambulatory Visit | Attending: Cardiology | Admitting: Cardiology

## 2016-03-01 ENCOUNTER — Encounter (HOSPITAL_COMMUNITY): Payer: Self-pay

## 2016-03-01 VITALS — BP 118/62 | HR 72 | Wt 157.0 lb

## 2016-03-01 DIAGNOSIS — I130 Hypertensive heart and chronic kidney disease with heart failure and stage 1 through stage 4 chronic kidney disease, or unspecified chronic kidney disease: Secondary | ICD-10-CM | POA: Diagnosis not present

## 2016-03-01 DIAGNOSIS — I2510 Atherosclerotic heart disease of native coronary artery without angina pectoris: Secondary | ICD-10-CM | POA: Diagnosis not present

## 2016-03-01 DIAGNOSIS — Z7901 Long term (current) use of anticoagulants: Secondary | ICD-10-CM | POA: Diagnosis not present

## 2016-03-01 DIAGNOSIS — I6529 Occlusion and stenosis of unspecified carotid artery: Secondary | ICD-10-CM

## 2016-03-01 DIAGNOSIS — I5023 Acute on chronic systolic (congestive) heart failure: Secondary | ICD-10-CM | POA: Diagnosis not present

## 2016-03-01 DIAGNOSIS — I959 Hypotension, unspecified: Secondary | ICD-10-CM | POA: Diagnosis not present

## 2016-03-01 DIAGNOSIS — N183 Chronic kidney disease, stage 3 unspecified: Secondary | ICD-10-CM

## 2016-03-01 DIAGNOSIS — I480 Paroxysmal atrial fibrillation: Secondary | ICD-10-CM | POA: Insufficient documentation

## 2016-03-01 DIAGNOSIS — Z8673 Personal history of transient ischemic attack (TIA), and cerebral infarction without residual deficits: Secondary | ICD-10-CM | POA: Insufficient documentation

## 2016-03-01 DIAGNOSIS — I5022 Chronic systolic (congestive) heart failure: Secondary | ICD-10-CM | POA: Diagnosis not present

## 2016-03-01 DIAGNOSIS — J449 Chronic obstructive pulmonary disease, unspecified: Secondary | ICD-10-CM | POA: Diagnosis not present

## 2016-03-01 DIAGNOSIS — I255 Ischemic cardiomyopathy: Secondary | ICD-10-CM | POA: Diagnosis not present

## 2016-03-01 DIAGNOSIS — R001 Bradycardia, unspecified: Secondary | ICD-10-CM | POA: Diagnosis not present

## 2016-03-01 DIAGNOSIS — Z79899 Other long term (current) drug therapy: Secondary | ICD-10-CM | POA: Diagnosis not present

## 2016-03-01 DIAGNOSIS — Z95810 Presence of automatic (implantable) cardiac defibrillator: Secondary | ICD-10-CM

## 2016-03-01 LAB — CBC
HCT: 40.9 % (ref 39.0–52.0)
HEMOGLOBIN: 13.3 g/dL (ref 13.0–17.0)
MCH: 31.3 pg (ref 26.0–34.0)
MCHC: 32.5 g/dL (ref 30.0–36.0)
MCV: 96.2 fL (ref 78.0–100.0)
Platelets: 174 10*3/uL (ref 150–400)
RBC: 4.25 MIL/uL (ref 4.22–5.81)
RDW: 14.2 % (ref 11.5–15.5)
WBC: 5.9 10*3/uL (ref 4.0–10.5)

## 2016-03-01 LAB — BASIC METABOLIC PANEL
ANION GAP: 10 (ref 5–15)
BUN: 21 mg/dL — ABNORMAL HIGH (ref 6–20)
CALCIUM: 8.8 mg/dL — AB (ref 8.9–10.3)
CO2: 27 mmol/L (ref 22–32)
Chloride: 101 mmol/L (ref 101–111)
Creatinine, Ser: 1.55 mg/dL — ABNORMAL HIGH (ref 0.61–1.24)
GFR, EST AFRICAN AMERICAN: 47 mL/min — AB (ref >60)
GFR, EST NON AFRICAN AMERICAN: 41 mL/min — AB (ref >60)
Glucose, Bld: 87 mg/dL (ref 65–99)
Potassium: 3.5 mmol/L (ref 3.5–5.1)
Sodium: 138 mmol/L (ref 135–145)

## 2016-03-01 LAB — BRAIN NATRIURETIC PEPTIDE: B NATRIURETIC PEPTIDE 5: 1300.7 pg/mL — AB (ref 0.0–100.0)

## 2016-03-01 NOTE — Progress Notes (Signed)
Patient ID: RUEBEN KASSIM, male   DOB: [birth year], 83 y.o.   MRN: [id number]    Advanced Heart Failure Clinic Note   PCP: Dr Maudie Mercury Pulmonology: Dr Chase Caller Cardiology: Dr Lovena Le  HPI: Mr Goodell is a 82 y.o. year old with history of  COPD, symptomatic bradycardia, PAF, HTN, PAD, CVA, chronic systolic heart failure and ischemic cardiomyopathy (EF 20-25% echo 7/16)  s/p CRT-D on 09/26/13  Admitted in 6/15 with low-output. RHC as below. Started on milrinone. In July 2015 underwent CRT-D upgrade Mean RA 11 PA 53/24  Mean PCWP 22 CI 1.5  Echo (6/15): EF 15%, moderate central MR, mild to moderately decreased RV systolic function.   Milrinone stopped in September 2015 due to patient preference, despite borderline co-ox.  Admitted 12/1 -> 02/21/16 with CP and profound fatigue. Had recently been scheduled for RHC, but came to ED with worsening symptoms. RHC with depressed cardiac output. Started on milrinone. Titrated up to 0.375 mcg with continued low mixed venous sat.  Improved with diureses and milrinone. Coox 67% on discharge.   He presents today for post hospital follow up. Feeling much better since getting out of the hospital on milrinone. Not as fatigued, able to drive. Wife says breathing is about the same. Not as SOB bathing or putting on his clothes. Not exercising yet. Hasn't been outside because of the weather. Went to [place] last night and walked around both without difficulty. More steady on his feet per wife.  HR 100-105 when he wakes up in the morning. BPs 100-110s.  Corevue: Fluid status at threshold. AF burden 3.9%. No VT/VF.   RHC 12/1: RA = 11 RV = 56/14 PA = 55/26 (38) PCW = 27 mean V wave to 40 Fick cardiac output/index = 2.4/1.3 PVR = 4.5 WU Ao sat = 99% PA sat = 41%, 43%  Labs:  7/14 AST 27 ALT 21  11/14 K 3.8 Creatinine  1.41  12/14 Pro BNP 1100 4/14: K 3.9, creatinine 1.4, BUN 22 6/15: K 4.1, creatinine 1.2 7/15: K 3.7, creatinine 1.36, HCT  43.7, co-ox 66.5% 8/15: digoxin 1.2, co-ox 55%, K 4.0, creatinine 1.36 9/15: co-ox 56.8% 11/15 K 3.8, creatinine 1.25 10/17/14: K 3.3 creatinine 1.34   SH: Lives at home with wife  FH: Father, Brother - CAD HTN, DM        Sister - lung cancer    ROS: All systems reviewed and negative except as per HPI.   Current Outpatient Prescriptions  Medication Sig Dispense Refill  . ALPRAZolam (XANAX) 0.25 MG tablet Take 0.375 mg by mouth at bedtime.    Marland Kitchen amiodarone (PACERONE) 200 MG tablet Take 0.5 tablets (100 mg total) by mouth daily. 45 tablet 3  . apixaban (ELIQUIS) 2.5 MG TABS tablet Take 1 tablet (2.5 mg total) by mouth 2 (two) times daily. 60 tablet 3  . atorvastatin (LIPITOR) 20 MG tablet Take 20 mg by mouth daily at 6 PM.     . B Complex-C (SUPER B COMPLEX) TABS Take 1 tablet by mouth daily.     . calcium carbonate (OS-CAL) 600 MG TABS Take 600 mg by mouth daily with breakfast.     . cetirizine (ZYRTEC) 10 MG tablet Take 10 mg by mouth daily.     . digoxin (LANOXIN) 0.125 MG tablet Take 0.5 tablets (0.0625 mg total) by mouth daily. 90 tablet 3  . doxylamine, Sleep, (UNISOM) 25 MG tablet Take 25 mg by mouth at bedtime.    Marland Kitchen  fluticasone (FLONASE) 50 MCG/ACT nasal spray Place 1 spray into both nostrils daily as needed for allergies. Reported on 04/11/2015    . furosemide (LASIX) 80 MG tablet TAKE 1 TABLET BY MOUTH EVERY DAY 90 tablet 3  . levothyroxine (SYNTHROID, LEVOTHROID) 112 MCG tablet Take 112 mcg by mouth See admin instructions. Pt takes Monday- Saturday - skips on Sundays    . magnesium gluconate (MAGONATE) 500 MG tablet Take 500 mg by mouth daily.     . milrinone (PRIMACOR) 20 MG/100 ML SOLN infusion Inject 26.775 mcg/min into the vein continuous.    . Multiple Vitamin (MULTIVITAMIN WITH MINERALS) TABS Take 1 tablet by mouth daily.    . nitroGLYCERIN (NITROSTAT) 0.4 MG SL tablet Place 1 tablet (0.4 mg total) under the tongue every 5 (five) minutes as needed for chest pain. 25 tablet 3   . Omega-3 Fatty Acids (FISH OIL) 1200 MG CAPS Take 1,200 mg by mouth daily.     Marland Kitchen omeprazole (PRILOSEC) 20 MG capsule Take 20 mg by mouth daily as needed (indigestion).     . potassium chloride SA (K-DUR,KLOR-CON) 20 MEQ tablet Take 1 tablet (20 mEq total) by mouth 2 (two) times daily. 180 tablet 3  . Saw Palmetto, Serenoa repens, (SAW PALMETTO PO) Take 1 capsule by mouth 2 (two) times daily.    . vitamin C (ASCORBIC ACID) 500 MG tablet Take 500 mg by mouth daily.       No current facility-administered medications for this encounter.      Allergies  Allergen Reactions  . Codeine     "tripped out on it"  . Morphine     "tripped out on it"    Vitals:   03/01/16 1057  BP: 118/62  Pulse: 72  SpO2: 97%  Weight: 157 lb (71.2 kg)   Wt Readings from Last 3 Encounters:  03/01/16 157 lb (71.2 kg)  02/21/16 151 lb 6.4 oz (68.7 kg)  02/16/16 158 lb (71.7 kg)     PHYSICAL EXAM: General:  Chronically ill and fatigued appearing.  HEENT: Normal  Neck: supple. JVP does not appear elevated. Carotids 2+ bilat; no bruits. No thyromegaly or nodule noted.  Cor: Distant. Regular. 2/6 HSM apex. +S3  RIJ tunneled PICC. Very mild redness around stitch.  Lungs: Clear, normal effort.   Abdomen: soft, NT, ND, no HSM. No bruits or masses. +BS  Extremities: no cyanosis, clubbing, rash, no peripheral edema. Warm to the touch.  Neuro: alert & oriented x 3, cranial nerves grossly intact. moves all 4 extremities w/o difficulty. Affect pleasant.   ASSESSMENT & PLAN: 1. Chronic Systolic Heart Failure: Ischemic cardiomyopathy s/p CRT-D; Echo 6/15 with EF 15%, moderate MR, mild to moderately decreased RV systolic function. Echo 7/16 EF 20-25% mild MR/AI. RV mild dysfunction  RHC in 6/15 showed CI 1.5 so patient was started on milrinone.  Milrinone stopped in 11/2013.  Improved after CRT optimization - Chronic NYHA IIIb symptoms.  - Volume status stable by exam and Corvue.   - Continue milrinone 0.375  mcg/kg/min for palliation. Pt is not a candidate for advanced therapies given his age and co-morbidities.  - Echo 01/19/16 LVEF 25%, RV mod reduced - Off BB with low output.  - Does not tolerate addition of ARB with hypotension. No ivabradine with HR < 70.  - Reinforced fluid restriction to < 2 L daily, sodium restriction to less than 2000 mg daily, and the importance of daily weights.   2. PAF: S/P DC-CV 09/2012.   -  Stable on low-dose amiodarone per Dr. Lovena Le. - has 3.9% AF burden on ICD interrogation today.  - Continue Eliquis 2.5 BID.  - No bleeding on eliquis.   - Recent LFTs and TFTs stable.  3. CAD: Extensive disease burden.  - No current signs/sx of ischemia. No CP - Continue statin. Lipids per PCP.   - No aspirin given stable CAD and use of Eliquis 4. CKD stage III - Repeat BMET today.  5. Hypotension - Improved today on inotrope support.   BMET, BNP, CBC. 4 weeks on PA side. He is stable, although tenuous.  Family understands there is nothing to offer him past milrinone, and prognosis is highly individualized.  Mr. Rehfeld has stated he wishes to stay on milrinone this time "as long as it is helping".  They plan to meet as a family over [place] and arrange his Healthcare POA and Advanced directives.   Shirley Friar, PA-C 10:59 AM  Patient seen and examined with Oda Kilts, PA-C. We discussed all aspects of the encounter. I agree with the assessment and plan as stated above.   Much improved with milrinone. Volume status looks good. Will continue current therapy. Check labs today. Discussed the fact that milrinone only for palliation and unclear how long benefit will last.   Bensimhon, Daniel,MD 1:43 PM

## 2016-03-01 NOTE — Patient Instructions (Addendum)
Labs today (will call for abnormal results, otherwise no news is good news)  Follow up in 4 weeks

## 2016-03-01 NOTE — Progress Notes (Signed)
Medication Samples have been provided to the patient.  Drug name: Eliquis     Strength: 2.5 mg     Qty: 28 (2 Boxes)   LOT: [id number]  Exp.Date: 9/18    The patient has been instructed regarding the correct time, dose, and frequency of taking this medication, including desired effects and most common side effects.   Trevor Simpson 11:26 AM 03/01/2016

## 2016-03-05 DIAGNOSIS — I509 Heart failure, unspecified: Secondary | ICD-10-CM | POA: Diagnosis not present

## 2016-03-05 DIAGNOSIS — Z09 Encounter for follow-up examination after completed treatment for conditions other than malignant neoplasm: Secondary | ICD-10-CM | POA: Diagnosis not present

## 2016-03-07 DIAGNOSIS — I130 Hypertensive heart and chronic kidney disease with heart failure and stage 1 through stage 4 chronic kidney disease, or unspecified chronic kidney disease: Secondary | ICD-10-CM | POA: Diagnosis not present

## 2016-03-07 DIAGNOSIS — I5022 Chronic systolic (congestive) heart failure: Secondary | ICD-10-CM | POA: Diagnosis not present

## 2016-03-07 DIAGNOSIS — Z86718 Personal history of other venous thrombosis and embolism: Secondary | ICD-10-CM | POA: Diagnosis not present

## 2016-03-07 DIAGNOSIS — Z7901 Long term (current) use of anticoagulants: Secondary | ICD-10-CM | POA: Diagnosis not present

## 2016-03-07 DIAGNOSIS — K219 Gastro-esophageal reflux disease without esophagitis: Secondary | ICD-10-CM | POA: Diagnosis not present

## 2016-03-07 DIAGNOSIS — I5033 Acute on chronic diastolic (congestive) heart failure: Secondary | ICD-10-CM | POA: Diagnosis not present

## 2016-03-07 DIAGNOSIS — N183 Chronic kidney disease, stage 3 (moderate): Secondary | ICD-10-CM | POA: Diagnosis not present

## 2016-03-07 DIAGNOSIS — Z950 Presence of cardiac pacemaker: Secondary | ICD-10-CM | POA: Diagnosis not present

## 2016-03-09 ENCOUNTER — Other Ambulatory Visit: Payer: Self-pay | Admitting: Internal Medicine

## 2016-03-14 DIAGNOSIS — I5033 Acute on chronic diastolic (congestive) heart failure: Secondary | ICD-10-CM | POA: Diagnosis not present

## 2016-03-14 DIAGNOSIS — J449 Chronic obstructive pulmonary disease, unspecified: Secondary | ICD-10-CM | POA: Diagnosis not present

## 2016-03-14 DIAGNOSIS — I5021 Acute systolic (congestive) heart failure: Secondary | ICD-10-CM | POA: Diagnosis not present

## 2016-03-14 DIAGNOSIS — Z7901 Long term (current) use of anticoagulants: Secondary | ICD-10-CM | POA: Diagnosis not present

## 2016-03-14 DIAGNOSIS — N183 Chronic kidney disease, stage 3 (moderate): Secondary | ICD-10-CM | POA: Diagnosis not present

## 2016-03-14 DIAGNOSIS — I130 Hypertensive heart and chronic kidney disease with heart failure and stage 1 through stage 4 chronic kidney disease, or unspecified chronic kidney disease: Secondary | ICD-10-CM | POA: Diagnosis not present

## 2016-03-14 DIAGNOSIS — K219 Gastro-esophageal reflux disease without esophagitis: Secondary | ICD-10-CM | POA: Diagnosis not present

## 2016-03-14 DIAGNOSIS — Z86718 Personal history of other venous thrombosis and embolism: Secondary | ICD-10-CM | POA: Diagnosis not present

## 2016-03-14 DIAGNOSIS — Z950 Presence of cardiac pacemaker: Secondary | ICD-10-CM | POA: Diagnosis not present

## 2016-03-14 DIAGNOSIS — I5022 Chronic systolic (congestive) heart failure: Secondary | ICD-10-CM | POA: Diagnosis not present

## 2016-03-15 DIAGNOSIS — L570 Actinic keratosis: Secondary | ICD-10-CM | POA: Diagnosis not present

## 2016-03-15 DIAGNOSIS — I480 Paroxysmal atrial fibrillation: Secondary | ICD-10-CM | POA: Diagnosis not present

## 2016-03-15 DIAGNOSIS — I10 Essential (primary) hypertension: Secondary | ICD-10-CM | POA: Diagnosis not present

## 2016-03-15 DIAGNOSIS — E119 Type 2 diabetes mellitus without complications: Secondary | ICD-10-CM | POA: Diagnosis not present

## 2016-03-19 DIAGNOSIS — I5022 Chronic systolic (congestive) heart failure: Secondary | ICD-10-CM | POA: Diagnosis not present

## 2016-03-19 DIAGNOSIS — J449 Chronic obstructive pulmonary disease, unspecified: Secondary | ICD-10-CM | POA: Diagnosis not present

## 2016-03-21 DIAGNOSIS — Z7901 Long term (current) use of anticoagulants: Secondary | ICD-10-CM | POA: Diagnosis not present

## 2016-03-21 DIAGNOSIS — Z86718 Personal history of other venous thrombosis and embolism: Secondary | ICD-10-CM | POA: Diagnosis not present

## 2016-03-21 DIAGNOSIS — K219 Gastro-esophageal reflux disease without esophagitis: Secondary | ICD-10-CM | POA: Diagnosis not present

## 2016-03-21 DIAGNOSIS — N183 Chronic kidney disease, stage 3 (moderate): Secondary | ICD-10-CM | POA: Diagnosis not present

## 2016-03-21 DIAGNOSIS — Z950 Presence of cardiac pacemaker: Secondary | ICD-10-CM | POA: Diagnosis not present

## 2016-03-21 DIAGNOSIS — I130 Hypertensive heart and chronic kidney disease with heart failure and stage 1 through stage 4 chronic kidney disease, or unspecified chronic kidney disease: Secondary | ICD-10-CM | POA: Diagnosis not present

## 2016-03-21 DIAGNOSIS — I5033 Acute on chronic diastolic (congestive) heart failure: Secondary | ICD-10-CM | POA: Diagnosis not present

## 2016-03-21 DIAGNOSIS — I5022 Chronic systolic (congestive) heart failure: Secondary | ICD-10-CM | POA: Diagnosis not present

## 2016-03-22 DIAGNOSIS — I5022 Chronic systolic (congestive) heart failure: Secondary | ICD-10-CM | POA: Diagnosis not present

## 2016-03-23 ENCOUNTER — Other Ambulatory Visit (HOSPITAL_COMMUNITY): Payer: Self-pay | Admitting: Internal Medicine

## 2016-03-23 DIAGNOSIS — I5022 Chronic systolic (congestive) heart failure: Secondary | ICD-10-CM | POA: Diagnosis not present

## 2016-03-23 DIAGNOSIS — J449 Chronic obstructive pulmonary disease, unspecified: Secondary | ICD-10-CM | POA: Diagnosis not present

## 2016-03-24 DIAGNOSIS — I5022 Chronic systolic (congestive) heart failure: Secondary | ICD-10-CM | POA: Diagnosis not present

## 2016-03-25 DIAGNOSIS — I5022 Chronic systolic (congestive) heart failure: Secondary | ICD-10-CM | POA: Diagnosis not present

## 2016-03-26 DIAGNOSIS — I5022 Chronic systolic (congestive) heart failure: Secondary | ICD-10-CM | POA: Diagnosis not present

## 2016-03-27 DIAGNOSIS — I5022 Chronic systolic (congestive) heart failure: Secondary | ICD-10-CM | POA: Diagnosis not present

## 2016-03-28 DIAGNOSIS — Z7901 Long term (current) use of anticoagulants: Secondary | ICD-10-CM | POA: Diagnosis not present

## 2016-03-28 DIAGNOSIS — K219 Gastro-esophageal reflux disease without esophagitis: Secondary | ICD-10-CM | POA: Diagnosis not present

## 2016-03-28 DIAGNOSIS — I5033 Acute on chronic diastolic (congestive) heart failure: Secondary | ICD-10-CM | POA: Diagnosis not present

## 2016-03-28 DIAGNOSIS — I130 Hypertensive heart and chronic kidney disease with heart failure and stage 1 through stage 4 chronic kidney disease, or unspecified chronic kidney disease: Secondary | ICD-10-CM | POA: Diagnosis not present

## 2016-03-28 DIAGNOSIS — Z950 Presence of cardiac pacemaker: Secondary | ICD-10-CM | POA: Diagnosis not present

## 2016-03-28 DIAGNOSIS — I5021 Acute systolic (congestive) heart failure: Secondary | ICD-10-CM | POA: Diagnosis not present

## 2016-03-28 DIAGNOSIS — I5022 Chronic systolic (congestive) heart failure: Secondary | ICD-10-CM | POA: Diagnosis not present

## 2016-03-28 DIAGNOSIS — Z86718 Personal history of other venous thrombosis and embolism: Secondary | ICD-10-CM | POA: Diagnosis not present

## 2016-03-28 DIAGNOSIS — N183 Chronic kidney disease, stage 3 (moderate): Secondary | ICD-10-CM | POA: Diagnosis not present

## 2016-03-29 ENCOUNTER — Ambulatory Visit (HOSPITAL_COMMUNITY)
Admission: RE | Admit: 2016-03-29 | Discharge: 2016-03-29 | Disposition: A | Discharge status: Home / Self Care | Payer: Medicare HMO | Source: Ambulatory Visit | Attending: Cardiology | Admitting: Cardiology

## 2016-03-29 ENCOUNTER — Telehealth (HOSPITAL_COMMUNITY): Payer: Self-pay | Admitting: Cardiology

## 2016-03-29 VITALS — BP 120/62 | HR 87 | Wt 154.0 lb

## 2016-03-29 DIAGNOSIS — I480 Paroxysmal atrial fibrillation: Secondary | ICD-10-CM | POA: Diagnosis not present

## 2016-03-29 DIAGNOSIS — I959 Hypotension, unspecified: Secondary | ICD-10-CM | POA: Insufficient documentation

## 2016-03-29 DIAGNOSIS — Z8249 Family history of ischemic heart disease and other diseases of the circulatory system: Secondary | ICD-10-CM | POA: Insufficient documentation

## 2016-03-29 DIAGNOSIS — Z833 Family history of diabetes mellitus: Secondary | ICD-10-CM | POA: Diagnosis not present

## 2016-03-29 DIAGNOSIS — Z8673 Personal history of transient ischemic attack (TIA), and cerebral infarction without residual deficits: Secondary | ICD-10-CM | POA: Insufficient documentation

## 2016-03-29 DIAGNOSIS — I5022 Chronic systolic (congestive) heart failure: Secondary | ICD-10-CM | POA: Diagnosis not present

## 2016-03-29 DIAGNOSIS — Z7951 Long term (current) use of inhaled steroids: Secondary | ICD-10-CM | POA: Diagnosis not present

## 2016-03-29 DIAGNOSIS — Z79899 Other long term (current) drug therapy: Secondary | ICD-10-CM | POA: Insufficient documentation

## 2016-03-29 DIAGNOSIS — Z95810 Presence of automatic (implantable) cardiac defibrillator: Secondary | ICD-10-CM | POA: Diagnosis not present

## 2016-03-29 DIAGNOSIS — I2510 Atherosclerotic heart disease of native coronary artery without angina pectoris: Secondary | ICD-10-CM | POA: Diagnosis not present

## 2016-03-29 DIAGNOSIS — I255 Ischemic cardiomyopathy: Secondary | ICD-10-CM | POA: Insufficient documentation

## 2016-03-29 DIAGNOSIS — Z801 Family history of malignant neoplasm of trachea, bronchus and lung: Secondary | ICD-10-CM | POA: Insufficient documentation

## 2016-03-29 DIAGNOSIS — Z7901 Long term (current) use of anticoagulants: Secondary | ICD-10-CM | POA: Diagnosis not present

## 2016-03-29 DIAGNOSIS — Z885 Allergy status to narcotic agent status: Secondary | ICD-10-CM | POA: Diagnosis not present

## 2016-03-29 DIAGNOSIS — N183 Chronic kidney disease, stage 3 (moderate): Secondary | ICD-10-CM | POA: Insufficient documentation

## 2016-03-29 DIAGNOSIS — J449 Chronic obstructive pulmonary disease, unspecified: Secondary | ICD-10-CM | POA: Diagnosis not present

## 2016-03-29 DIAGNOSIS — I130 Hypertensive heart and chronic kidney disease with heart failure and stage 1 through stage 4 chronic kidney disease, or unspecified chronic kidney disease: Secondary | ICD-10-CM | POA: Insufficient documentation

## 2016-03-29 LAB — BASIC METABOLIC PANEL
ANION GAP: 9 (ref 5–15)
BUN: 22 mg/dL — ABNORMAL HIGH (ref 6–20)
CALCIUM: 8.3 mg/dL — AB (ref 8.9–10.3)
CO2: 28 mmol/L (ref 22–32)
Chloride: 100 mmol/L — ABNORMAL LOW (ref 101–111)
Creatinine, Ser: 1.34 mg/dL — ABNORMAL HIGH (ref 0.61–1.24)
GFR calc non Af Amer: 48 mL/min — ABNORMAL LOW (ref >60)
GFR, EST AFRICAN AMERICAN: 56 mL/min — AB (ref >60)
GLUCOSE: 109 mg/dL — AB (ref 65–99)
POTASSIUM: 3.5 mmol/L (ref 3.5–5.1)
SODIUM: 137 mmol/L (ref 135–145)

## 2016-03-29 LAB — COOXEMETRY PANEL
CARBOXYHEMOGLOBIN: 0.7 % (ref 0.5–1.5)
Methemoglobin: 1.2 % (ref 0.0–1.5)
O2 SAT: 59.8 %
Total hemoglobin: 13.2 g/dL (ref 12.0–16.0)

## 2016-03-29 LAB — BRAIN NATRIURETIC PEPTIDE: B NATRIURETIC PEPTIDE 5: 1523.5 pg/mL — AB (ref 0.0–100.0)

## 2016-03-29 MED ORDER — FUROSEMIDE 80 MG PO TABS: ORAL_TABLET | ORAL | 3 refills | Status: DC

## 2016-03-29 MED ORDER — POTASSIUM CHLORIDE CRYS ER 20 MEQ PO TBCR: 20.0000 meq | EXTENDED_RELEASE_TABLET | Freq: Two times a day (BID) | ORAL | 3 refills | Status: DC

## 2016-03-29 NOTE — Progress Notes (Signed)
Advanced Heart Failure Medication Review by a Pharmacist  Does the patient  feel that his/her medications are working for him/her?  yes  Has the patient been experiencing any side effects to the medications prescribed?  no  Does the patient measure his/her own blood pressure or blood glucose at home?  yes   Does the patient have any problems obtaining medications due to transportation or finances?   no  Understanding of regimen: good Understanding of indications: good Potential of compliance: good Patient understands to avoid NSAIDs. Patient understands to avoid decongestants.  Issues to address at subsequent visits: None   Pharmacist comments:  Trevor Simpson is a pleasant 83 yo M presenting with his wife and son but without a medication list. He reports good compliance with his regimen but reports taking an additional 40 mg of lasix for the past week 2/2 weight gain. This has helped to bring his weight back down. He did not have any other medication-related questions or concerns for me at this time.   Ruta Hinds. Velva Harman, PharmD, BCPS, CPP Clinical Pharmacist Pager: [phone] Phone: [phone] 03/29/2016 12:18 PM      Time with patient: 10 minutes Preparation and documentation time: 2 minutes Total time: 12 minutes

## 2016-03-29 NOTE — Patient Instructions (Signed)
Take lasix 80 mg (1 tab) in am and 40 mg (1/2 tab) in pm.  Routine lab work today. Will notify you of abnormal results, otherwise no news is good news!  Follow up 2 weeks with Oda Kilts PA-C.  Do the following things EVERYDAY: 1) Weigh yourself in the morning before breakfast. Write it down and keep it in a log. 2) Take your medicines as prescribed 3) Eat low salt foods-Limit salt (sodium) to 2000 mg per day.  4) Stay as active as you can everyday 5) Limit all fluids for the day to less than 2 liters

## 2016-03-29 NOTE — Addendum Note (Signed)
Encounter addended by: Shirley Friar, PA-C on: 03/29/2016  1:18 PM<BR>    Actions taken: Sign clinical note

## 2016-03-29 NOTE — Telephone Encounter (Signed)
Patient advised to increase potassium to 20 meq BID, patient voiced understanding

## 2016-03-29 NOTE — Progress Notes (Addendum)
Patient ID: Trevor Simpson, male   DOB: 1933-10-19, 83 y.o.   MRN: [id number]    Advanced Heart Failure Clinic Note   PCP: Dr Maudie Mercury Pulmonology: Dr Chase Caller Cardiology: Dr Lovena Le  HPI: Trevor Simpson is a 83 y.o. year old with history of  COPD, symptomatic bradycardia, PAF, HTN, PAD, CVA, chronic systolic heart failure and ischemic cardiomyopathy (EF 20-25% echo 7/16)  s/p CRT-D on 09/26/13  Admitted in 6/15 with low-output. RHC as below. Started on milrinone. In July 2015 underwent CRT-D upgrade Mean RA 11 PA 53/24  Mean PCWP 22 CI 1.5  Echo (6/15): EF 15%, moderate central Trevor, mild to moderately decreased RV systolic function.   Milrinone stopped in September 2015 due to patient preference, despite borderline co-ox.  Admitted 81/1 -> 02/21/16 with CP and profound fatigue. Had recently been scheduled for RHC, but came to ED with worsening symptoms. RHC with depressed cardiac output. Started on milrinone. Titrated up to 0.375 mcg with continued low mixed venous sat.  Improved with diureses and milrinone. Coox 67% on discharge.   83 presents today for regular follow up. Has been feeling worse over the past weekend and fluid has been building up.  Has taken extra lasix since Saturday. 120 mg every morning. Remains very fatigued. Still having SOB with bathing and putting on clothes, having good and bad days.  Hasn't been very active this week with feelings. HR has been up 120 this am.  BP has been 110-120s. Weight at home at 146.6 lbs this am. Usually closer to 142-145. Getting over a cold. Wife had as well.   Corevue: Would not send data.   ROS: all other systems reviewed and negative except as in HPI.   RHC 12/1: RA = 11 RV = 56/14 PA = 55/26 (38) PCW = 27 mean V wave to 40 Fick cardiac output/index = 2.4/1.3 PVR = 4.5 WU Ao sat = 99% PA sat = 41%, 43%  Labs:  7/14 AST 27 ALT 21  11/14 K 3.8 Creatinine  1.41  12/14 Pro BNP 1100 4/14: K 3.9, creatinine 1.4, BUN 22 6/15: K 4.1,  creatinine 1.2 7/15: K 3.7, creatinine 1.36, HCT 43.7, co-ox 66.5% 8/15: digoxin 1.2, co-ox 55%, K 4.0, creatinine 1.36 9/15: co-ox 56.8% 11/15 K 3.8, creatinine 1.25 10/17/14: K 3.3 creatinine 1.34   SH: Lives at home with wife  FH: Father, Brother - CAD HTN, DM        Sister - lung cancer     Current Outpatient Prescriptions  Medication Sig Dispense Refill  . ALPRAZolam (XANAX) 0.25 MG tablet Take 0.375 mg by mouth at bedtime.    Marland Kitchen amiodarone (PACERONE) 200 MG tablet Take 0.5 tablets (100 mg total) by mouth daily. 45 tablet 3  . apixaban (ELIQUIS) 2.5 MG TABS tablet Take 1 tablet (2.5 mg total) by mouth 2 (two) times daily. 60 tablet 3  . atorvastatin (LIPITOR) 20 MG tablet Take 20 mg by mouth daily at 6 PM.     . B Complex-C (SUPER B COMPLEX) TABS Take 1 tablet by mouth daily.     . calcium carbonate (OS-CAL) 600 MG TABS Take 600 mg by mouth daily with breakfast.     . cetirizine (ZYRTEC) 10 MG tablet Take 10 mg by mouth daily.     . digoxin (LANOXIN) 0.125 MG tablet Take 0.5 tablets (0.0625 mg total) by mouth daily. 90 tablet 3  . doxylamine, Sleep, (UNISOM) 25 MG tablet Take 25 mg by mouth at bedtime.    Marland Kitchen  fluticasone (FLONASE) 50 MCG/ACT nasal spray Place 1 spray into both nostrils daily as needed for allergies. Reported on 04/11/2015    . furosemide (LASIX) 80 MG tablet TAKE 1 TABLET BY MOUTH EVERY DAY 90 tablet 3  . levothyroxine (SYNTHROID, LEVOTHROID) 112 MCG tablet Take 112 mcg by mouth See admin instructions. Pt takes Monday- Saturday - skips on Sundays    . magnesium gluconate (MAGONATE) 500 MG tablet Take 500 mg by mouth daily.     . milrinone (PRIMACOR) 20 MG/100 ML SOLN infusion Inject 26.775 mcg/min into the vein continuous.    . Multiple Vitamin (MULTIVITAMIN WITH MINERALS) TABS Take 1 tablet by mouth daily.    . nitroGLYCERIN (NITROSTAT) 0.4 MG SL tablet Place 1 tablet (0.4 mg total) under the tongue every 5 (five) minutes as needed for chest pain. 25 tablet 3  .  Omega-3 Fatty Acids (FISH OIL) 1200 MG CAPS Take 1,200 mg by mouth daily.     Marland Kitchen omeprazole (PRILOSEC) 20 MG capsule Take 20 mg by mouth daily as needed (indigestion).     . potassium chloride SA (K-DUR,KLOR-CON) 20 MEQ tablet Take 20 mEq by mouth daily.    . Saw Palmetto, Serenoa repens, (SAW PALMETTO PO) Take 1 capsule by mouth 2 (two) times daily.    . vitamin C (ASCORBIC ACID) 500 MG tablet Take 500 mg by mouth daily.       No current facility-administered medications for this encounter.      Allergies  Allergen Reactions  . Codeine     "tripped out on it"  . Morphine     "tripped out on it"    Vitals:   03/29/16 1138  BP: 120/62  BP Location: Left Arm  Patient Position: Sitting  Cuff Size: Normal  Pulse: 87  SpO2: 96%  Weight: 154 lb (69.9 kg)   Wt Readings from Last 3 Encounters:  03/29/16 154 lb (69.9 kg)  03/01/16 157 lb (71.2 kg)  02/21/16 151 lb 6.4 oz (68.7 kg)     PHYSICAL EXAM: General: Elderly and chronically ill appearing. Thin.   HEENT: Normal except for temporal wasting. Neck: Supple. JVP does not appear elevated. Carotids 2+ bilat; no bruits. No thyromegaly or nodule noted.  Cor: Regular. 2/6 HSM apex. +S3  RIJ tunneled PICC.   Lungs: CTAB, normal effort.  Abdomen: soft, NT, ND, no HSM. No bruits or masses. +BS  Extremities: no cyanosis, clubbing, rash. No peripheral edema. Warm.   Neuro: Alert & oriented x 3, cranial nerves grossly intact. moves all 4 extremities w/o difficulty. Affect pleasant.   ASSESSMENT & PLAN: 1. Chronic Systolic Heart Failure: Ischemic cardiomyopathy s/p CRT-D; Echo 6/15 with EF 15%, moderate Trevor, mild to moderately decreased RV systolic function. Echo 7/16 EF 20-25% mild Trevor/AI. RV mild dysfunction  RHC in 6/15 showed CI 1.5 so patient was started on milrinone.  Milrinone stopped in 11/2013.  Improved after CRT optimization - Chronic NYHA IIIb symptoms.  - Volume status stable. Continue lasix 80 mg q am and 40 mg q pm for now.  BMET/BNP today.  - Continue milrinone 0.375 mcg/kg/min for palliation. Pt is not a candidate for advanced therapies given his age and co-morbidities. He has been feeling worse over the past week so will check coox.  - Echo 01/19/16 LVEF 25%, RV mod reduced - Off BB with low output.  - Does not tolerate addition of ARB with hypotension. No ivabradine with HR < 70.  - Reinforced fluid restriction to < 2  L daily, sodium restriction to less than 2000 mg daily, and the importance of daily weights.   - Agree with current plan.  2. PAF: S/P DC-CV 09/2012.   - Stable on low dose amiodarone.  - Continue Eliquis 2.5 BID. Denies bleeding.  - Recent LFTs and TFTs stable.  3. CAD: Extensive disease burden.  - No CP.  - Continue statin. Lipids per PCP.   - No aspirin given stable CAD and use of Eliquis 4. CKD stage III - check BMET today with increased lasix need.  5. Hypotension - Stable. Will not up-titrate meds.   Had been feeling worse. Taking more lasix. Will keep on increased dose of 80 mg q am and 40 mg q pm for now.  Check labs today. Follow up in 2 weeks. Knows to call sooner with any symptoms.  Shirley Friar, PA-C 11:51 AM  Total time spent > 25 minutes. Over half that spent discussing the above.

## 2016-03-29 NOTE — Telephone Encounter (Signed)
-----   Message from Shirley Friar, PA-C sent at 03/29/2016  1:33 PM EST ----- Labs OK.  Coox marginal but steady.   Please increase lasix to 20 meq BID. Keep 2 week follow up.     Legrand Como [address]" [place], PA-C 03/29/2016 1:33 PM

## 2016-04-04 DIAGNOSIS — I5022 Chronic systolic (congestive) heart failure: Secondary | ICD-10-CM | POA: Diagnosis not present

## 2016-04-04 DIAGNOSIS — Z7901 Long term (current) use of anticoagulants: Secondary | ICD-10-CM | POA: Diagnosis not present

## 2016-04-04 DIAGNOSIS — I130 Hypertensive heart and chronic kidney disease with heart failure and stage 1 through stage 4 chronic kidney disease, or unspecified chronic kidney disease: Secondary | ICD-10-CM | POA: Diagnosis not present

## 2016-04-04 DIAGNOSIS — Z86718 Personal history of other venous thrombosis and embolism: Secondary | ICD-10-CM | POA: Diagnosis not present

## 2016-04-04 DIAGNOSIS — K219 Gastro-esophageal reflux disease without esophagitis: Secondary | ICD-10-CM | POA: Diagnosis not present

## 2016-04-04 DIAGNOSIS — Z950 Presence of cardiac pacemaker: Secondary | ICD-10-CM | POA: Diagnosis not present

## 2016-04-04 DIAGNOSIS — I5033 Acute on chronic diastolic (congestive) heart failure: Secondary | ICD-10-CM | POA: Diagnosis not present

## 2016-04-04 DIAGNOSIS — N183 Chronic kidney disease, stage 3 (moderate): Secondary | ICD-10-CM | POA: Diagnosis not present

## 2016-04-05 DIAGNOSIS — I5022 Chronic systolic (congestive) heart failure: Secondary | ICD-10-CM | POA: Diagnosis not present

## 2016-04-06 DIAGNOSIS — I5022 Chronic systolic (congestive) heart failure: Secondary | ICD-10-CM | POA: Diagnosis not present

## 2016-04-07 DIAGNOSIS — I5022 Chronic systolic (congestive) heart failure: Secondary | ICD-10-CM | POA: Diagnosis not present

## 2016-04-08 DIAGNOSIS — I5022 Chronic systolic (congestive) heart failure: Secondary | ICD-10-CM | POA: Diagnosis not present

## 2016-04-09 ENCOUNTER — Other Ambulatory Visit: Payer: Self-pay | Admitting: Dermatology

## 2016-04-09 DIAGNOSIS — D229 Melanocytic nevi, unspecified: Secondary | ICD-10-CM | POA: Diagnosis not present

## 2016-04-09 DIAGNOSIS — L570 Actinic keratosis: Secondary | ICD-10-CM | POA: Diagnosis not present

## 2016-04-09 DIAGNOSIS — D044 Carcinoma in situ of skin of scalp and neck: Secondary | ICD-10-CM | POA: Diagnosis not present

## 2016-04-09 DIAGNOSIS — D0439 Carcinoma in situ of skin of other parts of face: Secondary | ICD-10-CM | POA: Diagnosis not present

## 2016-04-09 DIAGNOSIS — I5022 Chronic systolic (congestive) heart failure: Secondary | ICD-10-CM | POA: Diagnosis not present

## 2016-04-10 DIAGNOSIS — I5022 Chronic systolic (congestive) heart failure: Secondary | ICD-10-CM | POA: Diagnosis not present

## 2016-04-11 ENCOUNTER — Encounter: Payer: Self-pay | Admitting: *Deleted

## 2016-04-11 DIAGNOSIS — Z950 Presence of cardiac pacemaker: Secondary | ICD-10-CM | POA: Diagnosis not present

## 2016-04-11 DIAGNOSIS — I5021 Acute systolic (congestive) heart failure: Secondary | ICD-10-CM | POA: Diagnosis not present

## 2016-04-11 DIAGNOSIS — Z86718 Personal history of other venous thrombosis and embolism: Secondary | ICD-10-CM | POA: Diagnosis not present

## 2016-04-11 DIAGNOSIS — Z7901 Long term (current) use of anticoagulants: Secondary | ICD-10-CM | POA: Diagnosis not present

## 2016-04-11 DIAGNOSIS — I5022 Chronic systolic (congestive) heart failure: Secondary | ICD-10-CM | POA: Diagnosis not present

## 2016-04-11 DIAGNOSIS — K219 Gastro-esophageal reflux disease without esophagitis: Secondary | ICD-10-CM | POA: Diagnosis not present

## 2016-04-11 DIAGNOSIS — I5033 Acute on chronic diastolic (congestive) heart failure: Secondary | ICD-10-CM | POA: Diagnosis not present

## 2016-04-11 DIAGNOSIS — N183 Chronic kidney disease, stage 3 (moderate): Secondary | ICD-10-CM | POA: Diagnosis not present

## 2016-04-11 DIAGNOSIS — I130 Hypertensive heart and chronic kidney disease with heart failure and stage 1 through stage 4 chronic kidney disease, or unspecified chronic kidney disease: Secondary | ICD-10-CM | POA: Diagnosis not present

## 2016-04-12 ENCOUNTER — Ambulatory Visit (HOSPITAL_COMMUNITY)
Admission: RE | Admit: 2016-04-12 | Discharge: 2016-04-12 | Disposition: A | Discharge status: Home / Self Care | Payer: Medicare HMO | Source: Ambulatory Visit | Attending: Internal Medicine | Admitting: Internal Medicine

## 2016-04-12 VITALS — BP 116/52 | HR 111 | Wt 151.6 lb

## 2016-04-12 DIAGNOSIS — I2510 Atherosclerotic heart disease of native coronary artery without angina pectoris: Secondary | ICD-10-CM | POA: Insufficient documentation

## 2016-04-12 DIAGNOSIS — I959 Hypotension, unspecified: Secondary | ICD-10-CM | POA: Insufficient documentation

## 2016-04-12 DIAGNOSIS — I255 Ischemic cardiomyopathy: Secondary | ICD-10-CM | POA: Diagnosis not present

## 2016-04-12 DIAGNOSIS — Z801 Family history of malignant neoplasm of trachea, bronchus and lung: Secondary | ICD-10-CM | POA: Insufficient documentation

## 2016-04-12 DIAGNOSIS — N183 Chronic kidney disease, stage 3 unspecified: Secondary | ICD-10-CM

## 2016-04-12 DIAGNOSIS — I130 Hypertensive heart and chronic kidney disease with heart failure and stage 1 through stage 4 chronic kidney disease, or unspecified chronic kidney disease: Secondary | ICD-10-CM | POA: Insufficient documentation

## 2016-04-12 DIAGNOSIS — I480 Paroxysmal atrial fibrillation: Secondary | ICD-10-CM | POA: Insufficient documentation

## 2016-04-12 DIAGNOSIS — Z8673 Personal history of transient ischemic attack (TIA), and cerebral infarction without residual deficits: Secondary | ICD-10-CM | POA: Insufficient documentation

## 2016-04-12 DIAGNOSIS — I5022 Chronic systolic (congestive) heart failure: Secondary | ICD-10-CM | POA: Diagnosis not present

## 2016-04-12 DIAGNOSIS — Z8249 Family history of ischemic heart disease and other diseases of the circulatory system: Secondary | ICD-10-CM | POA: Insufficient documentation

## 2016-04-12 DIAGNOSIS — Z833 Family history of diabetes mellitus: Secondary | ICD-10-CM | POA: Insufficient documentation

## 2016-04-12 DIAGNOSIS — Z7901 Long term (current) use of anticoagulants: Secondary | ICD-10-CM | POA: Diagnosis not present

## 2016-04-12 DIAGNOSIS — J449 Chronic obstructive pulmonary disease, unspecified: Secondary | ICD-10-CM | POA: Diagnosis not present

## 2016-04-12 DIAGNOSIS — Z885 Allergy status to narcotic agent status: Secondary | ICD-10-CM | POA: Insufficient documentation

## 2016-04-12 NOTE — Patient Instructions (Signed)
No changes to medication today.  No lab work.  Device screening within normal limits.  Follow up 4 weeks with Dr. Haroldine Laws.  Do the following things EVERYDAY: 1) Weigh yourself in the morning before breakfast. Write it down and keep it in a log. 2) Take your medicines as prescribed 3) Eat low salt foods-Limit salt (sodium) to 2000 mg per day.  4) Stay as active as you can everyday 5) Limit all fluids for the day to less than 2 liters

## 2016-04-12 NOTE — Progress Notes (Signed)
Patient ID: Trevor Simpson, male   DOB: 01-10-1934, 83 y.o.   MRN: [id number]    Advanced Heart Failure Clinic Note   PCP: Dr Maudie Mercury Pulmonology: Dr Chase Caller Cardiology: Dr Lovena Le  HPI: Trevor Simpson is a 83 y.o. year old with history of  COPD, symptomatic bradycardia, PAF, HTN, PAD, CVA, chronic systolic heart failure and ischemic cardiomyopathy (EF 20-25% echo 7/16)  s/p CRT-D on 09/26/13  Admitted in 6/15 with low-output. RHC as below. Started on milrinone. In July 2015 underwent CRT-D upgrade Mean RA 11 PA 53/24  Mean PCWP 22 CI 1.5  Echo (6/15): EF 15%, moderate central Trevor, mild to moderately decreased RV systolic function.   Milrinone stopped in September 2015 due to patient preference, despite borderline co-ox.  Admitted 12/1 -> 02/21/16 with CP and profound fatigue. Had recently been scheduled for RHC, but came to ED with worsening symptoms. RHC with depressed cardiac output. Started on milrinone. Titrated up to 0.375 mcg with continued low mixed venous sat.  Improved with diureses and milrinone. Coox 67% on discharge.   He presents today for regular follow up.Weight down 3 lbs from last visit. Had some chest pain last week.  Had been a long time prior to that, and hadn't have any since.  Took 4 NTGs and over 15-20 minutes with complete resolution. Haven't needed to take any extra fluid pills.  Taking 80 mg daily.  Still gets SOB with getting dressed. Denies SOB with showering, but sits on a stool. Energy level about the same. Weight at home 143 lbs. HR 100-110s at home. BP has been 90-110s at home.  Denies lightheadedness or dizziness   Corevue: Thoracic impedence above threshold.  No VT/VF. AF burden 2.3%.   ROS: all other systems reviewed and negative except as in HPI.   [place] 12/1: RA = 11 RV = 56/14 PA = 55/26 (38) PCW = 27 mean V wave to 40 Fick cardiac output/index = 2.4/1.3 PVR = 4.5 WU Ao sat = 99% PA sat = 41%, 43%  LHC 08/2012  Left mainstem: The left main is long  with 20% stenosis distally.  Left anterior descending (LAD): The left anterior descending artery is moderately calcified in the proximal vessel. There is diffuse disease in the proximal vessel up to 30%. The first diagonal is relatively small and has mild disease proximally. The ramus intermediate branch is a large branch that bifurcates in the mid vessel. The proximal vessel has a 90-95% stenosis.  Left circumflex (LCx): The left circumflex is relatively small. It gives rise to a single marginal branch and then continues in the AV groove. The first marginal branch has diffuse 60-70% stenosis proximally.  Right coronary artery (RCA): The right coronary is occluded proximally. There are right to right and left to right collaterals. Severe two-vessel obstructive coronary disease. Chronic total occlusion of the right coronary Successful stenting of the ramus intermediate branch with a bare-metal stent  Labs:  7/14 AST 27 ALT 21  11/14 K 3.8 Creatinine  1.41  12/14 Pro BNP 1100 4/14: K 3.9, creatinine 1.4, BUN 22 6/15: K 4.1, creatinine 1.2 7/15: K 3.7, creatinine 1.36, HCT 43.7, co-ox 66.5% 8/15: digoxin 1.2, co-ox 55%, K 4.0, creatinine 1.36 9/15: co-ox 56.8% 11/15 K 3.8, creatinine 1.25 10/17/14: K 3.3 creatinine 1.34   SH: Lives at home with wife  FH: Father, Brother - CAD HTN, DM        Sister - lung cancer     Current Outpatient Prescriptions  Medication Sig Dispense Refill  . ALPRAZolam (XANAX) 0.25 MG tablet Take 0.375 mg by mouth at bedtime.    Marland Kitchen amiodarone (PACERONE) 200 MG tablet Take 0.5 tablets (100 mg total) by mouth daily. 45 tablet 3  . apixaban (ELIQUIS) 2.5 MG TABS tablet Take 1 tablet (2.5 mg total) by mouth 2 (two) times daily. 60 tablet 3  . atorvastatin (LIPITOR) 20 MG tablet Take 20 mg by mouth daily at 6 PM.     . B Complex-C (SUPER B COMPLEX) TABS Take 1 tablet by mouth daily.     . calcium carbonate (OS-CAL) 600 MG TABS Take 600 mg by mouth daily with breakfast.      . cetirizine (ZYRTEC) 10 MG tablet Take 10 mg by mouth daily.     . digoxin (LANOXIN) 0.125 MG tablet Take 0.5 tablets (0.0625 mg total) by mouth daily. 90 tablet 3  . doxylamine, Sleep, (UNISOM) 25 MG tablet Take 25 mg by mouth at bedtime.    . fluticasone (FLONASE) 50 MCG/ACT nasal spray Place 1 spray into both nostrils daily as needed for allergies. Reported on 04/11/2015    . furosemide (LASIX) 80 MG tablet Take 80 mg by mouth daily.    Marland Kitchen levothyroxine (SYNTHROID, LEVOTHROID) 112 MCG tablet Take 112 mcg by mouth See admin instructions. Pt takes Monday- Saturday - skips on Sundays    . magnesium gluconate (MAGONATE) 500 MG tablet Take 500 mg by mouth daily.     . milrinone (PRIMACOR) 20 MG/100 ML SOLN infusion Inject 26.775 mcg/min into the vein continuous.    . Multiple Vitamin (MULTIVITAMIN WITH MINERALS) TABS Take 1 tablet by mouth daily.    . Omega-3 Fatty Acids (FISH OIL) 1200 MG CAPS Take 1,200 mg by mouth daily.     Marland Kitchen omeprazole (PRILOSEC) 20 MG capsule Take 20 mg by mouth daily as needed (indigestion).     . potassium chloride SA (K-DUR,KLOR-CON) 20 MEQ tablet Take 1 tablet (20 mEq total) by mouth 2 (two) times daily. 60 tablet 3  . Saw Palmetto, Serenoa repens, (SAW PALMETTO PO) Take 1 capsule by mouth 2 (two) times daily.    Marland Kitchen senna (SENOKOT) 8.6 MG TABS tablet Take 1 tablet by mouth daily as needed for mild constipation.    . vitamin C (ASCORBIC ACID) 500 MG tablet Take 500 mg by mouth daily.      . nitroGLYCERIN (NITROSTAT) 0.4 MG SL tablet Place 1 tablet (0.4 mg total) under the tongue every 5 (five) minutes as needed for chest pain. (Patient not taking: Reported on 04/12/2016) 25 tablet 3   No current facility-administered medications for this encounter.      Allergies  Allergen Reactions  . Codeine     "tripped out on it"  . Morphine     "tripped out on it"    Vitals:   04/12/16 1116  BP: (!) 116/52  BP Location: Right Arm  Patient Position: Sitting  Cuff Size:  Normal  Pulse: (!) 111  SpO2: 99%  Weight: 151 lb 9.6 oz (68.8 kg)   Wt Readings from Last 3 Encounters:  04/12/16 151 lb 9.6 oz (68.8 kg)  03/29/16 154 lb (69.9 kg)  03/01/16 157 lb (71.2 kg)     PHYSICAL EXAM: General: Elderly and chronically ill appearing. Thin.    HEENT: Normal.  Neck: Supple. JVP not elevated on exam. Carotids 2+ bilat; no bruits. No thyromegaly or nodule noted.  Cor: Regular. 2/6 HSM apex. +S3  RIJ tunneled PICC.  Lungs: Clear, normal effort.  Abdomen: soft, NT, ND, no HSM. No bruits or masses. +BS  Extremities: no cyanosis, clubbing, rash. No peripheral edema. Warm.   Neuro: Alert & oriented x 3, cranial nerves grossly intact. moves all 4 extremities w/o difficulty. Affect pleasant.   ASSESSMENT & PLAN: 1. Chronic Systolic Heart Failure: Ischemic cardiomyopathy s/p CRT-D; Echo 6/15 with EF 15%, moderate Trevor, mild to moderately decreased RV systolic function. Echo 7/16 EF 20-25% mild Trevor/AI. RV mild dysfunction  RHC in 6/15 showed CI 1.5 so patient was started on milrinone.  Milrinone stopped in 11/2013.  Improved after CRT optimization - Chronic NYHA IIIb symptoms.  - Volume status stable on exam. Continue lasix 80 mg daily with extra 40 mg as needed. Labs drawn by [facility] 04/11/16 so will await these.  - Continue milrinone 0.375 mcg/kg/min for palliation. Pt is not a candidate for advanced therapies given his age and co-morbidities. Coox marginal at last visit. No indication to recheck today.  - Echo 01/19/16 LVEF 25%, RV mod reduced - Off BB with low output.  - Does not tolerate addition of ARB with hypotension. No ivabradine with HR < 70 and PAF - Reinforced fluid restriction to < 2 L daily, sodium restriction to less than 2000 mg daily, and the importance of daily weights.   2. PAF: S/P DC-CV 09/2012.   - Stable on low dose amiodarone.  - Continue Eliquis 2.5 BID. Denies bleeding.  - Recent LFTs and TFTs stable.  - No change. 3. CAD: Extensive disease burden by  cath 08/2012. - Atypical chest pain last week, pressure at rest, but did relieve with Nitro.  Discussed return prompts and when to call 911.  - Continue statin. Lipids per PCP.   - No aspirin with relatively stable CAD and use of Eliquis 4. CKD stage III - Awaiting BMET via [place].  5. Hypotension - Stable. Will not uptitrate meds with low output.   Device interrogation unremarkable. Had some atypical chest pain last week that resolved with Nitro.  Encouraged to call or return to ED with any recurrence.   Follow up 3-4 weeks MD.   Shirley Friar, PA-C  11:31 AM

## 2016-04-12 NOTE — Progress Notes (Signed)
Advanced Heart Failure Medication Review by a Pharmacist  Does the patient  feel that his/her medications are working for him/her?  yes  Has the patient been experiencing any side effects to the medications prescribed?  no  Does the patient measure his/her own blood pressure or blood glucose at home?  no   Does the patient have any problems obtaining medications due to transportation or finances?   no  Understanding of regimen: good Understanding of indications: good Potential of compliance: good Patient understands to avoid NSAIDs. Patient understands to avoid decongestants.  Issues to address at subsequent visits: None   Pharmacist comments: Mr. Kahrs is a pleasant 83 yo M presenting with his wife and daughter and without a medication list but with good recall of his regimen. He reports good compliance with his regimen and his only concern was that he required 4 NTG SL tablets last Wednesday to relieve his chest pain (usually only needs 1-2).   Ruta Hinds. Velva Harman, PharmD, BCPS, CPP Clinical Pharmacist Pager: [phone] Phone: [phone] 04/12/2016 11:29 AM      Time with patient: 10 minutes Preparation and documentation time: 2 minutes Total time: 12 minutes

## 2016-04-13 DIAGNOSIS — I5022 Chronic systolic (congestive) heart failure: Secondary | ICD-10-CM | POA: Diagnosis not present

## 2016-04-14 DIAGNOSIS — I5022 Chronic systolic (congestive) heart failure: Secondary | ICD-10-CM | POA: Diagnosis not present

## 2016-04-15 DIAGNOSIS — I5022 Chronic systolic (congestive) heart failure: Secondary | ICD-10-CM | POA: Diagnosis not present

## 2016-04-16 DIAGNOSIS — I5022 Chronic systolic (congestive) heart failure: Secondary | ICD-10-CM | POA: Diagnosis not present

## 2016-04-17 ENCOUNTER — Ambulatory Visit (INDEPENDENT_AMBULATORY_CARE_PROVIDER_SITE_OTHER): Payer: Medicare HMO | Admitting: Internal Medicine

## 2016-04-17 ENCOUNTER — Encounter: Payer: Self-pay | Admitting: Internal Medicine

## 2016-04-17 ENCOUNTER — Ambulatory Visit: Payer: Medicare HMO | Admitting: Internal Medicine

## 2016-04-17 VITALS — BP 98/60 | HR 74 | Ht 71.0 in | Wt 150.4 lb

## 2016-04-17 DIAGNOSIS — I480 Paroxysmal atrial fibrillation: Secondary | ICD-10-CM

## 2016-04-17 DIAGNOSIS — I255 Ischemic cardiomyopathy: Secondary | ICD-10-CM | POA: Diagnosis not present

## 2016-04-17 DIAGNOSIS — I5022 Chronic systolic (congestive) heart failure: Secondary | ICD-10-CM | POA: Diagnosis not present

## 2016-04-17 DIAGNOSIS — Z95810 Presence of automatic (implantable) cardiac defibrillator: Secondary | ICD-10-CM

## 2016-04-17 NOTE — Progress Notes (Signed)
HPI Trevor Simpson returns today for followup. He is a very pleasant 83 year old man with a near end-stage ischemic cardiomyopathy, left branch block, paroxysmal atrial fibrillation, who underwent biventricular ICD implantation approximately 33 months ago. He was on IV milrinone came off but then placed back on it. While he still remains weak, his symptoms have improved. He has been in the hospital with CHF but now feels better. He denies anginal symptoms.  No peripheral edema. No ICD shock. His weight is actually down. No edema. Allergies  Allergen Reactions  . Codeine     "tripped out on it"  . Morphine     "tripped out on it"     Current Outpatient Prescriptions  Medication Sig Dispense Refill  . ALPRAZolam (XANAX) 0.25 MG tablet Take 0.375 mg by mouth at bedtime.    Marland Kitchen amiodarone (PACERONE) 200 MG tablet Take 0.5 tablets (100 mg total) by mouth daily. 45 tablet 3  . apixaban (ELIQUIS) 2.5 MG TABS tablet Take 1 tablet (2.5 mg total) by mouth 2 (two) times daily. 60 tablet 3  . atorvastatin (LIPITOR) 20 MG tablet Take 20 mg by mouth daily at 6 PM.     . B Complex-C (SUPER B COMPLEX) TABS Take 1 tablet by mouth daily.     . calcium carbonate (OS-CAL) 600 MG TABS Take 600 mg by mouth daily with breakfast.     . cetirizine (ZYRTEC) 10 MG tablet Take 10 mg by mouth daily.     . digoxin (LANOXIN) 0.125 MG tablet Take 0.5 tablets (0.0625 mg total) by mouth daily. 90 tablet 3  . doxylamine, Sleep, (UNISOM) 25 MG tablet Take 25 mg by mouth at bedtime.    . fluticasone (FLONASE) 50 MCG/ACT nasal spray Place 1 spray into both nostrils daily as needed for allergies. Reported on 04/11/2015    . furosemide (LASIX) 80 MG tablet Take 80 mg by mouth daily.    Marland Kitchen levothyroxine (SYNTHROID, LEVOTHROID) 112 MCG tablet Take 112 mcg by mouth See admin instructions. Pt takes Monday- Saturday - skips on Sundays    . magnesium gluconate (MAGONATE) 500 MG tablet Take 500 mg by mouth daily.     . milrinone  (PRIMACOR) 20 MG/100 ML SOLN infusion Inject 26.775 mcg/min into the vein continuous.    . Multiple Vitamin (MULTIVITAMIN WITH MINERALS) TABS Take 1 tablet by mouth daily.    . nitroGLYCERIN (NITROSTAT) 0.4 MG SL tablet Place 1 tablet (0.4 mg total) under the tongue every 5 (five) minutes as needed for chest pain. (Patient not taking: Reported on 04/12/2016) 25 tablet 3  . Omega-3 Fatty Acids (FISH OIL) 1200 MG CAPS Take 1,200 mg by mouth daily.     Marland Kitchen omeprazole (PRILOSEC) 20 MG capsule Take 20 mg by mouth daily as needed (indigestion).     . potassium chloride SA (K-DUR,KLOR-CON) 20 MEQ tablet Take 1 tablet (20 mEq total) by mouth 2 (two) times daily. 60 tablet 3  . Saw Palmetto, Serenoa repens, (SAW PALMETTO PO) Take 1 capsule by mouth 2 (two) times daily.    Marland Kitchen senna (SENOKOT) 8.6 MG TABS tablet Take 1 tablet by mouth daily as needed for mild constipation.    . vitamin C (ASCORBIC ACID) 500 MG tablet Take 500 mg by mouth daily.       No current facility-administered medications for this visit.      Past Medical History:  Diagnosis Date  . AAA (abdominal aortic aneurysm) ([place])    repaired  .  Anxiety   . Arthritis    "left elbow and shoulder" (09/24/2013)  . Atrial fibrillation ([place])   . Automatic implantable cardioverter-defibrillator in situ   . Carotid artery occlusion   . Colon polyp   . Congestive heart failure ([place])   . Coronary artery disease    a. LHC (08/2012): Lmain: long, 20% stenosis distally LAD: mod calcified and mild dz prox. diff dz proximally 30%, first diagonal mild dz prox and small, Ramus intermediate brach lg bifurcates mid vessel prox. 90-95% stenosis LCx: relatively sml, rise to single marginal and contiine AV groove, 1st marg. diff 60-70% dz stenosis prox RCA: occ. prox. R-L coll, successful stent remus interm branch BMS  . GERD (gastroesophageal reflux disease)   . H/O blood clots   . HCAP (healthcare-associated pneumonia) 2014  . Hyperlipidemia   . Hypertension     . Hypothyroidism   . Ischemic cardiomyopathy   . Myocardial infarction   . Other and unspecified hyperlipidemia   . Pneumonia   . PVD (peripheral vascular disease) ([place])    s/p renal artery stent 20-04  . Sinoatrial node dysfunction ([facility])   . Stroke [facility]) 2014   left sided affected no residual effects  . Systolic congestive heart failure ([place])    a. ICM b.     ROS:   All systems reviewed and negative except as noted in the HPI.   Past Surgical History:  Procedure Laterality Date  . ABDOMINAL AORTIC ANEURYSM REPAIR  05/17/1993  . BI-VENTRICULAR IMPLANTABLE CARDIOVERTER DEFIBRILLATOR N/A 09/24/2013   Procedure: BI-VENTRICULAR IMPLANTABLE CARDIOVERTER DEFIBRILLATOR  (CRT-D);  Surgeon: Evans Lance, MD;  Location: [facility] CATH LAB;  Service: Cardiovascular;  Laterality: N/A;  . BI-VENTRICULAR IMPLANTABLE CARDIOVERTER DEFIBRILLATOR  (CRT-D)  09/24/2013  . CARDIAC CATHETERIZATION N/A 02/17/2016   Procedure: Right Heart Cath;  Surgeon: Jolaine Artist, MD;  Location: [facility] CV LAB;  Service: Cardiovascular;  Laterality: N/A;  . CARDIAC PACEMAKER PLACEMENT  08/01/2009   st jude dual chamber; explanted 09/24/2013  . CARDIOVERSION N/A 09/23/2012   Procedure: CARDIOVERSION;  Surgeon: Lelon Perla, MD;  Location: [facility] ENDOSCOPY;  Service: Cardiovascular;  Laterality: N/A;  . CAROTID ENDARTERECTOMY Right 11-25-12  . CATARACT EXTRACTION W/ INTRAOCULAR LENS  IMPLANT, BILATERAL Bilateral   . CENTRAL VENOUS CATHETER INSERTION  02/17/2016   Procedure: Insertion Central Line Adult;  Surgeon: Jolaine Artist, MD;  Location: [place] CV LAB;  Service: Cardiovascular;;  . COLONOSCOPY    . CORONARY ANGIOPLASTY WITH STENT PLACEMENT  08/2012   "1"  . CORONARY ARTERY BYPASS GRAFT  05/17/1993   "CABG X4"  . Defribrillator    . ENDARTERECTOMY Right 11/25/2012   Procedure: ENDARTERECTOMY CAROTID With Dacron Patch Angioplasty;  Surgeon: Elam Dutch, MD;  Location: [place];  Service: Vascular;  Laterality:  Right;  . ESOPHAGOGASTRODUODENOSCOPY    . INGUINAL HERNIA REPAIR Bilateral    "one at a time"  . IR GENERIC HISTORICAL  02/20/2016   IR US GUIDE VASC ACCESS RIGHT 02/20/2016 Arne Cleveland, MD [facility]-INTERV RAD  . IR GENERIC HISTORICAL  02/20/2016   IR FLUORO GUIDE CV LINE RIGHT 02/20/2016 Arne Cleveland, MD [facility]INTER[facility]  . LEFT AND RIGHT HEART CATHETERIZATION WITH CORONARY ANGIOGRAM N/A 09/15/2012   Procedure: LEFT AND RIGHT HEART CATHETERIZATION WITH CORONARY ANGIOGRAM;  Surgeon: Peter M Martinique, MD;  Location: [facility] CATH LAB;  Service: Cardiovascular;  Laterality: N/A;  . PERCUTANEOUS CORONARY STENT INTERVENTION (PCI-S)  09/15/2012   Procedure: PERCUTANEOUS CORONARY STENT INTERVENTION (PCI-S);  Surgeon: Peter M Martinique, MD;  Location: [facility] CATH LAB;  Service: Cardiovascular;;  . RENAL ARTERY STENT    . RIGHT HEART CATHETERIZATION N/A 08/31/2013   Procedure: RIGHT HEART CATH;  Surgeon: Jolaine Artist, MD;  Location: [facility] CATH LAB;  Service: Cardiovascular;  Laterality: N/A;  . TEE WITHOUT CARDIOVERSION N/A 09/23/2012   Procedure: TRANSESOPHAGEAL ECHOCARDIOGRAM (TEE);  Surgeon: Lelon Perla, MD;  Location: [facility] ENDOSCOPY;  Service: Cardiovascular;  Laterality: N/A;  . TRANSLUMINAL ANGIOPLASTY       Family History  Problem Relation Age of Onset  . CAD Father   . Hypertension Father   . Heart disease Father   . CAD Brother   . Heart disease Brother   . Heart attack Brother   . Diabetes Brother   . Hypertension Mother   . Hypertension Daughter   . Vascular Disease Daughter     Right Carotid  . Colon cancer Son   . Lung cancer Sister   . Heart disease Sister   . Hypertension Sister   . Colon polyps Brother     x2     Social History   Social History  . Marital status: Married    Spouse name: N/A  . Number of children: 4  . Years of education: N/A   Occupational History  . Retired    Social History Main Topics  . Smoking status: Former Smoker    Packs/day: 1.00    Years: 50.00     Types: Cigarettes    Quit date: 05/17/1993  . Smokeless tobacco: Current User    Types: Chew  . Alcohol use No  . Drug use: No  . Sexual activity: Not Currently   Other Topics Concern  . Not on file   Social History Narrative  . No narrative on file     BP 98/60   Pulse 74   Ht '5\' 11"'$  (1.803 m)   Wt 150 lb 6.4 oz (68.2 kg)   SpO2 99%   BMI 20.98 kg/m   Physical Exam:  Well appearing 83 year old man, NAD HEENT: Unremarkable Neck:  6 cm JVD, no thyromegally Back:  No CVA tenderness Lungs:  Clear with no wheezes, rales, or rhonchi. HEART:  Regular rate rhythm, no murmurs, no rubs, no clicks Abd:  soft, positive bowel sounds, no organomegally, no rebound, no guarding Ext:  2 plus pulses, no edema, no cyanosis, no clubbing Skin:  No rashes no nodules Neuro:  CN II through XII intact, motor grossly intact  ECG - NSR with AV sequential biv pacing  DEVICE  Normal device function.  See PaceArt for details.   Assess/Plan: 1. Chronic systolic heart failure - his symptoms are well controlled. He has a low output but is stable.  2. PAF - he is maintaining NSR very nicely. Will follow. Continue amio. 3. ICD - his St. Jude BiV ICD is working normally. Will follow. 4. CAD - he denies anginal symptoms although he is quite sedentary.  Trevor Simpson.D.

## 2016-04-17 NOTE — Patient Instructions (Addendum)
Medication Instructions:  Your physician recommends that you continue on your current medications as directed. Please refer to the Current Medication list given to you today.   Labwork: None Ordered   Testing/Procedures: None Ordered   Follow-Up: Your physician wants you to follow-up in: 1 year with Dr. Lovena Le.  You will receive a reminder letter in the mail two months in advance. If you don't receive a letter, please call our office to schedule the follow-up appointment.  Remote monitoring is used to monitor your ICD from home. This monitoring reduces the number of office visits required to check your device to one time per year. It allows [place] to keep an eye on the functioning of your device to ensure it is working properly. You are scheduled for a device check from home on 07/17/16. You may send your transmission at any time that day. If you have a wireless device, the transmission will be sent automatically. After your physician reviews your transmission, you will receive a postcard with your next transmission date.     Any Other Special Instructions Will Be Listed Below (If Applicable).     If you need a refill on your cardiac medications before your next appointment, please call your pharmacy.

## 2016-04-18 ENCOUNTER — Other Ambulatory Visit (HOSPITAL_COMMUNITY): Payer: Self-pay | Admitting: Internal Medicine

## 2016-04-18 DIAGNOSIS — Z7901 Long term (current) use of anticoagulants: Secondary | ICD-10-CM | POA: Diagnosis not present

## 2016-04-18 DIAGNOSIS — Z86718 Personal history of other venous thrombosis and embolism: Secondary | ICD-10-CM | POA: Diagnosis not present

## 2016-04-18 DIAGNOSIS — I130 Hypertensive heart and chronic kidney disease with heart failure and stage 1 through stage 4 chronic kidney disease, or unspecified chronic kidney disease: Secondary | ICD-10-CM | POA: Diagnosis not present

## 2016-04-18 DIAGNOSIS — Z950 Presence of cardiac pacemaker: Secondary | ICD-10-CM | POA: Diagnosis not present

## 2016-04-18 DIAGNOSIS — N183 Chronic kidney disease, stage 3 (moderate): Secondary | ICD-10-CM | POA: Diagnosis not present

## 2016-04-18 DIAGNOSIS — K219 Gastro-esophageal reflux disease without esophagitis: Secondary | ICD-10-CM | POA: Diagnosis not present

## 2016-04-18 DIAGNOSIS — I5033 Acute on chronic diastolic (congestive) heart failure: Secondary | ICD-10-CM | POA: Diagnosis not present

## 2016-04-18 DIAGNOSIS — I5022 Chronic systolic (congestive) heart failure: Secondary | ICD-10-CM | POA: Diagnosis not present

## 2016-04-19 DIAGNOSIS — I5022 Chronic systolic (congestive) heart failure: Secondary | ICD-10-CM | POA: Diagnosis not present

## 2016-04-20 DIAGNOSIS — I5022 Chronic systolic (congestive) heart failure: Secondary | ICD-10-CM | POA: Diagnosis not present

## 2016-04-21 DIAGNOSIS — I5022 Chronic systolic (congestive) heart failure: Secondary | ICD-10-CM | POA: Diagnosis not present

## 2016-04-22 DIAGNOSIS — I130 Hypertensive heart and chronic kidney disease with heart failure and stage 1 through stage 4 chronic kidney disease, or unspecified chronic kidney disease: Secondary | ICD-10-CM | POA: Diagnosis not present

## 2016-04-22 DIAGNOSIS — I5022 Chronic systolic (congestive) heart failure: Secondary | ICD-10-CM | POA: Diagnosis not present

## 2016-04-23 ENCOUNTER — Other Ambulatory Visit (HOSPITAL_COMMUNITY): Payer: Self-pay | Admitting: Internal Medicine

## 2016-04-23 DIAGNOSIS — J449 Chronic obstructive pulmonary disease, unspecified: Secondary | ICD-10-CM | POA: Diagnosis not present

## 2016-04-23 DIAGNOSIS — I5022 Chronic systolic (congestive) heart failure: Secondary | ICD-10-CM | POA: Diagnosis not present

## 2016-04-24 DIAGNOSIS — I5022 Chronic systolic (congestive) heart failure: Secondary | ICD-10-CM | POA: Diagnosis not present

## 2016-04-25 ENCOUNTER — Ambulatory Visit: Payer: Medicare HMO | Admitting: Internal Medicine

## 2016-04-25 DIAGNOSIS — N183 Chronic kidney disease, stage 3 (moderate): Secondary | ICD-10-CM | POA: Diagnosis not present

## 2016-04-25 DIAGNOSIS — I130 Hypertensive heart and chronic kidney disease with heart failure and stage 1 through stage 4 chronic kidney disease, or unspecified chronic kidney disease: Secondary | ICD-10-CM | POA: Diagnosis not present

## 2016-04-25 DIAGNOSIS — Z86718 Personal history of other venous thrombosis and embolism: Secondary | ICD-10-CM | POA: Diagnosis not present

## 2016-04-25 DIAGNOSIS — K219 Gastro-esophageal reflux disease without esophagitis: Secondary | ICD-10-CM | POA: Diagnosis not present

## 2016-04-25 DIAGNOSIS — I5033 Acute on chronic diastolic (congestive) heart failure: Secondary | ICD-10-CM | POA: Diagnosis not present

## 2016-04-25 DIAGNOSIS — Z7901 Long term (current) use of anticoagulants: Secondary | ICD-10-CM | POA: Diagnosis not present

## 2016-04-25 DIAGNOSIS — I5022 Chronic systolic (congestive) heart failure: Secondary | ICD-10-CM | POA: Diagnosis not present

## 2016-04-25 DIAGNOSIS — I5021 Acute systolic (congestive) heart failure: Secondary | ICD-10-CM | POA: Diagnosis not present

## 2016-04-25 DIAGNOSIS — Z950 Presence of cardiac pacemaker: Secondary | ICD-10-CM | POA: Diagnosis not present

## 2016-04-26 DIAGNOSIS — I5022 Chronic systolic (congestive) heart failure: Secondary | ICD-10-CM | POA: Diagnosis not present

## 2016-04-27 DIAGNOSIS — I5022 Chronic systolic (congestive) heart failure: Secondary | ICD-10-CM | POA: Diagnosis not present

## 2016-04-28 DIAGNOSIS — I5022 Chronic systolic (congestive) heart failure: Secondary | ICD-10-CM | POA: Diagnosis not present

## 2016-04-29 DIAGNOSIS — I5022 Chronic systolic (congestive) heart failure: Secondary | ICD-10-CM | POA: Diagnosis not present

## 2016-04-30 DIAGNOSIS — I5022 Chronic systolic (congestive) heart failure: Secondary | ICD-10-CM | POA: Diagnosis not present

## 2016-05-01 DIAGNOSIS — I5022 Chronic systolic (congestive) heart failure: Secondary | ICD-10-CM | POA: Diagnosis not present

## 2016-05-02 DIAGNOSIS — Z86718 Personal history of other venous thrombosis and embolism: Secondary | ICD-10-CM | POA: Diagnosis not present

## 2016-05-02 DIAGNOSIS — K219 Gastro-esophageal reflux disease without esophagitis: Secondary | ICD-10-CM | POA: Diagnosis not present

## 2016-05-02 DIAGNOSIS — I5022 Chronic systolic (congestive) heart failure: Secondary | ICD-10-CM | POA: Diagnosis not present

## 2016-05-02 DIAGNOSIS — Z7901 Long term (current) use of anticoagulants: Secondary | ICD-10-CM | POA: Diagnosis not present

## 2016-05-02 DIAGNOSIS — N183 Chronic kidney disease, stage 3 (moderate): Secondary | ICD-10-CM | POA: Diagnosis not present

## 2016-05-02 DIAGNOSIS — I130 Hypertensive heart and chronic kidney disease with heart failure and stage 1 through stage 4 chronic kidney disease, or unspecified chronic kidney disease: Secondary | ICD-10-CM | POA: Diagnosis not present

## 2016-05-02 DIAGNOSIS — Z950 Presence of cardiac pacemaker: Secondary | ICD-10-CM | POA: Diagnosis not present

## 2016-05-02 DIAGNOSIS — I5033 Acute on chronic diastolic (congestive) heart failure: Secondary | ICD-10-CM | POA: Diagnosis not present

## 2016-05-03 DIAGNOSIS — I5022 Chronic systolic (congestive) heart failure: Secondary | ICD-10-CM | POA: Diagnosis not present

## 2016-05-04 DIAGNOSIS — I5022 Chronic systolic (congestive) heart failure: Secondary | ICD-10-CM | POA: Diagnosis not present

## 2016-05-05 DIAGNOSIS — I5022 Chronic systolic (congestive) heart failure: Secondary | ICD-10-CM | POA: Diagnosis not present

## 2016-05-06 DIAGNOSIS — I5022 Chronic systolic (congestive) heart failure: Secondary | ICD-10-CM | POA: Diagnosis not present

## 2016-05-07 DIAGNOSIS — I5022 Chronic systolic (congestive) heart failure: Secondary | ICD-10-CM | POA: Diagnosis not present

## 2016-05-08 DIAGNOSIS — I5022 Chronic systolic (congestive) heart failure: Secondary | ICD-10-CM | POA: Diagnosis not present

## 2016-05-09 DIAGNOSIS — K219 Gastro-esophageal reflux disease without esophagitis: Secondary | ICD-10-CM | POA: Diagnosis not present

## 2016-05-09 DIAGNOSIS — Z86718 Personal history of other venous thrombosis and embolism: Secondary | ICD-10-CM | POA: Diagnosis not present

## 2016-05-09 DIAGNOSIS — Z950 Presence of cardiac pacemaker: Secondary | ICD-10-CM | POA: Diagnosis not present

## 2016-05-09 DIAGNOSIS — I5033 Acute on chronic diastolic (congestive) heart failure: Secondary | ICD-10-CM | POA: Diagnosis not present

## 2016-05-09 DIAGNOSIS — I5021 Acute systolic (congestive) heart failure: Secondary | ICD-10-CM | POA: Diagnosis not present

## 2016-05-09 DIAGNOSIS — N183 Chronic kidney disease, stage 3 (moderate): Secondary | ICD-10-CM | POA: Diagnosis not present

## 2016-05-09 DIAGNOSIS — Z7901 Long term (current) use of anticoagulants: Secondary | ICD-10-CM | POA: Diagnosis not present

## 2016-05-09 DIAGNOSIS — I5022 Chronic systolic (congestive) heart failure: Secondary | ICD-10-CM | POA: Diagnosis not present

## 2016-05-09 DIAGNOSIS — I130 Hypertensive heart and chronic kidney disease with heart failure and stage 1 through stage 4 chronic kidney disease, or unspecified chronic kidney disease: Secondary | ICD-10-CM | POA: Diagnosis not present

## 2016-05-10 DIAGNOSIS — I5022 Chronic systolic (congestive) heart failure: Secondary | ICD-10-CM | POA: Diagnosis not present

## 2016-05-11 ENCOUNTER — Other Ambulatory Visit (HOSPITAL_COMMUNITY): Payer: Self-pay | Admitting: Internal Medicine

## 2016-05-11 DIAGNOSIS — I5022 Chronic systolic (congestive) heart failure: Secondary | ICD-10-CM | POA: Diagnosis not present

## 2016-05-12 DIAGNOSIS — I5022 Chronic systolic (congestive) heart failure: Secondary | ICD-10-CM | POA: Diagnosis not present

## 2016-05-13 DIAGNOSIS — I5022 Chronic systolic (congestive) heart failure: Secondary | ICD-10-CM | POA: Diagnosis not present

## 2016-05-14 ENCOUNTER — Encounter (HOSPITAL_COMMUNITY): Payer: Self-pay | Admitting: Internal Medicine

## 2016-05-14 ENCOUNTER — Ambulatory Visit (HOSPITAL_COMMUNITY)
Admission: RE | Admit: 2016-05-14 | Discharge: 2016-05-14 | Disposition: A | Discharge status: Home / Self Care | Payer: Medicare HMO | Source: Ambulatory Visit | Attending: Internal Medicine | Admitting: Internal Medicine

## 2016-05-14 VITALS — BP 120/62 | HR 59 | Wt 150.2 lb

## 2016-05-14 DIAGNOSIS — I5022 Chronic systolic (congestive) heart failure: Secondary | ICD-10-CM

## 2016-05-14 DIAGNOSIS — Z7901 Long term (current) use of anticoagulants: Secondary | ICD-10-CM | POA: Insufficient documentation

## 2016-05-14 DIAGNOSIS — I959 Hypotension, unspecified: Secondary | ICD-10-CM | POA: Diagnosis not present

## 2016-05-14 DIAGNOSIS — Z885 Allergy status to narcotic agent status: Secondary | ICD-10-CM | POA: Diagnosis not present

## 2016-05-14 DIAGNOSIS — I130 Hypertensive heart and chronic kidney disease with heart failure and stage 1 through stage 4 chronic kidney disease, or unspecified chronic kidney disease: Secondary | ICD-10-CM | POA: Insufficient documentation

## 2016-05-14 DIAGNOSIS — I480 Paroxysmal atrial fibrillation: Secondary | ICD-10-CM | POA: Diagnosis not present

## 2016-05-14 DIAGNOSIS — Z801 Family history of malignant neoplasm of trachea, bronchus and lung: Secondary | ICD-10-CM | POA: Insufficient documentation

## 2016-05-14 DIAGNOSIS — Z7902 Long term (current) use of antithrombotics/antiplatelets: Secondary | ICD-10-CM | POA: Insufficient documentation

## 2016-05-14 DIAGNOSIS — N183 Chronic kidney disease, stage 3 (moderate): Secondary | ICD-10-CM | POA: Diagnosis not present

## 2016-05-14 DIAGNOSIS — Z8249 Family history of ischemic heart disease and other diseases of the circulatory system: Secondary | ICD-10-CM | POA: Insufficient documentation

## 2016-05-14 DIAGNOSIS — I2510 Atherosclerotic heart disease of native coronary artery without angina pectoris: Secondary | ICD-10-CM | POA: Diagnosis not present

## 2016-05-14 DIAGNOSIS — Z8673 Personal history of transient ischemic attack (TIA), and cerebral infarction without residual deficits: Secondary | ICD-10-CM | POA: Diagnosis not present

## 2016-05-14 DIAGNOSIS — J449 Chronic obstructive pulmonary disease, unspecified: Secondary | ICD-10-CM | POA: Diagnosis not present

## 2016-05-14 DIAGNOSIS — I255 Ischemic cardiomyopathy: Secondary | ICD-10-CM | POA: Insufficient documentation

## 2016-05-14 DIAGNOSIS — Z833 Family history of diabetes mellitus: Secondary | ICD-10-CM | POA: Diagnosis not present

## 2016-05-14 LAB — BASIC METABOLIC PANEL
Anion gap: 10 (ref 5–15)
BUN: 27 mg/dL — AB (ref 6–20)
CALCIUM: 9.2 mg/dL (ref 8.9–10.3)
CO2: 32 mmol/L (ref 22–32)
CREATININE: 1.52 mg/dL — AB (ref 0.61–1.24)
Chloride: 96 mmol/L — ABNORMAL LOW (ref 101–111)
GFR calc Af Amer: 48 mL/min — ABNORMAL LOW (ref >60)
GFR, EST NON AFRICAN AMERICAN: 42 mL/min — AB (ref >60)
GLUCOSE: 92 mg/dL (ref 65–99)
Potassium: 3.4 mmol/L — ABNORMAL LOW (ref 3.5–5.1)
SODIUM: 138 mmol/L (ref 135–145)

## 2016-05-14 LAB — DIGOXIN LEVEL: Digoxin Level: 0.6 ng/mL — ABNORMAL LOW (ref 0.8–2.0)

## 2016-05-14 LAB — CBC
HCT: 42.7 % (ref 39.0–52.0)
Hemoglobin: 13.8 g/dL (ref 13.0–17.0)
MCH: 30.5 pg (ref 26.0–34.0)
MCHC: 32.3 g/dL (ref 30.0–36.0)
MCV: 94.3 fL (ref 78.0–100.0)
Platelets: 124 10*3/uL — ABNORMAL LOW (ref 150–400)
RBC: 4.53 MIL/uL (ref 4.22–5.81)
RDW: 15.4 % (ref 11.5–15.5)
WBC: 5.4 10*3/uL (ref 4.0–10.5)

## 2016-05-14 LAB — BRAIN NATRIURETIC PEPTIDE: B NATRIURETIC PEPTIDE 5: 1089.8 pg/mL — AB (ref 0.0–100.0)

## 2016-05-14 MED ORDER — AMIODARONE HCL 200 MG PO TABS: 200.0000 mg | ORAL_TABLET | Freq: Every day | ORAL | 3 refills | Status: DC

## 2016-05-14 NOTE — Patient Instructions (Addendum)
Increase Amiodarone to 200 mg Twice daily FOR 2 WEEKS ONLY, then decrease to 200 mg daily  Labs today  Your physician recommends that you schedule a follow-up appointment in: 3 months

## 2016-05-14 NOTE — Progress Notes (Signed)
Patient ID: Trevor Simpson, male   DOB: 19-Jan-1934, 83 y.o.   MRN: [id number]    Advanced Heart Failure Clinic Note   PCP: Dr Maudie Mercury Pulmonology: Dr Chase Caller Cardiology: Dr Lovena Le  HPI: Trevor Simpson is a 83 y.o. year old with history of  COPD, symptomatic bradycardia, PAF, HTN, PAD, CVA, chronic systolic heart failure and ischemic cardiomyopathy (EF 20-25% echo 7/16)  s/p CRT-D on 09/26/13  Admitted in 6/15 with low-output. RHC as below. Started on milrinone. In July 2015 underwent CRT-D upgrade Mean RA 11 PA 53/24  Mean PCWP 22 CI 1.5  Echo (6/15): EF 15%, moderate central Trevor, mild to moderately decreased RV systolic function.   Milrinone stopped in September 2015 due to patient preference, despite borderline co-ox.  Admitted 12/1 -> 02/21/16 with CP and profound fatigue. RHC with depressed cardiac output. Started on milrinone. Titrated up to 0.375 mcg with continued low mixed venous sat.  Improved with diureses and milrinone. Coox 67% on discharge.   He presents today for regular follow up. Still on milrinone. Says overall feeling ok. However over the weekend BP was low in the 80s and he felt bad. No ICD shocks. No fevers or chills. Just weak. No edema, orthopnea or PND. Weight stable 142-143. Today back to baseline. Able to do ADLs without too much problem. Still working out at [employer].   Corevue: AF 10 hour episode on Saturday. Overall AF burden 4.6%. No VT. Volume ok   ROS: all other systems reviewed and negative except as in HPI.   [place] 12/1: RA = 11 RV = 56/14 PA = 55/26 (38) PCW = 27 mean V wave to 40 Fick cardiac output/index = 2.4/1.3 PVR = 4.5 WU Ao sat = 99% PA sat = 41%, 43%  LHC 08/2012  Left mainstem: The left main is long with 20% stenosis distally.  Left anterior descending (LAD): The left anterior descending artery is moderately calcified in the proximal vessel. There is diffuse disease in the proximal vessel up to 30%. The first diagonal is relatively small and has  mild disease proximally. The ramus intermediate branch is a large branch that bifurcates in the mid vessel. The proximal vessel has a 90-95% stenosis.  Left circumflex (LCx): The left circumflex is relatively small. It gives rise to a single marginal branch and then continues in the AV groove. The first marginal branch has diffuse 60-70% stenosis proximally.  Right coronary artery (RCA): The right coronary is occluded proximally. There are right to right and left to right collaterals. Severe two-vessel obstructive coronary disease. Chronic total occlusion of the right coronary Successful stenting of the ramus intermediate branch with a bare-metal stent  Labs:  7/14 AST 27 ALT 21  11/14 K 3.8 Creatinine  1.41  12/14 Pro BNP 1100 4/14: K 3.9, creatinine 1.4, BUN 22 6/15: K 4.1, creatinine 1.2 7/15: K 3.7, creatinine 1.36, HCT 43.7, co-ox 66.5% 8/15: digoxin 1.2, co-ox 55%, K 4.0, creatinine 1.36 9/15: co-ox 56.8% 11/15 K 3.8, creatinine 1.25 10/17/14: K 3.3 creatinine 1.34   SH: Lives at home with wife  FH: Father, Brother - CAD HTN, DM        Sister - lung cancer     Current Outpatient Prescriptions  Medication Sig Dispense Refill  . ALPRAZolam (XANAX) 0.25 MG tablet Take 0.375 mg by mouth at bedtime.    Marland Kitchen amiodarone (PACERONE) 200 MG tablet Take 0.5 tablets (100 mg total) by mouth daily. 45 tablet 3  . apixaban (ELIQUIS) 2.5  MG TABS tablet Take 1 tablet (2.5 mg total) by mouth 2 (two) times daily. 60 tablet 3  . atorvastatin (LIPITOR) 20 MG tablet Take 20 mg by mouth daily at 6 PM.     . B Complex-C (SUPER B COMPLEX) TABS Take 1 tablet by mouth daily.     . calcium carbonate (OS-CAL) 600 MG TABS Take 600 mg by mouth daily with breakfast.     . cetirizine (ZYRTEC) 10 MG tablet Take 10 mg by mouth daily.     . digoxin (LANOXIN) 0.125 MG tablet Take 0.5 tablets (0.0625 mg total) by mouth daily. 90 tablet 3  . doxylamine, Sleep, (UNISOM) 25 MG tablet Take 25 mg by mouth at bedtime.    .  fluticasone (FLONASE) 50 MCG/ACT nasal spray Place 1 spray into both nostrils daily as needed for allergies. Reported on 04/11/2015    . furosemide (LASIX) 80 MG tablet Take 80 mg by mouth daily.    Marland Kitchen levothyroxine (SYNTHROID, LEVOTHROID) 112 MCG tablet Take 112 mcg by mouth See admin instructions. Pt takes Monday- Saturday - skips on Sundays    . magnesium gluconate (MAGONATE) 500 MG tablet Take 500 mg by mouth daily.     . milrinone (PRIMACOR) 20 MG/100 ML SOLN infusion Inject 26.775 mcg/min into the vein continuous.    . Multiple Vitamin (MULTIVITAMIN WITH MINERALS) TABS Take 1 tablet by mouth daily.    . Omega-3 Fatty Acids (FISH OIL) 1200 MG CAPS Take 1,200 mg by mouth daily.     Marland Kitchen omeprazole (PRILOSEC) 20 MG capsule Take 20 mg by mouth daily as needed (indigestion).     . potassium chloride SA (K-DUR,KLOR-CON) 20 MEQ tablet Take 1 tablet (20 mEq total) by mouth 2 (two) times daily. 60 tablet 3  . Saw Palmetto, Serenoa repens, (SAW PALMETTO PO) Take 1 capsule by mouth 2 (two) times daily.    Marland Kitchen senna (SENOKOT) 8.6 MG TABS tablet Take 1 tablet by mouth daily as needed for mild constipation.    . vitamin C (ASCORBIC ACID) 500 MG tablet Take 500 mg by mouth daily.      . nitroGLYCERIN (NITROSTAT) 0.4 MG SL tablet Place 1 tablet (0.4 mg total) under the tongue every 5 (five) minutes as needed for chest pain. (Patient not taking: Reported on 04/12/2016) 25 tablet 3   No current facility-administered medications for this encounter.      Allergies  Allergen Reactions  . Codeine     "tripped out on it"  . Morphine     "tripped out on it"    Vitals:   05/14/16 1208  BP: 120/62  Pulse: (!) 59  SpO2: 97%  Weight: 150 lb 4 oz (68.2 kg)   Wt Readings from Last 3 Encounters:  05/14/16 150 lb 4 oz (68.2 kg)  04/17/16 150 lb 6.4 oz (68.2 kg)  04/12/16 151 lb 9.6 oz (68.8 kg)     PHYSICAL EXAM: General: Elderly and chronically ill appearing. Thin.    HEENT: Normal.  Neck: Supple. JVP not  elevated on exam. Carotids 2+ bilat; no bruits. No thyromegaly or nodule noted. PICC site ok Cor: Regular.2/6 TR 2/6 HSM apex. +S3  RIJ tunneled PICC.   Lungs: Clear, normal effort.  Abdomen: soft, NT, ND, no HSM. No bruits or masses. +BS  Extremities: no cyanosis, clubbing, rash. No peripheral edema. Warm.   Neuro: Alert & oriented x 3, cranial nerves grossly intact. moves all 4 extremities w/o difficulty. Affect pleasant.   ASSESSMENT &  PLAN: 1. Chronic Systolic Heart Failure: Ischemic cardiomyopathy s/p CRT-D; Echo 6/15 with EF 15%, moderate Trevor, mild to moderately decreased RV systolic function. Echo 7/16 EF 20-25% mild Trevor/AI. RV mild dysfunction  RHC in 6/15 showed CI 1.5 so patient was started on milrinone.  Milrinone stopped in 11/2013.  Improved after CRT optimization. Milrinone resumed 12/17 - Chronic NYHA III-IIB symptoms on milrinone.Will continue - Volume status stable on exam and Corevue. Continue lasix 80 mg daily with extra 40 mg as needed - Continue milrinone 0.375 mcg/kg/min for palliation. Pt is not a candidate for advanced therapies given his age and co-morbidities. - Echo 01/19/16 LVEF 25%, RV mod reduced - Off BB with low output.  - Does not tolerate addition of ARB with hypotension. No ivabradine with HR < 70 and PAF - Reinforced fluid restriction to < 2 L daily, sodium restriction to less than 2000 mg daily, and the importance of daily weights.   2. PAF: S/P DC-CV 09/2012.   - Had recurrent AF on Saturday 2/24. Will severe symptoms. Now back in NSR - Increase amiodarone to 200 bid for 2 weeks then 200 daily - Continue Eliquis 2.5 BID. Denies bleeding.  - Recent LFTs and TFTs stable.  - No change. 3. CAD: Extensive disease burden by cath 08/2012. - No ischemic symtpoms - Continue statin. Lipids per PCP.   - No aspirin with relatively stable CAD and use of Eliquis 4. CKD stage III - Labs today 5. Hypotension - Stable. Will not uptitrate meds with low output.     Glori Bickers, MD  12:19 PM

## 2016-05-14 NOTE — Progress Notes (Signed)
Medication Samples have been provided to the patient.  Drug name: Eliquis       Strength: 2.5 mg        Qty: 5  LOT: [id number]  Exp.Date: 9/18  Dosing instructions: 1 tab Twice daily   The patient has been instructed regarding the correct time, dose, and frequency of taking this medication, including desired effects and most common side effects.   Mylik Pro 12:46 PM 05/14/2016

## 2016-05-15 DIAGNOSIS — I5022 Chronic systolic (congestive) heart failure: Secondary | ICD-10-CM | POA: Diagnosis not present

## 2016-05-16 DIAGNOSIS — I130 Hypertensive heart and chronic kidney disease with heart failure and stage 1 through stage 4 chronic kidney disease, or unspecified chronic kidney disease: Secondary | ICD-10-CM | POA: Diagnosis not present

## 2016-05-16 DIAGNOSIS — Z86718 Personal history of other venous thrombosis and embolism: Secondary | ICD-10-CM | POA: Diagnosis not present

## 2016-05-16 DIAGNOSIS — Z7901 Long term (current) use of anticoagulants: Secondary | ICD-10-CM | POA: Diagnosis not present

## 2016-05-16 DIAGNOSIS — N183 Chronic kidney disease, stage 3 (moderate): Secondary | ICD-10-CM | POA: Diagnosis not present

## 2016-05-16 DIAGNOSIS — K219 Gastro-esophageal reflux disease without esophagitis: Secondary | ICD-10-CM | POA: Diagnosis not present

## 2016-05-16 DIAGNOSIS — I5033 Acute on chronic diastolic (congestive) heart failure: Secondary | ICD-10-CM | POA: Diagnosis not present

## 2016-05-16 DIAGNOSIS — I5022 Chronic systolic (congestive) heart failure: Secondary | ICD-10-CM | POA: Diagnosis not present

## 2016-05-16 DIAGNOSIS — Z950 Presence of cardiac pacemaker: Secondary | ICD-10-CM | POA: Diagnosis not present

## 2016-05-17 ENCOUNTER — Telehealth (HOSPITAL_COMMUNITY): Payer: Self-pay | Admitting: *Deleted

## 2016-05-17 DIAGNOSIS — I10 Essential (primary) hypertension: Secondary | ICD-10-CM | POA: Diagnosis not present

## 2016-05-17 DIAGNOSIS — E119 Type 2 diabetes mellitus without complications: Secondary | ICD-10-CM | POA: Diagnosis not present

## 2016-05-17 DIAGNOSIS — Z125 Encounter for screening for malignant neoplasm of prostate: Secondary | ICD-10-CM | POA: Diagnosis not present

## 2016-05-17 DIAGNOSIS — I480 Paroxysmal atrial fibrillation: Secondary | ICD-10-CM | POA: Diagnosis not present

## 2016-05-17 DIAGNOSIS — I5022 Chronic systolic (congestive) heart failure: Secondary | ICD-10-CM | POA: Diagnosis not present

## 2016-05-17 DIAGNOSIS — I509 Heart failure, unspecified: Secondary | ICD-10-CM | POA: Diagnosis not present

## 2016-05-17 MED ORDER — POTASSIUM CHLORIDE CRYS ER 20 MEQ PO TBCR: 20.0000 meq | EXTENDED_RELEASE_TABLET | Freq: Three times a day (TID) | ORAL | 3 refills | Status: DC

## 2016-05-17 NOTE — Telephone Encounter (Signed)
Notes Recorded by Scarlette Calico, RN on 05/17/2016 at 10:31 AM EST Pt aware, agreeable and verbalizes understanding. Med list updated, pt states he does not need refill at this time. Order sent to [facility] for bmet in 2 weeks ------  Notes Recorded by Kennieth Rad, RN on 05/16/2016 at 4:48 PM EST Called and left VM asking him to call us back. VM left on [phone], tried [phone] but unable to leave VM. ------  Notes Recorded by Jolaine Artist, MD on 05/15/2016 at 11:41 AM EST Please give him and additional kcl 20 daily. Recheck BMET 2 weeks

## 2016-05-18 DIAGNOSIS — I5022 Chronic systolic (congestive) heart failure: Secondary | ICD-10-CM | POA: Diagnosis not present

## 2016-05-19 DIAGNOSIS — I5022 Chronic systolic (congestive) heart failure: Secondary | ICD-10-CM | POA: Diagnosis not present

## 2016-05-20 DIAGNOSIS — I5022 Chronic systolic (congestive) heart failure: Secondary | ICD-10-CM | POA: Diagnosis not present

## 2016-05-21 DIAGNOSIS — I5022 Chronic systolic (congestive) heart failure: Secondary | ICD-10-CM | POA: Diagnosis not present

## 2016-05-21 DIAGNOSIS — J449 Chronic obstructive pulmonary disease, unspecified: Secondary | ICD-10-CM | POA: Diagnosis not present

## 2016-05-22 ENCOUNTER — Encounter: Payer: Self-pay | Admitting: Internal Medicine

## 2016-05-22 ENCOUNTER — Ambulatory Visit (INDEPENDENT_AMBULATORY_CARE_PROVIDER_SITE_OTHER)
Admission: RE | Admit: 2016-05-22 | Discharge: 2016-05-22 | Disposition: A | Discharge status: Home / Self Care | Payer: Medicare HMO | Source: Ambulatory Visit | Attending: Internal Medicine | Admitting: Internal Medicine

## 2016-05-22 ENCOUNTER — Ambulatory Visit (INDEPENDENT_AMBULATORY_CARE_PROVIDER_SITE_OTHER): Payer: Medicare HMO | Admitting: Internal Medicine

## 2016-05-22 VITALS — BP 104/56 | HR 73 | Ht 71.0 in | Wt 150.0 lb

## 2016-05-22 DIAGNOSIS — R0602 Shortness of breath: Secondary | ICD-10-CM

## 2016-05-22 DIAGNOSIS — J439 Emphysema, unspecified: Secondary | ICD-10-CM

## 2016-05-22 DIAGNOSIS — I5022 Chronic systolic (congestive) heart failure: Secondary | ICD-10-CM | POA: Diagnosis not present

## 2016-05-22 DIAGNOSIS — R0600 Dyspnea, unspecified: Secondary | ICD-10-CM | POA: Diagnosis not present

## 2016-05-22 MED ORDER — UMECLIDINIUM BROMIDE 62.5 MCG/INH IN AEPB: 1.0000 | INHALATION_SPRAY | Freq: Every day | RESPIRATORY_TRACT | 0 refills | Status: DC

## 2016-05-22 NOTE — Assessment & Plan Note (Addendum)
Lot of recent decline is due to heart Thre is no clinical evidence of copd flare up 05/22/2016  However, I thin you should treat your copd so you could feel better I do understand cost issues  Plan  - start sample spiriva or incruse daily - get cxr to ensure dec 2017 pleural fluid is not worse  Followup - return in 1-2 months to see APP to see if inhaler working well for you

## 2016-05-22 NOTE — Assessment & Plan Note (Signed)
Need to ensure left pleural effusion from dec 2017 is not worse and contriubing to worsening symptoms  Plan cxr 2 view 05/22/2016

## 2016-05-22 NOTE — Progress Notes (Signed)
Subjective:     Patient ID: Trevor Simpson, male   DOB: 03/12/1934, 83 y.o.   MRN: [id number]  HPI    OV 10/11/2015  Chief Complaint  Patient presents with  . Follow-up    6 month Emphysema follow up - reports breathing is doing well since last ov, no new complaints   83 year old male with emphysema and associated chronic systolic heart failure with ejection fraction 20% as of 2016. Associated lean body mass and cachexia. He presents for a six-month follow-up of emphysema. He is not on any bronchodilators. He simply cannot afford them. He refuses to have them. He is on multiple cardiac medications. Therefore he wants to sacrifice taking his pulmonary medication. He is unable to tell if the Spiriva helped him in the past. He feels he is currently stable and no acute problems. His main issue appears to be exertional fatigue and some mild exertional shortness of breath. He also has some orthostatic fatigue. Has chronic hypotension according to him and it is more noticeable in the last few months. He believes this is due to his cardiac issues. He has not had any orthostatic dizziness or falls. An appointment with Dr. Haroldine Laws in cardiology on 10/24/2015 according to his history.   OV 05/22/2016  Chief Complaint  Patient presents with  . Follow-up    Pt states his breathing his breathing has worsened slightly since last OV in 09/2015. Pt c/o fatigue and lack of energy and infrequent prod cough with clear mucus. Pt denies CP/tightness.     83 ye old make with emphysema (refuses to take inhalers) with chronic systolic CHF - ef 24%. He is here with wife - reports worsening dyspnea and fatigue. In dec 2017: had admission for acute systolic CHF. Since then on milrionone. Wife and he reports improvement with milrinone but oerall fatigue and dyspnea worse than few months ago. I gave incruse lst visit for emphysema but he is not sure if tried it. Wife says ithelps him but he reserves it for "hard times"  due to cost. Due to copay issues he does not want MDI/nebs but wants samples because of worsening fatigue and poor quality of life. Nochest pain  LAst cxxr 02/17/16 with left pleural effusion small - personally visualized   has a past medical history of AAA (abdominal aortic aneurysm) ([place]); Anxiety; Arthritis; Atrial fibrillation ([place]); Automatic implantable cardioverter-defibrillator in situ; Carotid artery occlusion; Colon polyp; Congestive heart failure ([place]); Coronary artery disease; GERD (gastroesophageal reflux disease); H/O blood clots; HCAP (healthcare-associated pneumonia) (2014); Hyperlipidemia; Hypertension; Hypothyroidism; Ischemic cardiomyopathy; Myocardial infarction; Other and unspecified hyperlipidemia; Pneumonia; PVD (peripheral vascular disease) ([place]); Sinoatrial node dysfunction ([place]); Stroke [facility]) (2353); and Systolic congestive heart failure ([place]).   reports that he quit smoking about 23 years ago. His smoking use included Cigarettes. He has a 50.00 pack-year smoking history. His smokeless tobacco use includes Chew.  Past Surgical History:  Procedure Laterality Date  . ABDOMINAL AORTIC ANEURYSM REPAIR  05/17/1993  . BI-VENTRICULAR IMPLANTABLE CARDIOVERTER DEFIBRILLATOR N/A 09/24/2013   Procedure: BI-VENTRICULAR IMPLANTABLE CARDIOVERTER DEFIBRILLATOR  (CRT-D);  Surgeon: Evans Lance, MD;  Location: [facility] CATH LAB;  Service: Cardiovascular;  Laterality: N/A;  . BI-VENTRICULAR IMPLANTABLE CARDIOVERTER DEFIBRILLATOR  (CRT-D)  09/24/2013  . CARDIAC CATHETERIZATION N/A 02/17/2016   Procedure: Right Heart Cath;  Surgeon: Jolaine Artist, MD;  Location: [place] CV LAB;  Service: Cardiovascular;  Laterality: N/A;  . CARDIAC PACEMAKER PLACEMENT  08/01/2009   st jude dual chamber; explanted 09/24/2013  .  CARDIOVERSION N/A 09/23/2012   Procedure: CARDIOVERSION;  Surgeon: Lelon Perla, MD;  Location: [facility] ENDOSCOPY;  Service: Cardiovascular;  Laterality: N/A;  . CAROTID ENDARTERECTOMY Right  11-25-12  . CATARACT EXTRACTION W/ INTRAOCULAR LENS  IMPLANT, BILATERAL Bilateral   . CENTRAL VENOUS CATHETER INSERTION  02/17/2016   Procedure: Insertion Central Line Adult;  Surgeon: Jolaine Artist, MD;  Location: [place] CV LAB;  Service: Cardiovascular;;  . COLONOSCOPY    . CORONARY ANGIOPLASTY WITH STENT PLACEMENT  08/2012   "1"  . CORONARY ARTERY BYPASS GRAFT  05/17/1993   "CABG X4"  . Defribrillator    . ENDARTERECTOMY Right 11/25/2012   Procedure: ENDARTERECTOMY CAROTID With Dacron Patch Angioplasty;  Surgeon: Elam Dutch, MD;  Location: [place];  Service: Vascular;  Laterality: Right;  . ESOPHAGOGASTRODUODENOSCOPY    . INGUINAL HERNIA REPAIR Bilateral    "one at a time"  . IR GENERIC HISTORICAL  02/20/2016   IR US GUIDE VASC ACCESS RIGHT 02/20/2016 Arne Cleveland, MD [facility]-INTERV RAD  . IR GENERIC HISTORICAL  02/20/2016   IR FLUORO GUIDE CV LINE RIGHT 02/20/2016 Arne Cleveland, MD [facility]INTER[facility] RAD  . LEFT AND RIGHT HEART CATHETERIZATION WITH CORONARY ANGIOGRAM N/A 09/15/2012   Procedure: LEFT AND RIGHT HEART CATHETERIZATION WITH CORONARY ANGIOGRAM;  Surgeon: Peter M Martinique, MD;  Location: [facility] CATH LAB;  Service: Cardiovascular;  Laterality: N/A;  . PERCUTANEOUS CORONARY STENT INTERVENTION (PCI-S)  09/15/2012   Procedure: PERCUTANEOUS CORONARY STENT INTERVENTION (PCI-S);  Surgeon: Peter M Martinique, MD;  Location: [facility] CATH LAB;  Service: Cardiovascular;;  . RENAL ARTERY STENT    . RIGHT HEART CATHETERIZATION N/A 08/31/2013   Procedure: RIGHT HEART CATH;  Surgeon: Jolaine Artist, MD;  Location: [facility] CATH LAB;  Service: Cardiovascular;  Laterality: N/A;  . TEE WITHOUT CARDIOVERSION N/A 09/23/2012   Procedure: TRANSESOPHAGEAL ECHOCARDIOGRAM (TEE);  Surgeon: Lelon Perla, MD;  Location: [facility] ENDOSCOPY;  Service: Cardiovascular;  Laterality: N/A;  . TRANSLUMINAL ANGIOPLASTY      Allergies  Allergen Reactions  . Codeine     "tripped out on it"  . Morphine     "tripped out on it"     Immunization History  Administered Date(s) Administered  . Influenza, High Dose Seasonal PF 11/18/2015  . Influenza,inj,Quad PF,36+ Mos 11/26/2012, 01/06/2014, 01/10/2015  . Pneumococcal Conjugate-13 04/11/2015  . Pneumococcal Polysaccharide-23 01/18/2012    Family History  Problem Relation Age of Onset  . CAD Father   . Hypertension Father   . Heart disease Father   . CAD Brother   . Heart disease Brother   . Heart attack Brother   . Diabetes Brother   . Hypertension Mother   . Hypertension Daughter   . Vascular Disease Daughter     Right Carotid  . Colon cancer Son   . Lung cancer Sister   . Heart disease Sister   . Hypertension Sister   . Colon polyps Brother     x2     Current Outpatient Prescriptions:  .  ALPRAZolam (XANAX) 0.25 MG tablet, Take 0.375 mg by mouth at bedtime., Disp: , Rfl:  .  amiodarone (PACERONE) 200 MG tablet, Take 1 tablet (200 mg total) by mouth daily., Disp: 90 tablet, Rfl: 3 .  apixaban (ELIQUIS) 2.5 MG TABS tablet, Take 1 tablet (2.5 mg total) by mouth 2 (two) times daily., Disp: 60 tablet, Rfl: 3 .  atorvastatin (LIPITOR) 20 MG tablet, Take 20 mg by mouth daily at 6 PM. , Disp: , Rfl:  .  B Complex-C (SUPER B COMPLEX) TABS, Take 1 tablet by mouth daily. , Disp: , Rfl:  .  calcium carbonate (OS-CAL) 600 MG TABS, Take 600 mg by mouth daily with breakfast. , Disp: , Rfl:  .  cetirizine (ZYRTEC) 10 MG tablet, Take 10 mg by mouth daily. , Disp: , Rfl:  .  digoxin (LANOXIN) 0.125 MG tablet, Take 0.5 tablets (0.0625 mg total) by mouth daily., Disp: 90 tablet, Rfl: 3 .  doxylamine, Sleep, (UNISOM) 25 MG tablet, Take 25 mg by mouth at bedtime., Disp: , Rfl:  .  fluticasone (FLONASE) 50 MCG/ACT nasal spray, Place 1 spray into both nostrils daily as needed for allergies. Reported on 04/11/2015, Disp: , Rfl:  .  furosemide (LASIX) 80 MG tablet, Take 80 mg by mouth daily., Disp: , Rfl:  .  levothyroxine (SYNTHROID, LEVOTHROID) 112 MCG tablet, Take 112  mcg by mouth See admin instructions. Pt takes Monday- Saturday - skips on Sundays, Disp: , Rfl:  .  magnesium gluconate (MAGONATE) 500 MG tablet, Take 500 mg by mouth daily. , Disp: , Rfl:  .  milrinone (PRIMACOR) 20 MG/100 ML SOLN infusion, Inject 26.775 mcg/min into the vein continuous., Disp: , Rfl:  .  Multiple Vitamin (MULTIVITAMIN WITH MINERALS) TABS, Take 1 tablet by mouth daily., Disp: , Rfl:  .  nitroGLYCERIN (NITROSTAT) 0.4 MG SL tablet, Place 1 tablet (0.4 mg total) under the tongue every 5 (five) minutes as needed for chest pain., Disp: 25 tablet, Rfl: 3 .  Omega-3 Fatty Acids (FISH OIL) 1200 MG CAPS, Take 1,200 mg by mouth daily. , Disp: , Rfl:  .  omeprazole (PRILOSEC) 20 MG capsule, Take 20 mg by mouth daily as needed (indigestion). , Disp: , Rfl:  .  potassium chloride SA (K-DUR,KLOR-CON) 20 MEQ tablet, Take 1 tablet (20 mEq total) by mouth 3 (three) times daily., Disp: 60 tablet, Rfl: 3 .  Saw Palmetto, Serenoa repens, (SAW PALMETTO PO), Take 1 capsule by mouth 2 (two) times daily., Disp: , Rfl:  .  senna (SENOKOT) 8.6 MG TABS tablet, Take 1 tablet by mouth daily as needed for mild constipation., Disp: , Rfl:  .  vitamin C (ASCORBIC ACID) 500 MG tablet, Take 500 mg by mouth daily.  , Disp: , Rfl:    Review of Systems     Objective:   Physical Exam  Vitals:   05/22/16 1214  BP: (!) 104/56  Pulse: 73  SpO2: 98%  Weight: 150 lb (68 kg)  Height: 5\' 11"  (1.803 m)    Estimated body mass index is 20.92 kg/m as calculated from the following:   Height as of this encounter: 5\' 11"  (1.803 m).   Weight as of this encounter: 150 lb (68 kg).   General Appearance:  Frail, emaciated  Head:    Normocephalic, without obvious abnormality, atraumatic  Eyes:    PERRL - yes, conjunctiva/corneas - clear      Ears:    Normal external ear canals, both ears  Nose:   NG tube - no  Throat:  ETT TUBE - no , OG tube - no  Neck:   Supple,  No enlargement/tenderness/nodules     Lungs:      Clear to auscultation bilaterally, barrell chest  Chest wall:    No deformity  Heart:    S1 and S2 normal, + murmur, .  Pressors - on milrionone pump  Abdomen:     Soft, no masses, no organomegaly  Genitalia:    Not done  Rectal:   not done  Extremities:   Extremities- emaciated     Skin:   Intact in exposed areas .      Neurologic:   . Moves all 4s - yes. CAM-ICU - negative . Orientation - fully oriented. Slow gait         Assessment:       ICD-9-CM ICD-10-CM   1. Shortness of breath 786.05 R06.02 DG Chest 2 View  2. Pulmonary emphysema, unspecified emphysema type ([place]) 492.8 J43.9   3. Shortness of breath 786.05 R06.02 DG Chest 2 View       Plan:     Pulmonary emphysema (HCC) Lot of recent decline is due to heart Thre is no clinical evidence of copd flare up 05/22/2016  However, I thin you should treat your copd so you could feel better I do understand cost issues  Plan  - start sample spiriva or incruse daily - get cxr to ensure dec 2017 pleural fluid is not worse  Followup - return in 1-2 months to see APP to see if inhaler working well for you  Shortness of breath Need to ensure left pleural effusion from dec 2017 is not worse and contriubing to worsening symptoms  Plan cxr 2 view 05/22/2016     Dr. Brand Males, M.D., F.C.C.P Pulmonary and Critical Care Medicine Staff Physician [facility] Pager: [phone], If no answer or between  15:00h - 7:00h: call [phone]  05/22/2016 12:45 PM

## 2016-05-22 NOTE — Patient Instructions (Signed)
Pulmonary emphysema ([place]) Lot of recent decline is due to heart Thre is no clinical evidence of copd flare up 05/22/2016  However, I thin you should treat your copd so you could feel better I do understand cost issues  Plan  - start sample spiriva or incruse daily  Followup - return in 1-2 months to see APP to see if inhaler working well for you

## 2016-05-23 DIAGNOSIS — Z0000 Encounter for general adult medical examination without abnormal findings: Secondary | ICD-10-CM | POA: Diagnosis not present

## 2016-05-23 DIAGNOSIS — I10 Essential (primary) hypertension: Secondary | ICD-10-CM | POA: Diagnosis not present

## 2016-05-23 DIAGNOSIS — N183 Chronic kidney disease, stage 3 (moderate): Secondary | ICD-10-CM | POA: Diagnosis not present

## 2016-05-23 DIAGNOSIS — Z7689 Persons encountering health services in other specified circumstances: Secondary | ICD-10-CM | POA: Diagnosis not present

## 2016-05-23 DIAGNOSIS — Z7901 Long term (current) use of anticoagulants: Secondary | ICD-10-CM | POA: Diagnosis not present

## 2016-05-23 DIAGNOSIS — Z86718 Personal history of other venous thrombosis and embolism: Secondary | ICD-10-CM | POA: Diagnosis not present

## 2016-05-23 DIAGNOSIS — Z950 Presence of cardiac pacemaker: Secondary | ICD-10-CM | POA: Diagnosis not present

## 2016-05-23 DIAGNOSIS — E119 Type 2 diabetes mellitus without complications: Secondary | ICD-10-CM | POA: Diagnosis not present

## 2016-05-23 DIAGNOSIS — I480 Paroxysmal atrial fibrillation: Secondary | ICD-10-CM | POA: Diagnosis not present

## 2016-05-23 DIAGNOSIS — R69 Illness, unspecified: Secondary | ICD-10-CM | POA: Diagnosis not present

## 2016-05-23 DIAGNOSIS — I5022 Chronic systolic (congestive) heart failure: Secondary | ICD-10-CM | POA: Diagnosis not present

## 2016-05-23 DIAGNOSIS — K219 Gastro-esophageal reflux disease without esophagitis: Secondary | ICD-10-CM | POA: Diagnosis not present

## 2016-05-23 DIAGNOSIS — I5033 Acute on chronic diastolic (congestive) heart failure: Secondary | ICD-10-CM | POA: Diagnosis not present

## 2016-05-23 DIAGNOSIS — Z5181 Encounter for therapeutic drug level monitoring: Secondary | ICD-10-CM | POA: Diagnosis not present

## 2016-05-23 DIAGNOSIS — I130 Hypertensive heart and chronic kidney disease with heart failure and stage 1 through stage 4 chronic kidney disease, or unspecified chronic kidney disease: Secondary | ICD-10-CM | POA: Diagnosis not present

## 2016-05-24 ENCOUNTER — Telehealth: Payer: Self-pay | Admitting: Internal Medicine

## 2016-05-24 DIAGNOSIS — I5022 Chronic systolic (congestive) heart failure: Secondary | ICD-10-CM | POA: Diagnosis not present

## 2016-05-24 NOTE — Telephone Encounter (Signed)
Notes Recorded by Brand Males, MD on 05/23/2016 at 8:39 AM EST cxr has cleared compared to dec 2017. ------------------------------------------------------- Spoke with pt. He is aware of results. Nothing further was needed.

## 2016-05-25 ENCOUNTER — Other Ambulatory Visit (HOSPITAL_COMMUNITY): Payer: Self-pay | Admitting: Internal Medicine

## 2016-05-25 DIAGNOSIS — I5022 Chronic systolic (congestive) heart failure: Secondary | ICD-10-CM | POA: Diagnosis not present

## 2016-05-26 ENCOUNTER — Other Ambulatory Visit (HOSPITAL_COMMUNITY): Payer: Self-pay | Admitting: Internal Medicine

## 2016-05-26 DIAGNOSIS — I5022 Chronic systolic (congestive) heart failure: Secondary | ICD-10-CM | POA: Diagnosis not present

## 2016-05-27 DIAGNOSIS — I5022 Chronic systolic (congestive) heart failure: Secondary | ICD-10-CM | POA: Diagnosis not present

## 2016-05-28 DIAGNOSIS — I5022 Chronic systolic (congestive) heart failure: Secondary | ICD-10-CM | POA: Diagnosis not present

## 2016-05-29 DIAGNOSIS — I5022 Chronic systolic (congestive) heart failure: Secondary | ICD-10-CM | POA: Diagnosis not present

## 2016-05-30 ENCOUNTER — Other Ambulatory Visit: Payer: Self-pay | Admitting: Internal Medicine

## 2016-05-30 DIAGNOSIS — N183 Chronic kidney disease, stage 3 (moderate): Secondary | ICD-10-CM | POA: Diagnosis not present

## 2016-05-30 DIAGNOSIS — K219 Gastro-esophageal reflux disease without esophagitis: Secondary | ICD-10-CM | POA: Diagnosis not present

## 2016-05-30 DIAGNOSIS — Z86718 Personal history of other venous thrombosis and embolism: Secondary | ICD-10-CM | POA: Diagnosis not present

## 2016-05-30 DIAGNOSIS — I5022 Chronic systolic (congestive) heart failure: Secondary | ICD-10-CM | POA: Diagnosis not present

## 2016-05-30 DIAGNOSIS — I5033 Acute on chronic diastolic (congestive) heart failure: Secondary | ICD-10-CM | POA: Diagnosis not present

## 2016-05-30 DIAGNOSIS — Z7901 Long term (current) use of anticoagulants: Secondary | ICD-10-CM | POA: Diagnosis not present

## 2016-05-30 DIAGNOSIS — I130 Hypertensive heart and chronic kidney disease with heart failure and stage 1 through stage 4 chronic kidney disease, or unspecified chronic kidney disease: Secondary | ICD-10-CM | POA: Diagnosis not present

## 2016-05-30 DIAGNOSIS — Z950 Presence of cardiac pacemaker: Secondary | ICD-10-CM | POA: Diagnosis not present

## 2016-05-31 DIAGNOSIS — I5022 Chronic systolic (congestive) heart failure: Secondary | ICD-10-CM | POA: Diagnosis not present

## 2016-06-01 DIAGNOSIS — I5022 Chronic systolic (congestive) heart failure: Secondary | ICD-10-CM | POA: Diagnosis not present

## 2016-06-02 DIAGNOSIS — I5022 Chronic systolic (congestive) heart failure: Secondary | ICD-10-CM | POA: Diagnosis not present

## 2016-06-03 DIAGNOSIS — I5022 Chronic systolic (congestive) heart failure: Secondary | ICD-10-CM | POA: Diagnosis not present

## 2016-06-04 DIAGNOSIS — I5022 Chronic systolic (congestive) heart failure: Secondary | ICD-10-CM | POA: Diagnosis not present

## 2016-06-05 DIAGNOSIS — I5022 Chronic systolic (congestive) heart failure: Secondary | ICD-10-CM | POA: Diagnosis not present

## 2016-06-06 DIAGNOSIS — Z86718 Personal history of other venous thrombosis and embolism: Secondary | ICD-10-CM | POA: Diagnosis not present

## 2016-06-06 DIAGNOSIS — I5033 Acute on chronic diastolic (congestive) heart failure: Secondary | ICD-10-CM | POA: Diagnosis not present

## 2016-06-06 DIAGNOSIS — Z7901 Long term (current) use of anticoagulants: Secondary | ICD-10-CM | POA: Diagnosis not present

## 2016-06-06 DIAGNOSIS — Z950 Presence of cardiac pacemaker: Secondary | ICD-10-CM | POA: Diagnosis not present

## 2016-06-06 DIAGNOSIS — I130 Hypertensive heart and chronic kidney disease with heart failure and stage 1 through stage 4 chronic kidney disease, or unspecified chronic kidney disease: Secondary | ICD-10-CM | POA: Diagnosis not present

## 2016-06-06 DIAGNOSIS — K219 Gastro-esophageal reflux disease without esophagitis: Secondary | ICD-10-CM | POA: Diagnosis not present

## 2016-06-06 DIAGNOSIS — I5022 Chronic systolic (congestive) heart failure: Secondary | ICD-10-CM | POA: Diagnosis not present

## 2016-06-06 DIAGNOSIS — I5021 Acute systolic (congestive) heart failure: Secondary | ICD-10-CM | POA: Diagnosis not present

## 2016-06-06 DIAGNOSIS — N183 Chronic kidney disease, stage 3 (moderate): Secondary | ICD-10-CM | POA: Diagnosis not present

## 2016-06-07 DIAGNOSIS — I5022 Chronic systolic (congestive) heart failure: Secondary | ICD-10-CM | POA: Diagnosis not present

## 2016-06-08 DIAGNOSIS — I5022 Chronic systolic (congestive) heart failure: Secondary | ICD-10-CM | POA: Diagnosis not present

## 2016-06-09 DIAGNOSIS — I5022 Chronic systolic (congestive) heart failure: Secondary | ICD-10-CM | POA: Diagnosis not present

## 2016-06-10 DIAGNOSIS — I5022 Chronic systolic (congestive) heart failure: Secondary | ICD-10-CM | POA: Diagnosis not present

## 2016-06-11 ENCOUNTER — Other Ambulatory Visit (HOSPITAL_COMMUNITY): Payer: Self-pay | Admitting: Internal Medicine

## 2016-06-11 DIAGNOSIS — I5022 Chronic systolic (congestive) heart failure: Secondary | ICD-10-CM | POA: Diagnosis not present

## 2016-06-12 DIAGNOSIS — I5022 Chronic systolic (congestive) heart failure: Secondary | ICD-10-CM | POA: Diagnosis not present

## 2016-06-13 DIAGNOSIS — Z86718 Personal history of other venous thrombosis and embolism: Secondary | ICD-10-CM | POA: Diagnosis not present

## 2016-06-13 DIAGNOSIS — N183 Chronic kidney disease, stage 3 (moderate): Secondary | ICD-10-CM | POA: Diagnosis not present

## 2016-06-13 DIAGNOSIS — I130 Hypertensive heart and chronic kidney disease with heart failure and stage 1 through stage 4 chronic kidney disease, or unspecified chronic kidney disease: Secondary | ICD-10-CM | POA: Diagnosis not present

## 2016-06-13 DIAGNOSIS — I5022 Chronic systolic (congestive) heart failure: Secondary | ICD-10-CM | POA: Diagnosis not present

## 2016-06-13 DIAGNOSIS — K219 Gastro-esophageal reflux disease without esophagitis: Secondary | ICD-10-CM | POA: Diagnosis not present

## 2016-06-13 DIAGNOSIS — I5033 Acute on chronic diastolic (congestive) heart failure: Secondary | ICD-10-CM | POA: Diagnosis not present

## 2016-06-13 DIAGNOSIS — Z950 Presence of cardiac pacemaker: Secondary | ICD-10-CM | POA: Diagnosis not present

## 2016-06-13 DIAGNOSIS — Z7901 Long term (current) use of anticoagulants: Secondary | ICD-10-CM | POA: Diagnosis not present

## 2016-06-14 DIAGNOSIS — I5022 Chronic systolic (congestive) heart failure: Secondary | ICD-10-CM | POA: Diagnosis not present

## 2016-06-15 DIAGNOSIS — I5022 Chronic systolic (congestive) heart failure: Secondary | ICD-10-CM | POA: Diagnosis not present

## 2016-06-16 DIAGNOSIS — I5022 Chronic systolic (congestive) heart failure: Secondary | ICD-10-CM | POA: Diagnosis not present

## 2016-06-17 DIAGNOSIS — I5022 Chronic systolic (congestive) heart failure: Secondary | ICD-10-CM | POA: Diagnosis not present

## 2016-06-18 DIAGNOSIS — I5022 Chronic systolic (congestive) heart failure: Secondary | ICD-10-CM | POA: Diagnosis not present

## 2016-06-19 DIAGNOSIS — I5022 Chronic systolic (congestive) heart failure: Secondary | ICD-10-CM | POA: Diagnosis not present

## 2016-06-20 DIAGNOSIS — I5033 Acute on chronic diastolic (congestive) heart failure: Secondary | ICD-10-CM | POA: Diagnosis not present

## 2016-06-20 DIAGNOSIS — Z950 Presence of cardiac pacemaker: Secondary | ICD-10-CM | POA: Diagnosis not present

## 2016-06-20 DIAGNOSIS — Z86718 Personal history of other venous thrombosis and embolism: Secondary | ICD-10-CM | POA: Diagnosis not present

## 2016-06-20 DIAGNOSIS — Z7901 Long term (current) use of anticoagulants: Secondary | ICD-10-CM | POA: Diagnosis not present

## 2016-06-20 DIAGNOSIS — I5021 Acute systolic (congestive) heart failure: Secondary | ICD-10-CM | POA: Diagnosis not present

## 2016-06-20 DIAGNOSIS — N183 Chronic kidney disease, stage 3 (moderate): Secondary | ICD-10-CM | POA: Diagnosis not present

## 2016-06-20 DIAGNOSIS — I130 Hypertensive heart and chronic kidney disease with heart failure and stage 1 through stage 4 chronic kidney disease, or unspecified chronic kidney disease: Secondary | ICD-10-CM | POA: Diagnosis not present

## 2016-06-20 DIAGNOSIS — I5022 Chronic systolic (congestive) heart failure: Secondary | ICD-10-CM | POA: Diagnosis not present

## 2016-06-20 DIAGNOSIS — K219 Gastro-esophageal reflux disease without esophagitis: Secondary | ICD-10-CM | POA: Diagnosis not present

## 2016-06-21 ENCOUNTER — Telehealth (HOSPITAL_COMMUNITY): Payer: Self-pay | Admitting: *Deleted

## 2016-06-21 ENCOUNTER — Other Ambulatory Visit: Payer: Self-pay | Admitting: Dermatology

## 2016-06-21 DIAGNOSIS — I5022 Chronic systolic (congestive) heart failure: Secondary | ICD-10-CM | POA: Diagnosis not present

## 2016-06-21 DIAGNOSIS — I480 Paroxysmal atrial fibrillation: Secondary | ICD-10-CM | POA: Diagnosis not present

## 2016-06-21 DIAGNOSIS — L570 Actinic keratosis: Secondary | ICD-10-CM | POA: Diagnosis not present

## 2016-06-21 DIAGNOSIS — J449 Chronic obstructive pulmonary disease, unspecified: Secondary | ICD-10-CM | POA: Diagnosis not present

## 2016-06-21 DIAGNOSIS — D044 Carcinoma in situ of skin of scalp and neck: Secondary | ICD-10-CM | POA: Diagnosis not present

## 2016-06-21 DIAGNOSIS — I5033 Acute on chronic diastolic (congestive) heart failure: Secondary | ICD-10-CM | POA: Diagnosis not present

## 2016-06-21 DIAGNOSIS — D0439 Carcinoma in situ of skin of other parts of face: Secondary | ICD-10-CM | POA: Diagnosis not present

## 2016-06-21 DIAGNOSIS — I130 Hypertensive heart and chronic kidney disease with heart failure and stage 1 through stage 4 chronic kidney disease, or unspecified chronic kidney disease: Secondary | ICD-10-CM | POA: Diagnosis not present

## 2016-06-21 DIAGNOSIS — N183 Chronic kidney disease, stage 3 (moderate): Secondary | ICD-10-CM | POA: Diagnosis not present

## 2016-06-21 NOTE — Telephone Encounter (Signed)
Received form from Dr Arlyn Leak, DDS office pt needs clearance for dental treatment (extractions).  Per Dr Haroldine Laws, pt will not need antibiotics prior, does not need to stop anticoags, NO GENERAL ANESTHESIA, note faxed back to their office at [phone]

## 2016-06-22 DIAGNOSIS — I5022 Chronic systolic (congestive) heart failure: Secondary | ICD-10-CM | POA: Diagnosis not present

## 2016-06-23 DIAGNOSIS — I5022 Chronic systolic (congestive) heart failure: Secondary | ICD-10-CM | POA: Diagnosis not present

## 2016-06-24 DIAGNOSIS — I5022 Chronic systolic (congestive) heart failure: Secondary | ICD-10-CM | POA: Diagnosis not present

## 2016-06-25 DIAGNOSIS — I5022 Chronic systolic (congestive) heart failure: Secondary | ICD-10-CM | POA: Diagnosis not present

## 2016-06-26 DIAGNOSIS — I5022 Chronic systolic (congestive) heart failure: Secondary | ICD-10-CM | POA: Diagnosis not present

## 2016-06-27 DIAGNOSIS — Z86718 Personal history of other venous thrombosis and embolism: Secondary | ICD-10-CM | POA: Diagnosis not present

## 2016-06-27 DIAGNOSIS — K219 Gastro-esophageal reflux disease without esophagitis: Secondary | ICD-10-CM | POA: Diagnosis not present

## 2016-06-27 DIAGNOSIS — I5033 Acute on chronic diastolic (congestive) heart failure: Secondary | ICD-10-CM | POA: Diagnosis not present

## 2016-06-27 DIAGNOSIS — Z7901 Long term (current) use of anticoagulants: Secondary | ICD-10-CM | POA: Diagnosis not present

## 2016-06-27 DIAGNOSIS — Z950 Presence of cardiac pacemaker: Secondary | ICD-10-CM | POA: Diagnosis not present

## 2016-06-27 DIAGNOSIS — I5022 Chronic systolic (congestive) heart failure: Secondary | ICD-10-CM | POA: Diagnosis not present

## 2016-06-27 DIAGNOSIS — N183 Chronic kidney disease, stage 3 (moderate): Secondary | ICD-10-CM | POA: Diagnosis not present

## 2016-06-27 DIAGNOSIS — I130 Hypertensive heart and chronic kidney disease with heart failure and stage 1 through stage 4 chronic kidney disease, or unspecified chronic kidney disease: Secondary | ICD-10-CM | POA: Diagnosis not present

## 2016-06-28 DIAGNOSIS — I5022 Chronic systolic (congestive) heart failure: Secondary | ICD-10-CM | POA: Diagnosis not present

## 2016-06-29 DIAGNOSIS — I5022 Chronic systolic (congestive) heart failure: Secondary | ICD-10-CM | POA: Diagnosis not present

## 2016-06-30 DIAGNOSIS — I5022 Chronic systolic (congestive) heart failure: Secondary | ICD-10-CM | POA: Diagnosis not present

## 2016-07-01 DIAGNOSIS — I5022 Chronic systolic (congestive) heart failure: Secondary | ICD-10-CM | POA: Diagnosis not present

## 2016-07-02 DIAGNOSIS — I5022 Chronic systolic (congestive) heart failure: Secondary | ICD-10-CM | POA: Diagnosis not present

## 2016-07-03 DIAGNOSIS — I5022 Chronic systolic (congestive) heart failure: Secondary | ICD-10-CM | POA: Diagnosis not present

## 2016-07-04 DIAGNOSIS — I130 Hypertensive heart and chronic kidney disease with heart failure and stage 1 through stage 4 chronic kidney disease, or unspecified chronic kidney disease: Secondary | ICD-10-CM | POA: Diagnosis not present

## 2016-07-04 DIAGNOSIS — Z7901 Long term (current) use of anticoagulants: Secondary | ICD-10-CM | POA: Diagnosis not present

## 2016-07-04 DIAGNOSIS — I5021 Acute systolic (congestive) heart failure: Secondary | ICD-10-CM | POA: Diagnosis not present

## 2016-07-04 DIAGNOSIS — Z950 Presence of cardiac pacemaker: Secondary | ICD-10-CM | POA: Diagnosis not present

## 2016-07-04 DIAGNOSIS — I5033 Acute on chronic diastolic (congestive) heart failure: Secondary | ICD-10-CM | POA: Diagnosis not present

## 2016-07-04 DIAGNOSIS — Z86718 Personal history of other venous thrombosis and embolism: Secondary | ICD-10-CM | POA: Diagnosis not present

## 2016-07-04 DIAGNOSIS — N183 Chronic kidney disease, stage 3 (moderate): Secondary | ICD-10-CM | POA: Diagnosis not present

## 2016-07-04 DIAGNOSIS — K219 Gastro-esophageal reflux disease without esophagitis: Secondary | ICD-10-CM | POA: Diagnosis not present

## 2016-07-04 DIAGNOSIS — I5022 Chronic systolic (congestive) heart failure: Secondary | ICD-10-CM | POA: Diagnosis not present

## 2016-07-05 ENCOUNTER — Telehealth (HOSPITAL_COMMUNITY): Payer: Self-pay | Admitting: Cardiology

## 2016-07-05 DIAGNOSIS — I5022 Chronic systolic (congestive) heart failure: Secondary | ICD-10-CM | POA: Diagnosis not present

## 2016-07-05 MED ORDER — POTASSIUM CHLORIDE CRYS ER 20 MEQ PO TBCR: 40.0000 meq | EXTENDED_RELEASE_TABLET | Freq: Two times a day (BID) | ORAL | 3 refills | Status: DC

## 2016-07-05 NOTE — Telephone Encounter (Signed)
Abnormal labs received from [place] drawn 4/18 k 3.6 Cr 1.48  Per Vo Rebecca Eaton Increase potassium to 40 meQ twice a day   pts wife aware and voiced understanding

## 2016-07-06 DIAGNOSIS — I5022 Chronic systolic (congestive) heart failure: Secondary | ICD-10-CM | POA: Diagnosis not present

## 2016-07-07 DIAGNOSIS — I5022 Chronic systolic (congestive) heart failure: Secondary | ICD-10-CM | POA: Diagnosis not present

## 2016-07-08 DIAGNOSIS — I5022 Chronic systolic (congestive) heart failure: Secondary | ICD-10-CM | POA: Diagnosis not present

## 2016-07-09 DIAGNOSIS — I5022 Chronic systolic (congestive) heart failure: Secondary | ICD-10-CM | POA: Diagnosis not present

## 2016-07-10 ENCOUNTER — Encounter: Payer: Self-pay | Admitting: Family

## 2016-07-10 DIAGNOSIS — I5022 Chronic systolic (congestive) heart failure: Secondary | ICD-10-CM | POA: Diagnosis not present

## 2016-07-11 ENCOUNTER — Telehealth (HOSPITAL_COMMUNITY): Payer: Self-pay

## 2016-07-11 DIAGNOSIS — Z86718 Personal history of other venous thrombosis and embolism: Secondary | ICD-10-CM | POA: Diagnosis not present

## 2016-07-11 DIAGNOSIS — Z7901 Long term (current) use of anticoagulants: Secondary | ICD-10-CM | POA: Diagnosis not present

## 2016-07-11 DIAGNOSIS — I130 Hypertensive heart and chronic kidney disease with heart failure and stage 1 through stage 4 chronic kidney disease, or unspecified chronic kidney disease: Secondary | ICD-10-CM | POA: Diagnosis not present

## 2016-07-11 DIAGNOSIS — I5022 Chronic systolic (congestive) heart failure: Secondary | ICD-10-CM | POA: Diagnosis not present

## 2016-07-11 DIAGNOSIS — N183 Chronic kidney disease, stage 3 (moderate): Secondary | ICD-10-CM | POA: Diagnosis not present

## 2016-07-11 DIAGNOSIS — I5033 Acute on chronic diastolic (congestive) heart failure: Secondary | ICD-10-CM | POA: Diagnosis not present

## 2016-07-11 DIAGNOSIS — Z950 Presence of cardiac pacemaker: Secondary | ICD-10-CM | POA: Diagnosis not present

## 2016-07-11 DIAGNOSIS — K219 Gastro-esophageal reflux disease without esophagitis: Secondary | ICD-10-CM | POA: Diagnosis not present

## 2016-07-11 NOTE — Telephone Encounter (Signed)
Musician with [facility] called to verify potassium dose after recent change. Most recent documentation in our records show we increased potassium to 40 meq BID. Verbally verified with RN over the phone.  Renee Pain, RN

## 2016-07-12 DIAGNOSIS — I5022 Chronic systolic (congestive) heart failure: Secondary | ICD-10-CM | POA: Diagnosis not present

## 2016-07-13 ENCOUNTER — Other Ambulatory Visit (HOSPITAL_COMMUNITY): Payer: Self-pay | Admitting: Internal Medicine

## 2016-07-13 DIAGNOSIS — I5022 Chronic systolic (congestive) heart failure: Secondary | ICD-10-CM | POA: Diagnosis not present

## 2016-07-14 DIAGNOSIS — I5022 Chronic systolic (congestive) heart failure: Secondary | ICD-10-CM | POA: Diagnosis not present

## 2016-07-15 DIAGNOSIS — I5022 Chronic systolic (congestive) heart failure: Secondary | ICD-10-CM | POA: Diagnosis not present

## 2016-07-16 ENCOUNTER — Telehealth (HOSPITAL_COMMUNITY): Payer: Self-pay | Admitting: *Deleted

## 2016-07-16 DIAGNOSIS — I5022 Chronic systolic (congestive) heart failure: Secondary | ICD-10-CM | POA: Diagnosis not present

## 2016-07-16 NOTE — Telephone Encounter (Signed)
Pt called to request samples of Eliquis. Samples left a front desk for pt to p/u  Medication Samples have been provided to the patient.  Drug name: Elqius       Strength: 2.5 mg        Qty: 3  LOT: [id number]  Exp.Date: 2/19  Dosing instructions: 1 tab Twice daily   The patient has been instructed regarding the correct time, dose, and frequency of taking this medication, including desired effects and most common side effects.   Trevor Simpson 11:36 AM 07/16/2016

## 2016-07-17 ENCOUNTER — Ambulatory Visit (INDEPENDENT_AMBULATORY_CARE_PROVIDER_SITE_OTHER): Payer: Medicare HMO | Admitting: Internal Medicine

## 2016-07-17 ENCOUNTER — Ambulatory Visit (HOSPITAL_COMMUNITY)
Admission: RE | Admit: 2016-07-17 | Discharge: 2016-07-17 | Disposition: A | Discharge status: Home / Self Care | Payer: Medicare HMO | Source: Ambulatory Visit | Attending: Family | Admitting: Family

## 2016-07-17 ENCOUNTER — Ambulatory Visit (INDEPENDENT_AMBULATORY_CARE_PROVIDER_SITE_OTHER): Payer: Medicare HMO | Admitting: Family

## 2016-07-17 ENCOUNTER — Ambulatory Visit (INDEPENDENT_AMBULATORY_CARE_PROVIDER_SITE_OTHER)
Admission: RE | Admit: 2016-07-17 | Discharge: 2016-07-17 | Disposition: A | Discharge status: Home / Self Care | Payer: Medicare HMO | Source: Ambulatory Visit | Attending: Family | Admitting: Family

## 2016-07-17 ENCOUNTER — Encounter: Payer: Self-pay | Admitting: Family

## 2016-07-17 ENCOUNTER — Encounter: Payer: Self-pay | Admitting: Internal Medicine

## 2016-07-17 ENCOUNTER — Ambulatory Visit (INDEPENDENT_AMBULATORY_CARE_PROVIDER_SITE_OTHER): Payer: Medicare HMO | Admitting: *Deleted

## 2016-07-17 VITALS — BP 102/60 | HR 69 | Temp 97.0°F | Resp 16 | Ht 69.0 in | Wt 146.0 lb

## 2016-07-17 DIAGNOSIS — I6521 Occlusion and stenosis of right carotid artery: Secondary | ICD-10-CM | POA: Diagnosis not present

## 2016-07-17 DIAGNOSIS — L609 Nail disorder, unspecified: Secondary | ICD-10-CM | POA: Diagnosis not present

## 2016-07-17 DIAGNOSIS — B351 Tinea unguium: Secondary | ICD-10-CM

## 2016-07-17 DIAGNOSIS — I5022 Chronic systolic (congestive) heart failure: Secondary | ICD-10-CM | POA: Diagnosis not present

## 2016-07-17 DIAGNOSIS — J439 Emphysema, unspecified: Secondary | ICD-10-CM

## 2016-07-17 DIAGNOSIS — I739 Peripheral vascular disease, unspecified: Secondary | ICD-10-CM

## 2016-07-17 DIAGNOSIS — I6523 Occlusion and stenosis of bilateral carotid arteries: Secondary | ICD-10-CM | POA: Diagnosis not present

## 2016-07-17 DIAGNOSIS — I779 Disorder of arteries and arterioles, unspecified: Secondary | ICD-10-CM | POA: Diagnosis not present

## 2016-07-17 DIAGNOSIS — I255 Ischemic cardiomyopathy: Secondary | ICD-10-CM

## 2016-07-17 DIAGNOSIS — I714 Abdominal aortic aneurysm, without rupture, unspecified: Secondary | ICD-10-CM

## 2016-07-17 DIAGNOSIS — I6522 Occlusion and stenosis of left carotid artery: Secondary | ICD-10-CM | POA: Diagnosis not present

## 2016-07-17 DIAGNOSIS — Z9889 Other specified postprocedural states: Secondary | ICD-10-CM | POA: Diagnosis not present

## 2016-07-17 LAB — VAS US CAROTID
LEFT ECA DIAS: -13 cm/s
Left CCA dist dias: 16 cm/s
Left CCA dist sys: 128 cm/s
Left CCA prox dias: 11 cm/s
Left CCA prox sys: 97 cm/s
Left ICA dist dias: -23 cm/s
Left ICA dist sys: -97 cm/s
Left ICA prox dias: -23 cm/s
Left ICA prox sys: -127 cm/s
RIGHT CCA MID DIAS: 8 cm/s
Right CCA prox dias: 3 cm/s
Right CCA prox sys: 60 cm/s
Right cca dist sys: -68 cm/s

## 2016-07-17 MED ORDER — UMECLIDINIUM BROMIDE 62.5 MCG/INH IN AEPB: 1.0000 | INHALATION_SPRAY | Freq: Every day | RESPIRATORY_TRACT | 0 refills | Status: DC

## 2016-07-17 NOTE — Patient Instructions (Signed)
Stroke Prevention Some medical conditions and behaviors are associated with an increased chance of having a stroke. You may prevent a stroke by making healthy choices and managing medical conditions. How can I reduce my risk of having a stroke?  Stay physically active. Get at least 30 minutes of activity on most or all days.  Do not smoke. It may also be helpful to avoid exposure to secondhand smoke.  Limit alcohol use. Moderate alcohol use is considered to be:  No more than 2 drinks per day for men.  No more than 1 drink per day for nonpregnant women.  Eat healthy foods. This involves:  Eating 5 or more servings of fruits and vegetables a day.  Making dietary changes that address high blood pressure (hypertension), high cholesterol, diabetes, or obesity.  Manage your cholesterol levels.  Making food choices that are high in fiber and low in saturated fat, trans fat, and cholesterol may control cholesterol levels.  Take any prescribed medicines to control cholesterol as directed by your health care provider.  Manage your diabetes.  Controlling your carbohydrate and sugar intake is recommended to manage diabetes.  Take any prescribed medicines to control diabetes as directed by your health care provider.  Control your hypertension.  Making food choices that are low in salt (sodium), saturated fat, trans fat, and cholesterol is recommended to manage hypertension.  Ask your health care provider if you need treatment to lower your blood pressure. Take any prescribed medicines to control hypertension as directed by your health care provider.  If you are 18-39 years of age, have your blood pressure checked every 3-5 years. If you are 40 years of age or older, have your blood pressure checked every year.  Maintain a healthy weight.  Reducing calorie intake and making food choices that are low in sodium, saturated fat, trans fat, and cholesterol are recommended to manage  weight.  Stop drug abuse.  Avoid taking birth control pills.  Talk to your health care provider about the risks of taking birth control pills if you are over 35 years old, smoke, get migraines, or have ever had a blood clot.  Get evaluated for sleep disorders (sleep apnea).  Talk to your health care provider about getting a sleep evaluation if you snore a lot or have excessive sleepiness.  Take medicines only as directed by your health care provider.  For some people, aspirin or blood thinners (anticoagulants) are helpful in reducing the risk of forming abnormal blood clots that can lead to stroke. If you have the irregular heart rhythm of atrial fibrillation, you should be on a blood thinner unless there is a good reason you cannot take them.  Understand all your medicine instructions.  Make sure that other conditions (such as anemia or atherosclerosis) are addressed. Get help right away if:  You have sudden weakness or numbness of the face, arm, or leg, especially on one side of the body.  Your face or eyelid droops to one side.  You have sudden confusion.  You have trouble speaking (aphasia) or understanding.  You have sudden trouble seeing in one or both eyes.  You have sudden trouble walking.  You have dizziness.  You have a loss of balance or coordination.  You have a sudden, severe headache with no known cause.  You have new chest pain or an irregular heartbeat. Any of these symptoms may represent a serious problem that is an emergency. Do not wait to see if the symptoms will go away.   Get medical help at once. Call your local emergency services (911 in [place] Do not drive yourself to the hospital.  This information is not intended to replace advice given to you by your health care provider. Make sure you discuss any questions you have with your health care provider. Document Released: 04/12/2004 Document Revised: 08/11/2015 Document Reviewed: 09/05/2012 Elsevier  Interactive Patient Education  2017 Elsevier Inc.      Peripheral Vascular Disease Peripheral vascular disease (PVD) is a disease of the blood vessels that are not part of your heart and brain. A simple term for PVD is poor circulation. In most cases, PVD narrows the blood vessels that carry blood from your heart to the rest of your body. This can result in a decreased supply of blood to your arms, legs, and internal organs, like your stomach or kidneys. However, it most often affects a person's lower legs and feet. There are two types of PVD.  Organic PVD. This is the more common type. It is caused by damage to the structure of blood vessels.  Functional PVD. This is caused by conditions that make blood vessels contract and tighten (spasm). Without treatment, PVD tends to get worse over time. PVD can also lead to acute ischemic limb. This is when an arm or limb suddenly has trouble getting enough blood. This is a medical emergency. Follow these instructions at home:  Take medicines only as told by your doctor.  Do not use any tobacco products, including cigarettes, chewing tobacco, or electronic cigarettes. If you need help quitting, ask your doctor.  Lose weight if you are overweight, and maintain a healthy weight as told by your doctor.  Eat a diet that is low in fat and cholesterol. If you need help, ask your doctor.  Exercise regularly. Ask your doctor for some good activities for you.  Take good care of your feet.  Wear comfortable shoes that fit well.  Check your feet often for any cuts or sores. Contact a doctor if:  You have cramps in your legs while walking.  You have leg pain when you are at rest.  You have coldness in a leg or foot.  Your skin changes.  You are unable to get or have an erection (erectile dysfunction).  You have cuts or sores on your feet that are not healing. Get help right away if:  Your arm or leg turns cold and blue.  Your arms or legs  become red, warm, swollen, painful, or numb.  You have chest pain or trouble breathing.  You suddenly have weakness in your face, arm, or leg.  You become very confused or you cannot speak.  You suddenly have a very bad headache.  You suddenly cannot see. This information is not intended to replace advice given to you by your health care provider. Make sure you discuss any questions you have with your health care provider. Document Released: 05/30/2009 Document Revised: 08/11/2015 Document Reviewed: 08/13/2013 Elsevier Interactive Patient Education  2017 Elsevier Inc.  

## 2016-07-17 NOTE — Patient Instructions (Signed)
Pulmonary emphysema ([place]) Glad incruse is helping you.   - This is not  A steroid. And Does not rev up the heart  - it is beneficial in opening up lungs and preventing flare up Copd is stable  Plan - continue incruse daily - albuterol as needed - high dose flu shot in fall  followup  6-9 months or sooner if nededed

## 2016-07-17 NOTE — Progress Notes (Signed)
Subjective:     Patient ID: Trevor Simpson, male   DOB: [birth year], 83 y.o.   MRN: [id number]  HPI     OV 10/11/2015  Chief Complaint  Patient presents with  . Follow-up    6 month Emphysema follow up - reports breathing is doing well since last ov, no new complaints   83 year old male with emphysema and associated chronic systolic heart failure with ejection fraction 20% as of 2016. Associated lean body mass and cachexia. He presents for a six-month follow-up of emphysema. He is not on any bronchodilators. He simply cannot afford them. He refuses to have them. He is on multiple cardiac medications. Therefore he wants to sacrifice taking his pulmonary medication. He is unable to tell if the Spiriva helped him in the past. He feels he is currently stable and no acute problems. His main issue appears to be exertional fatigue and some mild exertional shortness of breath. He also has some orthostatic fatigue. Has chronic hypotension according to him and it is more noticeable in the last few months. He believes this is due to his cardiac issues. He has not had any orthostatic dizziness or falls. An appointment with Dr. Haroldine Laws in cardiology on 10/24/2015 according to his history.   OV 05/22/2016  Chief Complaint  Patient presents with  . Follow-up    Pt states his breathing his breathing has worsened slightly since last OV in 09/2015. Pt c/o fatigue and lack of energy and infrequent prod cough with clear mucus. Pt denies CP/tightness.     11 ye old make with emphysema (refuses to take inhalers) with chronic systolic CHF - ef 69%. He is here with wife - reports worsening dyspnea and fatigue. In dec 2017: had admission for acute systolic CHF. Since then on milrionone. Wife and he reports improvement with milrinone but oerall fatigue and dyspnea worse than few months ago. I gave incruse lst visit for emphysema but he is not sure if tried it. Wife says ithelps him but he reserves it for "hard times"  due to cost. Due to copay issues he does not want MDI/nebs but wants samples because of worsening fatigue and poor quality of life. Nochest pain  LAst cxxr 02/17/16 with left pleural effusion small - personally visualized   OV 07/17/2016  Chief Complaint  Patient presents with  . Follow-up    Pt states the incruse has slightly helped since last OV. Pt denies cough, CP/tightness, f/c/s.    83 year old male with cardiomyopathy systolic chronic systolic heart failure ejection fraction 20%. He has significant pulmonary emphysema. Last visit noticed that he is not taking any inhalers and he was having significant dyspnea although he is on milrinone continuous infusion. Had extensive counseling and he agreed to take long-acting anticholinergic made by [employer]. He is not taking it on a daily basis except this past week when he ran out. He says this has helped his dyspnea. He says he'll only continue to take it if it does not interfere with his heart or is not a inhaled steroid.     has a past medical history of AAA (abdominal aortic aneurysm) ([place]); Anxiety; Arthritis; Atrial fibrillation ([place]); Automatic implantable cardioverter-defibrillator in situ; Carotid artery occlusion; Colon polyp; Congestive heart failure ([place]); Coronary artery disease; GERD (gastroesophageal reflux disease); H/O blood clots; HCAP (healthcare-associated pneumonia) (2014); Hyperlipidemia; Hypertension; Hypothyroidism; Ischemic cardiomyopathy; Myocardial infarction ([place]); Other and unspecified hyperlipidemia; Pneumonia; PVD (peripheral vascular disease) ([place]); Sinoatrial node dysfunction ([place]); Stroke [facility]) ([id number]); and Systolic congestive  heart failure ([place]).   reports that he quit smoking about 23 years ago. His smoking use included Cigarettes. He has a 50.00 pack-year smoking history. His smokeless tobacco use includes Chew.  Past Surgical History:  Procedure Laterality Date  . ABDOMINAL AORTIC ANEURYSM REPAIR   05/17/1993  . BI-VENTRICULAR IMPLANTABLE CARDIOVERTER DEFIBRILLATOR N/A 09/24/2013   Procedure: BI-VENTRICULAR IMPLANTABLE CARDIOVERTER DEFIBRILLATOR  (CRT-D);  Surgeon: Evans Lance, MD;  Location: [facility] CATH LAB;  Service: Cardiovascular;  Laterality: N/A;  . BI-VENTRICULAR IMPLANTABLE CARDIOVERTER DEFIBRILLATOR  (CRT-D)  09/24/2013  . CARDIAC CATHETERIZATION N/A 02/17/2016   Procedure: Right Heart Cath;  Surgeon: Jolaine Artist, MD;  Location: [place] CV LAB;  Service: Cardiovascular;  Laterality: N/A;  . CARDIAC PACEMAKER PLACEMENT  08/01/2009   st jude dual chamber; explanted 09/24/2013  . CARDIOVERSION N/A 09/23/2012   Procedure: CARDIOVERSION;  Surgeon: Lelon Perla, MD;  Location: [facility] ENDOSCOPY;  Service: Cardiovascular;  Laterality: N/A;  . CAROTID ENDARTERECTOMY Right 11-25-12  . CATARACT EXTRACTION W/ INTRAOCULAR LENS  IMPLANT, BILATERAL Bilateral   . CENTRAL VENOUS CATHETER INSERTION  02/17/2016   Procedure: Insertion Central Line Adult;  Surgeon: Jolaine Artist, MD;  Location: [place] CV LAB;  Service: Cardiovascular;;  . COLONOSCOPY    . CORONARY ANGIOPLASTY WITH STENT PLACEMENT  08/2012   "1"  . CORONARY ARTERY BYPASS GRAFT  05/17/1993   "CABG X4"  . Defribrillator    . ENDARTERECTOMY Right 11/25/2012   Procedure: ENDARTERECTOMY CAROTID With Dacron Patch Angioplasty;  Surgeon: Elam Dutch, MD;  Location: [place];  Service: Vascular;  Laterality: Right;  . ESOPHAGOGASTRODUODENOSCOPY    . INGUINAL HERNIA REPAIR Bilateral    "one at a time"  . IR GENERIC HISTORICAL  02/20/2016   IR US GUIDE VASC ACCESS RIGHT 02/20/2016 Arne Cleveland, MD [facility]-INTER[facility] RAD  . IR GENERIC HISTORICAL  02/20/2016   IR FLUORO GUIDE CV LINE RIGHT 02/20/2016 Arne Cleveland, MD [facility]  . LEFT AND RIGHT HEART CATHETERIZATION WITH CORONARY ANGIOGRAM N/A 09/15/2012   Procedure: LEFT AND RIGHT HEART CATHETERIZATION WITH CORONARY ANGIOGRAM;  Surgeon: Peter M Martinique, MD;  Location: [facility] CATH LAB;  Service:  Cardiovascular;  Laterality: N/A;  . PERCUTANEOUS CORONARY STENT INTERVENTION (PCI-S)  09/15/2012   Procedure: PERCUTANEOUS CORONARY STENT INTERVENTION (PCI-S);  Surgeon: Peter M Martinique, MD;  Location: [facility] CATH LAB;  Service: Cardiovascular;;  . RENAL ARTERY STENT    . RIGHT HEART CATHETERIZATION N/A 08/31/2013   Procedure: RIGHT HEART CATH;  Surgeon: Jolaine Artist, MD;  Location[facility] LAB;  Service: Cardiovascular;  Laterality: N/A;  . TEE WITHOUT CARDIOVERSION N/A 09/23/2012   Procedure: TRANSESOPHAGEAL ECHOCARDIOGRAM (TEE);  Surgeon: Lelon Perla, MD;  Location: [facility] ENDOSCOPY;  Service: Cardiovascular;  Laterality: N/A;  . TRANSLUMINAL ANGIOPLASTY      Allergies  Allergen Reactions  . Codeine     "tripped out on it"  . Morphine     "tripped out on it"    Immunization History  Administered Date(s) Administered  . Influenza, High Dose Seasonal PF 11/18/2015  . Influenza,inj,Quad PF,36+ Mos 11/26/2012, 01/06/2014, 01/10/2015  . Pneumococcal Conjugate-13 04/11/2015  . Pneumococcal Polysaccharide-23 01/18/2012    Family History  Problem Relation Age of Onset  . CAD Father   . Hypertension Father   . Heart disease Father   . CAD Brother   . Heart disease Brother   . Heart attack Brother   . Diabetes Brother   . Hypertension Mother   . Hypertension  Daughter   . Vascular Disease Daughter     Right Carotid  . Colon cancer Son   . Lung cancer Sister   . Heart disease Sister   . Hypertension Sister   . Colon polyps Brother     x2     Current Outpatient Prescriptions:  .  ALPRAZolam (XANAX) 0.25 MG tablet, Take 0.375 mg by mouth at bedtime., Disp: , Rfl:  .  amiodarone (PACERONE) 200 MG tablet, Take 1 tablet (200 mg total) by mouth daily., Disp: 90 tablet, Rfl: 3 .  apixaban (ELIQUIS) 2.5 MG TABS tablet, Take 1 tablet (2.5 mg total) by mouth 2 (two) times daily., Disp: 60 tablet, Rfl: 3 .  atorvastatin (LIPITOR) 20 MG tablet, Take 20 mg by mouth daily at 6 PM. ,  Disp: , Rfl:  .  B Complex-C (SUPER B COMPLEX) TABS, Take 1 tablet by mouth daily. , Disp: , Rfl:  .  calcium carbonate (OS-CAL) 600 MG TABS, Take 600 mg by mouth daily with breakfast. , Disp: , Rfl:  .  cetirizine (ZYRTEC) 10 MG tablet, Take 10 mg by mouth daily. , Disp: , Rfl:  .  digoxin (LANOXIN) 0.125 MG tablet, Take 0.5 tablets (0.0625 mg total) by mouth daily., Disp: 90 tablet, Rfl: 3 .  doxylamine, Sleep, (UNISOM) 25 MG tablet, Take 25 mg by mouth at bedtime., Disp: , Rfl:  .  fluticasone (FLONASE) 50 MCG/ACT nasal spray, Place 1 spray into both nostrils daily as needed for allergies. Reported on 04/11/2015, Disp: , Rfl:  .  furosemide (LASIX) 80 MG tablet, Take 80 mg by mouth daily., Disp: , Rfl:  .  levothyroxine (SYNTHROID, LEVOTHROID) 112 MCG tablet, Take 112 mcg by mouth See admin instructions. Pt takes Monday- Saturday - skips on Sundays, Disp: , Rfl:  .  magnesium gluconate (MAGONATE) 500 MG tablet, Take 500 mg by mouth daily. , Disp: , Rfl:  .  milrinone (PRIMACOR) 20 MG/100 ML SOLN infusion, Inject 26.775 mcg/min into the vein continuous., Disp: , Rfl:  .  Multiple Vitamin (MULTIVITAMIN WITH MINERALS) TABS, Take 1 tablet by mouth daily., Disp: , Rfl:  .  nitroGLYCERIN (NITROSTAT) 0.4 MG SL tablet, Place 1 tablet (0.4 mg total) under the tongue every 5 (five) minutes as needed for chest pain., Disp: 25 tablet, Rfl: 3 .  Omega-3 Fatty Acids (FISH OIL) 1200 MG CAPS, Take 1,200 mg by mouth daily. , Disp: , Rfl:  .  omeprazole (PRILOSEC) 20 MG capsule, Take 20 mg by mouth daily as needed (indigestion). , Disp: , Rfl:  .  potassium chloride SA (K-DUR,KLOR-CON) 20 MEQ tablet, Take 2 tablets (40 mEq total) by mouth 2 (two) times daily., Disp: 120 tablet, Rfl: 3 .  Saw Palmetto, Serenoa repens, (SAW PALMETTO PO), Take 1 capsule by mouth 2 (two) times daily., Disp: , Rfl:  .  senna (SENOKOT) 8.6 MG TABS tablet, Take 1 tablet by mouth daily as needed for mild constipation., Disp: , Rfl:  .   umeclidinium bromide (INCRUSE ELLIPTA) 62.5 MCG/INH AEPB, Inhale 1 puff into the lungs daily., Disp: 7 each, Rfl: 0 .  vitamin C (ASCORBIC ACID) 500 MG tablet, Take 500 mg by mouth daily.  , Disp: , Rfl:     Review of Systems     Objective:   Physical Exam  Constitutional: He is oriented to person, place, and time. He appears well-developed and well-nourished. No distress.  Lean frail  HENT:  Head: Normocephalic and atraumatic.  Right Ear: External ear  normal.  Left Ear: External ear normal.  Mouth/Throat: Oropharynx is clear and moist. No oropharyngeal exudate.  Eyes: Conjunctivae and EOM are normal. Pupils are equal, round, and reactive to light. Right eye exhibits no discharge. Left eye exhibits no discharge. No scleral icterus.  Neck: Normal range of motion. Neck supple. No JVD present. No tracheal deviation present. No thyromegaly present.  Cardiovascular: Normal rate, regular rhythm and intact distal pulses.  Exam reveals no gallop and no friction rub.   No murmur heard. Pulmonary/Chest: Effort normal and breath sounds normal. No respiratory distress. He has no wheezes. He has no rales. He exhibits no tenderness.  Milrinone tubing + barrell chest  Abdominal: Soft. Bowel sounds are normal. He exhibits no distension and no mass. There is no tenderness. There is no rebound and no guarding.  Musculoskeletal: Normal range of motion. He exhibits no edema or tenderness.  Slow gait  Lymphadenopathy:    He has no cervical adenopathy.  Neurological: He is alert and oriented to person, place, and time. He has normal reflexes. No cranial nerve deficit. Coordination normal.  Skin: Skin is warm and dry. No rash noted. He is not diaphoretic. No erythema. No pallor.  Psychiatric: He has a normal mood and affect. His behavior is normal. Judgment and thought content normal.  Nursing note and vitals reviewed.  Vitals:   07/17/16 1230  BP: 106/62  Pulse: 70  SpO2: 100%  Weight: 147 lb (66.7  kg)  Height: '5\' 11"'$  (1.803 m)    Estimated body mass index is 20.5 kg/m as calculated from the following:   Height as of this encounter: '5\' 11"'$  (1.803 m).   Weight as of this encounter: 147 lb (66.7 kg).     Assessment:       ICD-9-CM ICD-10-CM   1. Pulmonary emphysema, unspecified emphysema type ([place]) 492.8 J43.9        Plan:     Pulmonary emphysema ([place]) Glad incruse is helping you.   - This is not  A steroid. And Does not rev up the heart  - it is beneficial in opening up lungs and preventing flare up Copd is stable  Plan - continue incruse daily - albuterol as needed - high dose flu shot in fall  followup  6-9 months or sooner if nededed    Dr. Brand Males, M.D., [facility].C.P Pulmonary and Critical Care Medicine Staff Physician [facility] Pager: [phone], If no answer or between  15:00h - 7:00h: call [phone]  07/17/2016 12:45 PM

## 2016-07-17 NOTE — Progress Notes (Signed)
[facility] [place] HISTORY AND PHYSICAL   MRN : [id number]  History of Present Illness:   Trevor Simpson is a 83 y.o. male patient of Dr. Oneida Alar who returns for follow up s/p right carotid endarterectomy on 11/25/2012. This was a symptomatic carotid stenosis with a stroke as manifested by left hemiparesis and slurred speech before the CEA. He denies any symptoms of TIA or stroke since the CEA.  He also is s/p AAA open repair in 1995, also possible bpg of iliac arteries and renal arteries for stenosis.  He has known degenerative changes in his cervical spine.  Pt's wife states his EF is 20-25%. November 2017 echocardiogram results reviewed: LV EF of 25%.  He has been on milrinone infusion since December, 2017, via right upper chest port, HH assists.  He exercises an hour daily. He denies dizziness, denies tingling, numbness, pain, or weakness in either UE, but does report both hands feeling cold, right worse than left.  Pt Diabetic: No Pt smoker: former smoker, quit in 1995  Pt meds include: Statin : Yes ASA: No Other anticoagulants/antiplatelets: Eliquis for atrial fib.   Current Outpatient Prescriptions  Medication Sig Dispense Refill  . ALPRAZolam (XANAX) 0.25 MG tablet Take 0.375 mg by mouth at bedtime.    Marland Kitchen amiodarone (PACERONE) 200 MG tablet Take 1 tablet (200 mg total) by mouth daily. 90 tablet 3  . apixaban (ELIQUIS) 2.5 MG TABS tablet Take 1 tablet (2.5 mg total) by mouth 2 (two) times daily. 60 tablet 3  . atorvastatin (LIPITOR) 20 MG tablet Take 20 mg by mouth daily at 6 PM.     . B Complex-C (SUPER B COMPLEX) TABS Take 1 tablet by mouth daily.     . calcium carbonate (OS-CAL) 600 MG TABS Take 600 mg by mouth daily with breakfast.     . cetirizine (ZYRTEC) 10 MG tablet Take 10 mg by mouth daily.     . digoxin (LANOXIN) 0.125 MG tablet Take 0.5 tablets (0.0625 mg total) by mouth daily. 90 tablet 3  . doxylamine, Sleep, (UNISOM) 25 MG tablet  Take 25 mg by mouth at bedtime.    . fluticasone (FLONASE) 50 MCG/ACT nasal spray Place 1 spray into both nostrils daily as needed for allergies. Reported on 04/11/2015    . furosemide (LASIX) 80 MG tablet Take 80 mg by mouth daily.    Marland Kitchen levothyroxine (SYNTHROID, LEVOTHROID) 112 MCG tablet Take 112 mcg by mouth See admin instructions. Pt takes Monday- Saturday - skips on Sundays    . magnesium gluconate (MAGONATE) 500 MG tablet Take 500 mg by mouth daily.     . milrinone (PRIMACOR) 20 MG/100 ML SOLN infusion Inject 26.775 mcg/min into the vein continuous.    . Multiple Vitamin (MULTIVITAMIN WITH MINERALS) TABS Take 1 tablet by mouth daily.    . nitroGLYCERIN (NITROSTAT) 0.4 MG SL tablet Place 1 tablet (0.4 mg total) under the tongue every 5 (five) minutes as needed for chest pain. 25 tablet 3  . Omega-3 Fatty Acids (FISH OIL) 1200 MG CAPS Take 1,200 mg by mouth daily.     Marland Kitchen omeprazole (PRILOSEC) 20 MG capsule Take 20 mg by mouth daily as needed (indigestion).     . potassium chloride SA (K-DUR,KLOR-CON) 20 MEQ tablet Take 2 tablets (40 mEq total) by mouth 2 (two) times daily. 120 tablet 3  . Saw Palmetto, Serenoa repens, (SAW PALMETTO PO) Take 1 capsule by mouth 2 (two) times daily.    Marland Kitchen  senna (SENOKOT) 8.6 MG TABS tablet Take 1 tablet by mouth daily as needed for mild constipation.    Marland Kitchen umeclidinium bromide (INCRUSE ELLIPTA) 62.5 MCG/INH AEPB Inhale 1 puff into the lungs daily. 7 each 0  . vitamin C (ASCORBIC ACID) 500 MG tablet Take 500 mg by mouth daily.       No current facility-administered medications for this visit.     Past Medical History:  Diagnosis Date  . AAA (abdominal aortic aneurysm) ([place])    repaired  . Anxiety   . Arthritis    "left elbow and shoulder" (09/24/2013)  . Atrial fibrillation ([place])   . Automatic implantable cardioverter-defibrillator in situ   . Carotid artery occlusion   . Colon polyp   . Congestive heart failure ([place])   . Coronary artery disease    a. LHC  (08/2012): Lmain: long, 20% stenosis distally LAD: mod calcified and mild dz prox. diff dz proximally 30%, first diagonal mild dz prox and small, Ramus intermediate brach lg bifurcates mid vessel prox. 90-95% stenosis LCx: relatively sml, rise to single marginal and contiine AV groove, 1st marg. diff 60-70% dz stenosis prox RCA: occ. prox. R-L coll, successful stent remus interm branch BMS  . GERD (gastroesophageal reflux disease)   . H/O blood clots   . HCAP (healthcare-associated pneumonia) 2014  . Hyperlipidemia   . Hypertension   . Hypothyroidism   . Ischemic cardiomyopathy   . Myocardial infarction ([place])   . Other and unspecified hyperlipidemia   . Pneumonia   . PVD (peripheral vascular disease) ([place])    s/p renal artery stent 20-04  . Sinoatrial node dysfunction ([facility])   . Stroke [facility]) 2014   left sided affected no residual effects  . Systolic congestive heart failure ([place])    a. ICM b.     Social History Social History  Substance Use Topics  . Smoking status: Former Smoker    Packs/day: 1.00    Years: 50.00    Types: Cigarettes    Quit date: 05/17/1993  . Smokeless tobacco: Current User    Types: Chew  . Alcohol use No    Family History Family History  Problem Relation Age of Onset  . CAD Father   . Hypertension Father   . Heart disease Father   . CAD Brother   . Heart disease Brother   . Heart attack Brother   . Diabetes Brother   . Hypertension Mother   . Hypertension Daughter   . Vascular Disease Daughter     Right Carotid  . Colon cancer Son   . Lung cancer Sister   . Heart disease Sister   . Hypertension Sister   . Colon polyps Brother     x2    Surgical History Past Surgical History:  Procedure Laterality Date  . ABDOMINAL AORTIC ANEURYSM REPAIR  05/17/1993  . BI-VENTRICULAR IMPLANTABLE CARDIOVERTER DEFIBRILLATOR N/A 09/24/2013   Procedure: BI-VENTRICULAR IMPLANTABLE CARDIOVERTER DEFIBRILLATOR  (CRT-D);  Surgeon: Evans Lance, MD;  Location: [facility] CATH  LAB;  Service: Cardiovascular;  Laterality: N/A;  . BI-VENTRICULAR IMPLANTABLE CARDIOVERTER DEFIBRILLATOR  (CRT-D)  09/24/2013  . CARDIAC CATHETERIZATION N/A 02/17/2016   Procedure: Right Heart Cath;  Surgeon: Jolaine Artist, MD;  Location: [place] CV LAB;  Service: Cardiovascular;  Laterality: N/A;  . CARDIAC PACEMAKER PLACEMENT  08/01/2009   st jude dual chamber; explanted 09/24/2013  . CARDIOVERSION N/A 09/23/2012   Procedure: CARDIOVERSION;  Surgeon: Lelon Perla, MD;  Location: [facility];  Service:  Cardiovascular;  Laterality: N/A;  . CAROTID ENDARTERECTOMY Right 11-25-12  . CATARACT EXTRACTION W/ INTRAOCULAR LENS  IMPLANT, BILATERAL Bilateral   . CENTRAL VENOUS CATHETER INSERTION  02/17/2016   Procedure: Insertion Central Line Adult;  Surgeon: Jolaine Artist, MD;  Location: [facility] CV LAB;  Service: Cardiovascular;;  . COLONOSCOPY    . CORONARY ANGIOPLASTY WITH STENT PLACEMENT  08/2012   "1"  . CORONARY ARTERY BYPASS GRAFT  05/17/1993   "CABG X4"  . Defribrillator    . ENDARTERECTOMY Right 11/25/2012   Procedure: ENDARTERECTOMY CAROTID With Dacron Patch Angioplasty;  Surgeon: Elam Dutch, MD;  Location: [facility];  Service: Vascular;  Laterality: Right;  . ESOPHAGOGASTRODUODENOSCOPY    . INGUINAL HERNIA REPAIR Bilateral    "one at a time"  . IR GENERIC HISTORICAL  02/20/2016   IR US GUIDE VASC ACCESS RIGHT 02/20/2016 Arne Cleveland, MD [facility]INTER[facility] RAD  . IR GENERIC HISTORICAL  02/20/2016   IR FLUORO GUIDE CV LINE RIGHT 02/20/2016 Arne Cleveland, MD [facility]  . LEFT AND RIGHT HEART CATHETERIZATION WITH CORONARY ANGIOGRAM N/A 09/15/2012   Procedure: LEFT AND RIGHT HEART CATHETERIZATION WITH CORONARY ANGIOGRAM;  Surgeon: Peter M Martinique, MD;  Location: [facility] CATH LAB;  Service: Cardiovascular;  Laterality: N/A;  . PERCUTANEOUS CORONARY STENT INTERVENTION (PCI-S)  09/15/2012   Procedure: PERCUTANEOUS CORONARY STENT INTERVENTION (PCI-S);  Surgeon: Peter M Martinique, MD;  Location: [facility]  CATH LAB;  Service: Cardiovascular;;  . RENAL ARTERY STENT    . RIGHT HEART CATHETERIZATION N/A 08/31/2013   Procedure: RIGHT HEART CATH;  Surgeon: Jolaine Artist, MD;  Location[facility] CATH LAB;  Service: Cardiovascular;  Laterality: N/A;  . TEE WITHOUT CARDIOVERSION N/A 09/23/2012   Procedure: TRANSESOPHAGEAL ECHOCARDIOGRAM (TEE);  Surgeon: Lelon Perla, MD;  Location: [facility] ENDOSCOPY;  Service: Cardiovascular;  Laterality: N/A;  . TRANSLUMINAL ANGIOPLASTY      Allergies  Allergen Reactions  . Codeine     "tripped out on it"  . Morphine     "tripped out on it"    Current Outpatient Prescriptions  Medication Sig Dispense Refill  . ALPRAZolam (XANAX) 0.25 MG tablet Take 0.375 mg by mouth at bedtime.    Marland Kitchen amiodarone (PACERONE) 200 MG tablet Take 1 tablet (200 mg total) by mouth daily. 90 tablet 3  . apixaban (ELIQUIS) 2.5 MG TABS tablet Take 1 tablet (2.5 mg total) by mouth 2 (two) times daily. 60 tablet 3  . atorvastatin (LIPITOR) 20 MG tablet Take 20 mg by mouth daily at 6 PM.     . B Complex-C (SUPER B COMPLEX) TABS Take 1 tablet by mouth daily.     . calcium carbonate (OS-CAL) 600 MG TABS Take 600 mg by mouth daily with breakfast.     . cetirizine (ZYRTEC) 10 MG tablet Take 10 mg by mouth daily.     . digoxin (LANOXIN) 0.125 MG tablet Take 0.5 tablets (0.0625 mg total) by mouth daily. 90 tablet 3  . doxylamine, Sleep, (UNISOM) 25 MG tablet Take 25 mg by mouth at bedtime.    . fluticasone (FLONASE) 50 MCG/ACT nasal spray Place 1 spray into both nostrils daily as needed for allergies. Reported on 04/11/2015    . furosemide (LASIX) 80 MG tablet Take 80 mg by mouth daily.    Marland Kitchen levothyroxine (SYNTHROID, LEVOTHROID) 112 MCG tablet Take 112 mcg by mouth See admin instructions. Pt takes Monday- Saturday - skips on Sundays    . magnesium gluconate (MAGONATE) 500 MG tablet Take 500  mg by mouth daily.     . milrinone (PRIMACOR) 20 MG/100 ML SOLN infusion Inject 26.775 mcg/min into the vein  continuous.    . Multiple Vitamin (MULTIVITAMIN WITH MINERALS) TABS Take 1 tablet by mouth daily.    . nitroGLYCERIN (NITROSTAT) 0.4 MG SL tablet Place 1 tablet (0.4 mg total) under the tongue every 5 (five) minutes as needed for chest pain. 25 tablet 3  . Omega-3 Fatty Acids (FISH OIL) 1200 MG CAPS Take 1,200 mg by mouth daily.     Marland Kitchen omeprazole (PRILOSEC) 20 MG capsule Take 20 mg by mouth daily as needed (indigestion).     . potassium chloride SA (K-DUR,KLOR-CON) 20 MEQ tablet Take 2 tablets (40 mEq total) by mouth 2 (two) times daily. 120 tablet 3  . Saw Palmetto, Serenoa repens, (SAW PALMETTO PO) Take 1 capsule by mouth 2 (two) times daily.    Marland Kitchen senna (SENOKOT) 8.6 MG TABS tablet Take 1 tablet by mouth daily as needed for mild constipation.    Marland Kitchen umeclidinium bromide (INCRUSE ELLIPTA) 62.5 MCG/INH AEPB Inhale 1 puff into the lungs daily. 7 each 0  . vitamin C (ASCORBIC ACID) 500 MG tablet Take 500 mg by mouth daily.       No current facility-administered medications for this visit.      REVIEW OF SYSTEMS: See HPI for pertinent positives and negatives.  Physical Examination Vitals:   07/17/16 1412 07/17/16 1415  BP: 113/60 102/60  Pulse: 74 69  Resp: 16   Temp: 97 F (36.1 C)   TempSrc: Oral   SpO2: 95%   Weight: 146 lb (66.2 kg)   Height: '5\' 9"'$  (1.753 m)    Body mass index is 21.56 kg/m.  General:  WDWN thin, elderly, frail male in NAD GAIT:slow, steady, using cane Eyes: PERRLA Pulmonary: Slightly labored with use of accessory muscles, CTAB Cardiac: regular rhythm,no detected murmur. Pacemaker/defibrillator subcutaneous left upper chest. Infusion port right upper chest (tunneled IJ).   VASCULAR EXAM Carotid Bruits Right Left   negative negative   Aorta is not palpable. Radial pulses are 2+ palpable and =.      LE Pulses Right  Left       FEMORAL 2+ palpable 1+ palpable   POPLITEAL not palpable  nort palpable   POSTERIOR TIBIAL not palpable  not palpable    DORSALIS PEDIS  ANTERIOR TIBIAL 1+ palpable  not palpable     Gastrointestinal: soft, nontender, BS WNL, no r/g,no palpated masses.  Musculoskeletal: Age approriate muscle atrophy/wasting. M/S 5/5 throughout, Extremities without ischemic changes except for loose toenail right great toe and left second toe with dried dark blood under the nail.   Moderate kyphosis.  Neurologic: A&O X 3; Appropriate Affect;  Speech is slightly garbled, but he is also missing many teeth. CN 2-12 intact, Pain and light touch intact in extremities, Motor exam as listed above.     ASSESSMENT:  Trevor Simpson is a 83 y.o. male who is s/p right carotid endarterectomy on 11/25/2012. This was a symptomatic carotid stenosis with a stroke as manifested by left hemiparesis and slurred speech before the CEA. He denies any symptoms of TIA or stroke since the CEA.  He also is s/p AAA open repair in 1995, also possible bpg of iliac arteries and renal arteries for stenosis.  He exercises an hour daily despite an EF of 25%. He does not seem to have claudication symptoms, no signs of ischemia in his feet/legs other than loose toenail right  great toe and left second toe with dried dark blood under the nail.     DATA (07/17/16):  Carotid duplex : patent right carotid endarterectomy site with no evidence of hyperplasia or restenosis.  Left internal carotid artery velocities suggest a <40% stenosis. Bilateral vertebral artery flow is antegrade.  Bilateral subclavian artery waveforms are normal.  No significant change in comparison to the last exam on 06-23-15.   ABI: Right: 1.12, waveforms: PT: biphasic, DP: triphasic; TBI: 0.29 Left: 0.92, waveforms: monophasic; TBI: 0.51 No significant arterial occlusive disease in the right lower extremity, mild  disease in the left lower extremity. No previous ABI for comparison.    Plan: Follow-up in 1year with Carotid Duplex scan and ABI's. Continue daily exercising.   Thick toenails, loose toenails, PAD, referral to podiatrist.    I discussed in depth with the patient the nature of atherosclerosis, and emphasized the importance of maximal medical management including strict control of blood pressure, blood glucose, and lipid levels, obtaining regular exercise, and cessation of smoking.  The patient is aware that without maximal medical management the underlying atherosclerotic disease process will progress, limiting the benefit of any interventions.  The patient was given information about stroke prevention and what symptoms should prompt the patient to seek immediate medical care.  The patient was given information about PAD including signs, symptoms, treatment, what symptoms should prompt the patient to seek immediate medical care, and risk reduction measures to take. Thank you for allowing [place] to participate in this patient's care.  Clemon Chambers, RN, MSN, FNP-C [facility] Office: [phone]  Clinic MD: Early 07/17/2016 2:19 PM

## 2016-07-17 NOTE — Progress Notes (Signed)
Remote ICD transmission.   

## 2016-07-17 NOTE — Assessment & Plan Note (Signed)
Glad incruse is helping you.   - This is not  A steroid. And Does not rev up the heart  - it is beneficial in opening up lungs and preventing flare up Copd is stable  Plan - continue incruse daily - albuterol as needed - high dose flu shot in fall  followup  6-9 months or sooner if nededed

## 2016-07-17 NOTE — Addendum Note (Signed)
Addended by: Virl Cagey on: 07/17/2016 12:50 PM   Modules accepted: Orders

## 2016-07-18 ENCOUNTER — Encounter: Payer: Self-pay | Admitting: Cardiology

## 2016-07-18 DIAGNOSIS — I5033 Acute on chronic diastolic (congestive) heart failure: Secondary | ICD-10-CM | POA: Diagnosis not present

## 2016-07-18 DIAGNOSIS — Z950 Presence of cardiac pacemaker: Secondary | ICD-10-CM | POA: Diagnosis not present

## 2016-07-18 DIAGNOSIS — Z86718 Personal history of other venous thrombosis and embolism: Secondary | ICD-10-CM | POA: Diagnosis not present

## 2016-07-18 DIAGNOSIS — N183 Chronic kidney disease, stage 3 (moderate): Secondary | ICD-10-CM | POA: Diagnosis not present

## 2016-07-18 DIAGNOSIS — I130 Hypertensive heart and chronic kidney disease with heart failure and stage 1 through stage 4 chronic kidney disease, or unspecified chronic kidney disease: Secondary | ICD-10-CM | POA: Diagnosis not present

## 2016-07-18 DIAGNOSIS — I5021 Acute systolic (congestive) heart failure: Secondary | ICD-10-CM | POA: Diagnosis not present

## 2016-07-18 DIAGNOSIS — K219 Gastro-esophageal reflux disease without esophagitis: Secondary | ICD-10-CM | POA: Diagnosis not present

## 2016-07-18 DIAGNOSIS — Z7901 Long term (current) use of anticoagulants: Secondary | ICD-10-CM | POA: Diagnosis not present

## 2016-07-18 DIAGNOSIS — I5022 Chronic systolic (congestive) heart failure: Secondary | ICD-10-CM | POA: Diagnosis not present

## 2016-07-18 LAB — CUP PACEART REMOTE DEVICE CHECK
Battery Remaining Longevity: 26 mo
Battery Remaining Percentage: 44 %
Battery Voltage: 2.9 V
Brady Statistic AS VP Percent: 8.6 %
Brady Statistic AS VS Percent: 1 %
Date Time Interrogation Session: 20180501080023
HIGH POWER IMPEDANCE MEASURED VALUE: 65 Ohm
HIGH POWER IMPEDANCE MEASURED VALUE: 65 Ohm
Implantable Lead Implant Date: 20150709
Implantable Lead Location: 753858
Implantable Lead Location: 753860
Implantable Pulse Generator Implant Date: 20150709
Lead Channel Impedance Value: 400 Ohm
Lead Channel Impedance Value: 760 Ohm
Lead Channel Pacing Threshold Amplitude: 1.375 V
Lead Channel Pacing Threshold Amplitude: 1.5 V
Lead Channel Sensing Intrinsic Amplitude: 12 mV
Lead Channel Sensing Intrinsic Amplitude: 4.2 mV
Lead Channel Setting Pacing Amplitude: 2 V
Lead Channel Setting Pacing Pulse Width: 0.5 ms
Lead Channel Setting Pacing Pulse Width: 0.5 ms
Lead Channel Setting Sensing Sensitivity: 0.5 mV
MDC IDC LEAD IMPLANT DT: 20110516
MDC IDC LEAD IMPLANT DT: 20150709
MDC IDC LEAD LOCATION: 753859
MDC IDC MSMT LEADCHNL LV PACING THRESHOLD PULSEWIDTH: 0.5 ms
MDC IDC MSMT LEADCHNL RA IMPEDANCE VALUE: 380 Ohm
MDC IDC MSMT LEADCHNL RA PACING THRESHOLD PULSEWIDTH: 1.2 ms
MDC IDC MSMT LEADCHNL RV PACING THRESHOLD AMPLITUDE: 0.75 V
MDC IDC MSMT LEADCHNL RV PACING THRESHOLD PULSEWIDTH: 0.5 ms
MDC IDC PG SERIAL: 7201810
MDC IDC SET LEADCHNL LV PACING AMPLITUDE: 2.375
MDC IDC SET LEADCHNL RA PACING AMPLITUDE: 2.5 V
MDC IDC STAT BRADY AP VP PERCENT: 89 %
MDC IDC STAT BRADY AP VS PERCENT: 2.2 %
MDC IDC STAT BRADY RA PERCENT PACED: 88 %

## 2016-07-18 NOTE — Addendum Note (Signed)
Addended by: Lianne Cure A on: 07/18/2016 12:38 PM   Modules accepted: Orders

## 2016-07-19 DIAGNOSIS — I5022 Chronic systolic (congestive) heart failure: Secondary | ICD-10-CM | POA: Diagnosis not present

## 2016-07-20 DIAGNOSIS — I5022 Chronic systolic (congestive) heart failure: Secondary | ICD-10-CM | POA: Diagnosis not present

## 2016-07-21 DIAGNOSIS — J449 Chronic obstructive pulmonary disease, unspecified: Secondary | ICD-10-CM | POA: Diagnosis not present

## 2016-07-21 DIAGNOSIS — I5022 Chronic systolic (congestive) heart failure: Secondary | ICD-10-CM | POA: Diagnosis not present

## 2016-07-22 DIAGNOSIS — I5022 Chronic systolic (congestive) heart failure: Secondary | ICD-10-CM | POA: Diagnosis not present

## 2016-07-23 DIAGNOSIS — I5022 Chronic systolic (congestive) heart failure: Secondary | ICD-10-CM | POA: Diagnosis not present

## 2016-07-24 DIAGNOSIS — I5022 Chronic systolic (congestive) heart failure: Secondary | ICD-10-CM | POA: Diagnosis not present

## 2016-07-25 DIAGNOSIS — I5033 Acute on chronic diastolic (congestive) heart failure: Secondary | ICD-10-CM | POA: Diagnosis not present

## 2016-07-25 DIAGNOSIS — Z7901 Long term (current) use of anticoagulants: Secondary | ICD-10-CM | POA: Diagnosis not present

## 2016-07-25 DIAGNOSIS — K219 Gastro-esophageal reflux disease without esophagitis: Secondary | ICD-10-CM | POA: Diagnosis not present

## 2016-07-25 DIAGNOSIS — I5022 Chronic systolic (congestive) heart failure: Secondary | ICD-10-CM | POA: Diagnosis not present

## 2016-07-25 DIAGNOSIS — N183 Chronic kidney disease, stage 3 (moderate): Secondary | ICD-10-CM | POA: Diagnosis not present

## 2016-07-25 DIAGNOSIS — Z950 Presence of cardiac pacemaker: Secondary | ICD-10-CM | POA: Diagnosis not present

## 2016-07-25 DIAGNOSIS — Z86718 Personal history of other venous thrombosis and embolism: Secondary | ICD-10-CM | POA: Diagnosis not present

## 2016-07-25 DIAGNOSIS — I130 Hypertensive heart and chronic kidney disease with heart failure and stage 1 through stage 4 chronic kidney disease, or unspecified chronic kidney disease: Secondary | ICD-10-CM | POA: Diagnosis not present

## 2016-07-26 DIAGNOSIS — I5022 Chronic systolic (congestive) heart failure: Secondary | ICD-10-CM | POA: Diagnosis not present

## 2016-07-27 ENCOUNTER — Other Ambulatory Visit (HOSPITAL_COMMUNITY): Payer: Self-pay | Admitting: Internal Medicine

## 2016-07-27 DIAGNOSIS — I5022 Chronic systolic (congestive) heart failure: Secondary | ICD-10-CM | POA: Diagnosis not present

## 2016-07-28 DIAGNOSIS — I5022 Chronic systolic (congestive) heart failure: Secondary | ICD-10-CM | POA: Diagnosis not present

## 2016-07-29 DIAGNOSIS — I5022 Chronic systolic (congestive) heart failure: Secondary | ICD-10-CM | POA: Diagnosis not present

## 2016-07-30 DIAGNOSIS — I5022 Chronic systolic (congestive) heart failure: Secondary | ICD-10-CM | POA: Diagnosis not present

## 2016-07-31 ENCOUNTER — Other Ambulatory Visit (HOSPITAL_COMMUNITY): Payer: Self-pay | Admitting: Student

## 2016-07-31 DIAGNOSIS — I5022 Chronic systolic (congestive) heart failure: Secondary | ICD-10-CM | POA: Diagnosis not present

## 2016-08-01 DIAGNOSIS — I5033 Acute on chronic diastolic (congestive) heart failure: Secondary | ICD-10-CM | POA: Diagnosis not present

## 2016-08-01 DIAGNOSIS — K219 Gastro-esophageal reflux disease without esophagitis: Secondary | ICD-10-CM | POA: Diagnosis not present

## 2016-08-01 DIAGNOSIS — N183 Chronic kidney disease, stage 3 (moderate): Secondary | ICD-10-CM | POA: Diagnosis not present

## 2016-08-01 DIAGNOSIS — Z86718 Personal history of other venous thrombosis and embolism: Secondary | ICD-10-CM | POA: Diagnosis not present

## 2016-08-01 DIAGNOSIS — Z950 Presence of cardiac pacemaker: Secondary | ICD-10-CM | POA: Diagnosis not present

## 2016-08-01 DIAGNOSIS — I5021 Acute systolic (congestive) heart failure: Secondary | ICD-10-CM | POA: Diagnosis not present

## 2016-08-01 DIAGNOSIS — I5022 Chronic systolic (congestive) heart failure: Secondary | ICD-10-CM | POA: Diagnosis not present

## 2016-08-01 DIAGNOSIS — Z7901 Long term (current) use of anticoagulants: Secondary | ICD-10-CM | POA: Diagnosis not present

## 2016-08-01 DIAGNOSIS — I130 Hypertensive heart and chronic kidney disease with heart failure and stage 1 through stage 4 chronic kidney disease, or unspecified chronic kidney disease: Secondary | ICD-10-CM | POA: Diagnosis not present

## 2016-08-02 DIAGNOSIS — I5022 Chronic systolic (congestive) heart failure: Secondary | ICD-10-CM | POA: Diagnosis not present

## 2016-08-03 DIAGNOSIS — I5022 Chronic systolic (congestive) heart failure: Secondary | ICD-10-CM | POA: Diagnosis not present

## 2016-08-04 DIAGNOSIS — I5022 Chronic systolic (congestive) heart failure: Secondary | ICD-10-CM | POA: Diagnosis not present

## 2016-08-05 DIAGNOSIS — I5022 Chronic systolic (congestive) heart failure: Secondary | ICD-10-CM | POA: Diagnosis not present

## 2016-08-06 DIAGNOSIS — I5022 Chronic systolic (congestive) heart failure: Secondary | ICD-10-CM | POA: Diagnosis not present

## 2016-08-07 DIAGNOSIS — I5022 Chronic systolic (congestive) heart failure: Secondary | ICD-10-CM | POA: Diagnosis not present

## 2016-08-08 ENCOUNTER — Ambulatory Visit (INDEPENDENT_AMBULATORY_CARE_PROVIDER_SITE_OTHER): Payer: Medicare HMO | Admitting: Podiatry

## 2016-08-08 ENCOUNTER — Ambulatory Visit (INDEPENDENT_AMBULATORY_CARE_PROVIDER_SITE_OTHER): Payer: Medicare HMO

## 2016-08-08 DIAGNOSIS — L84 Corns and callosities: Secondary | ICD-10-CM

## 2016-08-08 DIAGNOSIS — B351 Tinea unguium: Secondary | ICD-10-CM

## 2016-08-08 DIAGNOSIS — I130 Hypertensive heart and chronic kidney disease with heart failure and stage 1 through stage 4 chronic kidney disease, or unspecified chronic kidney disease: Secondary | ICD-10-CM | POA: Diagnosis not present

## 2016-08-08 DIAGNOSIS — Z950 Presence of cardiac pacemaker: Secondary | ICD-10-CM | POA: Diagnosis not present

## 2016-08-08 DIAGNOSIS — M79676 Pain in unspecified toe(s): Secondary | ICD-10-CM

## 2016-08-08 DIAGNOSIS — Z7901 Long term (current) use of anticoagulants: Secondary | ICD-10-CM | POA: Diagnosis not present

## 2016-08-08 DIAGNOSIS — Z86718 Personal history of other venous thrombosis and embolism: Secondary | ICD-10-CM | POA: Diagnosis not present

## 2016-08-08 DIAGNOSIS — I5022 Chronic systolic (congestive) heart failure: Secondary | ICD-10-CM | POA: Diagnosis not present

## 2016-08-08 DIAGNOSIS — N183 Chronic kidney disease, stage 3 (moderate): Secondary | ICD-10-CM | POA: Diagnosis not present

## 2016-08-08 DIAGNOSIS — K219 Gastro-esophageal reflux disease without esophagitis: Secondary | ICD-10-CM | POA: Diagnosis not present

## 2016-08-08 DIAGNOSIS — I5033 Acute on chronic diastolic (congestive) heart failure: Secondary | ICD-10-CM | POA: Diagnosis not present

## 2016-08-09 DIAGNOSIS — I5022 Chronic systolic (congestive) heart failure: Secondary | ICD-10-CM | POA: Diagnosis not present

## 2016-08-10 DIAGNOSIS — I5022 Chronic systolic (congestive) heart failure: Secondary | ICD-10-CM | POA: Diagnosis not present

## 2016-08-10 NOTE — Progress Notes (Signed)
   SUBJECTIVE Patient  presents to office today complaining of elongated, thickened nails. Pain while ambulating in shoes. Patient is unable to trim their own nails. He also reports discoloration of the bilateral feet. He reports a callus to the plantar aspect of the right foot that is painful. He reports a nodule to the lateral side of the left foot that is also painful and has been present for the past 5 months.  OBJECTIVE General Patient is awake, alert, and oriented x 3 and in no acute distress. Derm Skin is dry and supple bilateral. Negative open lesions or macerations. Remaining integument unremarkable. Nails are tender, long, thickened and dystrophic with subungual debris, consistent with onychomycosis, 1-5 bilateral. No signs of infection noted. Vasc  DP and PT pedal pulses palpable bilaterally. Temperature gradient within normal limits.  Neuro Epicritic and protective threshold sensation diminished bilaterally.  Musculoskeletal Exam No symptomatic pedal deformities noted bilateral. Muscular strength within normal limits.  Radiographic Exam:  Normal osseous mineralization. Joint spaces preserved. No fracture/dislocation/boney destruction.  ASSESSMENT 1. Onychodystrophic nails 1-5 bilateral with hyperkeratosis of nails.  2. Onychomycosis of nail due to dermatophyte bilateral 3. Pain in foot bilateral  PLAN OF CARE 1. Patient evaluated today.  2. Instructed to maintain good pedal hygiene and foot care.  3. Mechanical debridement of nails 1-5 bilaterally performed using a nail nipper. Filed with dremel without incident.  4. Return to clinic in 3 mos.    Edrick Kins, DPM [facility]  Dr. Edrick Kins, [place]                                        [place], [place] [address]                Office [phone]  Fax [phone]

## 2016-08-11 DIAGNOSIS — I5022 Chronic systolic (congestive) heart failure: Secondary | ICD-10-CM | POA: Diagnosis not present

## 2016-08-12 DIAGNOSIS — I5022 Chronic systolic (congestive) heart failure: Secondary | ICD-10-CM | POA: Diagnosis not present

## 2016-08-13 DIAGNOSIS — I5022 Chronic systolic (congestive) heart failure: Secondary | ICD-10-CM | POA: Diagnosis not present

## 2016-08-14 ENCOUNTER — Telehealth: Payer: Self-pay | Admitting: Internal Medicine

## 2016-08-14 ENCOUNTER — Other Ambulatory Visit (HOSPITAL_COMMUNITY): Payer: Self-pay | Admitting: Internal Medicine

## 2016-08-14 DIAGNOSIS — I5022 Chronic systolic (congestive) heart failure: Secondary | ICD-10-CM | POA: Diagnosis not present

## 2016-08-14 NOTE — Telephone Encounter (Signed)
No samples of Incruse available  I spoke with the pt and notified of this  I offered to call in rx and he refused  He states will call back later to see if we get any  Nothing further needed

## 2016-08-15 ENCOUNTER — Ambulatory Visit (HOSPITAL_COMMUNITY)
Admission: RE | Admit: 2016-08-15 | Discharge: 2016-08-15 | Disposition: A | Discharge status: Home / Self Care | Payer: Medicare HMO | Source: Ambulatory Visit | Attending: Cardiology | Admitting: Cardiology

## 2016-08-15 VITALS — BP 110/62 | Wt 146.1 lb

## 2016-08-15 DIAGNOSIS — Z833 Family history of diabetes mellitus: Secondary | ICD-10-CM | POA: Insufficient documentation

## 2016-08-15 DIAGNOSIS — I5033 Acute on chronic diastolic (congestive) heart failure: Secondary | ICD-10-CM | POA: Diagnosis not present

## 2016-08-15 DIAGNOSIS — Z8249 Family history of ischemic heart disease and other diseases of the circulatory system: Secondary | ICD-10-CM | POA: Diagnosis not present

## 2016-08-15 DIAGNOSIS — I480 Paroxysmal atrial fibrillation: Secondary | ICD-10-CM | POA: Diagnosis not present

## 2016-08-15 DIAGNOSIS — Z79899 Other long term (current) drug therapy: Secondary | ICD-10-CM | POA: Diagnosis not present

## 2016-08-15 DIAGNOSIS — I959 Hypotension, unspecified: Secondary | ICD-10-CM | POA: Diagnosis not present

## 2016-08-15 DIAGNOSIS — I2510 Atherosclerotic heart disease of native coronary artery without angina pectoris: Secondary | ICD-10-CM | POA: Diagnosis not present

## 2016-08-15 DIAGNOSIS — K219 Gastro-esophageal reflux disease without esophagitis: Secondary | ICD-10-CM | POA: Diagnosis not present

## 2016-08-15 DIAGNOSIS — N183 Chronic kidney disease, stage 3 (moderate): Secondary | ICD-10-CM | POA: Diagnosis not present

## 2016-08-15 DIAGNOSIS — Z801 Family history of malignant neoplasm of trachea, bronchus and lung: Secondary | ICD-10-CM | POA: Diagnosis not present

## 2016-08-15 DIAGNOSIS — Z95810 Presence of automatic (implantable) cardiac defibrillator: Secondary | ICD-10-CM | POA: Insufficient documentation

## 2016-08-15 DIAGNOSIS — Z8673 Personal history of transient ischemic attack (TIA), and cerebral infarction without residual deficits: Secondary | ICD-10-CM | POA: Diagnosis not present

## 2016-08-15 DIAGNOSIS — Z950 Presence of cardiac pacemaker: Secondary | ICD-10-CM | POA: Diagnosis not present

## 2016-08-15 DIAGNOSIS — I255 Ischemic cardiomyopathy: Secondary | ICD-10-CM | POA: Insufficient documentation

## 2016-08-15 DIAGNOSIS — Z885 Allergy status to narcotic agent status: Secondary | ICD-10-CM | POA: Diagnosis not present

## 2016-08-15 DIAGNOSIS — Z86718 Personal history of other venous thrombosis and embolism: Secondary | ICD-10-CM | POA: Diagnosis not present

## 2016-08-15 DIAGNOSIS — J449 Chronic obstructive pulmonary disease, unspecified: Secondary | ICD-10-CM | POA: Diagnosis not present

## 2016-08-15 DIAGNOSIS — I5021 Acute systolic (congestive) heart failure: Secondary | ICD-10-CM | POA: Diagnosis not present

## 2016-08-15 DIAGNOSIS — Z7901 Long term (current) use of anticoagulants: Secondary | ICD-10-CM | POA: Insufficient documentation

## 2016-08-15 DIAGNOSIS — I130 Hypertensive heart and chronic kidney disease with heart failure and stage 1 through stage 4 chronic kidney disease, or unspecified chronic kidney disease: Secondary | ICD-10-CM | POA: Diagnosis not present

## 2016-08-15 DIAGNOSIS — I5022 Chronic systolic (congestive) heart failure: Secondary | ICD-10-CM | POA: Diagnosis not present

## 2016-08-15 LAB — COOXEMETRY PANEL
CARBOXYHEMOGLOBIN: 1.4 % (ref 0.5–1.5)
METHEMOGLOBIN: 0.8 % (ref 0.0–1.5)
O2 Saturation: 54.8 %
Total hemoglobin: 14.3 g/dL (ref 12.0–16.0)

## 2016-08-15 NOTE — Progress Notes (Signed)
Medication Samples have been provided to the patient.  Drug name: Eliquis       Strength: 5mg         Qty: 4  LOT: [id number] Exp.Date: 4/20  Dosing instructions: 1/2 tab Twice daily   The patient has been instructed regarding the correct time, dose, and frequency of taking this medication, including desired effects and most common side effects.   Trevor Simpson 12:00 PM 08/15/2016

## 2016-08-15 NOTE — Progress Notes (Signed)
Patient ID: Trevor Simpson, male   DOB: Dec 20, 1933, 83 y.o.   MRN: [id number]    Advanced Heart Failure Clinic Note   PCP: Dr Maudie Mercury Pulmonology: Dr Chase Caller Cardiology: Dr Lovena Le  HPI: Trevor Simpson is a 83 y.o. year old with history of  COPD, symptomatic bradycardia, PAF, HTN, PAD, CVA, chronic systolic heart failure and ischemic cardiomyopathy (EF 20-25% echo 7/16)  s/p CRT-D on 09/26/13  Admitted in 6/15 with low-output. RHC as below. Started on milrinone. In July 2015 underwent CRT-D upgrade Mean RA 11 PA 53/24  Mean PCWP 22 CI 1.5  Echo (6/15): EF 15%, moderate central Trevor, mild to moderately decreased RV systolic function.   Milrinone stopped in September 2015 due to patient preference, despite borderline co-ox.  Admitted 12/1 -> 02/21/16 with CP and profound fatigue. RHC with depressed cardiac output. Started on milrinone. Titrated up to 0.375 mcg with continued low mixed venous sat.  Improved with diureses and milrinone. Coox 67% on discharge.   Trevor Simpson presents today for regular follow up. Feeling more fatigued and more SOB.  Also occasionally having dysphagia, though not progressive.  Weight at home 139-142. Appetite is good.  Denies lightheadedness or dizziness. Too tired to work in the yard or go [employer].  No fevers or chills. Remains very weak.  No ICD shocks.   Corevue: No VT/VF. AT/AF burden< 1.8%, Thoracic impedence above threshold.   Review of systems complete and found to be negative unless listed in HPI.      [place] 12/1: RA = 11 RV = 56/14 PA = 55/26 (38) PCW = 27 mean V wave to 40 Fick cardiac output/index = 2.4/1.3 PVR = 4.5 WU Ao sat = 99% PA sat = 41%, 43%  LHC 08/2012  Left mainstem: The left main is long with 20% stenosis distally.  Left anterior descending (LAD): The left anterior descending artery is moderately calcified in the proximal vessel. There is diffuse disease in the proximal vessel up to 30%. The first diagonal is relatively small and has mild disease  proximally. The ramus intermediate branch is a large branch that bifurcates in the mid vessel. The proximal vessel has a 90-95% stenosis.  Left circumflex (LCx): The left circumflex is relatively small. It gives rise to a single marginal branch and then continues in the AV groove. The first marginal branch has diffuse 60-70% stenosis proximally.  Right coronary artery (RCA): The right coronary is occluded proximally. There are right to right and left to right collaterals. Severe two-vessel obstructive coronary disease. Chronic total occlusion of the right coronary Successful stenting of the ramus intermediate branch with a bare-metal stent  Labs:  7/14 AST 27 ALT 21  11/14 K 3.8 Creatinine  1.41  12/14 Pro BNP 1100 4/14: K 3.9, creatinine 1.4, BUN 22 6/15: K 4.1, creatinine 1.2 7/15: K 3.7, creatinine 1.36, HCT 43.7, co-ox 66.5% 8/15: digoxin 1.2, co-ox 55%, K 4.0, creatinine 1.36 9/15: co-ox 56.8% 11/15 K 3.8, creatinine 1.25 10/17/14: K 3.3 creatinine 1.34   SH: Lives at home with wife  FH: Father, Brother - CAD HTN, DM        Sister - lung cancer     Current Outpatient Prescriptions  Medication Sig Dispense Refill  . ALPRAZolam (XANAX) 0.25 MG tablet Take 0.375 mg by mouth at bedtime.    Marland Kitchen amiodarone (PACERONE) 200 MG tablet Take 1 tablet (200 mg total) by mouth daily. 90 tablet 3  . apixaban (ELIQUIS) 2.5 MG TABS tablet Take 1 tablet (  2.5 mg total) by mouth 2 (two) times daily. 60 tablet 3  . atorvastatin (LIPITOR) 20 MG tablet Take 20 mg by mouth daily at 6 PM.     . B Complex-C (SUPER B COMPLEX) TABS Take 1 tablet by mouth daily.     . calcium carbonate (OS-CAL) 600 MG TABS Take 600 mg by mouth daily with breakfast.     . cetirizine (ZYRTEC) 10 MG tablet Take 10 mg by mouth daily.     . digoxin (LANOXIN) 0.125 MG tablet Take 0.5 tablets (0.0625 mg total) by mouth daily. 90 tablet 3  . doxylamine, Sleep, (UNISOM) 25 MG tablet Take 25 mg by mouth at bedtime.    . fluticasone  (FLONASE) 50 MCG/ACT nasal spray Place 1 spray into both nostrils daily as needed for allergies. Reported on 04/11/2015    . furosemide (LASIX) 80 MG tablet Take 80 mg by mouth daily.    Marland Kitchen levothyroxine (SYNTHROID, LEVOTHROID) 112 MCG tablet Take 112 mcg by mouth See admin instructions. Pt takes Monday- Saturday - skips on Sundays    . magnesium gluconate (MAGONATE) 500 MG tablet Take 500 mg by mouth daily.     . milrinone (PRIMACOR) 20 MG/100 ML SOLN infusion Inject 26.775 mcg/min into the vein continuous.    . Multiple Vitamin (MULTIVITAMIN WITH MINERALS) TABS Take 1 tablet by mouth daily.    . nitroGLYCERIN (NITROSTAT) 0.4 MG SL tablet Place 1 tablet (0.4 mg total) under the tongue every 5 (five) minutes as needed for chest pain. 25 tablet 3  . Omega-3 Fatty Acids (FISH OIL) 1200 MG CAPS Take 1,200 mg by mouth daily.     Marland Kitchen omeprazole (PRILOSEC) 20 MG capsule Take 20 mg by mouth daily as needed (indigestion).     . potassium chloride SA (KLOR-CON M20) 20 MEQ tablet Take 2 tablets (40 mEq total) by mouth 2 (two) times daily. 120 tablet 2  . Saw Palmetto, Serenoa repens, (SAW PALMETTO PO) Take 1 capsule by mouth 2 (two) times daily.    Marland Kitchen senna (SENOKOT) 8.6 MG TABS tablet Take 1 tablet by mouth daily as needed for mild constipation.    Marland Kitchen umeclidinium bromide (INCRUSE ELLIPTA) 62.5 MCG/INH AEPB Inhale 1 puff into the lungs daily. 7 each 0  . vitamin C (ASCORBIC ACID) 500 MG tablet Take 500 mg by mouth daily.       No current facility-administered medications for this encounter.      Allergies  Allergen Reactions  . Codeine     "tripped out on it"  . Morphine     "tripped out on it"    Vitals:   08/15/16 1145  BP: 110/62  Weight: 146 lb 2 oz (66.3 kg)   Wt Readings from Last 3 Encounters:  08/15/16 146 lb 2 oz (66.3 kg)  07/17/16 146 lb (66.2 kg)  07/17/16 147 lb (66.7 kg)     PHYSICAL EXAM: General: Elderly and fatigued appearing. NAD.  HEENT: Thin. Temporal wasting.  Neck:  supple. JVP not elevated on exam. Carotids 2+ bilat; no bruits. No thyromegaly or nodule noted. Cor: PMI nondisplaced. RRR, 2/6 TR, 2/6 HSM apex. + S3. RIJ tunneled PICC.  Lungs: CTAB, normal effort. Abdomen: soft, non-tender, distended, no HSM. No bruits or masses. +BS  Extremities: no cyanosis, clubbing, rash, R and LLE no edema. Cool Neuro: alert & orientedx3, cranial nerves grossly intact. moves all 4 extremities w/o difficulty. Affect pleasant    ASSESSMENT & PLAN: 1. Chronic Systolic Heart Failure: Ischemic  cardiomyopathy s/p CRT-D; Echo 6/15 with EF 15%, moderate Trevor, mild to moderately decreased RV systolic function. Echo 7/16 EF 20-25% mild Trevor/AI. RV mild dysfunction  RHC in 6/15 showed CI 1.5 so patient was started on milrinone.  Milrinone stopped in 11/2013.  Improved after CRT optimization. Milrinone resumed 12/17 - Chronic NYHA IIIb. symptoms on milrinone. - Check Coox today. Continue milrinone 0.375 mcg/kg/min for palliation.  Not a candidate for advanced therapies given age and co-morbidities.  - Volume status stable on exam and Corevue. Continue lasix 80 mg daily with extra 40 mg as needed - Echo 01/19/16 LVEF 25%, RV mod reduced - Off BB with low output.  - Does not tolerate addition of ARB with hypotension. No ivabradine with HR < 70 and PAF - Reinforced fluid restriction to < 2 L daily, sodium restriction to less than 2000 mg daily, and the importance of daily weights.   2. PAF: S/P DC-CV 09/2012.   - Had recurrent AF on Saturday 2/24. Will severe symptoms. Now back in NSR - Continue amiodarone 200 mg daily.  - Continue Eliquis 2.5 BID. Denies bleeding.  - Recent LFTs and TFTs stable.  - No change. 3. CAD: Extensive disease burden by cath 08/2012. - No ischemic symptoms.  - Continue statin. Lipids per PCP.   - No aspirin with relatively stable CAD and use of Eliquis 4. CKD stage III - Labs drawn earlier today.  5. Hypotension - Stable. Will not up-titrate meds with low  output.   Pt has end-stage HF. Not on upper limit of milrinone, but pt and family hesitant to increase as they view this as a herald of "the end". Again explained that milrinone will no prolong his life, but simply make his symptoms more tolerable, and will only help for a certain amount of time. Family and patient verbalize their understanding. Coox today as above. Full labs drawn earlier today via [facility]. Will await.  RTC 4 weeks.   Addendum: Coox 54.9%. Low to borderline.  Discussed with MD and will leave at current dose of 0.375 mcg/kg/min  Shirley Friar, PA-C  11:49 AM  Greater than 50% of the 25 minute visit was spent in counseling/coordination of care regarding disease state education, medication reconciliation, review of plan with MD, and discussion of medical regimen with On-site Pharm-D.

## 2016-08-15 NOTE — Patient Instructions (Signed)
Your physician recommends that you schedule a follow-up appointment in: 4 weeks with Trevor Ebbs   Do the following things EVERYDAY: 1) Weigh yourself in the morning before breakfast. Write it down and keep it in a log. 2) Take your medicines as prescribed 3) Eat low salt foods-Limit salt (sodium) to 2000 mg per day.  4) Stay as active as you can everyday 5) Limit all fluids for the day to less than 2 liters

## 2016-08-16 DIAGNOSIS — I5022 Chronic systolic (congestive) heart failure: Secondary | ICD-10-CM | POA: Diagnosis not present

## 2016-08-17 DIAGNOSIS — I5022 Chronic systolic (congestive) heart failure: Secondary | ICD-10-CM | POA: Diagnosis not present

## 2016-08-18 DIAGNOSIS — I5022 Chronic systolic (congestive) heart failure: Secondary | ICD-10-CM | POA: Diagnosis not present

## 2016-08-19 DIAGNOSIS — I5022 Chronic systolic (congestive) heart failure: Secondary | ICD-10-CM | POA: Diagnosis not present

## 2016-08-20 DIAGNOSIS — I130 Hypertensive heart and chronic kidney disease with heart failure and stage 1 through stage 4 chronic kidney disease, or unspecified chronic kidney disease: Secondary | ICD-10-CM

## 2016-08-20 DIAGNOSIS — N183 Chronic kidney disease, stage 3 (moderate): Secondary | ICD-10-CM

## 2016-08-20 DIAGNOSIS — I5022 Chronic systolic (congestive) heart failure: Secondary | ICD-10-CM | POA: Diagnosis not present

## 2016-08-20 DIAGNOSIS — I480 Paroxysmal atrial fibrillation: Secondary | ICD-10-CM

## 2016-08-20 DIAGNOSIS — I5033 Acute on chronic diastolic (congestive) heart failure: Secondary | ICD-10-CM

## 2016-08-21 DIAGNOSIS — I5022 Chronic systolic (congestive) heart failure: Secondary | ICD-10-CM | POA: Diagnosis not present

## 2016-08-21 DIAGNOSIS — J449 Chronic obstructive pulmonary disease, unspecified: Secondary | ICD-10-CM | POA: Diagnosis not present

## 2016-08-22 ENCOUNTER — Other Ambulatory Visit (HOSPITAL_COMMUNITY): Payer: Self-pay | Admitting: Student

## 2016-08-22 DIAGNOSIS — I5033 Acute on chronic diastolic (congestive) heart failure: Secondary | ICD-10-CM | POA: Diagnosis not present

## 2016-08-22 DIAGNOSIS — Z7901 Long term (current) use of anticoagulants: Secondary | ICD-10-CM | POA: Diagnosis not present

## 2016-08-22 DIAGNOSIS — K219 Gastro-esophageal reflux disease without esophagitis: Secondary | ICD-10-CM | POA: Diagnosis not present

## 2016-08-22 DIAGNOSIS — N183 Chronic kidney disease, stage 3 (moderate): Secondary | ICD-10-CM | POA: Diagnosis not present

## 2016-08-22 DIAGNOSIS — Z950 Presence of cardiac pacemaker: Secondary | ICD-10-CM | POA: Diagnosis not present

## 2016-08-22 DIAGNOSIS — I130 Hypertensive heart and chronic kidney disease with heart failure and stage 1 through stage 4 chronic kidney disease, or unspecified chronic kidney disease: Secondary | ICD-10-CM | POA: Diagnosis not present

## 2016-08-22 DIAGNOSIS — Z86718 Personal history of other venous thrombosis and embolism: Secondary | ICD-10-CM | POA: Diagnosis not present

## 2016-08-22 DIAGNOSIS — I5022 Chronic systolic (congestive) heart failure: Secondary | ICD-10-CM | POA: Diagnosis not present

## 2016-08-23 ENCOUNTER — Other Ambulatory Visit (HOSPITAL_COMMUNITY): Payer: Self-pay | Admitting: Internal Medicine

## 2016-08-23 DIAGNOSIS — I5022 Chronic systolic (congestive) heart failure: Secondary | ICD-10-CM | POA: Diagnosis not present

## 2016-08-24 DIAGNOSIS — I5022 Chronic systolic (congestive) heart failure: Secondary | ICD-10-CM | POA: Diagnosis not present

## 2016-08-25 DIAGNOSIS — I5022 Chronic systolic (congestive) heart failure: Secondary | ICD-10-CM | POA: Diagnosis not present

## 2016-08-26 DIAGNOSIS — I5022 Chronic systolic (congestive) heart failure: Secondary | ICD-10-CM | POA: Diagnosis not present

## 2016-08-27 DIAGNOSIS — I5022 Chronic systolic (congestive) heart failure: Secondary | ICD-10-CM | POA: Diagnosis not present

## 2016-08-28 DIAGNOSIS — I5022 Chronic systolic (congestive) heart failure: Secondary | ICD-10-CM | POA: Diagnosis not present

## 2016-08-29 ENCOUNTER — Other Ambulatory Visit: Payer: Self-pay | Admitting: Internal Medicine

## 2016-08-29 DIAGNOSIS — Z7901 Long term (current) use of anticoagulants: Secondary | ICD-10-CM | POA: Diagnosis not present

## 2016-08-29 DIAGNOSIS — N183 Chronic kidney disease, stage 3 (moderate): Secondary | ICD-10-CM | POA: Diagnosis not present

## 2016-08-29 DIAGNOSIS — K219 Gastro-esophageal reflux disease without esophagitis: Secondary | ICD-10-CM | POA: Diagnosis not present

## 2016-08-29 DIAGNOSIS — I5033 Acute on chronic diastolic (congestive) heart failure: Secondary | ICD-10-CM | POA: Diagnosis not present

## 2016-08-29 DIAGNOSIS — Z950 Presence of cardiac pacemaker: Secondary | ICD-10-CM | POA: Diagnosis not present

## 2016-08-29 DIAGNOSIS — Z86718 Personal history of other venous thrombosis and embolism: Secondary | ICD-10-CM | POA: Diagnosis not present

## 2016-08-29 DIAGNOSIS — I5022 Chronic systolic (congestive) heart failure: Secondary | ICD-10-CM | POA: Diagnosis not present

## 2016-08-29 DIAGNOSIS — I5021 Acute systolic (congestive) heart failure: Secondary | ICD-10-CM | POA: Diagnosis not present

## 2016-08-29 DIAGNOSIS — I130 Hypertensive heart and chronic kidney disease with heart failure and stage 1 through stage 4 chronic kidney disease, or unspecified chronic kidney disease: Secondary | ICD-10-CM | POA: Diagnosis not present

## 2016-08-30 DIAGNOSIS — I5022 Chronic systolic (congestive) heart failure: Secondary | ICD-10-CM | POA: Diagnosis not present

## 2016-08-31 DIAGNOSIS — I5022 Chronic systolic (congestive) heart failure: Secondary | ICD-10-CM | POA: Diagnosis not present

## 2016-09-01 DIAGNOSIS — I5022 Chronic systolic (congestive) heart failure: Secondary | ICD-10-CM | POA: Diagnosis not present

## 2016-09-02 DIAGNOSIS — I5022 Chronic systolic (congestive) heart failure: Secondary | ICD-10-CM | POA: Diagnosis not present

## 2016-09-03 DIAGNOSIS — I5022 Chronic systolic (congestive) heart failure: Secondary | ICD-10-CM | POA: Diagnosis not present

## 2016-09-04 ENCOUNTER — Other Ambulatory Visit (HOSPITAL_COMMUNITY): Payer: Self-pay | Admitting: Internal Medicine

## 2016-09-04 DIAGNOSIS — I5022 Chronic systolic (congestive) heart failure: Secondary | ICD-10-CM | POA: Diagnosis not present

## 2016-09-05 DIAGNOSIS — I130 Hypertensive heart and chronic kidney disease with heart failure and stage 1 through stage 4 chronic kidney disease, or unspecified chronic kidney disease: Secondary | ICD-10-CM | POA: Diagnosis not present

## 2016-09-05 DIAGNOSIS — Z7901 Long term (current) use of anticoagulants: Secondary | ICD-10-CM | POA: Diagnosis not present

## 2016-09-05 DIAGNOSIS — K219 Gastro-esophageal reflux disease without esophagitis: Secondary | ICD-10-CM | POA: Diagnosis not present

## 2016-09-05 DIAGNOSIS — N183 Chronic kidney disease, stage 3 (moderate): Secondary | ICD-10-CM | POA: Diagnosis not present

## 2016-09-05 DIAGNOSIS — I5022 Chronic systolic (congestive) heart failure: Secondary | ICD-10-CM | POA: Diagnosis not present

## 2016-09-05 DIAGNOSIS — Z950 Presence of cardiac pacemaker: Secondary | ICD-10-CM | POA: Diagnosis not present

## 2016-09-05 DIAGNOSIS — Z86718 Personal history of other venous thrombosis and embolism: Secondary | ICD-10-CM | POA: Diagnosis not present

## 2016-09-05 DIAGNOSIS — I5033 Acute on chronic diastolic (congestive) heart failure: Secondary | ICD-10-CM | POA: Diagnosis not present

## 2016-09-06 DIAGNOSIS — I5022 Chronic systolic (congestive) heart failure: Secondary | ICD-10-CM | POA: Diagnosis not present

## 2016-09-07 DIAGNOSIS — I5022 Chronic systolic (congestive) heart failure: Secondary | ICD-10-CM | POA: Diagnosis not present

## 2016-09-08 DIAGNOSIS — I5022 Chronic systolic (congestive) heart failure: Secondary | ICD-10-CM | POA: Diagnosis not present

## 2016-09-09 DIAGNOSIS — I5022 Chronic systolic (congestive) heart failure: Secondary | ICD-10-CM | POA: Diagnosis not present

## 2016-09-10 DIAGNOSIS — I5022 Chronic systolic (congestive) heart failure: Secondary | ICD-10-CM | POA: Diagnosis not present

## 2016-09-11 DIAGNOSIS — I5022 Chronic systolic (congestive) heart failure: Secondary | ICD-10-CM | POA: Diagnosis not present

## 2016-09-12 ENCOUNTER — Encounter (HOSPITAL_COMMUNITY): Payer: Medicare HMO

## 2016-09-12 DIAGNOSIS — I5021 Acute systolic (congestive) heart failure: Secondary | ICD-10-CM | POA: Diagnosis not present

## 2016-09-12 DIAGNOSIS — I5033 Acute on chronic diastolic (congestive) heart failure: Secondary | ICD-10-CM | POA: Diagnosis not present

## 2016-09-12 DIAGNOSIS — N183 Chronic kidney disease, stage 3 (moderate): Secondary | ICD-10-CM | POA: Diagnosis not present

## 2016-09-12 DIAGNOSIS — I5022 Chronic systolic (congestive) heart failure: Secondary | ICD-10-CM | POA: Diagnosis not present

## 2016-09-12 DIAGNOSIS — Z7901 Long term (current) use of anticoagulants: Secondary | ICD-10-CM | POA: Diagnosis not present

## 2016-09-12 DIAGNOSIS — Z950 Presence of cardiac pacemaker: Secondary | ICD-10-CM | POA: Diagnosis not present

## 2016-09-12 DIAGNOSIS — Z86718 Personal history of other venous thrombosis and embolism: Secondary | ICD-10-CM | POA: Diagnosis not present

## 2016-09-12 DIAGNOSIS — I130 Hypertensive heart and chronic kidney disease with heart failure and stage 1 through stage 4 chronic kidney disease, or unspecified chronic kidney disease: Secondary | ICD-10-CM | POA: Diagnosis not present

## 2016-09-12 DIAGNOSIS — K219 Gastro-esophageal reflux disease without esophagitis: Secondary | ICD-10-CM | POA: Diagnosis not present

## 2016-09-13 ENCOUNTER — Ambulatory Visit (HOSPITAL_COMMUNITY)
Admission: RE | Admit: 2016-09-13 | Discharge: 2016-09-13 | Disposition: A | Discharge status: Home / Self Care | Payer: Medicare HMO | Source: Ambulatory Visit | Attending: Cardiology | Admitting: Cardiology

## 2016-09-13 ENCOUNTER — Encounter: Payer: Self-pay | Admitting: Internal Medicine

## 2016-09-13 VITALS — BP 110/56 | HR 72 | Wt 145.4 lb

## 2016-09-13 DIAGNOSIS — J449 Chronic obstructive pulmonary disease, unspecified: Secondary | ICD-10-CM | POA: Diagnosis not present

## 2016-09-13 DIAGNOSIS — Z885 Allergy status to narcotic agent status: Secondary | ICD-10-CM | POA: Diagnosis not present

## 2016-09-13 DIAGNOSIS — I5022 Chronic systolic (congestive) heart failure: Secondary | ICD-10-CM | POA: Insufficient documentation

## 2016-09-13 DIAGNOSIS — Z79899 Other long term (current) drug therapy: Secondary | ICD-10-CM | POA: Insufficient documentation

## 2016-09-13 DIAGNOSIS — N183 Chronic kidney disease, stage 3 unspecified: Secondary | ICD-10-CM

## 2016-09-13 DIAGNOSIS — I959 Hypotension, unspecified: Secondary | ICD-10-CM

## 2016-09-13 DIAGNOSIS — Z7901 Long term (current) use of anticoagulants: Secondary | ICD-10-CM | POA: Diagnosis not present

## 2016-09-13 DIAGNOSIS — I130 Hypertensive heart and chronic kidney disease with heart failure and stage 1 through stage 4 chronic kidney disease, or unspecified chronic kidney disease: Secondary | ICD-10-CM | POA: Insufficient documentation

## 2016-09-13 DIAGNOSIS — R001 Bradycardia, unspecified: Secondary | ICD-10-CM | POA: Insufficient documentation

## 2016-09-13 DIAGNOSIS — I2510 Atherosclerotic heart disease of native coronary artery without angina pectoris: Secondary | ICD-10-CM | POA: Diagnosis not present

## 2016-09-13 DIAGNOSIS — Z8249 Family history of ischemic heart disease and other diseases of the circulatory system: Secondary | ICD-10-CM | POA: Insufficient documentation

## 2016-09-13 DIAGNOSIS — I255 Ischemic cardiomyopathy: Secondary | ICD-10-CM | POA: Insufficient documentation

## 2016-09-13 DIAGNOSIS — I480 Paroxysmal atrial fibrillation: Secondary | ICD-10-CM | POA: Insufficient documentation

## 2016-09-13 DIAGNOSIS — Z79890 Hormone replacement therapy (postmenopausal): Secondary | ICD-10-CM | POA: Diagnosis not present

## 2016-09-13 DIAGNOSIS — Z8673 Personal history of transient ischemic attack (TIA), and cerebral infarction without residual deficits: Secondary | ICD-10-CM | POA: Insufficient documentation

## 2016-09-13 LAB — COMPREHENSIVE METABOLIC PANEL
ALBUMIN: 3 g/dL — AB (ref 3.5–5.0)
ALK PHOS: 67 U/L (ref 38–126)
ALT: 11 U/L — AB (ref 17–63)
AST: 25 U/L (ref 15–41)
Anion gap: 7 (ref 5–15)
BILIRUBIN TOTAL: 1.1 mg/dL (ref 0.3–1.2)
BUN: 27 mg/dL — AB (ref 6–20)
CALCIUM: 8.8 mg/dL — AB (ref 8.9–10.3)
CO2: 31 mmol/L (ref 22–32)
Chloride: 95 mmol/L — ABNORMAL LOW (ref 101–111)
Creatinine, Ser: 1.68 mg/dL — ABNORMAL HIGH (ref 0.61–1.24)
GFR calc Af Amer: 42 mL/min — ABNORMAL LOW (ref >60)
GFR calc non Af Amer: 37 mL/min — ABNORMAL LOW (ref >60)
GLUCOSE: 124 mg/dL — AB (ref 65–99)
POTASSIUM: 3.5 mmol/L (ref 3.5–5.1)
Sodium: 133 mmol/L — ABNORMAL LOW (ref 135–145)
TOTAL PROTEIN: 6.5 g/dL (ref 6.5–8.1)

## 2016-09-13 LAB — COOXEMETRY PANEL
CARBOXYHEMOGLOBIN: 1 % (ref 0.5–1.5)
Methemoglobin: 1.2 % (ref 0.0–1.5)
O2 SAT: 58 %
TOTAL HEMOGLOBIN: 13.4 g/dL (ref 12.0–16.0)

## 2016-09-13 NOTE — Progress Notes (Signed)
Patient ID: Trevor Simpson, male   DOB: Oct 06, 1933, 83 y.o.   MRN: [id number]    Advanced Heart Failure Clinic Note   PCP: Dr Maudie Mercury Pulmonology: Dr Chase Caller Cardiology: Dr Lovena Le  HPI: Trevor Simpson is a 83 y.o. year old with history of  COPD, symptomatic bradycardia, PAF, HTN, PAD, CVA, chronic systolic heart failure and ischemic cardiomyopathy (EF 20-25% echo 7/16)  s/p CRT-D on 09/26/13  Admitted in 6/15 with low-output. RHC as below. Started on milrinone. In July 2015 underwent CRT-D upgrade Mean RA 11 PA 53/24  Mean PCWP 22 CI 1.5  Echo (6/15): EF 15%, moderate central Trevor, mild to moderately decreased RV systolic function.   Milrinone previously stopped in September 2015 due to patient preference, despite borderline co-ox.  Admitted 12/1 -> 02/21/16 with CP and profound fatigue. RHC with depressed cardiac output. Started on milrinone. Titrated up to 0.375 mcg with continued low mixed venous sat.  Improved with diureses and milrinone. Coox 67% on discharge. Continue milrinone for home.   He presents today for regular follow up.  Feeling more fatigued and more SOB. SOB with any exertion and occasionally at rest. Weight at home 138-140. Appetite is ok. Denies lightheadedness or dizziness. Not able to exercise. Tired just getting around the house.  No fever, chills, N/V, or CP. No ICD shocks. Chronic cough with occasional clear production. Pt states he wants to try exercising more.   Corevue: Thoracic impedence above threshold. No VT/VF. AT/AF burden 1.7%  Review of systems complete and found to be negative unless listed in HPI.     [place] 12/1: RA = 11 RV = 56/14 PA = 55/26 (38) PCW = 27 mean V wave to 40 Fick cardiac output/index = 2.4/1.3 PVR = 4.5 WU Ao sat = 99% PA sat = 41%, 43%  LHC 08/2012  Left mainstem: The left main is long with 20% stenosis distally.  Left anterior descending (LAD): The left anterior descending artery is moderately calcified in the proximal vessel. There  is diffuse disease in the proximal vessel up to 30%. The first diagonal is relatively small and has mild disease proximally. The ramus intermediate branch is a large branch that bifurcates in the mid vessel. The proximal vessel has a 90-95% stenosis.  Left circumflex (LCx): The left circumflex is relatively small. It gives rise to a single marginal branch and then continues in the AV groove. The first marginal branch has diffuse 60-70% stenosis proximally.  Right coronary artery (RCA): The right coronary is occluded proximally. There are right to right and left to right collaterals. Severe two-vessel obstructive coronary disease. Chronic total occlusion of the right coronary Successful stenting of the ramus intermediate branch with a bare-metal stent  Labs:  7/14 AST 27 ALT 21  11/14 K 3.8 Creatinine  1.41  12/14 Pro BNP 1100 4/14: K 3.9, creatinine 1.4, BUN 22 6/15: K 4.1, creatinine 1.2 7/15: K 3.7, creatinine 1.36, HCT 43.7, co-ox 66.5% 8/15: digoxin 1.2, co-ox 55%, K 4.0, creatinine 1.36 9/15: co-ox 56.8% 11/15 K 3.8, creatinine 1.25 10/17/14: K 3.3 creatinine 1.34   SH: Lives at home with wife  FH: Father, Brother - CAD HTN, DM        Sister - lung cancer     Current Outpatient Prescriptions  Medication Sig Dispense Refill  . ALPRAZolam (XANAX) 0.25 MG tablet Take 0.375 mg by mouth at bedtime.    Marland Kitchen amiodarone (PACERONE) 200 MG tablet Take 1 tablet (200 mg total) by mouth daily. [place]  tablet 3  . apixaban (ELIQUIS) 2.5 MG TABS tablet Take 1 tablet (2.5 mg total) by mouth 2 (two) times daily. 60 tablet 3  . atorvastatin (LIPITOR) 20 MG tablet Take 20 mg by mouth daily at 6 PM.     . B Complex-C (SUPER B COMPLEX) TABS Take 1 tablet by mouth daily.     . calcium carbonate (OS-CAL) 600 MG TABS Take 600 mg by mouth daily with breakfast.     . cetirizine (ZYRTEC) 10 MG tablet Take 10 mg by mouth daily.     . digoxin (LANOXIN) 0.125 MG tablet Take 0.5 tablets (0.0625 mg total) by mouth  daily. 90 tablet 3  . doxylamine, Sleep, (UNISOM) 25 MG tablet Take 25 mg by mouth at bedtime.    . fluticasone (FLONASE) 50 MCG/ACT nasal spray Place 1 spray into both nostrils daily as needed for allergies. Reported on 04/11/2015    . furosemide (LASIX) 80 MG tablet Take 80 mg by mouth. 80 mg in the evening and 40 mg at bedtime    . levothyroxine (SYNTHROID, LEVOTHROID) 112 MCG tablet Take 112 mcg by mouth See admin instructions. Pt takes Monday- Saturday - skips on Sundays    . magnesium gluconate (MAGONATE) 500 MG tablet Take 500 mg by mouth daily.     . milrinone (PRIMACOR) 20 MG/100 ML SOLN infusion Inject 26.775 mcg/min into the vein continuous.    . Multiple Vitamin (MULTIVITAMIN WITH MINERALS) TABS Take 1 tablet by mouth daily.    . Omega-3 Fatty Acids (FISH OIL) 1200 MG CAPS Take 1,200 mg by mouth daily.     Marland Kitchen omeprazole (PRILOSEC) 20 MG capsule Take 20 mg by mouth daily as needed (indigestion).     . potassium chloride SA (KLOR-CON M20) 20 MEQ tablet Take 2 tablets (40 mEq total) by mouth 2 (two) times daily. 120 tablet 2  . Saw Palmetto, Serenoa repens, (SAW PALMETTO PO) Take 1 capsule by mouth 2 (two) times daily.    Marland Kitchen senna (SENOKOT) 8.6 MG TABS tablet Take 1 tablet by mouth daily as needed for mild constipation.    Marland Kitchen umeclidinium bromide (INCRUSE ELLIPTA) 62.5 MCG/INH AEPB Inhale 1 puff into the lungs daily. 7 each 0  . vitamin C (ASCORBIC ACID) 500 MG tablet Take 500 mg by mouth daily.      . nitroGLYCERIN (NITROSTAT) 0.4 MG SL tablet Place 1 tablet (0.4 mg total) under the tongue every 5 (five) minutes as needed for chest pain. (Patient not taking: Reported on 09/13/2016) 25 tablet 3   No current facility-administered medications for this encounter.      Allergies  Allergen Reactions  . Codeine     "tripped out on it"  . Morphine     "tripped out on it"    Vitals:   09/13/16 1152  BP: (!) 110/56  Pulse: 72  SpO2: 93%  Weight: 145 lb 6.4 oz (66 kg)   Wt Readings from  Last 3 Encounters:  09/13/16 145 lb 6.4 oz (66 kg)  08/15/16 146 lb 2 oz (66.3 kg)  07/17/16 146 lb (66.2 kg)     PHYSICAL EXAM: General: Elderly and fatigued appearing. NAD at rest. NOT dyspneic with conversation.  HEENT: Thin. Temporal wasting.  Neck: Supple. JVP 6-7 cm.  2+ bilat; no bruits. No thyromegaly or nodule noted. Cor: PMI nondisplaced. RRR, 2/6 TR, 2/6 HSM apex. + S3. RIJ tunneled PICC.  Lungs: CTAB, normal effort.  Abdomen: Soft, non-tender, non-distended, no HSM. No bruits or  masses. +BS  Extremities: No cyanosis, clubbing, rash, R and LLE no edema. Cool. Neuro: Alert & orientedx3, cranial nerves grossly intact. moves all 4 extremities w/o difficulty. Affect pleasant   ASSESSMENT & PLAN: 1. Chronic Systolic Heart Failure: Ischemic cardiomyopathy s/p CRT-D; Echo 6/15 with EF 15%, moderate Trevor, mild to moderately decreased RV systolic function. Echo 7/16 EF 20-25% mild Trevor/AI. RV mild dysfunction  RHC in 6/15 showed CI 1.5 so patient was started on milrinone.  Milrinone stopped in 11/2013.  Improved after CRT optimization. Milrinone resumed 12/17 - Chronic NYHA IIIb symptoms.  - Check Coox today. He continues to have severe fatigue on milrinone 0.375 mcg/kg/min. Not a candidate for advanced therapies given age and co-morbidities.  - Volume status stable on exam and Corevue. Continue lasix 80 mg q am and 40 mg q pm. Can cut out evening dose as needed.  - Echo 01/19/16 LVEF 25%, RV mod reduced - Off BB with low output.  - Does not tolerate addition of ARB with hypotension. No ivabradine with HR usually < 70 and PAF - Reinforced fluid restriction to < 2 L daily, sodium restriction to less than 2000 mg daily, and the importance of daily weights.   2. PAF: S/P DC-CV 09/2012.   - Had recurrent AF on Saturday 2/24. Will severe symptoms. No AF on device.  - Continue amiodarone 200 mg daily.  - Continue Eliquis 2.5 BID. Denies bleeding.  - Recent LFTs and TFTs stable. No change.  - No  change. 3. CAD: Extensive disease burden by cath 08/2012. - No ischemic symptoms. No change to medical regimen as below.  - Continue statin. Lipids per PCP.   - No aspirin with relatively stable CAD and use of Eliquis 4. CKD stage III - CMET today.   5. Hypotension - Stable. Will not up-titrate meds.   Will not uptitrate meds with soft pressures and h/o of intolerance with orthostasis.  Long discussion with family today.  Pt has end stage HF and continues to become more fatigued and symptomatic.  They are unwilling to consider Palliative Care or Hospice care at this time.  They understand that Milrinone is solely palliative and will not improve his heart function. Family and patient verbalize understand.   Pt and wife having difficulty coming to terms with his prognosis, but daughter understands. I have again encouraged a family meeting at home (Pt and wife have previously asked to keep these conversations private and to not have a provider involved.)   Suspect pt has prognosis of <6 months. Have recommended palliative and hospice consideration, but patient wishes to defer for now.  Labs today. RTC 6-8 weeks with MD.   Shirley Friar, PA-C  1:39 PM  Greater than 50% of the 25 minute visit was spent in counseling/coordination of care regarding disease state education, medication reconciliation, review of plan with MD, sliding scale diuretics, and discussion of medical regimen with on-site Pharm-D.

## 2016-09-13 NOTE — Patient Instructions (Signed)
Labs today We will only contact you if something comes back abnormal or we need to make some changes. Otherwise no news is good news!  Your physician recommends that you schedule a follow-up appointment in: [place]  Do the following things EVERYDAY: 1) Weigh yourself in the morning before breakfast. Write it down and keep it in a log. 2) Take your medicines as prescribed 3) Eat low salt foods-Limit salt (sodium) to 2000 mg per day.  4) Stay as active as you can everyday 5) Limit all fluids for the day to less than 2 liters

## 2016-09-14 ENCOUNTER — Other Ambulatory Visit (HOSPITAL_COMMUNITY): Payer: Self-pay | Admitting: Internal Medicine

## 2016-09-14 DIAGNOSIS — I5022 Chronic systolic (congestive) heart failure: Secondary | ICD-10-CM | POA: Diagnosis not present

## 2016-09-15 DIAGNOSIS — I5022 Chronic systolic (congestive) heart failure: Secondary | ICD-10-CM | POA: Diagnosis not present

## 2016-09-16 DIAGNOSIS — I5022 Chronic systolic (congestive) heart failure: Secondary | ICD-10-CM | POA: Diagnosis not present

## 2016-09-17 DIAGNOSIS — I5022 Chronic systolic (congestive) heart failure: Secondary | ICD-10-CM | POA: Diagnosis not present

## 2016-09-18 ENCOUNTER — Encounter: Payer: Self-pay | Admitting: Pulmonary Disease

## 2016-09-18 ENCOUNTER — Ambulatory Visit (INDEPENDENT_AMBULATORY_CARE_PROVIDER_SITE_OTHER): Payer: Medicare HMO | Admitting: Pulmonary Disease

## 2016-09-18 ENCOUNTER — Ambulatory Visit (INDEPENDENT_AMBULATORY_CARE_PROVIDER_SITE_OTHER)
Admission: RE | Admit: 2016-09-18 | Discharge: 2016-09-18 | Disposition: A | Discharge status: Home / Self Care | Payer: Medicare HMO | Source: Ambulatory Visit | Attending: Pulmonary Disease | Admitting: Pulmonary Disease

## 2016-09-18 VITALS — BP 112/60 | HR 73 | Ht 69.0 in | Wt 148.6 lb

## 2016-09-18 DIAGNOSIS — I5022 Chronic systolic (congestive) heart failure: Secondary | ICD-10-CM | POA: Diagnosis not present

## 2016-09-18 DIAGNOSIS — J439 Emphysema, unspecified: Secondary | ICD-10-CM | POA: Diagnosis not present

## 2016-09-18 DIAGNOSIS — R0602 Shortness of breath: Secondary | ICD-10-CM

## 2016-09-18 MED ORDER — UMECLIDINIUM-VILANTEROL 62.5-25 MCG/INH IN AEPB: 1.0000 | INHALATION_SPRAY | Freq: Every day | RESPIRATORY_TRACT | 0 refills | Status: AC

## 2016-09-18 NOTE — Patient Instructions (Signed)
We'll give samples of [place] to try. Please let us know if this helps with his symptoms and will see if he can get further medications We'll get a chest x-ray to make sure there is no pneumonia  Follow-up with Dr. Chase Caller

## 2016-09-18 NOTE — Progress Notes (Signed)
Trevor Simpson    [id number]    08/13/1933  Primary Care Physician:Kim, Jeneen Rinks, MD  Referring Physician: [name], MD [address] [place], [place] [address]  Chief complaint:  Acute visit for dyspnea, fatigue  HPI: 83 year old with emphysema, no obstruction on PFTs, advanced chronic systolic heart failure on milrinone. He has complains of dyspnea on exertion for the past few weeks. He has no significant symptoms at rest. He has occasional cough with white mucus production. No wheezing, fevers, chills. He was seen at heart failure clinic last week where it was noted that he has end-stage heart failure. There was no titration of medication and suggestion was made to consider hospice, palliative care.  He follows with Dr. Chase Caller. He was on incruse but stopped as he cannot afford the medication. He did not notice any change in symptoms while on the inhaler.   Outpatient Encounter Prescriptions as of 09/18/2016  Medication Sig  . ALPRAZolam (XANAX) 0.25 MG tablet Take 0.375 mg by mouth at bedtime.  Marland Kitchen amiodarone (PACERONE) 200 MG tablet Take 1 tablet (200 mg total) by mouth daily.  Marland Kitchen apixaban (ELIQUIS) 2.5 MG TABS tablet Take 1 tablet (2.5 mg total) by mouth 2 (two) times daily.  Marland Kitchen atorvastatin (LIPITOR) 20 MG tablet Take 20 mg by mouth daily at 6 PM.   . B Complex-C (SUPER B COMPLEX) TABS Take 1 tablet by mouth daily.   . calcium carbonate (OS-CAL) 600 MG TABS Take 600 mg by mouth daily with breakfast.   . cetirizine (ZYRTEC) 10 MG tablet Take 10 mg by mouth daily.   . digoxin (LANOXIN) 0.125 MG tablet Take 0.5 tablets (0.0625 mg total) by mouth daily.  Marland Kitchen doxylamine, Sleep, (UNISOM) 25 MG tablet Take 25 mg by mouth at bedtime.  . fluticasone (FLONASE) 50 MCG/ACT nasal spray Place 1 spray into both nostrils daily as needed for allergies. Reported on 04/11/2015  . furosemide (LASIX) 80 MG tablet Take 80 mg by mouth. 80 mg in the evening and 40 mg at bedtime  .  levothyroxine (SYNTHROID, LEVOTHROID) 112 MCG tablet Take 112 mcg by mouth See admin instructions. Pt takes Monday- Saturday - skips on Sundays  . magnesium gluconate (MAGONATE) 500 MG tablet Take 500 mg by mouth daily.   . milrinone (PRIMACOR) 20 MG/100 ML SOLN infusion Inject 26.775 mcg/min into the vein continuous.  . Multiple Vitamin (MULTIVITAMIN WITH MINERALS) TABS Take 1 tablet by mouth daily.  . nitroGLYCERIN (NITROSTAT) 0.4 MG SL tablet Place 1 tablet (0.4 mg total) under the tongue every 5 (five) minutes as needed for chest pain.  . Omega-3 Fatty Acids (FISH OIL) 1200 MG CAPS Take 1,200 mg by mouth daily.   Marland Kitchen omeprazole (PRILOSEC) 20 MG capsule Take 20 mg by mouth daily as needed (indigestion).   . potassium chloride SA (KLOR-CON M20) 20 MEQ tablet Take 2 tablets (40 mEq total) by mouth 2 (two) times daily.  . Saw Palmetto, Serenoa repens, (SAW PALMETTO PO) Take 1 capsule by mouth 2 (two) times daily.  Marland Kitchen senna (SENOKOT) 8.6 MG TABS tablet Take 1 tablet by mouth daily as needed for mild constipation.  . vitamin C (ASCORBIC ACID) 500 MG tablet Take 500 mg by mouth daily.    . [DISCONTINUED] umeclidinium bromide (INCRUSE ELLIPTA) 62.5 MCG/INH AEPB Inhale 1 puff into the lungs daily.   No facility-administered encounter medications on file as of 09/18/2016.     Allergies as of 09/18/2016 -  Review Complete 09/18/2016  Allergen Reaction Noted  . Codeine    . Morphine      Past Medical History:  Diagnosis Date  . AAA (abdominal aortic aneurysm) ([place])    repaired  . Anxiety   . Arthritis    "left elbow and shoulder" (09/24/2013)  . Atrial fibrillation ([place])   . Automatic implantable cardioverter-defibrillator in situ   . Carotid artery occlusion   . Colon polyp   . Congestive heart failure ([place])   . Coronary artery disease    a. LHC (08/2012): Lmain: long, 20% stenosis distally LAD: mod calcified and mild dz prox. diff dz proximally 30%, first diagonal mild dz prox and small, Ramus  intermediate brach lg bifurcates mid vessel prox. 90-95% stenosis LCx: relatively sml, rise to single marginal and contiine AV groove, 1st marg. diff 60-70% dz stenosis prox RCA: occ. prox. R-L coll, successful stent remus interm branch BMS  . GERD (gastroesophageal reflux disease)   . H/O blood clots   . HCAP (healthcare-associated pneumonia) 2014  . Hyperlipidemia   . Hypertension   . Hypothyroidism   . Ischemic cardiomyopathy   . Myocardial infarction ([place])   . Other and unspecified hyperlipidemia   . Pneumonia   . PVD (peripheral vascular disease) ([place])    s/p renal artery stent 20-04  . Sinoatrial node dysfunction ([facility])   . Stroke [facility]) 2014   left sided affected no residual effects  . Systolic congestive heart failure ([place])    a. ICM b.     Past Surgical History:  Procedure Laterality Date  . ABDOMINAL AORTIC ANEURYSM REPAIR  05/17/1993  . BI-VENTRICULAR IMPLANTABLE CARDIOVERTER DEFIBRILLATOR N/A 09/24/2013   Procedure: BI-VENTRICULAR IMPLANTABLE CARDIOVERTER DEFIBRILLATOR  (CRT-D);  Surgeon: Evans Lance, MD;  Location: [facility] CATH LAB;  Service: Cardiovascular;  Laterality: N/A;  . BI-VENTRICULAR IMPLANTABLE CARDIOVERTER DEFIBRILLATOR  (CRT-D)  09/24/2013  . CARDIAC CATHETERIZATION N/A 02/17/2016   Procedure: Right Heart Cath;  Surgeon: Jolaine Artist, MD;  Location: [place] CV LAB;  Service: Cardiovascular;  Laterality: N/A;  . CARDIAC PACEMAKER PLACEMENT  08/01/2009   st jude dual chamber; explanted 09/24/2013  . CARDIOVERSION N/A 09/23/2012   Procedure: CARDIOVERSION;  Surgeon: Lelon Perla, MD;  Location: [facility] ENDOSCOPY;  Service: Cardiovascular;  Laterality: N/A;  . CAROTID ENDARTERECTOMY Right 11-25-12  . CATARACT EXTRACTION W/ INTRAOCULAR LENS  IMPLANT, BILATERAL Bilateral   . CENTRAL VENOUS CATHETER INSERTION  02/17/2016   Procedure: Insertion Central Line Adult;  Surgeon: Jolaine Artist, MD;  Location: [place] CV LAB;  Service: Cardiovascular;;  . COLONOSCOPY      . CORONARY ANGIOPLASTY WITH STENT PLACEMENT  08/2012   "1"  . CORONARY ARTERY BYPASS GRAFT  05/17/1993   "CABG X4"  . Defribrillator    . ENDARTERECTOMY Right 11/25/2012   Procedure: ENDARTERECTOMY CAROTID With Dacron Patch Angioplasty;  Surgeon: Elam Dutch, MD;  Location: [place];  Service: Vascular;  Laterality: Right;  . ESOPHAGOGASTRODUODENOSCOPY    . INGUINAL HERNIA REPAIR Bilateral    "one at a time"  . IR GENERIC HISTORICAL  02/20/2016   IR US GUIDE VASC ACCESS RIGHT 02/20/2016 Arne Cleveland, MD [facility]INTER[facility]  . IR GENERIC HISTORICAL  02/20/2016   IR FLUORO GUIDE CV LINE RIGHT 02/20/2016 Arne Cleveland, MD [facility]  . LEFT AND RIGHT HEART CATHETERIZATION WITH CORONARY ANGIOGRAM N/A 09/15/2012   Procedure: LEFT AND RIGHT HEART CATHETERIZATION WITH CORONARY ANGIOGRAM;  Surgeon: Peter M Martinique, MD;  Location: [facility] CATH LAB;  Service: Cardiovascular;  Laterality: N/A;  . PERCUTANEOUS CORONARY STENT INTERVENTION (PCI-S)  09/15/2012   Procedure: PERCUTANEOUS CORONARY STENT INTERVENTION (PCI-S);  Surgeon: Peter M Martinique, MD;  Location: [facility] CATH LAB;  Service: Cardiovascular;;  . RENAL ARTERY STENT    . RIGHT HEART CATHETERIZATION N/A 08/31/2013   Procedure: RIGHT HEART CATH;  Surgeon: Jolaine Artist, MD;  Location: [facility] CATH LAB;  Service: Cardiovascular;  Laterality: N/A;  . TEE WITHOUT CARDIOVERSION N/A 09/23/2012   Procedure: TRANSESOPHAGEAL ECHOCARDIOGRAM (TEE);  Surgeon: Lelon Perla, MD;  Location: [facility] ENDOSCOPY;  Service: Cardiovascular;  Laterality: N/A;  . TRANSLUMINAL ANGIOPLASTY      Family History  Problem Relation Age of Onset  . CAD Father   . Hypertension Father   . Heart disease Father   . CAD Brother   . Heart disease Brother   . Heart attack Brother   . Diabetes Brother   . Hypertension Mother   . Hypertension Daughter   . Vascular Disease Daughter        Right Carotid  . Colon cancer Son   . Lung cancer Sister   . Heart disease Sister   . Hypertension  Sister   . Colon polyps Brother        x2    Social History   Social History  . Marital status: Married    Spouse name: N/A  . Number of children: 4  . Years of education: N/A   Occupational History  . Retired    Social History Main Topics  . Smoking status: Former Smoker    Packs/day: 1.00    Years: 50.00    Types: Cigarettes    Quit date: 05/17/1993  . Smokeless tobacco: Current User    Types: Chew  . Alcohol use No  . Drug use: No  . Sexual activity: Not Currently   Other Topics Concern  . Not on file   Social History Narrative  . No narrative on file    Review of systems: Review of Systems  Constitutional: Negative for fever and chills.  HENT: Negative.   Eyes: Negative for blurred vision.  Respiratory: as per HPI  Cardiovascular: Negative for chest pain and palpitations.  Gastrointestinal: Negative for vomiting, diarrhea, blood per rectum. Genitourinary: Negative for dysuria, urgency, frequency and hematuria.  Musculoskeletal: Negative for myalgias, back pain and joint pain.  Skin: Negative for itching and rash.  Neurological: Negative for dizziness, tremors, focal weakness, seizures and loss of consciousness.  Endo/Heme/Allergies: Negative for environmental allergies.  Psychiatric/Behavioral: Negative for depression, suicidal ideas and hallucinations.  All other systems reviewed and are negative.  Physical Exam: Blood pressure 112/60, pulse 73, height 5\' 9"  (1.753 m), weight 148 lb 9.6 oz (67.4 kg), SpO2 94 %. Gen:      No acute distress HEENT:  EOMI, sclera anicteric Neck:     No masses; no thyromegaly Lungs:    Clear to auscultation bilaterally; normal respiratory effort CV:         Regular rate and rhythm; no murmurs Abd:      + bowel sounds; soft, non-tender; no palpable masses, no distension Ext:    1-2+ edema; adequate peripheral perfusion Skin:      Warm and dry; no rash Neuro: alert and oriented x 3 Psych: normal mood and affect  Data  Reviewed: CT chest 03/03/14-  diffuse centrilobular emphysema, right middle lobe and lingular scarring.  Chest x-ray 3/6/1 8-cardiomegaly, hyperinflated lungs. I had reviewed all images personally  PFTs before  12/1/1 5 CC 3.65 [93%) FEV1 3.0 to [108%) F/F 83 TLC 118% DLCO 53% Moderate-severe diffusion defect  Assessment:  Emphysema Mr. Marcelino Scot has emphysematous changes on CT scan but no obstruction on PFTs. He is here for progressive dyspnea on exertion, fatigue. I don't think his emphysema is playing a major role in his symptoms. He is not wheezing in the office today and there is no evidence of infection. I don't think he needs steroids or antibiotics.  I'll get a chest x-ray to make sure there is no bronchitis or pneumonia. We will give samples of Anoro but I'm not sure if this will help with his symptoms. He cannot afford long-term use of inhalers and they have not made a difference in the past.  End-stage heart failure His symptoms are most likely from progressive heart failure. We rediscussed the recommendations from the heart failure team that he consider palliative care and hospice, which is appropriate. His daughter seems to understand the situation but he and his wife are still reluctant. They will meet as a family to discuss this further.  Plan/Recommendations: - CXR - Samples of anoro  More then 1/2 the time of the 40 min visit was spent in counseling and/or coordination of care with the patient and family.  Marshell Garfinkel MD [facility] Pager [phone] 09/18/2016, 12:43 PM  CC: [name], MD

## 2016-09-19 DIAGNOSIS — Z86718 Personal history of other venous thrombosis and embolism: Secondary | ICD-10-CM | POA: Diagnosis not present

## 2016-09-19 DIAGNOSIS — Z950 Presence of cardiac pacemaker: Secondary | ICD-10-CM | POA: Diagnosis not present

## 2016-09-19 DIAGNOSIS — I5022 Chronic systolic (congestive) heart failure: Secondary | ICD-10-CM | POA: Diagnosis not present

## 2016-09-19 DIAGNOSIS — N183 Chronic kidney disease, stage 3 (moderate): Secondary | ICD-10-CM | POA: Diagnosis not present

## 2016-09-19 DIAGNOSIS — K219 Gastro-esophageal reflux disease without esophagitis: Secondary | ICD-10-CM | POA: Diagnosis not present

## 2016-09-19 DIAGNOSIS — I130 Hypertensive heart and chronic kidney disease with heart failure and stage 1 through stage 4 chronic kidney disease, or unspecified chronic kidney disease: Secondary | ICD-10-CM | POA: Diagnosis not present

## 2016-09-19 DIAGNOSIS — Z7901 Long term (current) use of anticoagulants: Secondary | ICD-10-CM | POA: Diagnosis not present

## 2016-09-19 DIAGNOSIS — I5033 Acute on chronic diastolic (congestive) heart failure: Secondary | ICD-10-CM | POA: Diagnosis not present

## 2016-09-20 DIAGNOSIS — J449 Chronic obstructive pulmonary disease, unspecified: Secondary | ICD-10-CM | POA: Diagnosis not present

## 2016-09-20 DIAGNOSIS — I5022 Chronic systolic (congestive) heart failure: Secondary | ICD-10-CM | POA: Diagnosis not present

## 2016-09-26 DIAGNOSIS — K219 Gastro-esophageal reflux disease without esophagitis: Secondary | ICD-10-CM | POA: Diagnosis not present

## 2016-09-26 DIAGNOSIS — Z86718 Personal history of other venous thrombosis and embolism: Secondary | ICD-10-CM | POA: Diagnosis not present

## 2016-09-26 DIAGNOSIS — Z7901 Long term (current) use of anticoagulants: Secondary | ICD-10-CM | POA: Diagnosis not present

## 2016-09-26 DIAGNOSIS — I5033 Acute on chronic diastolic (congestive) heart failure: Secondary | ICD-10-CM | POA: Diagnosis not present

## 2016-09-26 DIAGNOSIS — N183 Chronic kidney disease, stage 3 (moderate): Secondary | ICD-10-CM | POA: Diagnosis not present

## 2016-09-26 DIAGNOSIS — I5022 Chronic systolic (congestive) heart failure: Secondary | ICD-10-CM | POA: Diagnosis not present

## 2016-09-26 DIAGNOSIS — I5021 Acute systolic (congestive) heart failure: Secondary | ICD-10-CM | POA: Diagnosis not present

## 2016-09-26 DIAGNOSIS — Z950 Presence of cardiac pacemaker: Secondary | ICD-10-CM | POA: Diagnosis not present

## 2016-09-26 DIAGNOSIS — I130 Hypertensive heart and chronic kidney disease with heart failure and stage 1 through stage 4 chronic kidney disease, or unspecified chronic kidney disease: Secondary | ICD-10-CM | POA: Diagnosis not present

## 2016-10-01 ENCOUNTER — Telehealth: Payer: Self-pay | Admitting: Internal Medicine

## 2016-10-01 MED ORDER — DOXYCYCLINE HYCLATE 100 MG PO TABS: 100.0000 mg | ORAL_TABLET | Freq: Two times a day (BID) | ORAL | 0 refills | Status: DC

## 2016-10-01 MED ORDER — PREDNISONE 10 MG PO TABS: ORAL_TABLET | ORAL | 0 refills | Status: DC

## 2016-10-01 NOTE — Telephone Encounter (Signed)
There is a drug to drug with digoxin, amiodarone and zpak.  PM please advise. Thanks.

## 2016-10-01 NOTE — Telephone Encounter (Signed)
PM  Please Advise-sick message  You saw this patient on 09/18/16. Pt's daughter called in wanting the results of patient's xray report. She is concerned because patient is still having increase sob, he is coughing still but this time has a little bit of blood mixed in his mucus, and he has low energy.  Denies wheezing,fever.  Allergies  Allergen Reactions  . Codeine     "tripped out on it"  . Morphine     "tripped out on it"   Pharmacy: [facility]

## 2016-10-01 NOTE — Telephone Encounter (Signed)
Spoke with daughter who was informed in the medication change. Rx for prednisone and doxy was sent into the pharmacy. She had no further questions at this time. Nothing further is needed

## 2016-10-01 NOTE — Telephone Encounter (Signed)
CXR shows congestion and atelectasis. There is no clear evidence of pneumonia on CXR. However if he is bringing up blood we will treat for bronchitis and COPD exacerbation. Please call in Z pack and pred taper. starting at 40 mg. Reduce dose by 10 mg every 2 days. Go to ED if symptoms worsen  Marshell Garfinkel MD

## 2016-10-01 NOTE — Telephone Encounter (Signed)
Doxycycline 100mg  bid for 7 days. Thanks

## 2016-10-03 DIAGNOSIS — N183 Chronic kidney disease, stage 3 (moderate): Secondary | ICD-10-CM | POA: Diagnosis not present

## 2016-10-03 DIAGNOSIS — I5033 Acute on chronic diastolic (congestive) heart failure: Secondary | ICD-10-CM | POA: Diagnosis not present

## 2016-10-03 DIAGNOSIS — K219 Gastro-esophageal reflux disease without esophagitis: Secondary | ICD-10-CM | POA: Diagnosis not present

## 2016-10-03 DIAGNOSIS — I130 Hypertensive heart and chronic kidney disease with heart failure and stage 1 through stage 4 chronic kidney disease, or unspecified chronic kidney disease: Secondary | ICD-10-CM | POA: Diagnosis not present

## 2016-10-03 DIAGNOSIS — Z950 Presence of cardiac pacemaker: Secondary | ICD-10-CM | POA: Diagnosis not present

## 2016-10-03 DIAGNOSIS — Z86718 Personal history of other venous thrombosis and embolism: Secondary | ICD-10-CM | POA: Diagnosis not present

## 2016-10-03 DIAGNOSIS — I5022 Chronic systolic (congestive) heart failure: Secondary | ICD-10-CM | POA: Diagnosis not present

## 2016-10-03 DIAGNOSIS — Z7901 Long term (current) use of anticoagulants: Secondary | ICD-10-CM | POA: Diagnosis not present

## 2016-10-08 ENCOUNTER — Encounter (HOSPITAL_COMMUNITY): Payer: Self-pay | Admitting: Nurse Practitioner

## 2016-10-08 ENCOUNTER — Inpatient Hospital Stay (HOSPITAL_COMMUNITY)
Admission: EM | Admit: 2016-10-08 | Discharge: 2016-10-12 | DRG: 193 | Disposition: A | Discharge status: Home Health | Payer: Medicare HMO | Attending: Internal Medicine | Admitting: Internal Medicine

## 2016-10-08 ENCOUNTER — Other Ambulatory Visit (HOSPITAL_COMMUNITY): Payer: Self-pay | Admitting: Internal Medicine

## 2016-10-08 ENCOUNTER — Emergency Department (HOSPITAL_COMMUNITY): Payer: Medicare HMO

## 2016-10-08 DIAGNOSIS — Z8249 Family history of ischemic heart disease and other diseases of the circulatory system: Secondary | ICD-10-CM

## 2016-10-08 DIAGNOSIS — J439 Emphysema, unspecified: Secondary | ICD-10-CM | POA: Diagnosis present

## 2016-10-08 DIAGNOSIS — J441 Chronic obstructive pulmonary disease with (acute) exacerbation: Secondary | ICD-10-CM | POA: Diagnosis present

## 2016-10-08 DIAGNOSIS — R627 Adult failure to thrive: Secondary | ICD-10-CM | POA: Diagnosis present

## 2016-10-08 DIAGNOSIS — Z8701 Personal history of pneumonia (recurrent): Secondary | ICD-10-CM

## 2016-10-08 DIAGNOSIS — I5084 End stage heart failure: Secondary | ICD-10-CM

## 2016-10-08 DIAGNOSIS — Z951 Presence of aortocoronary bypass graft: Secondary | ICD-10-CM

## 2016-10-08 DIAGNOSIS — Z8673 Personal history of transient ischemic attack (TIA), and cerebral infarction without residual deficits: Secondary | ICD-10-CM

## 2016-10-08 DIAGNOSIS — E039 Hypothyroidism, unspecified: Secondary | ICD-10-CM | POA: Diagnosis present

## 2016-10-08 DIAGNOSIS — J440 Chronic obstructive pulmonary disease with acute lower respiratory infection: Secondary | ICD-10-CM | POA: Diagnosis present

## 2016-10-08 DIAGNOSIS — I252 Old myocardial infarction: Secondary | ICD-10-CM

## 2016-10-08 DIAGNOSIS — N183 Chronic kidney disease, stage 3 unspecified: Secondary | ICD-10-CM | POA: Diagnosis present

## 2016-10-08 DIAGNOSIS — Z7901 Long term (current) use of anticoagulants: Secondary | ICD-10-CM

## 2016-10-08 DIAGNOSIS — N179 Acute kidney failure, unspecified: Secondary | ICD-10-CM | POA: Diagnosis not present

## 2016-10-08 DIAGNOSIS — I2510 Atherosclerotic heart disease of native coronary artery without angina pectoris: Secondary | ICD-10-CM | POA: Diagnosis present

## 2016-10-08 DIAGNOSIS — J9601 Acute respiratory failure with hypoxia: Secondary | ICD-10-CM | POA: Diagnosis not present

## 2016-10-08 DIAGNOSIS — I482 Chronic atrial fibrillation: Secondary | ICD-10-CM | POA: Diagnosis present

## 2016-10-08 DIAGNOSIS — Z86010 Personal history of colonic polyps: Secondary | ICD-10-CM

## 2016-10-08 DIAGNOSIS — L89322 Pressure ulcer of left buttock, stage 2: Secondary | ICD-10-CM | POA: Diagnosis present

## 2016-10-08 DIAGNOSIS — E876 Hypokalemia: Secondary | ICD-10-CM | POA: Diagnosis not present

## 2016-10-08 DIAGNOSIS — I10 Essential (primary) hypertension: Secondary | ICD-10-CM | POA: Diagnosis not present

## 2016-10-08 DIAGNOSIS — R05 Cough: Secondary | ICD-10-CM | POA: Diagnosis not present

## 2016-10-08 DIAGNOSIS — E785 Hyperlipidemia, unspecified: Secondary | ICD-10-CM | POA: Diagnosis present

## 2016-10-08 DIAGNOSIS — Y95 Nosocomial condition: Secondary | ICD-10-CM | POA: Diagnosis present

## 2016-10-08 DIAGNOSIS — K2980 Duodenitis without bleeding: Secondary | ICD-10-CM | POA: Diagnosis present

## 2016-10-08 DIAGNOSIS — I130 Hypertensive heart and chronic kidney disease with heart failure and stage 1 through stage 4 chronic kidney disease, or unspecified chronic kidney disease: Secondary | ICD-10-CM | POA: Diagnosis present

## 2016-10-08 DIAGNOSIS — Z9841 Cataract extraction status, right eye: Secondary | ICD-10-CM

## 2016-10-08 DIAGNOSIS — F5105 Insomnia due to other mental disorder: Secondary | ICD-10-CM | POA: Diagnosis present

## 2016-10-08 DIAGNOSIS — I5022 Chronic systolic (congestive) heart failure: Secondary | ICD-10-CM | POA: Diagnosis present

## 2016-10-08 DIAGNOSIS — Z961 Presence of intraocular lens: Secondary | ICD-10-CM | POA: Diagnosis present

## 2016-10-08 DIAGNOSIS — Z95810 Presence of automatic (implantable) cardiac defibrillator: Secondary | ICD-10-CM

## 2016-10-08 DIAGNOSIS — F419 Anxiety disorder, unspecified: Secondary | ICD-10-CM | POA: Diagnosis present

## 2016-10-08 DIAGNOSIS — I255 Ischemic cardiomyopathy: Secondary | ICD-10-CM | POA: Diagnosis present

## 2016-10-08 DIAGNOSIS — J189 Pneumonia, unspecified organism: Secondary | ICD-10-CM | POA: Diagnosis present

## 2016-10-08 DIAGNOSIS — K219 Gastro-esophageal reflux disease without esophagitis: Secondary | ICD-10-CM | POA: Diagnosis present

## 2016-10-08 DIAGNOSIS — J449 Chronic obstructive pulmonary disease, unspecified: Secondary | ICD-10-CM | POA: Diagnosis not present

## 2016-10-08 DIAGNOSIS — E875 Hyperkalemia: Secondary | ICD-10-CM | POA: Diagnosis not present

## 2016-10-08 DIAGNOSIS — Z8371 Family history of colonic polyps: Secondary | ICD-10-CM

## 2016-10-08 DIAGNOSIS — I509 Heart failure, unspecified: Secondary | ICD-10-CM | POA: Diagnosis not present

## 2016-10-08 DIAGNOSIS — I4891 Unspecified atrial fibrillation: Secondary | ICD-10-CM | POA: Diagnosis present

## 2016-10-08 DIAGNOSIS — Z955 Presence of coronary angioplasty implant and graft: Secondary | ICD-10-CM

## 2016-10-08 DIAGNOSIS — I480 Paroxysmal atrial fibrillation: Secondary | ICD-10-CM | POA: Diagnosis present

## 2016-10-08 DIAGNOSIS — J180 Bronchopneumonia, unspecified organism: Secondary | ICD-10-CM | POA: Diagnosis present

## 2016-10-08 DIAGNOSIS — R0602 Shortness of breath: Secondary | ICD-10-CM | POA: Diagnosis not present

## 2016-10-08 DIAGNOSIS — R3129 Other microscopic hematuria: Secondary | ICD-10-CM | POA: Diagnosis not present

## 2016-10-08 DIAGNOSIS — Z9842 Cataract extraction status, left eye: Secondary | ICD-10-CM

## 2016-10-08 DIAGNOSIS — J188 Other pneumonia, unspecified organism: Secondary | ICD-10-CM | POA: Diagnosis not present

## 2016-10-08 DIAGNOSIS — Z801 Family history of malignant neoplasm of trachea, bronchus and lung: Secondary | ICD-10-CM

## 2016-10-08 DIAGNOSIS — Z800 Family history of malignant neoplasm of digestive organs: Secondary | ICD-10-CM

## 2016-10-08 DIAGNOSIS — L8990 Pressure ulcer of unspecified site, unspecified stage: Secondary | ICD-10-CM | POA: Diagnosis present

## 2016-10-08 DIAGNOSIS — Z8679 Personal history of other diseases of the circulatory system: Secondary | ICD-10-CM

## 2016-10-08 LAB — CBC
HEMATOCRIT: 43.4 % (ref 39.0–52.0)
HEMOGLOBIN: 14.8 g/dL (ref 13.0–17.0)
MCH: 30.9 pg (ref 26.0–34.0)
MCHC: 34.1 g/dL (ref 30.0–36.0)
MCV: 90.6 fL (ref 78.0–100.0)
Platelets: 163 10*3/uL (ref 150–400)
RBC: 4.79 MIL/uL (ref 4.22–5.81)
RDW: 14.7 % (ref 11.5–15.5)
WBC: 7.6 10*3/uL (ref 4.0–10.5)

## 2016-10-08 LAB — I-STAT TROPONIN, ED: Troponin i, poc: 0.07 ng/mL (ref 0.00–0.08)

## 2016-10-08 LAB — BASIC METABOLIC PANEL
ANION GAP: 10 (ref 5–15)
BUN: 51 mg/dL — ABNORMAL HIGH (ref 6–20)
CO2: 31 mmol/L (ref 22–32)
Calcium: 9.3 mg/dL (ref 8.9–10.3)
Chloride: 93 mmol/L — ABNORMAL LOW (ref 101–111)
Creatinine, Ser: 1.63 mg/dL — ABNORMAL HIGH (ref 0.61–1.24)
GFR calc Af Amer: 44 mL/min — ABNORMAL LOW (ref >60)
GFR calc non Af Amer: 38 mL/min — ABNORMAL LOW (ref >60)
GLUCOSE: 159 mg/dL — AB (ref 65–99)
POTASSIUM: 3.1 mmol/L — AB (ref 3.5–5.1)
Sodium: 134 mmol/L — ABNORMAL LOW (ref 135–145)

## 2016-10-08 LAB — PROTIME-INR
INR: 1.53
Prothrombin Time: 18.5 seconds — ABNORMAL HIGH (ref 11.4–15.2)

## 2016-10-08 LAB — I-STAT CG4 LACTIC ACID, ED: LACTIC ACID, VENOUS: 1.38 mmol/L (ref 0.5–1.9)

## 2016-10-08 MED ORDER — POTASSIUM CHLORIDE 10 MEQ/100ML IV SOLN
10.0000 meq | INTRAVENOUS | Status: AC
  Administered 2016-10-08 (×2): 10 meq via INTRAVENOUS
  Filled 2016-10-08 (×2): qty 100

## 2016-10-08 MED ORDER — FLUTICASONE PROPIONATE 50 MCG/ACT NA SUSP: 1.0000 | Freq: Every day | NASAL | Status: DC | PRN

## 2016-10-08 MED ORDER — UMECLIDINIUM-VILANTEROL 62.5-25 MCG/INH IN AEPB
1.0000 | INHALATION_SPRAY | Freq: Every day | RESPIRATORY_TRACT | Status: DC
  Administered 2016-10-09 – 2016-10-12 (×3): 1 via RESPIRATORY_TRACT
  Filled 2016-10-08 (×2): qty 14

## 2016-10-08 MED ORDER — ATORVASTATIN CALCIUM 20 MG PO TABS
20.0000 mg | ORAL_TABLET | Freq: Every day | ORAL | Status: DC
Start: 2016-10-09 — End: 2016-10-12
  Administered 2016-10-09 – 2016-10-11 (×3): 20 mg via ORAL
  Filled 2016-10-08 (×3): qty 1

## 2016-10-08 MED ORDER — DOXYLAMINE SUCCINATE (SLEEP) 25 MG PO TABS
25.0000 mg | ORAL_TABLET | Freq: Every evening | ORAL | Status: DC | PRN
  Filled 2016-10-08: qty 1

## 2016-10-08 MED ORDER — POTASSIUM CHLORIDE CRYS ER 20 MEQ PO TBCR
40.0000 meq | EXTENDED_RELEASE_TABLET | Freq: Two times a day (BID) | ORAL | Status: DC
  Administered 2016-10-08 – 2016-10-09 (×3): 40 meq via ORAL
  Filled 2016-10-08 (×4): qty 2

## 2016-10-08 MED ORDER — DIGOXIN 125 MCG PO TABS
0.0625 mg | ORAL_TABLET | Freq: Every day | ORAL | Status: DC
  Administered 2016-10-10 – 2016-10-12 (×3): 0.0625 mg via ORAL
  Filled 2016-10-08 (×4): qty 1

## 2016-10-08 MED ORDER — SENNA 8.6 MG PO TABS
1.0000 | ORAL_TABLET | Freq: Every day | ORAL | Status: DC | PRN
  Filled 2016-10-08: qty 1

## 2016-10-08 MED ORDER — AMIODARONE HCL 200 MG PO TABS
200.0000 mg | ORAL_TABLET | Freq: Every day | ORAL | Status: DC
  Administered 2016-10-09 – 2016-10-12 (×4): 200 mg via ORAL
  Filled 2016-10-08 (×4): qty 1

## 2016-10-08 MED ORDER — MILRINONE LACTATE IN DEXTROSE 20-5 MG/100ML-% IV SOLN
0.3750 ug/kg/min | INTRAVENOUS | Status: DC
  Administered 2016-10-08 – 2016-10-09 (×2): 0.375 ug/kg/min via INTRAVENOUS
  Filled 2016-10-08 (×2): qty 100

## 2016-10-08 MED ORDER — ADULT MULTIVITAMIN W/MINERALS CH
1.0000 | ORAL_TABLET | Freq: Every day | ORAL | Status: DC
  Administered 2016-10-09 – 2016-10-12 (×4): 1 via ORAL
  Filled 2016-10-08 (×4): qty 1

## 2016-10-08 MED ORDER — VANCOMYCIN HCL IN DEXTROSE 1-5 GM/200ML-% IV SOLN
1000.0000 mg | Freq: Once | INTRAVENOUS | Status: AC
  Administered 2016-10-09: 1000 mg via INTRAVENOUS
  Filled 2016-10-08: qty 200

## 2016-10-08 MED ORDER — SUPER B COMPLEX PO TABS: 1.0000 | ORAL_TABLET | Freq: Every day | ORAL | Status: DC

## 2016-10-08 MED ORDER — CALCIUM CARBONATE 1250 (500 CA) MG PO TABS
1.0000 | ORAL_TABLET | Freq: Every day | ORAL | Status: DC
  Administered 2016-10-09 – 2016-10-12 (×4): 500 mg via ORAL
  Filled 2016-10-08 (×5): qty 1

## 2016-10-08 MED ORDER — METHYLPREDNISOLONE SODIUM SUCC 125 MG IJ SOLR
60.0000 mg | Freq: Two times a day (BID) | INTRAMUSCULAR | Status: DC
  Administered 2016-10-08: 60 mg via INTRAVENOUS
  Filled 2016-10-08: qty 2

## 2016-10-08 MED ORDER — MAGNESIUM OXIDE 400 (241.3 MG) MG PO TABS
400.0000 mg | ORAL_TABLET | Freq: Every day | ORAL | Status: DC
  Administered 2016-10-09 – 2016-10-12 (×4): 400 mg via ORAL
  Filled 2016-10-08 (×4): qty 1

## 2016-10-08 MED ORDER — APIXABAN 2.5 MG PO TABS
2.5000 mg | ORAL_TABLET | Freq: Two times a day (BID) | ORAL | Status: DC
  Administered 2016-10-08 – 2016-10-12 (×8): 2.5 mg via ORAL
  Filled 2016-10-08 (×9): qty 1

## 2016-10-08 MED ORDER — DIPHENHYDRAMINE HCL 25 MG PO CAPS
25.0000 mg | ORAL_CAPSULE | Freq: Every evening | ORAL | Status: DC | PRN
  Administered 2016-10-09 – 2016-10-10 (×2): 25 mg via ORAL
  Filled 2016-10-08 (×2): qty 1

## 2016-10-08 MED ORDER — ALPRAZOLAM 0.25 MG PO TABS
0.3750 mg | ORAL_TABLET | Freq: Every day | ORAL | Status: DC
  Administered 2016-10-08: 0.375 mg via ORAL
  Filled 2016-10-08: qty 2

## 2016-10-08 MED ORDER — SODIUM CHLORIDE 0.9% FLUSH
10.0000 mL | INTRAVENOUS | Status: DC | PRN
  Administered 2016-10-11 (×2): 10 mL
  Filled 2016-10-08 (×2): qty 40

## 2016-10-08 MED ORDER — VANCOMYCIN HCL IN DEXTROSE 750-5 MG/150ML-% IV SOLN
750.0000 mg | INTRAVENOUS | Status: DC
  Administered 2016-10-09 – 2016-10-10 (×2): 750 mg via INTRAVENOUS
  Filled 2016-10-08 (×2): qty 150

## 2016-10-08 MED ORDER — VITAMIN C 500 MG PO TABS
500.0000 mg | ORAL_TABLET | Freq: Every day | ORAL | Status: DC
  Administered 2016-10-09 – 2016-10-12 (×4): 500 mg via ORAL
  Filled 2016-10-08 (×3): qty 1

## 2016-10-08 MED ORDER — LORATADINE 10 MG PO TABS
10.0000 mg | ORAL_TABLET | Freq: Every day | ORAL | Status: DC
  Administered 2016-10-09 – 2016-10-12 (×4): 10 mg via ORAL
  Filled 2016-10-08 (×4): qty 1

## 2016-10-08 MED ORDER — LEVOTHYROXINE SODIUM 112 MCG PO TABS
112.0000 ug | ORAL_TABLET | ORAL | Status: DC
  Administered 2016-10-09 – 2016-10-12 (×4): 112 ug via ORAL
  Filled 2016-10-08 (×5): qty 1

## 2016-10-08 MED ORDER — PANTOPRAZOLE SODIUM 40 MG PO TBEC
40.0000 mg | DELAYED_RELEASE_TABLET | Freq: Every day | ORAL | Status: DC
  Administered 2016-10-09 – 2016-10-12 (×4): 40 mg via ORAL
  Filled 2016-10-08 (×4): qty 1

## 2016-10-08 MED ORDER — IPRATROPIUM-ALBUTEROL 0.5-2.5 (3) MG/3ML IN SOLN: 3.0000 mL | RESPIRATORY_TRACT | Status: DC | PRN

## 2016-10-08 MED ORDER — MAGNESIUM GLUCONATE 500 MG PO TABS: 500.0000 mg | ORAL_TABLET | Freq: Every day | ORAL | Status: DC

## 2016-10-08 MED ORDER — SODIUM CHLORIDE 0.9% FLUSH: 3.0000 mL | INTRAVENOUS | Status: DC | PRN

## 2016-10-08 MED ORDER — OMEGA-3-ACID ETHYL ESTERS 1 G PO CAPS
1.0000 g | ORAL_CAPSULE | Freq: Every day | ORAL | Status: DC
  Administered 2016-10-09 – 2016-10-12 (×4): 1 g via ORAL
  Filled 2016-10-08 (×4): qty 1

## 2016-10-08 MED ORDER — FUROSEMIDE 80 MG PO TABS
80.0000 mg | ORAL_TABLET | Freq: Two times a day (BID) | ORAL | Status: DC
  Administered 2016-10-08 – 2016-10-09 (×3): 80 mg via ORAL
  Filled 2016-10-08 (×3): qty 1

## 2016-10-08 MED ORDER — SODIUM CHLORIDE 0.9% FLUSH
3.0000 mL | Freq: Two times a day (BID) | INTRAVENOUS | Status: DC
  Administered 2016-10-08 – 2016-10-12 (×7): 3 mL via INTRAVENOUS

## 2016-10-08 MED ORDER — PREDNISONE 50 MG PO TABS
50.0000 mg | ORAL_TABLET | Freq: Every day | ORAL | Status: DC
  Administered 2016-10-09 – 2016-10-10 (×2): 50 mg via ORAL
  Filled 2016-10-08 (×2): qty 1

## 2016-10-08 MED ORDER — DEXTROSE 5 % IV SOLN
2.0000 g | INTRAVENOUS | Status: DC
  Administered 2016-10-08 – 2016-10-09 (×2): 2 g via INTRAVENOUS
  Filled 2016-10-08 (×2): qty 2

## 2016-10-08 MED ORDER — SODIUM CHLORIDE 0.9 % IV SOLN: 250.0000 mL | INTRAVENOUS | Status: DC | PRN

## 2016-10-08 MED ORDER — MILRINONE LACTATE IN DEXTROSE 20-5 MG/100ML-% IV SOLN: 0.2678 ug/kg/min | INTRAVENOUS | Status: DC

## 2016-10-08 NOTE — H&P (Signed)
History and Physical    Trevor Simpson YHC:[id number] DOB: 12-16-1933 DOA: 10/08/2016  PCP: [name], MD   Patient coming from: Home   Chief Complaint: SOB, productive cough, malaise, lethargy   HPI: Trevor Simpson is an 83 y.o. 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 Patient reports that he had been experiencing a productive cough with increased dyspnea approximately 2 weeks ago, saw his pulmonologist, was treated with doxycycline and prednisone, enjoyed some brief improvement, but has unfortunately worsened again. He denies fevers or chills, but reports a cough productive of thick blood-tinged sputum, as well as dyspnea at that has left him essentially bedbound for the last few days. Chronic lower extremity swelling is unchanged. Denies chest pain or palpitations.  ED Course: Upon arrival to the ED, patient is found to be afebrile, saturating adequately on room air, tachypneic, but with vitals otherwise stable. EKG features a ventricular paced rhythm and chest x-ray is notable for a left lower lobe bronchopneumonia superimposed on emphysema, as well as stable marked cardiomegaly without overt edema. Chemistry panels notable for a sodium of 134, potassium 3.1, BUN 51, and serum creatinine 1.63 which appears consistent with his baseline. CBC is unremarkable and troponin is within the normal limits. Blood culture was obtained in the emergency department and the hospitalists were asked to admit. Patient remained hemodynamically stable in the ED, tachypneic, but not in acute distress, and will be admitted to the telemetry unit for ongoing evaluation and management of suspected pneumonia.  Review of Systems:    All other systems reviewed and apart from HPI, are negative.  Past Medical History:  Diagnosis Date  . AAA (abdominal aortic aneurysm) ([place])    repaired  . Anxiety   . Arthritis    "left elbow and shoulder" (09/24/2013)  . Atrial fibrillation ([place])   . Automatic implantable cardioverter-defibrillator in situ   . Carotid artery occlusion   . Colon polyp   . Congestive heart failure ([place])   . Coronary artery disease    a. LHC (08/2012): Lmain: long, 20% stenosis distally LAD: mod calcified and mild dz prox. diff dz proximally 30%, first diagonal mild dz prox and small, Ramus intermediate brach lg bifurcates mid vessel prox. 90-95% stenosis LCx: relatively sml, rise to single marginal and contiine AV groove, 1st marg. diff 60-70% dz stenosis prox RCA: occ. prox. R-L coll, successful stent remus interm branch BMS  . GERD (gastroesophageal reflux disease)   . H/O blood clots   . HCAP (healthcare-associated pneumonia) 2014  . Hyperlipidemia   . Hypertension   . Hypothyroidism   . Ischemic cardiomyopathy   . Myocardial infarction ([place])   . Other and unspecified hyperlipidemia   . Pneumonia   . PVD (peripheral vascular disease) ([place])    s/p renal artery stent 20-04  . Sinoatrial node dysfunction ([facility])   . Stroke [facility]) 2014   left sided affected no residual effects  . Systolic congestive heart failure ([place])    a. ICM b.     Past Surgical History:  Procedure Laterality Date  . ABDOMINAL AORTIC ANEURYSM REPAIR  05/17/1993  . BI-VENTRICULAR IMPLANTABLE CARDIOVERTER DEFIBRILLATOR N/A 09/24/2013   Procedure: BI-VENTRICULAR IMPLANTABLE CARDIOVERTER DEFIBRILLATOR  (CRT-D);  Surgeon: Evans Lance, MD;  Location: [facility] CATH LAB;  Service:  Cardiovascular;  Laterality: N/A;  . BI-VENTRICULAR IMPLANTABLE CARDIOVERTER DEFIBRILLATOR  (CRT-D)  09/24/2013  . CARDIAC CATHETERIZATION N/A 02/17/2016   Procedure: Right Heart Cath;  Surgeon: Jolaine Artist, MD;  Location: [place] CV LAB;  Service:  Cardiovascular;  Laterality: N/A;  . CARDIAC PACEMAKER PLACEMENT  08/01/2009   st jude dual chamber; explanted 09/24/2013  . CARDIOVERSION N/A 09/23/2012   Procedure: CARDIOVERSION;  Surgeon: Lelon Perla, MD;  Location: [facility] ENDOSCOPY;  Service: Cardiovascular;  Laterality: N/A;  . CAROTID ENDARTERECTOMY Right 11-25-12  . CATARACT EXTRACTION W/ INTRAOCULAR LENS  IMPLANT, BILATERAL Bilateral   . CENTRAL VENOUS CATHETER INSERTION  02/17/2016   Procedure: Insertion Central Line Adult;  Surgeon: Jolaine Artist, MD;  Location: [place] CV LAB;  Service: Cardiovascular;;  . COLONOSCOPY    . CORONARY ANGIOPLASTY WITH STENT PLACEMENT  08/2012   "1"  . CORONARY ARTERY BYPASS GRAFT  05/17/1993   "CABG X4"  . Defribrillator    . ENDARTERECTOMY Right 11/25/2012   Procedure: ENDARTERECTOMY CAROTID With Dacron Patch Angioplasty;  Surgeon: Elam Dutch, MD;  Location: [place];  Service: Vascular;  Laterality: Right;  . ESOPHAGOGASTRODUODENOSCOPY    . INGUINAL HERNIA REPAIR Bilateral    "one at a time"  . IR GENERIC HISTORICAL  02/20/2016   IR US GUIDE VASC ACCESS RIGHT 02/20/2016 Arne Cleveland, MD [facility]-INTER[facility] RAD  . IR GENERIC HISTORICAL  02/20/2016   IR FLUORO GUIDE CV LINE RIGHT 02/20/2016 Arne Cleveland, MD [facility]  . LEFT AND RIGHT HEART CATHETERIZATION WITH CORONARY ANGIOGRAM N/A 09/15/2012   Procedure: LEFT AND RIGHT HEART CATHETERIZATION WITH CORONARY ANGIOGRAM;  Surgeon: Peter M Martinique, MD;  Location: [facility] CATH LAB;  Service: Cardiovascular;  Laterality: N/A;  . PERCUTANEOUS CORONARY STENT INTERVENTION (PCI-S)  09/15/2012   Procedure: PERCUTANEOUS CORONARY STENT INTERVENTION (PCI-S);  Surgeon: Peter M Martinique, MD;  Location: [facility] CATH LAB;  Service: Cardiovascular;;  . RENAL ARTERY STENT    . RIGHT HEART CATHETERIZATION N/A 08/31/2013   Procedure: RIGHT HEART CATH;  Surgeon: Jolaine Artist, MD;  Location: [facility] CATH LAB;  Service: Cardiovascular;  Laterality: N/A;  . TEE WITHOUT CARDIOVERSION N/A  09/23/2012   Procedure: TRANSESOPHAGEAL ECHOCARDIOGRAM (TEE);  Surgeon: Lelon Perla, MD;  Location: [facility] ENDOSCOPY;  Service: Cardiovascular;  Laterality: N/A;  . TRANSLUMINAL ANGIOPLASTY       reports that he quit smoking about 23 years ago. His smoking use included Cigarettes. He has a 50.00 pack-year smoking history. His smokeless tobacco use includes Chew. He reports that he does not drink alcohol or use drugs.  Allergies  Allergen Reactions  . Codeine     "tripped out on it"  . Morphine     "tripped out on it"    Family History  Problem Relation Age of Onset  . CAD Father   . Hypertension Father   . Heart disease Father   . CAD Brother   . Heart disease Brother   . Heart attack Brother   . Diabetes Brother   . Hypertension Mother   . Hypertension Daughter   . Vascular Disease Daughter        Right Carotid  . Colon cancer Son   . Lung cancer Sister   . Heart disease Sister   . Hypertension Sister   . Colon polyps Brother        x2     Prior to Admission medications   Medication Sig Start Date End Date Taking? Authorizing Provider  ALPRAZolam (  XANAX) 0.25 MG tablet Take 0.375 mg by mouth at bedtime.    [provider]  amiodarone (PACERONE) 200 MG tablet Take 1 tablet (200 mg total) by mouth daily. 05/28/16   Bensimhon, Shaune Pascal, MD  apixaban (ELIQUIS) 2.5 MG TABS tablet Take 1 tablet (2.5 mg total) by mouth 2 (two) times daily. 01/19/16   Bensimhon, Shaune Pascal, MD  atorvastatin (LIPITOR) 20 MG tablet Take 20 mg by mouth daily at 6 PM.     [provider]  B Complex-C (SUPER B COMPLEX) TABS Take 1 tablet by mouth daily.     [provider]  calcium carbonate (OS-CAL) 600 MG TABS Take 600 mg by mouth daily with breakfast.     [provider]  cetirizine (ZYRTEC) 10 MG tablet Take 10 mg by mouth daily.  08/24/10   [provider]  digoxin (LANOXIN) 0.125 MG tablet Take 0.5 tablets (0.0625 mg total) by mouth daily. 02/06/16    Larey Dresser, MD  doxycycline (VIBRA-TABS) 100 MG tablet Take 1 tablet (100 mg total) by mouth 2 (two) times daily. 10/01/16   Mannam, Hart Robinsons, MD  doxylamine, Sleep, (UNISOM) 25 MG tablet Take 25 mg by mouth at bedtime.    [provider]  fluticasone (FLONASE) 50 MCG/ACT nasal spray Place 1 spray into both nostrils daily as needed for allergies. Reported on 04/11/2015 09/23/14   [provider]  furosemide (LASIX) 80 MG tablet Take 80 mg by mouth. 80 mg in the evening and 40 mg at bedtime    [provider]  levothyroxine (SYNTHROID, LEVOTHROID) 112 MCG tablet Take 112 mcg by mouth See admin instructions. Pt takes Monday- Saturday - skips on Sundays    [provider]  magnesium gluconate (MAGONATE) 500 MG tablet Take 500 mg by mouth daily.     [provider]  milrinone (PRIMACOR) 20 MG/100 ML SOLN infusion Inject 26.775 mcg/min into the vein continuous. 02/21/16   Cristal Ford, DO  Multiple Vitamin (MULTIVITAMIN WITH MINERALS) TABS Take 1 tablet by mouth daily.    [provider]  nitroGLYCERIN (NITROSTAT) 0.4 MG SL tablet Place 1 tablet (0.4 mg total) under the tongue every 5 (five) minutes as needed for chest pain. 09/14/15   Evans Lance, MD  Omega-3 Fatty Acids (FISH OIL) 1200 MG CAPS Take 1,200 mg by mouth daily.     [provider]  omeprazole (PRILOSEC) 20 MG capsule Take 20 mg by mouth daily as needed (indigestion).     [provider]  potassium chloride SA (KLOR-CON M20) 20 MEQ tablet Take 2 tablets (40 mEq total) by mouth 2 (two) times daily. 07/31/16   Bensimhon, Shaune Pascal, MD  predniSONE (DELTASONE) 10 MG tablet 4 tabs x 2 days, 3 tabs x 2 days, 2 tabs x 2 days, 1 tab x 2 days then stop 10/01/16   Mannam, Praveen, MD  Saw Palmetto, Serenoa repens, (SAW PALMETTO PO) Take 1 capsule by mouth 2 (two) times daily.    [provider]  senna (SENOKOT) 8.6 MG TABS tablet Take 1 tablet by mouth daily as needed  for mild constipation.    [provider]  umeclidinium-vilanterol (ANORO ELLIPTA) 62.5-25 MCG/INH AEPB Inhale 1 puff into the lungs daily. 09/18/16   Mannam, Hart Robinsons, MD  vitamin C (ASCORBIC ACID) 500 MG tablet Take 500 mg by mouth daily.      [provider]    Physical Exam: Vitals:   10/08/16 1845 10/08/16 1900 10/08/16 1930  10/08/16 1945  BP: 118/71 115/65 110/70 106/63  Pulse:   74 70  Resp: 18 16 18 18   Temp:      TempSrc:      SpO2:   96% 98%      Constitutional: No acute distress, calm, cachectic, dyspneic with speech  Eyes: PERTLA, lids and conjunctivae normal ENMT: Mucous membranes are moist. Posterior pharynx clear of any exudate or lesions.   Neck: normal, supple, no masses, no thyromegaly Respiratory: tachypneic, dyspneic with speech, coarse rhonchi at left base, no wheezing, no crackles. Normal respiratory effort.  Cardiovascular: S1 & S2 heard, regular rate and rhythm, grade 3 systolic murmurs at RUSB and apex. Bilateral pedal edema. Abdomen: No distension, no tenderness, no masses palpated. Bowel sounds normal.  Musculoskeletal: no clubbing / cyanosis. No joint deformity upper and lower extremities.   Skin: Mottled distal LE's bilaterally. Sores on heels, sacrum. Not pale or jaundiced. Neurologic: CN 2-12 grossly intact. Sensation intact, DTR normal. Strength 5/5 in all 4 limbs.  Psychiatric: Alert and oriented x 3. Pleasant and cooperative.     Labs on Admission: I have personally reviewed following labs and imaging studies  CBC:  Recent Labs Lab 10/08/16 1700  WBC 7.6  HGB 14.8  HCT 43.4  MCV 90.6  PLT 096   Basic Metabolic Panel:  Recent Labs Lab 10/08/16 1700  NA 134*  K 3.1*  CL 93*  CO2 31  GLUCOSE 159*  BUN 51*  CREATININE 1.63*  CALCIUM 9.3   GFR: CrCl cannot be calculated (Unknown ideal weight.). Liver Function Tests: No results for input(s): AST, ALT, ALKPHOS, BILITOT, PROT, ALBUMIN in the last 168 hours. No  results for input(s): LIPASE, AMYLASE in the last 168 hours. No results for input(s): AMMONIA in the last 168 hours. Coagulation Profile: No results for input(s): INR, PROTIME in the last 168 hours. Cardiac Enzymes: No results for input(s): CKTOTAL, CKMB, CKMBINDEX, TROPONINI in the last 168 hours. BNP (last 3 results) No results for input(s): PROBNP in the last 8760 hours. HbA1C: No results for input(s): HGBA1C in the last 72 hours. CBG: No results for input(s): GLUCAP in the last 168 hours. Lipid Profile: No results for input(s): CHOL, HDL, LDLCALC, TRIG, CHOLHDL, LDLDIRECT in the last 72 hours. Thyroid Function Tests: No results for input(s): TSH, T4TOTAL, FREET4, T3FREE, THYROIDAB in the last 72 hours. Anemia Panel: No results for input(s): VITAMINB12, FOLATE, FERRITIN, TIBC, IRON, RETICCTPCT in the last 72 hours. Urine analysis:    Component Value Date/Time   COLORURINE YELLOW 11/20/2012 1123   APPEARANCEUR CLEAR 11/20/2012 1123   LABSPEC 1.004 (L) 11/20/2012 1123   PHURINE 7.0 11/20/2012 1123   GLUCOSEU NEGATIVE 11/20/2012 1123   HGBUR NEGATIVE 11/20/2012 1123   BILIRUBINUR NEGATIVE 11/20/2012 1123   KETONESUR NEGATIVE 11/20/2012 1123   PROTEINUR NEGATIVE 11/20/2012 1123   UROBILINOGEN 0.2 11/20/2012 1123   NITRITE NEGATIVE 11/20/2012 1123   LEUKOCYTESUR NEGATIVE 11/20/2012 1123   Sepsis Labs: @LABRCNTIP (procalcitonin:4,lacticidven:4) )No results found for this or any previous visit (from the past 240 hour(s)).   Radiological Exams on Admission: Dg Chest 2 View  Result Date: 10/08/2016 CLINICAL DATA:  83 year old with 2-3 week history of intermittent cough and shortness of breath. Patient recently completed a course of antibiotic therapy. Current history of abdominal aortic aneurysm, atrial fibrillation, CHF and coronary artery disease. Indwelling biventricular pacing defibrillator. EXAM: CHEST  2 VIEW COMPARISON:  09/18/2016, 05/22/2016 and earlier. FINDINGS: Cardiac  silhouette markedly enlarged, unchanged. Thoracic aorta atherosclerotic, unchanged. Hilar and  mediastinal contours otherwise unremarkable. Left subclavian biventricular pacing defibrillator unchanged. Right jugular central venous catheter tip in the upper right atrium, unchanged. Stable hyperinflation and mild central peribronchial thickening. Interval development of streaky and patchy airspace opacities in the left lower lobe. Stable linear scarring in the right middle lobe. Mild pulmonary venous hypertension without overt edema. Stable small bilateral pleural effusions since the examination 3 weeks ago. IMPRESSION: 1. Left lower lobe bronchopneumonia superimposed upon COPD/emphysema. 2. Stable marked cardiomegaly. Pulmonary venous hypertension without overt pulmonary edema. 3. Stable small bilateral pleural effusions since the examination 3 weeks ago. 4.  Aortic Atherosclerosis (ICD10-170.0) 5.  Emphysema. ([id number]) Electronically Signed   By: Evangeline Dakin M.D.   On: 10/08/2016 18:45    EKG: Independently reviewed. Ventricular-paced rhythm.  Assessment/Plan  1. HCAP  - Pt presents with productive cough, dyspnea - Found to have LLL consolidation on CXR  - Had recently improved briefly with doxycycline and prednisone before re-worsening over the past several days  - Given his home infusion therapy, will treat with vancomycin and cefepime pending culture data - Blood cultures collected in ED, sputum culture and gram stain requested, check strep pneumo antigen   2. Chronic combined CHF - Appears stable on admission - TTE (01/19/16) with EF 25%, ungraded diastolic dysfunction, diffuse HK, moderate MR, moderate LAE, at least mild AS  - Dependent on milrinone infusion chronically, will continue  - Continue lasix 40 qAM and 80 qPM - Follow I/O's and daily wts   3. Chronic atrial fibrillation   - CHADS-VASc is 45 (age x2, CHF, CAD)  - Continue Eliquis, amiodarone   4. CKD stage III  - SCr is  1.63 on admission, consistent with his apparent baseline  - Continue milrinone infusion    5. Emphysema with exacerbation - Occasional wheezes on admission, likely precipitated by infection - Continue inhalers, prn nebs  - Treat with systemic steroid, taper as tolerated  - Sputum culture and abx as above   6. Hypokalemia - Serum potassium is 3.1 on admission  - Continue 40 mEq oral supplement BID as at home, also give 20 mEq IV, continue mag  - Continue telemetry monitoring   7. GERD - Had mild duodenitis on remote EGD  - Continue daily PPI     DVT prophylaxis: Eliquis Code Status: Full  Family Communication: Son, daughter, and wife updated at bedside Disposition Plan: Admit to telemetry unit  Consults called: None Admission status: Inpatient   Vianne Bulls, MD [facility] Pager [phone]  If 7PM-7AM, please contact night-coverage [email or web address] Password [facility]  10/08/2016, 8:03 PM

## 2016-10-08 NOTE — Progress Notes (Signed)
[facility]  Active pt with [facility] services for home Milrinone. [facility] Hospital team will follow Mr. Lehnen while an inpatient to support transition home when ordered.  If patient discharges after hours, please call [phone].   Larry Sierras 10/08/2016, 10:15 PM

## 2016-10-08 NOTE — ED Triage Notes (Signed)
Pt presents with c/o shortness of breath. The shortness of breath has become worse over the past week. He has also felt more fatigued and begun to cough blood. He denies any fevers, chills, pain.  He was started on doxycycline and prednisone by pulmonology seven days ago for a lung infection, which he has been taking as prescribed

## 2016-10-08 NOTE — ED Provider Notes (Addendum)
[facility] DEPT Provider Note   CSN: [id number] Arrival date & time: 10/08/16  1637     History   Chief Complaint Chief Complaint  Patient presents with  . Shortness of Breath    HPI Trevor Simpson is a 83 y.o. male.  HPI Patient presents with shortness of breath. Has been worse over the last week. Around 2 weeks ago was seen by pulmonology. Later started on doxycycline and steroids. Had gotten better and then worsened again. Now started to have hemoptysis again. Feels a little weak. No fevers chills. Just finished up the steroids. Also has end-stage CHF.He has a chronic milrinone infusion. Most recent CHF clinic visit have discussed palliative care.   Past Medical History:  Diagnosis Date  . AAA (abdominal aortic aneurysm) ([place])    repaired  . Anxiety   . Arthritis    "left elbow and shoulder" (09/24/2013)  . Atrial fibrillation ([place])   . Automatic implantable cardioverter-defibrillator in situ   . Carotid artery occlusion   . Colon polyp   . Congestive heart failure ([place])   . Coronary artery disease    a. LHC (08/2012): Lmain: long, 20% stenosis distally LAD: mod calcified and mild dz prox. diff dz proximally 30%, first diagonal mild dz prox and small, Ramus intermediate brach lg bifurcates mid vessel prox. 90-95% stenosis LCx: relatively sml, rise to single marginal and contiine AV groove, 1st marg. diff 60-70% dz stenosis prox RCA: occ. prox. R-L coll, successful stent remus interm branch BMS  . GERD (gastroesophageal reflux disease)   . H/O blood clots   . HCAP (healthcare-associated pneumonia) 2014  . Hyperlipidemia   . Hypertension   . Hypothyroidism   . Ischemic cardiomyopathy   . Myocardial infarction ([place])   . Other and unspecified hyperlipidemia   . Pneumonia   . PVD (peripheral vascular disease) ([place])    s/p renal artery stent 20-04  . Sinoatrial node dysfunction (H[facility])   . Stroke [facility]) 2014   left sided affected no residual effects  . Systolic  congestive heart failure ([place])    a. ICM b.     Patient Active Problem List   Diagnosis Date Noted  . Chest pain 02/17/2016  . Arterial hypotension 10/11/2015  . NSTEMI (non-ST elevated myocardial infarction) ([place]) 10/17/2014  . Chronic kidney disease (CKD), stage III (moderate) 10/17/2014  . Epigastric pain 10/16/2014  . Post-nasal drainage 09/23/2014  . History of colonic polyps 03/03/2014  . Chronic anticoagulation 03/03/2014  . Family history of colon cancer 03/03/2014  . CN (constipation) 03/03/2014  . Lung nodule 02/16/2014  . Dyspnea 02/16/2014  . PMT (pacemaker-mediated tachycardia) 10/20/2013  . Shock, cardiogenic ([place]) 08/31/2013  . Pulmonary emphysema ([place]) 02/26/2013  . Cerebral infarction ([place]) 09/27/2012  . Anxiety 09/20/2012  . Shortness of breath 09/20/2012  . Intermediate coronary syndrome ([place]) 09/18/2012  . Cardiomyopathy, ischemic 09/14/2012  . Systolic congestive heart failure ([place])   . Cardiomyopathy ([place])   . Occlusion and stenosis of carotid artery 12/16/2009  . Biventricular ICD (implantable cardioverter-defibrillator) in place 11/22/2009  . Other and unspecified hyperlipidemia 06/30/2009  . Essential hypertension 06/30/2009  . Coronary atherosclerosis 06/30/2009  . ATRIAL FIBRILLATION, PAROXYSMAL 06/30/2009  . BRADYCARDIA-TACHYCARDIA SYNDROME 06/30/2009    Past Surgical History:  Procedure Laterality Date  . ABDOMINAL AORTIC ANEURYSM REPAIR  05/17/1993  . BI-VENTRICULAR IMPLANTABLE CARDIOVERTER DEFIBRILLATOR N/A 09/24/2013   Procedure: BI-VENTRICULAR IMPLANTABLE CARDIOVERTER DEFIBRILLATOR  (CRT-D);  Surgeon: Evans Lance, MD;  Location: [facility] CATH LAB;  Service: Cardiovascular;  Laterality: N/A;  . BI-VENTRICULAR IMPLANTABLE CARDIOVERTER DEFIBRILLATOR  (CRT-D)  09/24/2013  . CARDIAC CATHETERIZATION N/A 02/17/2016   Procedure: Right Heart Cath;  Surgeon: Jolaine Artist, MD;  Location: [facility] CV LAB;  Service: Cardiovascular;  Laterality: N/A;  .  CARDIAC PACEMAKER PLACEMENT  08/01/2009   st jude dual chamber; explanted 09/24/2013  . CARDIOVERSION N/A 09/23/2012   Procedure: CARDIOVERSION;  Surgeon: Lelon Perla, MD;  Location: [facility] ENDOSCOPY;  Service: Cardiovascular;  Laterality: N/A;  . CAROTID ENDARTERECTOMY Right 11-25-12  . CATARACT EXTRACTION W/ INTRAOCULAR LENS  IMPLANT, BILATERAL Bilateral   . CENTRAL VENOUS CATHETER INSERTION  02/17/2016   Procedure: Insertion Central Line Adult;  Surgeon: Jolaine Artist, MD;  Location: [facility] CV LAB;  Service: Cardiovascular;;  . COLONOSCOPY    . CORONARY ANGIOPLASTY WITH STENT PLACEMENT  08/2012   "1"  . CORONARY ARTERY BYPASS GRAFT  05/17/1993   "CABG X4"  . Defribrillator    . ENDARTERECTOMY Right 11/25/2012   Procedure: ENDARTERECTOMY CAROTID With Dacron Patch Angioplasty;  Surgeon: Elam Dutch, MD;  Location: [facility];  Service: Vascular;  Laterality: Right;  . ESOPHAGOGASTRODUODENOSCOPY    . INGUINAL HERNIA REPAIR Bilateral    "one at a time"  . IR GENERIC HISTORICAL  02/20/2016   IR US GUIDE VASC ACCESS RIGHT 02/20/2016 Arne Cleveland, MD [facility]INTER[facility] RAD  . IR GENERIC HISTORICAL  02/20/2016   IR FLUORO GUIDE CV LINE RIGHT 02/20/2016 Arne Cleveland, MD [facility]  . LEFT AND RIGHT HEART CATHETERIZATION WITH CORONARY ANGIOGRAM N/A 09/15/2012   Procedure: LEFT AND RIGHT HEART CATHETERIZATION WITH CORONARY ANGIOGRAM;  Surgeon: Peter M Martinique, MD;  Location: [facility] CATH LAB;  Service: Cardiovascular;  Laterality: N/A;  . PERCUTANEOUS CORONARY STENT INTERVENTION (PCI-S)  09/15/2012   Procedure: PERCUTANEOUS CORONARY STENT INTERVENTION (PCI-S);  Surgeon: Peter M Martinique, MD;  Location: [facility] CATH LAB;  Service: Cardiovascular;;  . RENAL ARTERY STENT    . RIGHT HEART CATHETERIZATION N/A 08/31/2013   Procedure: RIGHT HEART CATH;  Surgeon: Jolaine Artist, MD;  Location: [facility] CATH LAB;  Service: Cardiovascular;  Laterality: N/A;  . TEE WITHOUT CARDIOVERSION N/A 09/23/2012   Procedure: TRANSESOPHAGEAL  ECHOCARDIOGRAM (TEE);  Surgeon: Lelon Perla, MD;  Location: [facility] ENDOSCOPY;  Service: Cardiovascular;  Laterality: N/A;  . Abanda Medications    Prior to Admission medications   Medication Sig Start Date End Date Taking? Authorizing Provider  ALPRAZolam (XANAX) 0.25 MG tablet Take 0.375 mg by mouth at bedtime.    [provider]  amiodarone (PACERONE) 200 MG tablet Take 1 tablet (200 mg total) by mouth daily. 05/28/16   Bensimhon, Shaune Pascal, MD  apixaban (ELIQUIS) 2.5 MG TABS tablet Take 1 tablet (2.5 mg total) by mouth 2 (two) times daily. 01/19/16   Bensimhon, Shaune Pascal, MD  atorvastatin (LIPITOR) 20 MG tablet Take 20 mg by mouth daily at 6 PM.     [provider]  B Complex-C (SUPER B COMPLEX) TABS Take 1 tablet by mouth daily.     [provider]  calcium carbonate (OS-CAL) 600 MG TABS Take 600 mg by mouth daily with breakfast.     [provider]  cetirizine (ZYRTEC) 10 MG tablet Take 10 mg by mouth daily.  08/24/10   [provider]  digoxin (LANOXIN) 0.125 MG tablet Take 0.5 tablets (0.0625 mg total) by mouth daily. 02/06/16   Larey Dresser, MD  doxycycline (VIBRA-TABS)  100 MG tablet Take 1 tablet (100 mg total) by mouth 2 (two) times daily. 10/01/16   Mannam, Hart Robinsons, MD  doxylamine, Sleep, (UNISOM) 25 MG tablet Take 25 mg by mouth at bedtime.    [provider]  fluticasone (FLONASE) 50 MCG/ACT nasal spray Place 1 spray into both nostrils daily as needed for allergies. Reported on 04/11/2015 09/23/14   [provider]  furosemide (LASIX) 80 MG tablet Take 80 mg by mouth. 80 mg in the evening and 40 mg at bedtime    [provider]  levothyroxine (SYNTHROID, LEVOTHROID) 112 MCG tablet Take 112 mcg by mouth See admin instructions. Pt takes Monday- Saturday - skips on Sundays    [provider]  magnesium gluconate (MAGONATE) 500 MG tablet Take 500 mg by mouth daily.     [provider]  milrinone (PRIMACOR) 20 MG/100 ML SOLN infusion Inject 26.775 mcg/min into the vein continuous. 02/21/16   Cristal Ford, DO  Multiple Vitamin (MULTIVITAMIN WITH MINERALS) TABS Take 1 tablet by mouth daily.    [provider]  nitroGLYCERIN (NITROSTAT) 0.4 MG SL tablet Place 1 tablet (0.4 mg total) under the tongue every 5 (five) minutes as needed for chest pain. 09/14/15   Evans Lance, MD  Omega-3 Fatty Acids (FISH OIL) 1200 MG CAPS Take 1,200 mg by mouth daily.     [provider]  omeprazole (PRILOSEC) 20 MG capsule Take 20 mg by mouth daily as needed (indigestion).     [provider]  potassium chloride SA (KLOR-CON M20) 20 MEQ tablet Take 2 tablets (40 mEq total) by mouth 2 (two) times daily. 07/31/16   Bensimhon, Shaune Pascal, MD  predniSONE (DELTASONE) 10 MG tablet 4 tabs x 2 days, 3 tabs x 2 days, 2 tabs x 2 days, 1 tab x 2 days then stop 10/01/16   Mannam, Praveen, MD  Saw Palmetto, Serenoa repens, (SAW PALMETTO PO) Take 1 capsule by mouth 2 (two) times daily.    [provider]  senna (SENOKOT) 8.6 MG TABS tablet Take 1 tablet by mouth daily as needed for mild constipation.    [provider]  umeclidinium-vilanterol (ANORO ELLIPTA) 62.5-25 MCG/INH AEPB Inhale 1 puff into the lungs daily. 09/18/16   Mannam, Hart Robinsons, MD  vitamin C (ASCORBIC ACID) 500 MG tablet Take 500 mg by mouth daily.      [provider]    Family History Family History  Problem Relation Age of Onset  . CAD Father   . Hypertension Father   . Heart disease Father   . CAD Brother   . Heart disease Brother   . Heart attack Brother   . Diabetes Brother   . Hypertension Mother   . Hypertension Daughter   . Vascular Disease Daughter        Right Carotid  . Colon cancer Son   . Lung cancer Sister   . Heart disease Sister   . Hypertension Sister   . Colon polyps Brother        x2    Social History Social History  Substance Use Topics  .  Smoking status: Former Smoker    Packs/day: 1.00    Years: 50.00    Types: Cigarettes    Quit date: 05/17/1993  . Smokeless tobacco: Current User    Types: Chew  . Alcohol use No     Allergies   Codeine and Morphine   Review of Systems Review of Systems  Constitutional: Positive for  appetite change.  HENT: Negative for congestion.   Respiratory: Positive for shortness of breath.        Hemoptysis  Cardiovascular: Positive for leg swelling. Negative for chest pain.  Gastrointestinal: Negative for abdominal pain.  Genitourinary: Negative for flank pain.  Musculoskeletal: Negative for back pain.  Skin: Negative for rash.  Neurological: Negative for headaches.  Hematological: Negative for adenopathy.  Psychiatric/Behavioral: Negative for confusion.     Physical Exam Updated Vital Signs BP 115/65   Pulse 76   Temp 97.7 F (36.5 C) (Oral)   Resp 16   SpO2 100%   Physical Exam  Constitutional: He appears well-developed.  HENT:  Head: Atraumatic.  Eyes: Pupils are equal, round, and reactive to light.  Neck: No JVD present.  Cardiovascular: Normal rate.   Pulmonary/Chest: He has no wheezes.  Catheter right chest wall for milrinone infusion.  Abdominal: Soft.  Musculoskeletal: He exhibits edema.  Mild edema bilateral lower extremities.  Neurological: He is alert.  Skin: Skin is warm. Capillary refill takes less than 2 seconds.  Psychiatric: He has a normal mood and affect.     ED Treatments / Results  Labs (all labs ordered are listed, but only abnormal results are displayed) Labs Reviewed  BASIC METABOLIC PANEL - Abnormal; Notable for the following:       Result Value   Sodium 134 (*)    Potassium 3.1 (*)    Chloride 93 (*)    Glucose, Bld 159 (*)    BUN 51 (*)    Creatinine, Ser 1.63 (*)    GFR calc non Af Amer 38 (*)    GFR calc Af Amer 44 (*)    All other components within normal limits  CULTURE, BLOOD (ROUTINE X 2)  CULTURE, BLOOD (ROUTINE X 2)    CBC  PROTIME-INR  URINALYSIS, ROUTINE W REFLEX MICROSCOPIC  I-STAT TROPONIN, ED  I-STAT CG4 LACTIC ACID, ED    EKG  EKG Interpretation  Date/Time:  Monday October 08 2016 16:45:50 EDT Ventricular Rate:  148 PR Interval:    QRS Duration: 164 QT Interval:  238 QTC Calculation: 373 R Axis:   -92 Text Interpretation:  Ventricular-paced rhythm in a pattern of bigeminy Abnormal ECG Confirmed by Davonna Belling [phone]) on 10/08/2016 7:00:06 PM       Radiology Dg Chest 2 View  Result Date: 10/08/2016 CLINICAL DATA:  83 year old with 2-3 week history of intermittent cough and shortness of breath. Patient recently completed a course of antibiotic therapy. Current history of abdominal aortic aneurysm, atrial fibrillation, CHF and coronary artery disease. Indwelling biventricular pacing defibrillator. EXAM: CHEST  2 VIEW COMPARISON:  09/18/2016, 05/22/2016 and earlier. FINDINGS: Cardiac silhouette markedly enlarged, unchanged. Thoracic aorta atherosclerotic, unchanged. Hilar and mediastinal contours otherwise unremarkable. Left subclavian biventricular pacing defibrillator unchanged. Right jugular central venous catheter tip in the upper right atrium, unchanged. Stable hyperinflation and mild central peribronchial thickening. Interval development of streaky and patchy airspace opacities in the left lower lobe. Stable linear scarring in the right middle lobe. Mild pulmonary venous hypertension without overt edema. Stable small bilateral pleural effusions since the examination 3 weeks ago. IMPRESSION: 1. Left lower lobe bronchopneumonia superimposed upon COPD/emphysema. 2. Stable marked cardiomegaly. Pulmonary venous hypertension without overt pulmonary edema. 3. Stable small bilateral pleural effusions since the examination 3 weeks ago. 4.  Aortic Atherosclerosis (ICD10-170.0) 5.  Emphysema. ([id number]) Electronically Signed   By: Evangeline Dakin M.D.   On: 10/08/2016 18:45     Procedures Procedures (including critical  care time)  Medications Ordered in ED Medications - No data to display   Initial Impression / Assessment and Plan / ED Course  I have reviewed the triage vital signs and the nursing notes.  Pertinent labs & imaging results that were available during my care of the patient were reviewed by me and considered in my medical decision making (see chart for details).     Patient presents with shortness of breath. Has some hemoptysis. Likely pneumonia. Has been on antibiotics. And has been on steroids. Not hypoxic. Having recently been on antibiotics that think he would benefit from admission.  Final Clinical Impressions(s) / ED Diagnoses   Final diagnoses:  Community acquired pneumonia, unspecified laterality  End stage congestive heart failure [facility])    New Prescriptions New Prescriptions   No medications on file     Davonna Belling, MD 10/08/16 1931    Davonna Belling, MD 10/08/16 Lenny Pastel    Davonna Belling, MD 10/08/16 [phone]

## 2016-10-08 NOTE — Progress Notes (Signed)
Pharmacy Antibiotic Note Trevor Simpson is a 83 y.o. male admitted on 10/08/2016 with concern for PNA in setting of end stage CHF.  Pharmacy has been consulted for Cefepime and vancomycin dosing.  Plan: Vancomycin 750 IV every 24 hours.  Goal trough 15-20 mcg/mL.  Cefepime 2 gram IV every 24 hours.  Weight: 145 lb 8.1 oz (66 kg)  Temp (24hrs), Avg:97.7 F (36.5 C), Min:97.7 F (36.5 C), Max:97.7 F (36.5 C)   Recent Labs Lab 10/08/16 1700 10/08/16 1956  WBC 7.6  --   CREATININE 1.63*  --   LATICACIDVEN  --  1.38    Estimated Creatinine Clearance: 33.2 mL/min (A) (by C-G formula based on SCr of 1.63 mg/dL (H)).    Allergies  Allergen Reactions  . Codeine     "tripped out on it"  . Morphine     "tripped out on it"    Antimicrobials this admission: 7/23 Cefepime >>  7/23 Vancomycin >>   Microbiology results: 7/23 BCx: px  Thank you for allowing pharmacy to be a part of this patient's care.  Vincenza Hews, PharmD, BCPS 10/08/2016, 8:53 PM

## 2016-10-09 DIAGNOSIS — J189 Pneumonia, unspecified organism: Secondary | ICD-10-CM

## 2016-10-09 DIAGNOSIS — I5022 Chronic systolic (congestive) heart failure: Secondary | ICD-10-CM

## 2016-10-09 DIAGNOSIS — I2510 Atherosclerotic heart disease of native coronary artery without angina pectoris: Secondary | ICD-10-CM

## 2016-10-09 LAB — URINALYSIS, ROUTINE W REFLEX MICROSCOPIC
Bacteria, UA: NONE SEEN
Bilirubin Urine: NEGATIVE
GLUCOSE, UA: NEGATIVE mg/dL
Ketones, ur: NEGATIVE mg/dL
Leukocytes, UA: NEGATIVE
Nitrite: NEGATIVE
PH: 6 (ref 5.0–8.0)
Protein, ur: NEGATIVE mg/dL
Specific Gravity, Urine: 1.01 (ref 1.005–1.030)

## 2016-10-09 LAB — BASIC METABOLIC PANEL
ANION GAP: 8 (ref 5–15)
BUN: 52 mg/dL — ABNORMAL HIGH (ref 6–20)
CHLORIDE: 95 mmol/L — AB (ref 101–111)
CO2: 32 mmol/L (ref 22–32)
Calcium: 8.9 mg/dL (ref 8.9–10.3)
Creatinine, Ser: 1.5 mg/dL — ABNORMAL HIGH (ref 0.61–1.24)
GFR calc Af Amer: 49 mL/min — ABNORMAL LOW (ref >60)
GFR, EST NON AFRICAN AMERICAN: 42 mL/min — AB (ref >60)
GLUCOSE: 186 mg/dL — AB (ref 65–99)
POTASSIUM: 3.7 mmol/L (ref 3.5–5.1)
Sodium: 135 mmol/L (ref 135–145)

## 2016-10-09 LAB — HIV ANTIBODY (ROUTINE TESTING W REFLEX): HIV SCREEN 4TH GENERATION: NONREACTIVE

## 2016-10-09 LAB — COOXEMETRY PANEL
CARBOXYHEMOGLOBIN: 1.3 % (ref 0.5–1.5)
METHEMOGLOBIN: 1.1 % (ref 0.0–1.5)
O2 Saturation: 54.2 %
Total hemoglobin: 12.7 g/dL (ref 12.0–16.0)

## 2016-10-09 LAB — STREP PNEUMONIAE URINARY ANTIGEN: Strep Pneumo Urinary Antigen: NEGATIVE

## 2016-10-09 MED ORDER — MILRINONE LACTATE IN DEXTROSE 20-5 MG/100ML-% IV SOLN
0.3750 ug/kg/min | INTRAVENOUS | Status: DC
  Administered 2016-10-09 – 2016-10-12 (×6): 0.375 ug/kg/min via INTRAVENOUS
  Filled 2016-10-09 (×5): qty 100

## 2016-10-09 MED ORDER — ALPRAZOLAM 0.25 MG PO TABS
0.3750 mg | ORAL_TABLET | Freq: Three times a day (TID) | ORAL | Status: DC | PRN
  Administered 2016-10-09 – 2016-10-10 (×5): 0.375 mg via ORAL
  Filled 2016-10-09 (×5): qty 2

## 2016-10-09 NOTE — Progress Notes (Signed)
New Admission Note:   Arrival Method: From ED Mental Orientation: Alert and oriented x4  Telemetry:3e box 37 Assessment: Completed Pain:0/10 Tubes: Patient came with milrinone pump from home since he is getting infusion at home. Family  returned milrinone pump. Medication administered via regular hospital  IV pump.  Safety Measures: Safety Fall Prevention Plan has been discussed  Admission:  3 [place] Orientation: Patient has been orientated to the room, unit and staff.  Family: at bedside  Orders to be reviewed and implemented. Will continue to monitor the patient. Call light has been placed within reach and bed alarm has been activated.   Venetia Night, RN Phone: [phone]

## 2016-10-09 NOTE — Consult Note (Addendum)
[facility] Nurse wound consult note Reason for Consult: Consult requested for buttocks and back.  Pt is very emaciated and states he spends most of his time in a recliner at home; wounds have been present "for awhile." Wound type: Left buttock with stage 2 pressure injury; .2X.2X.1cm, dry scab removed from outer wound, revealing pink moist wound bed without odor or drainage. Middle backbone protrudes; there are 3 areas of deep tissue injury, each approx 2X.2cm dark reddish purple. Pressure Injury POA: Yes Dressing procedure/placement/frequency: Foam dressings to protect from shear and promote healing. Son at the bedside to discuss plan of care.  Please re-consult if further assistance is needed.  Thank-you,  Julien Girt MSN, [place], [place], [place], [place]

## 2016-10-09 NOTE — Progress Notes (Signed)
Paged pharmacy regarding pt milrinone drip order   Trevor Simpson

## 2016-10-09 NOTE — Progress Notes (Signed)
PROGRESS NOTE    Trevor Simpson  TTS:[id number]  DOB: [birth year]  DOA: 10/08/2016 PCP: [name], MD Cardiologist: Dr. [name]  Brief Admission Hx:  Trevor Simpson is an 82 y.o. 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   MDM/Assessment & Plan:   1. HCAP  - Pt presents with productive cough, dyspnea, he is clinically improving with IV cefepime and vancomycin - Found to have LLL consolidation on CXR  - Had recently improved briefly with doxycycline and prednisone before worsening over the past several days PTA - Given his home infusion therapy, continue vancomycin and cefepime pending culture data - Blood cultures collected in ED, sputum culture and gram stain requested, checking strep pneumo antigen   2. Chronic combined CHF - Appears stable on admission - TTE (01/19/16) with EF 25%, ungraded diastolic dysfunction, diffuse HK, moderate MR, moderate LAE, at least mild AS  - Dependent on milrinone infusion chronically, will continue  - Continue lasix 40 qAM and 80 qPM - Follow I/O's and daily wts  - Heart failure team consulted 7/24  3. Chronic atrial fibrillation   - CHADS-VASc is 9 (age x2, CHF, CAD)  - Continue Eliquis, amiodarone   4. CKD stage III  - SCr is 1.63 on admission, consistent with his apparent baseline  - Continue milrinone infusion    5. Emphysema with exacerbation - Occasional wheezes on admission, likely precipitated by infection - Continue inhalers, prn nebs  - Treat with systemic steroid, taper as tolerated  - Sputum culture and abx as above   6. Hypokalemia - Serum potassium is 3.1 on admission  - Continue 40 mEq oral supplement BID as at home, also  give 20 mEq IV, continue mag  - Continue telemetry monitoring   7. GERD - Had mild duodenitis on remote EGD  - Continue daily PPI    DVT prophylaxis: Eliquis Code Status: Full  Family Communication: Son, daughter, and wife updated at bedside Disposition Plan: Admit to telemetry unit  Consults called: None Admission status: Inpatient  Subjective: Pt says that he is already starting to feel better.   He seems to be tolerating the IV antibiotics.  He is less SOB.   He denies CP.    Objective: Vitals:   10/09/16 0210 10/09/16 0654 10/09/16 0754 10/09/16 0835  BP: (!) 116/56 (!) 113/58  (!) 120/53  Pulse: 74 74  75  Resp: 16 19  18   Temp: (!) 97.5 F (36.4 C) 97.6 F (36.4 C)    TempSrc: Oral Oral    SpO2: 94% 94% 96%   Weight:  63.5 kg (139 lb 14.4 oz)    Height:        Intake/Output Summary (Last 24 hours) at 10/09/16 0950 Last data filed at 10/09/16 0657  Gross per 24 hour  Intake           744.76 ml  Output             1000 ml  Net          -255.24 ml   Filed Weights   10/08/16 2053 10/08/16 2118 10/09/16 0654  Weight: 62.1 kg (137 lb) 63.2 kg (139 lb 6.4 oz) 63.5 kg (139 lb 14.4 oz)     REVIEW OF SYSTEMS  As  per history otherwise all reviewed and reported negative  Exam:  General exam: chronically ill appearing male, NAD, cooperative.   Respiratory system: No increased work of breathing. Cardiovascular system: normal S1 & S2 heard. Gastrointestinal system: Abdomen is nondistended, soft and nontender. Normal bowel sounds heard. Central nervous system: Alert and oriented. No focal neurological deficits. Extremities: no cyanosis.  Data Reviewed: Basic Metabolic Panel:  Recent Labs Lab 10/08/16 1700 10/09/16 0426  NA 134* 135  K 3.1* 3.7  CL 93* 95*  CO2 31 32  GLUCOSE 159* 186*  BUN 51* 52*  CREATININE 1.63* 1.50*  CALCIUM 9.3 8.9   Liver Function Tests: No results for input(s): AST, ALT, ALKPHOS, BILITOT, PROT, ALBUMIN in the last 168  hours. No results for input(s): LIPASE, AMYLASE in the last 168 hours. No results for input(s): AMMONIA in the last 168 hours. CBC:  Recent Labs Lab 10/08/16 1700  WBC 7.6  HGB 14.8  HCT 43.4  MCV 90.6  PLT 163   Cardiac Enzymes: No results for input(s): CKTOTAL, CKMB, CKMBINDEX, TROPONINI in the last 168 hours. CBG (last 3)  No results for input(s): GLUCAP in the last 72 hours. No results found for this or any previous visit (from the past 240 hour(s)).   Studies: Dg Chest 2 View  Result Date: 10/08/2016 CLINICAL DATA:  83 year old with 2-3 week history of intermittent cough and shortness of breath. Patient recently completed a course of antibiotic therapy. Current history of abdominal aortic aneurysm, atrial fibrillation, CHF and coronary artery disease. Indwelling biventricular pacing defibrillator. EXAM: CHEST  2 VIEW COMPARISON:  09/18/2016, 05/22/2016 and earlier. FINDINGS: Cardiac silhouette markedly enlarged, unchanged. Thoracic aorta atherosclerotic, unchanged. Hilar and mediastinal contours otherwise unremarkable. Left subclavian biventricular pacing defibrillator unchanged. Right jugular central venous catheter tip in the upper right atrium, unchanged. Stable hyperinflation and mild central peribronchial thickening. Interval development of streaky and patchy airspace opacities in the left lower lobe. Stable linear scarring in the right middle lobe. Mild pulmonary venous hypertension without overt edema. Stable small bilateral pleural effusions since the examination 3 weeks ago. IMPRESSION: 1. Left lower lobe bronchopneumonia superimposed upon COPD/emphysema. 2. Stable marked cardiomegaly. Pulmonary venous hypertension without overt pulmonary edema. 3. Stable small bilateral pleural effusions since the examination 3 weeks ago. 4.  Aortic Atherosclerosis (ICD10-170.0) 5.  Emphysema. (ION62-X52.9) Electronically Signed   By: Evangeline Dakin M.D.   On: 10/08/2016 18:45   Scheduled  Meds: . ALPRAZolam  0.375 mg Oral QHS  . amiodarone  200 mg Oral Daily  . apixaban  2.5 mg Oral BID  . atorvastatin  20 mg Oral q1800  . calcium carbonate  1 tablet Oral Q breakfast  . digoxin  0.0625 mg Oral Daily  . furosemide  80 mg Oral BID  . levothyroxine  112 mcg Oral Once per day on Mon Tue Wed Thu Fri Sat  . loratadine  10 mg Oral Daily  . magnesium oxide  400 mg Oral Daily  . multivitamin with minerals  1 tablet Oral Daily  . omega-3 acid ethyl esters  1 g Oral Daily  . pantoprazole  40 mg Oral Daily  . potassium chloride SA  40 mEq Oral BID  . predniSONE  50 mg Oral Q breakfast  . sodium chloride flush  3 mL Intravenous Q12H  . umeclidinium-vilanterol  1 puff Inhalation Daily  . vitamin C  500 mg Oral Daily   Continuous Infusions: . sodium chloride    . ceFEPime (MAXIPIME) IV Stopped (10/08/16 2355)  .  milrinone 0.375 mcg/kg/min (10/09/16 0616)  . vancomycin      Principal Problem:   HCAP (healthcare-associated pneumonia) Active Problems:   Essential hypertension   Coronary atherosclerosis   ATRIAL FIBRILLATION, PAROXYSMAL   Pulmonary emphysema (HCC)   Chronic systolic heart failure (HCC)   Chronic kidney disease (CKD), stage III (moderate)   Hypokalemia   Pressure injury of skin   End stage congestive heart failure ([place])  Time spent:   Irwin Brakeman, MD, FAAFP [facility] Pager [phone]  If 7PM-7AM, please contact night-coverage [email or web address] Password [email or web address] 10/09/2016, 9:50 AM    LOS: 1 day

## 2016-10-09 NOTE — Progress Notes (Signed)
Paged MD regarding pt requesting xanax 3 times daily Awaiting call back   Dawnelle Warman Leory Plowman

## 2016-10-09 NOTE — Progress Notes (Signed)
Pt educated about safety and importance of bed alarm during the night however pt refuses to be on bed alarm. Will continue to round on patient.   Audi Wettstein, RN    

## 2016-10-09 NOTE — Consult Note (Signed)
Advanced Heart Failure Team Consult Note   Primary Physician: Dr. Maudie Mercury Primary Cardiologist:  Dr. Haroldine Laws   Reason for Consultation: Acute on chronic systolic CHF   HPI:    Trevor Simpson is seen today for evaluation of acute on chronic systolic CHF at the request of Dr. Wynetta Emery.   Trevor Simpson is a 83 year old male with a past medical history of COPD, symptomatic bradycardia, PAF, HTN, PAD, CVA, and chronic systolic CHF due to ischemic CM (Echo 01/2016 EF 25%) s/p CRT-D 09/2013.   Admitted 02/17/16-02/21/16 with CP and fatigue. RHC showed Fick cardiac output/index 2.4/1.3. Started on milrinone and sent home with milrinone 0.375 mcg.   He presented to the ED on 10/08/16 with SOB, also coughing up blood. She had been started on doxycycline and prednisone by Pulmonology 7 days prior to admission. He was started on cefepime + Vancomycin for HCAP (has LLL consolidation on CXR). He has been feeling poorly for about 3 weeks, up until that time he was working out at [employer] a few times a week without SOB. Last seen in clinic in June 2018, it was felt that he would benefit from palliative care with suspected prognosis of <6 months. Patient and family wished to defer this. Weight in clinic was 145 pounds, weight today is 139 pounds. He says he feels much better since starting on IV antibiotics. Breathing much better, no longer SOB at rest. He can walk back and forth to the bathroom without becoming SOB.      Review of Systems: [y] = yes, [ ]  = no   General: Weight gain [ ] ; Weight loss [ ] ; Anorexia [ ] ; Fatigue Blue.Reese ]; Fever [ ] ; Chills Blue.Reese ]; Weakness Blue.Reese ]  Cardiac: Chest pain/pressure [ ] ; Resting SOB [ ] ; Exertional SOB [ y]; Orthopnea [ ] ; Pedal Edema [ ] ; Palpitations [ ] ; Syncope [ ] ; Presyncope [ ] ; Paroxysmal nocturnal dyspnea[ ]   Pulmonary: Cough Blue.Reese ]; Wheezing[ ] ; Hemoptysis[y ]; Sputum [ ] ; Snoring [ ]   GI: Vomiting[ ] ; Dysphagia[ ] ; Melena[ ] ; Hematochezia [ ] ; Heartburn[ ] ; Abdominal  pain [ ] ; Constipation [ ] ; Diarrhea [ ] ; BRBPR [ ]   GU: Hematuria[ ] ; Dysuria [ ] ; Nocturia[ ]   Vascular: Pain in legs with walking [ ] ; Pain in feet with lying flat [ ] ; Non-healing sores [ ] ; Stroke [ ] ; TIA [ ] ; Slurred speech [ ] ;  Neuro: Headaches[ ] ; Vertigo[ ] ; Seizures[ ] ; Paresthesias[ ] ;Blurred vision [ ] ; Diplopia [ ] ; Vision changes [ ]   Ortho/Skin: Arthritis Blue.Reese ]; Joint pain [ ] ; Muscle pain [ ] ; Joint swelling [ ] ; Back Pain [ ] ; Rash [ ]   Psych: Depression[ ] ; Anxiety[ ]   Heme: Bleeding problems [ ] ; Clotting disorders [ ] ; Anemia [ ]   Endocrine: Diabetes [ ] ; Thyroid dysfunction[ ]   Home Medications Prior to Admission medications   Medication Sig Start Date End Date Taking? Authorizing Provider  ALPRAZolam (XANAX) 0.25 MG tablet Take 0.375 mg by mouth at bedtime.   Yes [provider]  amiodarone (PACERONE) 200 MG tablet Take 1 tablet (200 mg total) by mouth daily. Patient taking differently: Take 200 mg by mouth at bedtime.  05/28/16  Yes Bensimhon, Shaune Pascal, MD  apixaban (ELIQUIS) 2.5 MG TABS tablet Take 1 tablet (2.5 mg total) by mouth 2 (two) times daily. 01/19/16  Yes Bensimhon, Shaune Pascal, MD  atorvastatin (LIPITOR) 20 MG tablet Take 20 mg by mouth daily at 6 PM.  Yes [provider]  B Complex-C (SUPER B COMPLEX) TABS Take 1 tablet by mouth daily.    Yes [provider]  calcium carbonate (OS-CAL) 600 MG TABS Take 600 mg by mouth daily with breakfast.    Yes [provider]  cetirizine (ZYRTEC) 10 MG tablet Take 10 mg by mouth daily.  08/24/10  Yes [provider]  digoxin (LANOXIN) 0.125 MG tablet Take 0.5 tablets (0.0625 mg total) by mouth daily. 02/06/16  Yes Larey Dresser, MD  doxycycline (VIBRA-TABS) 100 MG tablet Take 1 tablet (100 mg total) by mouth 2 (two) times daily. 10/01/16  Yes Mannam, Praveen, MD  doxylamine, Sleep, (UNISOM) 25 MG tablet Take 25 mg by mouth at bedtime.   Yes [provider]  fluticasone  (FLONASE) 50 MCG/ACT nasal spray Place 1 spray into both nostrils daily as needed for allergies. Reported on 04/11/2015 09/23/14  Yes [provider]  furosemide (LASIX) 80 MG tablet Take 40-80 mg by mouth. 40 mg in the morning and 80 mg in the evening   Yes [provider]  levothyroxine (SYNTHROID, LEVOTHROID) 112 MCG tablet Take 112 mcg by mouth See admin instructions. Pt takes Monday- Saturday - skips on Sundays   Yes [provider]  magnesium gluconate (MAGONATE) 500 MG tablet Take 500 mg by mouth daily.    Yes [provider]  Multiple Vitamin (MULTIVITAMIN WITH MINERALS) TABS Take 1 tablet by mouth daily.   Yes [provider]  Omega-3 Fatty Acids (FISH OIL) 1200 MG CAPS Take 1,200 mg by mouth daily.    Yes [provider]  omeprazole (PRILOSEC) 20 MG capsule Take 20 mg by mouth daily as needed (indigestion).    Yes [provider]  potassium chloride SA (KLOR-CON M20) 20 MEQ tablet Take 2 tablets (40 mEq total) by mouth 2 (two) times daily. 07/31/16  Yes Bensimhon, Shaune Pascal, MD  predniSONE (DELTASONE) 10 MG tablet 4 tabs x 2 days, 3 tabs x 2 days, 2 tabs x 2 days, 1 tab x 2 days then stop 10/01/16  Yes Mannam, Praveen, MD  Saw Palmetto, Serenoa repens, (SAW PALMETTO PO) Take 1 capsule by mouth 2 (two) times daily.   Yes [provider]  senna (SENOKOT) 8.6 MG TABS tablet Take 1 tablet by mouth daily as needed for mild constipation.   Yes [provider]  umeclidinium-vilanterol (ANORO ELLIPTA) 62.5-25 MCG/INH AEPB Inhale 1 puff into the lungs daily. 09/18/16  Yes Mannam, Praveen, MD  vitamin C (ASCORBIC ACID) 500 MG tablet Take 500 mg by mouth daily.     Yes [provider]  milrinone (PRIMACOR) 20 MG/100 ML SOLN infusion Inject 26.775 mcg/min into the vein continuous. 02/21/16   Mikhail, Velta Addison, DO  nitroGLYCERIN (NITROSTAT) 0.4 MG SL tablet Place 1 tablet (0.4 mg total) under the tongue every 5 (five) minutes  as needed for chest pain. 09/14/15   Evans Lance, MD    Past Medical History: Past Medical History:  Diagnosis Date  . AAA (abdominal aortic aneurysm) ([place])    repaired  . Anxiety   . Arthritis    "left elbow and shoulder" (09/24/2013)  . Atrial fibrillation ([place])   . Automatic implantable cardioverter-defibrillator in situ   . Carotid artery occlusion   . Colon polyp   . Congestive heart failure ([place])   . Coronary artery disease    a. LHC (08/2012): Lmain: long, 20% stenosis distally LAD: mod calcified and mild dz prox. diff dz proximally  30%, first diagonal mild dz prox and small, Ramus intermediate brach lg bifurcates mid vessel prox. 90-95% stenosis LCx: relatively sml, rise to single marginal and contiine AV groove, 1st marg. diff 60-70% dz stenosis prox RCA: occ. prox. R-L coll, successful stent remus interm branch BMS  . GERD (gastroesophageal reflux disease)   . H/O blood clots   . HCAP (healthcare-associated pneumonia) 2014  . Hyperlipidemia   . Hypertension   . Hypothyroidism   . Ischemic cardiomyopathy   . Myocardial infarction ([place])   . Other and unspecified hyperlipidemia   . Pneumonia   . PVD (peripheral vascular disease) ([place])    s/p renal artery stent 20-04  . Sinoatrial node dysfunction ([facility])   . Stroke [facility]) 2014   left sided affected no residual effects  . Systolic congestive heart failure ([place])    a. ICM b.     Past Surgical History: Past Surgical History:  Procedure Laterality Date  . ABDOMINAL AORTIC ANEURYSM REPAIR  05/17/1993  . BI-VENTRICULAR IMPLANTABLE CARDIOVERTER DEFIBRILLATOR N/A 09/24/2013   Procedure: BI-VENTRICULAR IMPLANTABLE CARDIOVERTER DEFIBRILLATOR  (CRT-D);  Surgeon: Evans Lance, MD;  Location: [facility] CATH LAB;  Service: Cardiovascular;  Laterality: N/A;  . BI-VENTRICULAR IMPLANTABLE CARDIOVERTER DEFIBRILLATOR  (CRT-D)  09/24/2013  . CARDIAC CATHETERIZATION N/A 02/17/2016   Procedure: Right Heart Cath;  Surgeon: Jolaine Artist, MD;   Location: [place] CV LAB;  Service: Cardiovascular;  Laterality: N/A;  . CARDIAC PACEMAKER PLACEMENT  08/01/2009   st jude dual chamber; explanted 09/24/2013  . CARDIOVERSION N/A 09/23/2012   Procedure: CARDIOVERSION;  Surgeon: Lelon Perla, MD;  Location: [facility] ENDOSCOPY;  Service: Cardiovascular;  Laterality: N/A;  . CAROTID ENDARTERECTOMY Right 11-25-12  . CATARACT EXTRACTION W/ INTRAOCULAR LENS  IMPLANT, BILATERAL Bilateral   . CENTRAL VENOUS CATHETER INSERTION  02/17/2016   Procedure: Insertion Central Line Adult;  Surgeon: Jolaine Artist, MD;  Location: [place] CV LAB;  Service: Cardiovascular;;  . COLONOSCOPY    . CORONARY ANGIOPLASTY WITH STENT PLACEMENT  08/2012   "1"  . CORONARY ARTERY BYPASS GRAFT  05/17/1993   "CABG X4"  . Defribrillator    . ENDARTERECTOMY Right 11/25/2012   Procedure: ENDARTERECTOMY CAROTID With Dacron Patch Angioplasty;  Surgeon: Elam Dutch, MD;  Location: [place];  Service: Vascular;  Laterality: Right;  . ESOPHAGOGASTRODUODENOSCOPY    . INGUINAL HERNIA REPAIR Bilateral    "one at a time"  . IR GENERIC HISTORICAL  02/20/2016   IR US GUIDE VASC ACCESS RIGHT 02/20/2016 Arne Cleveland, MD [facility]INTER[facility] RAD  . IR GENERIC HISTORICAL  02/20/2016   IR FLUORO GUIDE CV LINE RIGHT 02/20/2016 Arne Cleveland, MD [facility]  . LEFT AND RIGHT HEART CATHETERIZATION WITH CORONARY ANGIOGRAM N/A 09/15/2012   Procedure: LEFT AND RIGHT HEART CATHETERIZATION WITH CORONARY ANGIOGRAM;  Surgeon: Peter M Martinique, MD;  Location: [facility] CATH LAB;  Service: Cardiovascular;  Laterality: N/A;  . PERCUTANEOUS CORONARY STENT INTERVENTION (PCI-S)  09/15/2012   Procedure: PERCUTANEOUS CORONARY STENT INTERVENTION (PCI-S);  Surgeon: Peter M Martinique, MD;  Location: [facility] CATH LAB;  Service: Cardiovascular;;  . RENAL ARTERY STENT    . RIGHT HEART CATHETERIZATION N/A 08/31/2013   Procedure: RIGHT HEART CATH;  Surgeon: Jolaine Artist, MD;  Location: [facility] CATH LAB;  Service: Cardiovascular;  Laterality:  N/A;  . TEE WITHOUT CARDIOVERSION N/A 09/23/2012   Procedure: TRANSESOPHAGEAL ECHOCARDIOGRAM (TEE);  Surgeon: Lelon Perla, MD;  Location: [place];  Service: Cardiovascular;  Laterality: N/A;  . TRANSLUMINAL ANGIOPLASTY  Family History: Family History  Problem Relation Age of Onset  . CAD Father   . Hypertension Father   . Heart disease Father   . CAD Brother   . Heart disease Brother   . Heart attack Brother   . Diabetes Brother   . Hypertension Mother   . Hypertension Daughter   . Vascular Disease Daughter        Right Carotid  . Colon cancer Son   . Lung cancer Sister   . Heart disease Sister   . Hypertension Sister   . Colon polyps Brother        x2    Social History: Social History   Social History  . Marital status: Married    Spouse name: N/A  . Number of children: 4  . Years of education: N/A   Occupational History  . Retired    Social History Main Topics  . Smoking status: Former Smoker    Packs/day: 1.00    Years: 50.00    Types: Cigarettes    Quit date: 05/17/1993  . Smokeless tobacco: Current User    Types: Chew  . Alcohol use No  . Drug use: No  . Sexual activity: Not Currently   Other Topics Concern  . None   Social History Narrative  . None    Allergies:  Allergies  Allergen Reactions  . Codeine     "tripped out on it"  . Morphine     "tripped out on it"    Objective:    Vital Signs:   Temp:  [97.5 F (36.4 C)-98 F (36.7 C)] 98 F (36.7 C) (07/24 1148) Pulse Rate:  [59-99] 74 (07/24 1148) Resp:  [16-22] 20 (07/24 1148) BP: (106-123)/(47-71) 109/55 (07/24 1148) SpO2:  [94 %-100 %] 95 % (07/24 1148) Weight:  [137 lb (62.1 kg)-145 lb 8.1 oz (66 kg)] 139 lb 14.4 oz (63.5 kg) (07/24 0654) Last BM Date: 10/07/16  Weight change: Filed Weights   10/08/16 2053 10/08/16 2118 10/09/16 0654  Weight: 137 lb (62.1 kg) 139 lb 6.4 oz (63.2 kg) 139 lb 14.4 oz (63.5 kg)    Intake/Output:   Intake/Output Summary (Last 24  hours) at 10/09/16 1156 Last data filed at 10/09/16 1014  Gross per 24 hour  Intake           744.76 ml  Output             1250 ml  Net          -505.24 ml      Physical Exam    General: Elderly male, NAD.  HEENT: Thin, temporal wasting. Neck: supple. JVP flat.  . Carotids 2+ bilat; no bruits. No lymphadenopathy or thyromegaly appreciated. Cor: PMI nondisplaced. Regular rate & rhythm. 2/6 TR murmur. 2/6 HSM at apex.RIJ tunneled PICC Lungs: clear bilaterally.  Abdomen: soft, nontender, nondistended. No hepatosplenomegaly. No bruits or masses. Good bowel sounds. Extremities: no cyanosis, clubbing, rash, edema. Warm.  Neuro: alert & orientedx3, cranial nerves grossly intact. moves all 4 extremities w/o difficulty. Affect pleasant   Telemetry   AV paced. - personally reviewed.   EKG    AV Paced.   Labs   Basic Metabolic Panel:  Recent Labs Lab 10/08/16 1700 10/09/16 0426  NA 134* 135  K 3.1* 3.7  CL 93* 95*  CO2 31 32  GLUCOSE 159* 186*  BUN 51* 52*  CREATININE 1.63* 1.50*  CALCIUM 9.3 8.9     CBC:  Recent Labs Lab  10/08/16 1700  WBC 7.6  HGB 14.8  HCT 43.4  MCV 90.6  PLT 163    Cardiac Enzymes: No results for input(s): CKTOTAL, CKMB, CKMBINDEX, TROPONINI in the last 168 hours.  BNP: BNP (last 3 results)  Recent Labs  03/01/16 1116 03/29/16 1219 05/14/16 1243  BNP 1,300.7* 1,523.5* 1,089.8*    ProBNP (last 3 results) No results for input(s): PROBNP in the last 8760 hours.   CBG: No results for input(s): GLUCAP in the last 168 hours.  Coagulation Studies:  Recent Labs  10/08/16 2223  LABPROT 18.5*  INR 1.53     Imaging   Dg Chest 2 View  Result Date: 10/08/2016 CLINICAL DATA:  83 year old with 2-3 week history of intermittent cough and shortness of breath. Patient recently completed a course of antibiotic therapy. Current history of abdominal aortic aneurysm, atrial fibrillation, CHF and coronary artery disease. Indwelling  biventricular pacing defibrillator. EXAM: CHEST  2 VIEW COMPARISON:  09/18/2016, 05/22/2016 and earlier. FINDINGS: Cardiac silhouette markedly enlarged, unchanged. Thoracic aorta atherosclerotic, unchanged. Hilar and mediastinal contours otherwise unremarkable. Left subclavian biventricular pacing defibrillator unchanged. Right jugular central venous catheter tip in the upper right atrium, unchanged. Stable hyperinflation and mild central peribronchial thickening. Interval development of streaky and patchy airspace opacities in the left lower lobe. Stable linear scarring in the right middle lobe. Mild pulmonary venous hypertension without overt edema. Stable small bilateral pleural effusions since the examination 3 weeks ago. IMPRESSION: 1. Left lower lobe bronchopneumonia superimposed upon COPD/emphysema. 2. Stable marked cardiomegaly. Pulmonary venous hypertension without overt pulmonary edema. 3. Stable small bilateral pleural effusions since the examination 3 weeks ago. 4.  Aortic Atherosclerosis (ICD10-170.0) 5.  Emphysema. ([id number]) Electronically Signed   By: Evangeline Dakin M.D.   On: 10/08/2016 18:45      Medications:     Current Medications: . ALPRAZolam  0.375 mg Oral QHS  . amiodarone  200 mg Oral Daily  . apixaban  2.5 mg Oral BID  . atorvastatin  20 mg Oral q1800  . calcium carbonate  1 tablet Oral Q breakfast  . digoxin  0.0625 mg Oral Daily  . furosemide  80 mg Oral BID  . levothyroxine  112 mcg Oral Once per day on Mon Tue Wed Thu Fri Sat  . loratadine  10 mg Oral Daily  . magnesium oxide  400 mg Oral Daily  . multivitamin with minerals  1 tablet Oral Daily  . omega-3 acid ethyl esters  1 g Oral Daily  . pantoprazole  40 mg Oral Daily  . potassium chloride SA  40 mEq Oral BID  . predniSONE  50 mg Oral Q breakfast  . sodium chloride flush  3 mL Intravenous Q12H  . umeclidinium-vilanterol  1 puff Inhalation Daily  . vitamin C  500 mg Oral Daily     Infusions: .  sodium chloride    . ceFEPime (MAXIPIME) IV Stopped (10/08/16 2355)  . milrinone 0.375 mcg/kg/min (10/09/16 0616)  . vancomycin         Patient Profile   Mr. Bill is a 83 year old male with a past medical history of COPD, symptomatic bradycardia, PAF, HTN, PAD, CVA, and chronic systolic CHF due to ischemic CM (Echo 01/2016 EF 25%) s/p CRT-D 09/2013. Admitted for HCAP on 10/08/16   Assessment/Plan   1. HCAP: - Treatment per primary team with cefepime and vanc.  -  Blood cultures pending. - Afebrile, no leukocytosis.   2. Chronic systolic CHF: ICM, EF 47%. S/p CRT-D.  -  Remains on 0.375 mcg milrinone.  - CO ox 54%. - Volume status stable on exam, continue lasix 80 mg BID.  - Not a candidate for advanced therapies due to age and co-morbidities.  -  Has not tolerated ARB in the past with hypotension - no beta blocker with low output.   3. PAF: Had DCCV in 2014. No AF by recent device check.  - Continue Amio 200mg  daily - Continue Eliquis 2.5 mg BID  4. CAD - No signs or symptoms of ischemia - No ASA with stable CAD and Eliquis.   5. CKD stage III:  - Creatinine stable.   Length of Stay: [place], NP  10/09/2016, 11:56 AM  Advanced Heart Failure Team Pager [phone] (M-F; 7a - 4p)  Please contact [facility] for night-coverage after hours (4p -7a ) and weekends on [email or web address]  Patient seen with NP, agree with the above note.  Mr Probert appears stable from a cardiac standpoint, no JVD and stable BP.  He does have a LLL PNA.  He is being treated with IV antibiotics, awaiting culture data.  He feels like he is improving.    Continue his current milrinone.  Would also continue current Lasix dose at 80 mg po bid.    He has history of PAF, on Eliquis.  He is in NSR with BiV pacing.  Continue Eliquis and amiodarone to maintain NSR.   We will follow at a distance as he is stable from a cardiac standpoint.  Please call with any changes in cardiac status.   Loralie Champagne 10/09/2016 1:39 PM

## 2016-10-09 NOTE — Progress Notes (Signed)
Pt family at bedside upset that RN set bed alarm. Explained that it is for safety issues. Pt family explains that "I have been doing this all night." RN explained to family that RN has not seen pt ambulate and would like to visualize stability. Explained that pt has IV poles and tubing that increase his fall risk. Pt family states he does not need help and does not want the bed alarm.   Rory Xiang Leory Plowman

## 2016-10-10 DIAGNOSIS — I10 Essential (primary) hypertension: Secondary | ICD-10-CM

## 2016-10-10 DIAGNOSIS — I5022 Chronic systolic (congestive) heart failure: Secondary | ICD-10-CM | POA: Diagnosis not present

## 2016-10-10 DIAGNOSIS — E876 Hypokalemia: Secondary | ICD-10-CM

## 2016-10-10 DIAGNOSIS — N183 Chronic kidney disease, stage 3 (moderate): Secondary | ICD-10-CM

## 2016-10-10 DIAGNOSIS — N179 Acute kidney failure, unspecified: Secondary | ICD-10-CM

## 2016-10-10 LAB — COOXEMETRY PANEL
Carboxyhemoglobin: 1.1 % (ref 0.5–1.5)
Methemoglobin: 1.2 % (ref 0.0–1.5)
O2 SAT: 48.8 %
Total hemoglobin: 13.4 g/dL (ref 12.0–16.0)

## 2016-10-10 LAB — PROCALCITONIN: Procalcitonin: <0.1 ng/mL

## 2016-10-10 LAB — BASIC METABOLIC PANEL
Anion gap: 9 (ref 5–15)
BUN: 60 mg/dL — ABNORMAL HIGH (ref 6–20)
CO2: 30 mmol/L (ref 22–32)
Calcium: 9.2 mg/dL (ref 8.9–10.3)
Chloride: 96 mmol/L — ABNORMAL LOW (ref 101–111)
Creatinine, Ser: 1.89 mg/dL — ABNORMAL HIGH (ref 0.61–1.24)
GFR calc Af Amer: 37 mL/min — ABNORMAL LOW (ref >60)
GFR calc non Af Amer: 32 mL/min — ABNORMAL LOW (ref >60)
Glucose, Bld: 166 mg/dL — ABNORMAL HIGH (ref 65–99)
Potassium: 4.9 mmol/L (ref 3.5–5.1)
Sodium: 135 mmol/L (ref 135–145)

## 2016-10-10 LAB — CBC
HEMATOCRIT: 39.7 % (ref 39.0–52.0)
HEMOGLOBIN: 13 g/dL (ref 13.0–17.0)
MCH: 29.7 pg (ref 26.0–34.0)
MCHC: 32.7 g/dL (ref 30.0–36.0)
MCV: 90.6 fL (ref 78.0–100.0)
PLATELETS: 165 10*3/uL (ref 150–400)
RBC: 4.38 MIL/uL (ref 4.22–5.81)
RDW: 15 % (ref 11.5–15.5)
WBC: 10.2 10*3/uL (ref 4.0–10.5)

## 2016-10-10 MED ORDER — PREDNISONE 20 MG PO TABS
30.0000 mg | ORAL_TABLET | Freq: Every day | ORAL | Status: DC
  Administered 2016-10-11: 30 mg via ORAL
  Filled 2016-10-10: qty 1

## 2016-10-10 MED ORDER — ALPRAZOLAM 0.25 MG PO TABS
0.3750 mg | ORAL_TABLET | Freq: Every day | ORAL | Status: DC
  Administered 2016-10-10 – 2016-10-11 (×2): 0.375 mg via ORAL
  Filled 2016-10-10 (×2): qty 2

## 2016-10-10 MED ORDER — ALPRAZOLAM 0.25 MG PO TABS
0.1250 mg | ORAL_TABLET | Freq: Three times a day (TID) | ORAL | Status: DC
  Administered 2016-10-11 (×3): 0.125 mg via ORAL
  Filled 2016-10-10 (×3): qty 1

## 2016-10-10 MED ORDER — POTASSIUM CHLORIDE CRYS ER 20 MEQ PO TBCR: 40.0000 meq | EXTENDED_RELEASE_TABLET | Freq: Every day | ORAL | Status: DC

## 2016-10-10 MED ORDER — DEXTROSE 5 % IV SOLN
1.0000 g | INTRAVENOUS | Status: DC
  Administered 2016-10-10: 1 g via INTRAVENOUS
  Filled 2016-10-10: qty 1

## 2016-10-10 MED ORDER — FUROSEMIDE 80 MG PO TABS
80.0000 mg | ORAL_TABLET | Freq: Two times a day (BID) | ORAL | Status: DC
  Filled 2016-10-10: qty 1

## 2016-10-10 MED ORDER — DIPHENHYDRAMINE HCL 12.5 MG/5ML PO ELIX: 12.5000 mg | ORAL_SOLUTION | Freq: Once | ORAL | Status: DC

## 2016-10-10 MED ORDER — ALPRAZOLAM 0.25 MG PO TABS: 0.2500 mg | ORAL_TABLET | Freq: Once | ORAL | Status: DC

## 2016-10-10 NOTE — Progress Notes (Signed)
Patient stable for the rest of the shift. Stated that he is feeling better and he finally got some rest. VS stable. Will continue to monitor.  Johnmark Geiger, RN

## 2016-10-10 NOTE — Progress Notes (Signed)
PROGRESS NOTE   Trevor Simpson  ZES:[id number]    DOB: Nov 08, 1933    DOA: 10/08/2016  PCP: [name], MD   I have briefly reviewed patients previous medical records in [facility].  Brief Narrative:  83 year old male with PMH of COPD, symptomatic bradycardia, PAF, HTN, PAD, CVA, chronic systolic CHF due to ischemic cardiomyopathy (2-D echo 01/2016: EF 25%), s/p CRT-D 09/2013, has been on milrinone drip at home since December 2017, presented to ED with dyspnea and coughing up some blood. Had been started on oral doxycycline and prednisone by pulmonology 7 days prior to admission. Treating for HCAP/LLL consolidation on chest x-ray. Advanced CHF team saw and signed off.   Assessment & Plan:   Principal Problem:   HCAP (healthcare-associated pneumonia) Active Problems:   Essential hypertension   Coronary atherosclerosis   ATRIAL FIBRILLATION, PAROXYSMAL   Pulmonary emphysema (HCC)   Chronic systolic heart failure (HCC)   Chronic kidney disease (CKD), stage III (moderate)   Hypokalemia   Pressure injury of skin   End stage congestive heart failure ([place])   1. Healthcare associated pneumonia/left lower lobe: Chest x-ray 7/23 shows left lower lobe bronchopneumonia. Blood cultures 2: Negative to date. Day 3 IV cefepime and vancomycin. Pro-calcitonin <0.1 is reassuring. Consider transitioning to oral antibiotics on 7/26. Needs follow-up chest x-ray in 4 weeks to ensure resolution of pneumonia findings. 2. Chronic systolic CHF/ICM/EF 26%/J/F CRT-D: Continue milrinone 0.375 MCG. As per cardiology input, not candidate for advanced therapies due to age and comorbidities, has not tolerated ARB in the past due to hypotension, no beta blockers due to low output. Discussed with Dr. Aundra Dubin, Cardiology on 7/25: Temporarily hold Lasix today due to increased creatinine. 3. PAF/BiV pacing: Had DC CV in 2014. No A. fib by recent device check. Continue amiodarone 200 MG daily and Eliquis 0.5 MG twice a  day. 4. CAD: Asymptomatic of chest pain. No aspirin due to Eliquis 5. COPD exacerbation: Precipitated by pneumonia. Bronchospasm is clinically resolved. Taper steroids. 6. Hypokalemia: Replaced. 7. GERD: Continue PPI. 8. Acute on stage III chronic kidney disease: Creatinine in 2018 has fluctuated between 1.3 and 1.6. 1.6 may be his most recent baseline. Creatinine has gone up from 1.5-1.89. Discussed with cardiology, temporarily held Lasix. Follow BMP in a.m. 9. Anxiety/insomnia: Continue home dose of when necessary Xanax. 10. Wounds: Left buttock stage II pressure injury and 3 areas of DTI in the pack per WOC RN on sedation on 7/24. 11. Adult failure to thrive: As per cardiology, poor prognosis but their discussion with patient and family in June 2018, they wished to defer palliative care discussion.   DVT prophylaxis: On Eliquis Code Status: Full Family Communication: Discussed in detail with patient's son at bedside. Updated care and answered questions. Disposition: Requests PT evaluation. Possible discharge in the next 24-48 hours.   Consultants:  Cardiology   Procedures:  None  Antimicrobials:  IV cefepime and vancomycin    Subjective: Overnight events noted. Issues with anxiety and insomnia. States that he was awake all night and finally went to bed at 4:30 this morning. Occasional mild dyspnea. No chest pain. Cough improved, mild intermittent and may be nonproductive.   ROS: No fever or chills reported.  Objective:  Vitals:   10/10/16 0636 10/10/16 0730 10/10/16 1201 10/10/16 1618  BP: 115/70  108/63 114/66  Pulse: 72  78 73  Resp: 18  18 16   Temp: 97.7 F (36.5 C)  (!) 97.5 F (36.4 C) (!) 97.5 F (36.4  C)  TempSrc: Oral  Oral Oral  SpO2: 100%  91% 91%  Weight:  64.1 kg (141 lb 4.8 oz)    Height:        Examination:  General exam: Pleasant elderly male, frail and chronically ill looking, lying comfortably propped up in bed. Does not appear in any  distress. Respiratory system: Slightly diminished breath sounds in the bases but otherwise clear to auscultation. Respiratory effort normal. Cardiovascular system: S1 & S2 heard, RRR. No JVD, murmurs, rubs, gallops or clicks. No pedal edema. Telemetry: BiV paced rhythm. Gastrointestinal system: Abdomen is nondistended, soft and nontender. No organomegaly or masses felt. Normal bowel sounds heard. Central nervous system: Alert and oriented. No focal neurological deficits. Extremities: Symmetric 5 x 5 power. Skin: No rashes, lesions or ulcers Psychiatry: Judgement and insight appear normal. Mood & affect: Slightly anxious .     Data Reviewed: I have personally reviewed following labs and imaging studies  CBC:  Recent Labs Lab 10/08/16 1700 10/10/16 0500  WBC 7.6 10.2  HGB 14.8 13.0  HCT 43.4 39.7  MCV 90.6 90.6  PLT 163 427   Basic Metabolic Panel:  Recent Labs Lab 10/08/16 1700 10/09/16 0426 10/10/16 0500  NA 134* 135 135  K 3.1* 3.7 4.9  CL 93* 95* 96*  CO2 31 32 30  GLUCOSE 159* 186* 166*  BUN 51* 52* 60*  CREATININE 1.63* 1.50* 1.89*  CALCIUM 9.3 8.9 9.2   Coagulation Profile:  Recent Labs Lab 10/08/16 2223  INR 1.53     Recent Results (from the past 240 hour(s))  Culture, blood (Routine x 2)     Status: None (Preliminary result)   Collection Time: 10/08/16  5:15 PM  Result Value Ref Range Status   Specimen Description BLOOD RIGHT FOREARM  Final   Special Requests   Final    BOTTLES DRAWN AEROBIC ONLY Blood Culture adequate volume   Culture NO GROWTH 2 DAYS  Final   Report Status PENDING  Incomplete  Culture, blood (Routine x 2)     Status: None (Preliminary result)   Collection Time: 10/08/16  7:50 PM  Result Value Ref Range Status   Specimen Description BLOOD LEFT ANTECUBITAL  Final   Special Requests   Final    BOTTLES DRAWN AEROBIC AND ANAEROBIC Blood Culture adequate volume   Culture NO GROWTH 2 DAYS  Final   Report Status PENDING  Incomplete          Radiology Studies: Dg Chest 2 View  Result Date: 10/08/2016 CLINICAL DATA:  83 year old with 2-3 week history of intermittent cough and shortness of breath. Patient recently completed a course of antibiotic therapy. Current history of abdominal aortic aneurysm, atrial fibrillation, CHF and coronary artery disease. Indwelling biventricular pacing defibrillator. EXAM: CHEST  2 VIEW COMPARISON:  09/18/2016, 05/22/2016 and earlier. FINDINGS: Cardiac silhouette markedly enlarged, unchanged. Thoracic aorta atherosclerotic, unchanged. Hilar and mediastinal contours otherwise unremarkable. Left subclavian biventricular pacing defibrillator unchanged. Right jugular central venous catheter tip in the upper right atrium, unchanged. Stable hyperinflation and mild central peribronchial thickening. Interval development of streaky and patchy airspace opacities in the left lower lobe. Stable linear scarring in the right middle lobe. Mild pulmonary venous hypertension without overt edema. Stable small bilateral pleural effusions since the examination 3 weeks ago. IMPRESSION: 1. Left lower lobe bronchopneumonia superimposed upon COPD/emphysema. 2. Stable marked cardiomegaly. Pulmonary venous hypertension without overt pulmonary edema. 3. Stable small bilateral pleural effusions since the examination 3 weeks ago. 4.  Aortic Atherosclerosis (  ICD10-170.0) 5.  Emphysema. (ZOX09-U04.9) Electronically Signed   By: Evangeline Dakin M.D.   On: 10/08/2016 18:45        Scheduled Meds: . amiodarone  200 mg Oral Daily  . apixaban  2.5 mg Oral BID  . atorvastatin  20 mg Oral q1800  . calcium carbonate  1 tablet Oral Q breakfast  . digoxin  0.0625 mg Oral Daily  . [START ON 10/11/2016] furosemide  80 mg Oral BID  . levothyroxine  112 mcg Oral Once per day on Mon Tue Wed Thu Fri Sat  . loratadine  10 mg Oral Daily  . magnesium oxide  400 mg Oral Daily  . multivitamin with minerals  1 tablet Oral Daily  . omega-3  acid ethyl esters  1 g Oral Daily  . pantoprazole  40 mg Oral Daily  . predniSONE  50 mg Oral Q breakfast  . sodium chloride flush  3 mL Intravenous Q12H  . umeclidinium-vilanterol  1 puff Inhalation Daily  . vitamin C  500 mg Oral Daily   Continuous Infusions: . sodium chloride    . ceFEPime (MAXIPIME) IV    . milrinone 0.375 mcg/kg/min (10/10/16 1027)  . vancomycin Stopped (10/09/16 2315)     LOS: 2 days     Kaylin Schellenberg, MD, FACP, FHM. [facility] Pager [phone]  If 7PM-7AM, please contact night-coverage [email or web address] Password [email or web address] 10/10/2016, 5:09 PM

## 2016-10-10 NOTE — Progress Notes (Signed)
Per NP, Baltazar Najjar additional Benadryl ordered but patient and son are refusing to take medication stating that is not going to help.   Medication not given. Paged NP for another PRN per family request. Patients VS are stable. Patient seems to not to be in distress but son and patient are stating that he " is going to have heart attack because anxiety is really bad" and he is up all night.   Paged NP Baltazar Najjar again for addition medication.  Xanax 0.25 ordered, one time order.  Upon administering Xanax 0.25 mg one time order, family stated that Patient took Unisom 25 mg today on his own at 20:00 pm, stating that family gave him only one dose and he does not have no more.  Family educated and patient that medication can not be taken on his own.   Paged NP again asking is is safe to give another Xanax and explained that patient took [place] on his own. NP discontinued order. Addition Xanax not given.   Jalesha Plotz, RN

## 2016-10-10 NOTE — Progress Notes (Signed)
Offered patient warm liquid, music, warm blanket to reduce anxiety, however nothing is helping per family and pt.  Patient is not in distress.  Paged NP Baltazar Najjar on call and informed her about situation. NP stated that additional medication cannot be ordered, however PRN Xanax 0.375 mg can be given again earlier.  Family informed. Medication administered. Will continue to monitor.  Zylen Wenig, RN

## 2016-10-10 NOTE — Discharge Summary (Deleted)
PROGRESS NOTE   AUDRA BELLARD  BSJ:[id number]    DOB: 12-Dec-1933    DOA: 10/08/2016  PCP: [name], MD   I have briefly reviewed patients previous medical records in [facility].  Brief Narrative:  83 year old male with PMH of COPD, symptomatic bradycardia, PAF, HTN, PAD, CVA, chronic systolic CHF due to ischemic cardiomyopathy (2-D echo 01/2016: EF 25%), s/p CRT-D 09/2013, has been on milrinone drip at home since December 2017, presented to ED with dyspnea and coughing up some blood. Had been started on oral doxycycline and prednisone by pulmonology 7 days prior to admission. Treating for HCAP/LLL consolidation on chest x-ray. Advanced CHF team saw and signed off.   Assessment & Plan:   Principal Problem:   HCAP (healthcare-associated pneumonia) Active Problems:   Essential hypertension   Coronary atherosclerosis   ATRIAL FIBRILLATION, PAROXYSMAL   Pulmonary emphysema (HCC)   Chronic systolic heart failure (HCC)   Chronic kidney disease (CKD), stage III (moderate)   Hypokalemia   Pressure injury of skin   End stage congestive heart failure ([place])   1. Healthcare associated pneumonia/left lower lobe: Chest x-ray 7/23 shows left lower lobe bronchopneumonia. Blood cultures 2: Negative to date. Day 3 IV cefepime and vancomycin. Pro-calcitonin <0.1 is reassuring. Consider transitioning to oral antibiotics on 7/26. Needs follow-up chest x-ray in 4 weeks to ensure resolution of pneumonia findings. 2. Chronic systolic CHF/ICM/EF 76%/L/Y CRT-D: Continue milrinone 0.375 MCG. As per cardiology input, not candidate for advanced therapies due to age and comorbidities, has not tolerated ARB in the past due to hypotension, no beta blockers due to low output. Discussed with Dr. Aundra Dubin, Cardiology on 7/25: Temporarily hold Lasix today due to increased creatinine. 3. PAF/BiV pacing: Had DC CV in 2014. No A. fib by recent device check. Continue amiodarone 200 MG daily and Eliquis 0.5 MG twice a  day. 4. CAD: Asymptomatic of chest pain. No aspirin due to Eliquis 5. COPD exacerbation: Precipitated by pneumonia. Bronchospasm is clinically resolved. Taper steroids. 6. Hypokalemia: Replaced. 7. GERD: Continue PPI. 8. Acute on stage III chronic kidney disease: Creatinine in 2018 has fluctuated between 1.3 and 1.6. 1.6 may be his most recent baseline. Creatinine has gone up from 1.5-1.89. Discussed with cardiology, temporarily held Lasix. Follow BMP in a.m. 9. Anxiety/insomnia: Continue home dose of when necessary Xanax. 10. Wounds: Left buttock stage II pressure injury and 3 areas of DTI in the pack per WOC RN on sedation on 7/24. 11. Adult failure to thrive: As per cardiology, poor prognosis but their discussion with patient and family in June 2018, they wished to defer palliative care discussion.   DVT prophylaxis: On Eliquis Code Status: Full Family Communication: Discussed in detail with patient's son at bedside. Updated care and answered questions. Disposition: Requests PT evaluation. Possible discharge in the next 24-48 hours.   Consultants:  Cardiology   Procedures:  None  Antimicrobials:  IV cefepime and vancomycin    Subjective: Overnight events noted. Issues with anxiety and insomnia. States that he was awake all night and finally went to bed at 4:30 this morning. Occasional mild dyspnea. No chest pain. Cough improved, mild intermittent and may be nonproductive.   ROS: No fever or chills reported.  Objective:  Vitals:   10/10/16 0636 10/10/16 0730 10/10/16 1201 10/10/16 1618  BP: 115/70  108/63 114/66  Pulse: 72  78 73  Resp: 18  18 16   Temp: 97.7 F (36.5 C)  (!) 97.5 F (36.4 C) (!) 97.5 F (36.4  C)  TempSrc: Oral  Oral Oral  SpO2: 100%  91% 91%  Weight:  64.1 kg (141 lb 4.8 oz)    Height:        Examination:  General exam: Pleasant elderly male, frail and chronically ill looking, lying comfortably propped up in bed. Does not appear in any  distress. Respiratory system: Slightly diminished breath sounds in the bases but otherwise clear to auscultation. Respiratory effort normal. Cardiovascular system: S1 & S2 heard, RRR. No JVD, murmurs, rubs, gallops or clicks. No pedal edema. Telemetry: BiV paced rhythm. Gastrointestinal system: Abdomen is nondistended, soft and nontender. No organomegaly or masses felt. Normal bowel sounds heard. Central nervous system: Alert and oriented. No focal neurological deficits. Extremities: Symmetric 5 x 5 power. Skin: No rashes, lesions or ulcers Psychiatry: Judgement and insight appear normal. Mood & affect: Slightly anxious .     Data Reviewed: I have personally reviewed following labs and imaging studies  CBC:  Recent Labs Lab 10/08/16 1700 10/10/16 0500  WBC 7.6 10.2  HGB 14.8 13.0  HCT 43.4 39.7  MCV 90.6 90.6  PLT 163 338   Basic Metabolic Panel:  Recent Labs Lab 10/08/16 1700 10/09/16 0426 10/10/16 0500  NA 134* 135 135  K 3.1* 3.7 4.9  CL 93* 95* 96*  CO2 31 32 30  GLUCOSE 159* 186* 166*  BUN 51* 52* 60*  CREATININE 1.63* 1.50* 1.89*  CALCIUM 9.3 8.9 9.2   Coagulation Profile:  Recent Labs Lab 10/08/16 2223  INR 1.53     Recent Results (from the past 240 hour(s))  Culture, blood (Routine x 2)     Status: None (Preliminary result)   Collection Time: 10/08/16  5:15 PM  Result Value Ref Range Status   Specimen Description BLOOD RIGHT FOREARM  Final   Special Requests   Final    BOTTLES DRAWN AEROBIC ONLY Blood Culture adequate volume   Culture NO GROWTH 2 DAYS  Final   Report Status PENDING  Incomplete  Culture, blood (Routine x 2)     Status: None (Preliminary result)   Collection Time: 10/08/16  7:50 PM  Result Value Ref Range Status   Specimen Description BLOOD LEFT ANTECUBITAL  Final   Special Requests   Final    BOTTLES DRAWN AEROBIC AND ANAEROBIC Blood Culture adequate volume   Culture NO GROWTH 2 DAYS  Final   Report Status PENDING  Incomplete          Radiology Studies: Dg Chest 2 View  Result Date: 10/08/2016 CLINICAL DATA:  83 year old with 2-3 week history of intermittent cough and shortness of breath. Patient recently completed a course of antibiotic therapy. Current history of abdominal aortic aneurysm, atrial fibrillation, CHF and coronary artery disease. Indwelling biventricular pacing defibrillator. EXAM: CHEST  2 VIEW COMPARISON:  09/18/2016, 05/22/2016 and earlier. FINDINGS: Cardiac silhouette markedly enlarged, unchanged. Thoracic aorta atherosclerotic, unchanged. Hilar and mediastinal contours otherwise unremarkable. Left subclavian biventricular pacing defibrillator unchanged. Right jugular central venous catheter tip in the upper right atrium, unchanged. Stable hyperinflation and mild central peribronchial thickening. Interval development of streaky and patchy airspace opacities in the left lower lobe. Stable linear scarring in the right middle lobe. Mild pulmonary venous hypertension without overt edema. Stable small bilateral pleural effusions since the examination 3 weeks ago. IMPRESSION: 1. Left lower lobe bronchopneumonia superimposed upon COPD/emphysema. 2. Stable marked cardiomegaly. Pulmonary venous hypertension without overt pulmonary edema. 3. Stable small bilateral pleural effusions since the examination 3 weeks ago. 4.  Aortic Atherosclerosis (  ICD10-170.0) 5.  Emphysema. ([id number]-M01.9) Electronically Signed   By: Evangeline Dakin M.D.   On: 10/08/2016 18:45        Scheduled Meds: . amiodarone  200 mg Oral Daily  . apixaban  2.5 mg Oral BID  . atorvastatin  20 mg Oral q1800  . calcium carbonate  1 tablet Oral Q breakfast  . digoxin  0.0625 mg Oral Daily  . [START ON 10/11/2016] furosemide  80 mg Oral BID  . levothyroxine  112 mcg Oral Once per day on Mon Tue Wed Thu Fri Sat  . loratadine  10 mg Oral Daily  . magnesium oxide  400 mg Oral Daily  . multivitamin with minerals  1 tablet Oral Daily  . omega-3  acid ethyl esters  1 g Oral Daily  . pantoprazole  40 mg Oral Daily  . predniSONE  50 mg Oral Q breakfast  . sodium chloride flush  3 mL Intravenous Q12H  . umeclidinium-vilanterol  1 puff Inhalation Daily  . vitamin C  500 mg Oral Daily   Continuous Infusions: . sodium chloride    . ceFEPime (MAXIPIME) IV    . milrinone 0.375 mcg/kg/min (10/10/16 1027)  . vancomycin Stopped (10/09/16 2315)     LOS: 2 days     Neshia Mckenzie, MD, FACP, FHM. [facility] Pager [phone]  If 7PM-7AM, please contact night-coverage [email or web address] Password [facility] 10/10/2016, 4:54 PM

## 2016-10-10 NOTE — Progress Notes (Signed)
PT Cancellation Note  Patient Details Name: YONATHAN PERROW MRN: [id number] DOB: 09-Aug-1933   Cancelled Treatment:    Reason Eval/Treat Not Completed: Fatigue/lethargy limiting ability to participate.  Patient requested PT be deferred until tomorrow.   Despina Pole 10/10/2016, 8:11 PM Carita Pian. Sanjuana Kava, [place] Pager [phone]

## 2016-10-10 NOTE — Progress Notes (Signed)
Patient asleep. HR 74. Will continue to monitor.  Lonzie Simmer, RN

## 2016-10-10 NOTE — Progress Notes (Signed)
[facility] is following for HHRN/ home Milrinone; B Pennie Rushing [phone]

## 2016-10-10 NOTE — Progress Notes (Signed)
  Progress Note   Date: 10/10/2016  Patient Name: Trevor Simpson        MRN#: [id number]   Clarification of the diagnosis of pressure ulcer(s):   Pressure ulcer WAS present on admission Pressure ulcer Stage 2  Left buttock with stage 2 pressure injury  Irwin Brakeman, MD

## 2016-10-10 NOTE — Progress Notes (Signed)
Placed external urinary catheter per patient request.  Will continue to monitor.  Izzie Geers, RN

## 2016-10-10 NOTE — Progress Notes (Signed)
Patient stated that he is having severe panic attack. Son at bedside. PRN for anxiety and sleep given and not effective. VS stable for now. Paged NP for additional PRN. Will continue to monitor.  Brantly Kalman, RN

## 2016-10-11 DIAGNOSIS — J9601 Acute respiratory failure with hypoxia: Secondary | ICD-10-CM

## 2016-10-11 DIAGNOSIS — E875 Hyperkalemia: Secondary | ICD-10-CM

## 2016-10-11 LAB — BASIC METABOLIC PANEL
ANION GAP: 12 (ref 5–15)
ANION GAP: 10 (ref 5–15)
BUN: 63 mg/dL — ABNORMAL HIGH (ref 6–20)
BUN: 64 mg/dL — AB (ref 6–20)
CHLORIDE: 94 mmol/L — AB (ref 101–111)
CHLORIDE: 93 mmol/L — AB (ref 101–111)
CO2: 28 mmol/L (ref 22–32)
CO2: 31 mmol/L (ref 22–32)
Calcium: 9.6 mg/dL (ref 8.9–10.3)
Calcium: 9.4 mg/dL (ref 8.9–10.3)
Creatinine, Ser: 1.89 mg/dL — ABNORMAL HIGH (ref 0.61–1.24)
Creatinine, Ser: 1.84 mg/dL — ABNORMAL HIGH (ref 0.61–1.24)
GFR calc Af Amer: 38 mL/min — ABNORMAL LOW (ref >60)
GFR, EST AFRICAN AMERICAN: 37 mL/min — AB (ref >60)
GFR, EST NON AFRICAN AMERICAN: 32 mL/min — AB (ref >60)
GFR, EST NON AFRICAN AMERICAN: 33 mL/min — AB (ref >60)
Glucose, Bld: 203 mg/dL — ABNORMAL HIGH (ref 65–99)
Glucose, Bld: 163 mg/dL — ABNORMAL HIGH (ref 65–99)
POTASSIUM: 5.3 mmol/L — AB (ref 3.5–5.1)
POTASSIUM: 5.2 mmol/L — AB (ref 3.5–5.1)
SODIUM: 134 mmol/L — AB (ref 135–145)
SODIUM: 134 mmol/L — AB (ref 135–145)

## 2016-10-11 LAB — COOXEMETRY PANEL
CARBOXYHEMOGLOBIN: 1.5 % (ref 0.5–1.5)
Methemoglobin: 0.8 % (ref 0.0–1.5)
O2 SAT: 52.9 %
TOTAL HEMOGLOBIN: 13.6 g/dL (ref 12.0–16.0)

## 2016-10-11 MED ORDER — SODIUM POLYSTYRENE SULFONATE 15 GM/60ML PO SUSP
15.0000 g | Freq: Once | ORAL | Status: AC
  Administered 2016-10-11: 15 g via ORAL
  Filled 2016-10-11: qty 60

## 2016-10-11 MED ORDER — AMOXICILLIN-POT CLAVULANATE 500-125 MG PO TABS
1.0000 | ORAL_TABLET | Freq: Two times a day (BID) | ORAL | Status: DC
  Administered 2016-10-11 – 2016-10-12 (×3): 500 mg via ORAL
  Filled 2016-10-11 (×4): qty 1

## 2016-10-11 MED ORDER — FUROSEMIDE 40 MG PO TABS
40.0000 mg | ORAL_TABLET | Freq: Once | ORAL | Status: AC
  Administered 2016-10-11: 40 mg via ORAL
  Filled 2016-10-11: qty 1

## 2016-10-11 NOTE — Progress Notes (Signed)
SATURATION QUALIFICATIONS: (This note is used to comply with regulatory documentation for home oxygen)  Patient Saturation on Room Air at rest = 80%  Patient Saturations on 2L at Rest = 90%  Patient Saturations on 3L while Ambulating = 92%    Please briefly explain why patient needs home oxygen:Pt requires supplemental oxygen at all times to maintain saturations >90% Trevor Simpson, Trevor Simpson

## 2016-10-11 NOTE — Progress Notes (Signed)
Weight and potassium up. Discussed with Dr. Aundra Dubin   Will resume po diuretics. Agree with watching until tomorrow.     Trevor Simpson [address] [place], PA-C 10/11/2016 4:29 PM

## 2016-10-11 NOTE — Progress Notes (Signed)
Room air sat 82%, raised HOB, and placed on 2L nasal cannula, sats increased to 93%

## 2016-10-11 NOTE — Progress Notes (Signed)
Called lab again regarding STAT BMP order that has now been canceled. Order was placed STAT at 0656.  Order has been reordered and IV team has been consulted STAT for this lab draw.

## 2016-10-11 NOTE — Evaluation (Signed)
Physical Therapy Evaluation Patient Details Name: Trevor Simpson MRN: [id number] DOB: [birth year] Today's Date: 10/11/2016   History of Present Illness  83 yo admitted with HCAP. PMhx: CVA, ICM, COPD, CABG, CHF  Clinical Impression  Upon arrival pt in bed and son present in room. Pt seems lethargic; however becomes more alert one beginning to mobilize with PT. Pt A & O x 4 and able to ambulate in hallway with RW. However, pt with significant flexed posture even after VCs and reports "I'm just not as tall as I used to be." Pt became fatigued fairly quickly; however O2 sat remained above 90% on 3L O2 during ambulation. Pt presents with deficits listed in PT problem list below and will benefit from continued acute therapy for mobilization and increased activity tolerance for safe d/c. Son explains that pt will have 24 hr supervision and assistance at home. Feel HHPT will be appropriate at this time. At end of session, educated son and pt on need for continued O2 at home  ( 2 L at rest and 3 L during mobility).   Vitals:   Pre ambulation SpO2 96% on 2L O2 HR 78 During ambulation SpO2 91% on 3L O2 HR 85 At rest, SpO2 below 90% without 2L O2      Follow Up Recommendations Home health PT;Supervision/Assistance - 24 hour    Equipment Recommendations  3in1 (PT)    Recommendations for Other Services OT consult     Precautions / Restrictions Precautions Precautions: Fall Precaution Comments: watch sats      Mobility  Bed Mobility Overal bed mobility: Needs Assistance Bed Mobility: Supine to Sit     Supine to sit: Min assist     General bed mobility comments: Min A for bringing LEs off bed and scooting bottom to EOB  Transfers Overall transfer level: Needs assistance   Transfers: Sit to/from Stand Sit to Stand: Min guard         General transfer comment: cues for hand placement  Ambulation/Gait Ambulation/Gait assistance: Min guard Ambulation Distance (Feet): 70  Feet Assistive device: Rolling walker (2 wheeled) Gait Pattern/deviations: Shuffle;Trunk flexed Gait velocity: slow Gait velocity interpretation: Below normal speed for age/gender General Gait Details: cues for posture and position in RW. Pt with fixed kyphosis and shuffling gait. pt maintains head down and shuffling gait despite cues for increased stride  Stairs            Wheelchair Mobility    Modified Rankin (Stroke Patients Only)       Balance Overall balance assessment: Needs assistance   Sitting balance-Leahy Scale: Good     Standing balance support: Bilateral upper extremity supported;During functional activity Standing balance-Leahy Scale: Poor Standing balance comment: no falls in last year per pt and family; however currently relies on RW for Bil UE support during ambulation                              Pertinent Vitals/Pain Pain Assessment: No/denies pain    Home Living Family/patient expects to be discharged to:: Private residence Living Arrangements: Spouse/significant other Available Help at Discharge: Family;Available 24 hours/day Type of Home: House Home Access: Stairs to enter Entrance Stairs-Rails: None Entrance Stairs-Number of Steps: 1 Home Layout: Two level;Able to live on main level with bedroom/bathroom;Laundry or work area in [place]: Kasandra Knudsen - single point;Walker - 4 wheels;Shower seat Additional Comments: was using a cane until recently, was sleeping in recliner  Prior Function Level of Independence: Independent with assistive device(s);Needs assistance   Gait / Transfers Assistance Needed: last few weeks use of RW  ADL's / Homemaking Assistance Needed: assist for bathing and dressing for a few weeks  Comments: prior to decline was mod I and going to the [facility] 5days/wk     Hand Dominance        Extremity/Trunk Assessment   Upper Extremity Assessment Upper Extremity Assessment: Generalized weakness     Lower Extremity Assessment Lower Extremity Assessment: Generalized weakness    Cervical / Trunk Assessment Cervical / Trunk Assessment: Kyphotic  Communication   Communication: HOH  Cognition Arousal/Alertness: Awake/alert Behavior During Therapy: Flat affect Overall Cognitive Status: Within Functional Limits for tasks assessed                                        General Comments      Exercises     Assessment/Plan    PT Assessment Patient needs continued PT services  PT Problem List Decreased activity tolerance;Decreased range of motion;Decreased knowledge of use of DME;Cardiopulmonary status limiting activity;Decreased strength;Decreased mobility       PT Treatment Interventions Gait training;Therapeutic exercise;Patient/family education;Stair training;Functional mobility training;DME instruction;Therapeutic activities    PT Goals (Current goals can be found in the Care Plan section)  Acute Rehab PT Goals Patient Stated Goal: gardening PT Goal Formulation: With patient Time For Goal Achievement: 10/25/16 Potential to Achieve Goals: Fair    Frequency Min 3X/week   Barriers to discharge   family can provide 24 hr care    Co-evaluation               AM-PAC PT "6 Clicks" Daily Activity  Outcome Measure Difficulty turning over in bed (including adjusting bedclothes, sheets and blankets)?: A Little Difficulty moving from lying on back to sitting on the side of the bed? : A Lot Difficulty sitting down on and standing up from a chair with arms (e.g., wheelchair, bedside commode, etc,.)?: A Little Help needed moving to and from a bed to chair (including a wheelchair)?: A Little Help needed walking in hospital room?: A Little Help needed climbing 3-5 steps with a railing? : A Little 6 Click Score: 17    End of Session Equipment Utilized During Treatment: Gait belt;Oxygen Activity Tolerance: Patient tolerated treatment well Patient left: in  chair;with chair alarm set;with family/visitor present Nurse Communication: Mobility status PT Visit Diagnosis: Other abnormalities of gait and mobility (R26.89);Difficulty in walking, not elsewhere classified (R26.2);Muscle weakness (generalized) (M62.81)    Time: 5809-9833 PT Time Calculation (min) (ACUTE ONLY): 31 min   Charges:   PT Evaluation $PT Eval Moderate Complexity: 1 Procedure PT Treatments $Gait Training: 8-22 mins   PT G Codes:       Elberta Leatherwood, SPT Acute Rehab [phone]  .  Elberta Leatherwood 10/11/2016, 9:01 AM

## 2016-10-11 NOTE — Progress Notes (Signed)
PROGRESS NOTE   Trevor Simpson  ZHG:[id number]    DOB: 26-Feb-1934    DOA: 10/08/2016  PCP: [name], MD   I have briefly reviewed patients previous medical records in [facility]  Brief Narrative:  83 year old male with PMH of COPD, symptomatic bradycardia, PAF, HTN, PAD, CVA, chronic systolic CHF due to ischemic cardiomyopathy (2-D echo 01/2016: EF 25%), s/p CRT-D 09/2013, has been on milrinone drip at home since December 2017, presented to ED with dyspnea and coughing up some blood. Had been started on oral doxycycline and prednisone by pulmonology 7 days prior to admission. Treating for HCAP/LLL consolidation on chest x-ray. Advanced CHF team saw and signed off.Current issues with mild acute on chronic kidney disease and mild hyperkalemia.   Assessment & Plan:   Principal Problem:   HCAP (healthcare-associated pneumonia) Active Problems:   Essential hypertension   Coronary atherosclerosis   ATRIAL FIBRILLATION, PAROXYSMAL   Pulmonary emphysema (HCC)   Chronic systolic heart failure (HCC)   Chronic kidney disease (CKD), stage III (moderate)   Hypokalemia   Pressure injury of skin   End stage congestive heart failure ([place])   1. Healthcare associated pneumonia/left lower lobe: Chest x-ray 7/23 shows left lower lobe bronchopneumonia. Blood cultures 2: Negative to date. Completed 3 days of IV cefepime and vancomycin. Pro-calcitonin <0.1 is reassuring. Transitioned to oral Augmentin to complete total 8 days treatment. Needs follow-up chest x-ray in 4 weeks to ensure resolution of pneumonia findings. Urine pneumococcal antigen negative. 2. Chronic systolic CHF/ICM/EF 19%/Q/Q CRT-D: Continue milrinone 0.375 MCG. As per cardiology input, not candidate for advanced therapies due to age and comorbidities, has not tolerated ARB in the past due to hypotension, no beta blockers due to low output. Volume status looks good. Creatinine remains elevated at 1.8 without change. Continue to hold  Lasix for another day and consider starting patient prior home dose tomorrow. Encouraged oral fluid intake. 3. Acute hypoxic respiratory failure: Secondary to pneumonia, CHF, COPD. Will need home oxygen at discharge. 4. PAF/BiV pacing: Had DC CV in 2014. No A. fib by recent device check. Continue amiodarone 200 MG daily and Eliquis 2.5 MG twice a day. 5. CAD: Asymptomatic of chest pain. No aspirin due to Eliquis 6. COPD exacerbation: Precipitated by pneumonia. Bronchospasm has clinically resolved. Tapering steroids. 7. Hypokalemia/hyperkalemia: Noted hyperkalemia/potassium 5.3 on 7/26. May be related to acute kidney injury. Low potassium diet. Discontinued Lasix for today. Encouraged oral fluid intake. A dose of Kayexalate 15 g 1. Follow BMP in a.m. 8. GERD: Continue PPI. 9. Acute on stage III chronic kidney disease: Creatinine in 2018 has fluctuated between 1.3 and 1.6. 1.6 may be his most recent baseline. Creatinine has gone up from 1.5-1.89. Creatinine stable in the 1.8 range. Management as above.. Follow BMP in a.m. 10. Anxiety/insomnia: Continue home dose of when necessary Xanax-as discussed with pharmacy on 7/25. Able to sleep well last night. Stable. 11. Wounds: Left buttock stage II pressure injury and 3 areas of DTI in the pack per WOC RN on sedation on 7/24. 12. Adult failure to thrive: As per cardiology, poor prognosis but their discussion with patient and family in June 2018, they wished to defer palliative care discussion.   DVT prophylaxis: On Eliquis Code Status: Full Family Communication: Discussed in detail with patient's son at bedside. Updated care and answered questions. Disposition: Possible discharge in the next 24 hours. Patient will DC home with PT and has 24-hour supervision.   Consultants:  Cardiology   Procedures:  None  Antimicrobials:  IV cefepime and vancomycin -discontinued. Oral Augmentin 7/26 >.   Subjective: Seen this morning. Was working with PT.  States that he felt "okay". Slept well last night as per son's report. No chest pain, dyspnea or cough reported.  ROS: No fever or chills reported.  Objective:  Vitals:   10/11/16 0751 10/11/16 0757 10/11/16 0810 10/11/16 1032  BP: 117/63     Pulse: 74  83 74  Resp: 18     Temp:      TempSrc:      SpO2: 96% 90% 91%   Weight:      Height:        Examination:  General exam: Pleasant elderly male, frail and chronically ill looking, seen ambulating with the help of a walker and assistance from PT team. Respiratory system: Slightly diminished breath sounds in the bases but otherwise clear to auscultation. Respiratory effort normal. No change. Cardiovascular system: S1 & S2 heard, RRR. No JVD, murmurs, rubs, gallops or clicks. No pedal edema. Telemetry: BiV paced rhythm. Stable without change. Gastrointestinal system: Abdomen is nondistended, soft and nontender. No organomegaly or masses felt. Normal bowel sounds heard. Central nervous system: Alert and oriented. No focal neurological deficits. Extremities: Symmetric 5 x 5 power. Skin: No rashes, lesions or ulcers Psychiatry: Judgement and insight appear normal. Mood & affect: Pleasant and appropriate. He is in good spirits today.    Data Reviewed: I have personally reviewed following labs and imaging studies  CBC:  Recent Labs Lab 10/08/16 1700 10/10/16 0500  WBC 7.6 10.2  HGB 14.8 13.0  HCT 43.4 39.7  MCV 90.6 90.6  PLT 163 086   Basic Metabolic Panel:  Recent Labs Lab 10/08/16 1700 10/09/16 0426 10/10/16 0500 10/11/16 0945  NA 134* 135 135 134*  K 3.1* 3.7 4.9 5.3*  CL 93* 95* 96* 94*  CO2 31 32 30 28  GLUCOSE 159* 186* 166* 203*  BUN 51* 52* 60* 63*  CREATININE 1.63* 1.50* 1.89* 1.89*  CALCIUM 9.3 8.9 9.2 9.6   Coagulation Profile:  Recent Labs Lab 10/08/16 2223  INR 1.53     Recent Results (from the past 240 hour(s))  Culture, blood (Routine x 2)     Status: None (Preliminary result)    Collection Time: 10/08/16  5:15 PM  Result Value Ref Range Status   Specimen Description BLOOD RIGHT FOREARM  Final   Special Requests   Final    BOTTLES DRAWN AEROBIC ONLY Blood Culture adequate volume   Culture NO GROWTH 3 DAYS  Final   Report Status PENDING  Incomplete  Culture, blood (Routine x 2)     Status: None (Preliminary result)   Collection Time: 10/08/16  7:50 PM  Result Value Ref Range Status   Specimen Description BLOOD LEFT ANTECUBITAL  Final   Special Requests   Final    BOTTLES DRAWN AEROBIC AND ANAEROBIC Blood Culture adequate volume   Culture NO GROWTH 3 DAYS  Final   Report Status PENDING  Incomplete         Radiology Studies: No results found.      Scheduled Meds: . ALPRAZolam  0.125 mg Oral TID WC  . ALPRAZolam  0.375 mg Oral QHS  . amiodarone  200 mg Oral Daily  . amoxicillin-clavulanate  1 tablet Oral BID  . apixaban  2.5 mg Oral BID  . atorvastatin  20 mg Oral q1800  . calcium carbonate  1 tablet Oral Q breakfast  . digoxin  0.0625 mg Oral Daily  . levothyroxine  112 mcg Oral Once per day on Mon Tue Wed Thu Fri Sat  . loratadine  10 mg Oral Daily  . magnesium oxide  400 mg Oral Daily  . multivitamin with minerals  1 tablet Oral Daily  . omega-3 acid ethyl esters  1 g Oral Daily  . pantoprazole  40 mg Oral Daily  . predniSONE  30 mg Oral Q breakfast  . sodium chloride flush  3 mL Intravenous Q12H  . umeclidinium-vilanterol  1 puff Inhalation Daily  . vitamin C  500 mg Oral Daily   Continuous Infusions: . sodium chloride    . milrinone 0.375 mcg/kg/min (10/11/16 1329)     LOS: 3 days     Kaleah Hagemeister, MD, FACP, FHM. [facility] Pager [phone]  If 7PM-7AM, please contact night-coverage [email or web address] Password [email or web address] 10/11/2016, 1:39 PM

## 2016-10-11 NOTE — Progress Notes (Signed)
[facility]  Active pt with [facility].  We are prepared for DC home later today.   [facility] HHRN will connect pt to Milrinone and pump for DC to home.  If patient discharges after hours, please call [phone].   Larry Sierras 10/11/2016, 7:45 AM

## 2016-10-12 ENCOUNTER — Encounter (HOSPITAL_COMMUNITY): Payer: Self-pay

## 2016-10-12 LAB — BASIC METABOLIC PANEL
ANION GAP: 9 (ref 5–15)
BUN: 65 mg/dL — ABNORMAL HIGH (ref 6–20)
CALCIUM: 8.6 mg/dL — AB (ref 8.9–10.3)
CO2: 31 mmol/L (ref 22–32)
Chloride: 97 mmol/L — ABNORMAL LOW (ref 101–111)
Creatinine, Ser: 1.8 mg/dL — ABNORMAL HIGH (ref 0.61–1.24)
GFR, EST AFRICAN AMERICAN: 39 mL/min — AB (ref >60)
GFR, EST NON AFRICAN AMERICAN: 34 mL/min — AB (ref >60)
GLUCOSE: 118 mg/dL — AB (ref 65–99)
POTASSIUM: 4.2 mmol/L (ref 3.5–5.1)
Sodium: 137 mmol/L (ref 135–145)

## 2016-10-12 LAB — COOXEMETRY PANEL
Carboxyhemoglobin: 1.5 % (ref 0.5–1.5)
METHEMOGLOBIN: 0.8 % (ref 0.0–1.5)
O2 Saturation: 58.9 %
Total hemoglobin: 14.5 g/dL (ref 12.0–16.0)

## 2016-10-12 LAB — PROCALCITONIN: PROCALCITONIN: 0.14 ng/mL

## 2016-10-12 MED ORDER — PREDNISONE 10 MG PO TABS
ORAL_TABLET | ORAL | 0 refills | Status: DC
Start: 2016-10-13 — End: 2016-11-01

## 2016-10-12 MED ORDER — PREDNISONE 10 MG PO TABS
30.0000 mg | ORAL_TABLET | Freq: Every day | ORAL | Status: DC
  Administered 2016-10-12: 30 mg via ORAL
  Filled 2016-10-12: qty 1

## 2016-10-12 MED ORDER — FUROSEMIDE 40 MG PO TABS
40.0000 mg | ORAL_TABLET | Freq: Every day | ORAL | Status: DC
  Administered 2016-10-12: 40 mg via ORAL
  Filled 2016-10-12: qty 1

## 2016-10-12 MED ORDER — FUROSEMIDE 80 MG PO TABS: 80.0000 mg | ORAL_TABLET | Freq: Every day | ORAL | Status: DC

## 2016-10-12 MED ORDER — AMOXICILLIN-POT CLAVULANATE 500-125 MG PO TABS: 1.0000 | ORAL_TABLET | Freq: Two times a day (BID) | ORAL | 0 refills | Status: DC

## 2016-10-12 MED ORDER — ALPRAZOLAM 0.25 MG PO TABS
0.1250 mg | ORAL_TABLET | Freq: Three times a day (TID) | ORAL | Status: DC
  Administered 2016-10-12 (×2): 0.125 mg via ORAL
  Filled 2016-10-12 (×2): qty 1

## 2016-10-12 MED ORDER — POTASSIUM CHLORIDE CRYS ER 20 MEQ PO TBCR: 20.0000 meq | EXTENDED_RELEASE_TABLET | Freq: Two times a day (BID) | ORAL | Status: AC

## 2016-10-12 NOTE — Progress Notes (Addendum)
Joelene Millin with [facility] called for possible discharge home today/ resumption of home IV Milrinone; B Lemya Greenwell RN,MHA,BSN [phone]  1:00 pm - home oxygen and 3:1 ordered and requested and to be delivered to the room today prior to discharging home; B IT sales professional

## 2016-10-12 NOTE — Discharge Summary (Signed)
Physician Discharge Summary  Trevor Simpson FBP:[id number] DOB: November 05, 1933  PCP: [name], MD  Admit date: 10/08/2016 Discharge date: 10/12/2016  Recommendations for Outpatient Follow-up:  1. Dr. [name], PCP in 5 days.Recommend follow-up chest x-ray in 4 weeks to ensure resolution of pneumonia findings. Please follow final blood culture results that were sent from the hospital. 2. Dr. Glori Bickers, [facility] on 11/01/16 at 2:20 PM. The team has arranged for outpatient labs on 10/16/16 through home health RN.  Home Health: PT and RN Equipment/Devices: 3 in 1 . Oxygen via nasal cannula at 2 L/m continuously.  Discharge Condition: Improved and stable  CODE STATUS: Full  Diet recommendation: Heart healthy diet.  Discharge Diagnoses:  Principal Problem:   HCAP (healthcare-associated pneumonia) Active Problems:   Essential hypertension   Coronary atherosclerosis   ATRIAL FIBRILLATION, PAROXYSMAL   Pulmonary emphysema (HCC)   Chronic systolic heart failure (HCC)   Chronic kidney disease (CKD), stage III (moderate)   Hypokalemia   Pressure injury of skin   End stage congestive heart failure [facility])   Brief Summary: 83 year old male with PMH of COPD, symptomatic bradycardia, PAF, HTN, PAD, CVA, chronic systolic CHF due to ischemic cardiomyopathy (2-D echo 01/2016: EF 25%), s/p CRT-D 09/2013, has been on milrinone drip at home since December 2017, presented to ED with dyspnea and coughing up some blood. Had been started on oral doxycycline and prednisone by pulmonology 7 days prior to admission. Treating for HCAP/LLL consolidation on chest x-ray. Advanced CHF team saw and signed off.    Assessment & Plan:   1. Healthcare associated pneumonia/left lower lobe: Chest x-ray 7/23 shows left lower lobe bronchopneumonia. Blood cultures 2: Negative to date. Completed 3 days of IV cefepime and vancomycin. Pro-calcitonin <0.1 is reassuring. Transitioned to oral Augmentin  to complete total 8 days treatment. Needs follow-up chest x-ray in 4 weeks to ensure resolution of pneumonia findings. Urine pneumococcal antigen negative. 2. Chronic systolic CHF/ICM/EF 82%/U/M CRT-D: Continue milrinone 0.375 MCG. As per cardiology input, not candidate for advanced therapies due to age and comorbidities, has not tolerated ARB in the past due to hypotension, no beta blockers due to low output. Volume status looks good. Creatinine remains elevated at 1.8 & stable. Discussed in detail with advanced heart failure team who recommended resuming home dose of Lasix, reduced dose of potassium, home dose of milrinone, digoxin. They have arranged for advanced Homecare to drop labs/BMP Tuesday of next week and will follow patient in the office. 3. Acute hypoxic respiratory failure: Secondary to pneumonia, CHF, COPD. Remains hypoxic with activity. Home oxygen arranged. 4. PAF/BiV pacing: Had DC CV in 2014. No A. fib by recent device check. Continue amiodarone 200 MG daily and Eliquis 2.5 MG twice a day. 5. CAD: Asymptomatic of chest pain. No aspirin due to Eliquis 6. COPD exacerbation: Precipitated by pneumonia. Bronchospasm has clinically resolved. Tapering steroids. 7. Hypokalemia/hyperkalemia: Noted hyperkalemia/potassium 5.3 on 7/26. May be related to acute kidney injury. Low potassium diet. Briefly held Lasix. Encouraged oral fluid intake. A dose of Kayexalate 15 g 1. Hyperkalemia has resolved. Since patient is being resumed on home high-dose of Lasix, potassium will be resumed but at lower dose. Follow BMP closely next week by advanced heart failure team. 8. GERD: Continue PPI. 9. Acute on stage III chronic kidney disease: Creatinine in 2018 has fluctuated between 1.3 and 1.6. 1.6 may be his most recent baseline. Creatinine has gone up from 1.5-1.89. Creatinine stable in the 1.8 range. Management as  above.. Follow BMP in a few days 10. Anxiety/insomnia: Continue home dose of when necessary  Xanax-as discussed with pharmacy on 7/25. Able to sleep well last night. Stable. 11. Wounds: Left buttock stage II pressure injury and 3 areas of DTI in the pack per [facility] RN consultation on 7/24. 12. Adult failure to thrive: As per cardiology, poor prognosis but their discussion with patient and family in June 2018, they wished to defer palliative care discussion. 13. Microscopic hematuria: Unclear etiology. No pain reported. On anticoagulation. Outpatient follow-up and evaluation as deemed necessary.   Consultants:  Cardiology   Procedures:  None   Discharge Instructions  Discharge Instructions    (HEART FAILURE PATIENTS) Call MD:  Anytime you have any of the following symptoms: 1) 3 pound weight gain in 24 hours or 5 pounds in 1 week 2) shortness of breath, with or without a dry hacking cough 3) swelling in the hands, feet or stomach 4) if you have to sleep on extra pillows at night in order to breathe.    Complete by:  As directed    Call MD for:  difficulty breathing, headache or visual disturbances    Complete by:  As directed    Call MD for:  extreme fatigue    Complete by:  As directed    Call MD for:  persistant dizziness or light-headedness    Complete by:  As directed    Call MD for:  severe uncontrolled pain    Complete by:  As directed    Call MD for:  temperature >100.4    Complete by:  As directed    Diet - low sodium heart healthy    Complete by:  As directed    Increase activity slowly    Complete by:  As directed        Medication List    STOP taking these medications   doxycycline 100 MG tablet Commonly known as:  VIBRA-TABS     TAKE these medications   ALPRAZolam 0.25 MG tablet Commonly known as:  XANAX Take 0.125-0.375 mg by mouth See admin instructions. Take 0.125 mg by mouth every morning, lunch and dinner Take 0.375 mg at bedtime.   amiodarone 200 MG tablet Commonly known as:  PACERONE Take 1 tablet (200 mg total) by mouth daily. What  changed:  when to take this   amoxicillin-clavulanate 500-125 MG tablet Commonly known as:  AUGMENTIN Take 1 tablet (500 mg total) by mouth 2 (two) times daily.   apixaban 2.5 MG Tabs tablet Commonly known as:  ELIQUIS Take 1 tablet (2.5 mg total) by mouth 2 (two) times daily.   atorvastatin 20 MG tablet Commonly known as:  LIPITOR Take 20 mg by mouth daily at 6 PM.   calcium carbonate 600 MG Tabs tablet Commonly known as:  OS-CAL Take 600 mg by mouth daily with breakfast.   cetirizine 10 MG tablet Commonly known as:  ZYRTEC Take 10 mg by mouth daily.   digoxin 0.125 MG tablet Commonly known as:  LANOXIN Take 0.5 tablets (0.0625 mg total) by mouth daily.   doxylamine (Sleep) 25 MG tablet Commonly known as:  UNISOM Take 25 mg by mouth at bedtime.   Fish Oil 1200 MG Caps Take 1,200 mg by mouth daily.   fluticasone 50 MCG/ACT nasal spray Commonly known as:  FLONASE Place 1 spray into both nostrils daily as needed for allergies. Reported on 04/11/2015   furosemide 80 MG tablet Commonly known as:  LASIX Take 40-80  mg by mouth. 40 mg in the morning and 80 mg in the evening   levothyroxine 112 MCG tablet Commonly known as:  SYNTHROID, LEVOTHROID Take 112 mcg by mouth See admin instructions. Pt takes Monday- Saturday - skips on Sundays   magnesium gluconate 500 MG tablet Commonly known as:  MAGONATE Take 500 mg by mouth daily.   milrinone 20 MG/100 ML Soln infusion Commonly known as:  PRIMACOR Inject 26.775 mcg/min into the vein continuous.   multivitamin with minerals Tabs tablet Take 1 tablet by mouth daily.   nitroGLYCERIN 0.4 MG SL tablet Commonly known as:  NITROSTAT Place 1 tablet (0.4 mg total) under the tongue every 5 (five) minutes as needed for chest pain.   omeprazole 20 MG capsule Commonly known as:  PRILOSEC Take 20 mg by mouth daily as needed (indigestion).   potassium chloride SA 20 MEQ tablet Commonly known as:  KLOR-CON M20 Take 1 tablet (20  mEq total) by mouth 2 (two) times daily. What changed:  how much to take   predniSONE 10 MG tablet Commonly known as:  DELTASONE 2 tablets daily for 2 days, then 1 tablet daily for 2 days, then stop. What changed:  additional instructions   SAW PALMETTO PO Take 1 capsule by mouth 2 (two) times daily.   senna 8.6 MG Tabs tablet Commonly known as:  SENOKOT Take 1 tablet by mouth daily as needed for mild constipation.   SUPER B COMPLEX Tabs Take 1 tablet by mouth daily.   umeclidinium-vilanterol 62.5-25 MCG/INH Aepb Commonly known as:  ANORO ELLIPTA Inhale 1 puff into the lungs daily.   vitamin C 500 MG tablet Commonly known as:  ASCORBIC ACID Take 500 mg by mouth daily.      Follow-up Information    Bensimhon, Shaune Pascal, MD Follow up on 11/01/2016.   Specialty:  Cardiology Why:  at 220 pm for post hospital follow up. Please bring all of your medications to your visit. The code for parking is [id number]. Contact information: [address] [place] [address] [phone]        [name], MD. Schedule an appointment as soon as possible for a visit in 5 days.   Specialty:  Internal Medicine Why:  Please call Monday to schedule hospital follow up appointment Contact information: [address] [place] [address] [phone]          Allergies  Allergen Reactions  . Codeine     "tripped out on it"  . Morphine     "tripped out on it"      Procedures/Studies: Dg Chest 2 View  Result Date: 10/08/2016 CLINICAL DATA:  83 year old with 2-3 week history of intermittent cough and shortness of breath. Patient recently completed a course of antibiotic therapy. Current history of abdominal aortic aneurysm, atrial fibrillation, CHF and coronary artery disease. Indwelling biventricular pacing defibrillator. EXAM: CHEST  2 VIEW COMPARISON:  09/18/2016, 05/22/2016 and earlier. FINDINGS: Cardiac silhouette markedly enlarged, unchanged. Thoracic  aorta atherosclerotic, unchanged. Hilar and mediastinal contours otherwise unremarkable. Left subclavian biventricular pacing defibrillator unchanged. Right jugular central venous catheter tip in the upper right atrium, unchanged. Stable hyperinflation and mild central peribronchial thickening. Interval development of streaky and patchy airspace opacities in the left lower lobe. Stable linear scarring in the right middle lobe. Mild pulmonary venous hypertension without overt edema. Stable small bilateral pleural effusions since the examination 3 weeks ago. IMPRESSION: 1. Left lower lobe bronchopneumonia superimposed upon COPD/emphysema. 2. Stable marked cardiomegaly.  Pulmonary venous hypertension without overt pulmonary edema. 3. Stable small bilateral pleural effusions since the examination 3 weeks ago. 4.  Aortic Atherosclerosis (ICD10-170.0) 5.  Emphysema. (TDV76-H60.9) Electronically Signed   By: Evangeline Dakin M.D.   On: 10/08/2016 18:45   Dg Chest 2 View  Result Date: 09/18/2016 CLINICAL DATA:  Worsening shortness of breath, weakness, history hypertension, coronary artery disease post MI, systolic CHF, ischemic cardiomyopathy, atrial fibrillation, stroke EXAM: CHEST  2 VIEW COMPARISON:  05/22/2016 FINDINGS: RIGHT jugular central venous catheter with tip projecting over RIGHT atrium. LEFT subclavian AICD leads project at RIGHT atrium, RIGHT ventricle and coronary sinus. Enlargement of cardiac silhouette. Calcification and elongation of thoracic aorta. Pulmonary vascularity normal. Emphysematous and bronchitic changes consistent with COPD. Atelectasis at RIGHT base. Questionable mild interstitial infiltrates greater on RIGHT. Small bibasilar effusions. No pneumothorax. Questionable calcified granuloma lower LEFT lung. Bones demineralized. IMPRESSION: COPD changes with small bibasilar effusions, RIGHT basilar atelectasis and question mild interstitial infiltrate/edema. Aortic Atherosclerosis (ICD10-I70.0)  and Emphysema (ICD10-J43.9). Electronically Signed   By: Lavonia Dana M.D.   On: 09/18/2016 13:45      Subjective: Seen this morning. States that he is doing well. No dyspnea, chest pain, dizziness or lightheadedness reported. Eager to return home soon. No cough, fever or chills.  Discharge Exam:  Vitals:   10/11/16 2131 10/12/16 0350 10/12/16 0814 10/12/16 1238  BP: (!) 115/56 (!) 112/58  120/64  Pulse: 74 75  73  Resp: 18 18  18   Temp: 97.6 F (36.4 C) 97.7 F (36.5 C)  97.8 F (36.6 C)  TempSrc: Oral Oral  Oral  SpO2: 97% 97% 98% 94%  Weight:  67.7 kg (149 lb 3.2 oz)    Height:        General exam: Pleasant elderly male, frail and chronically ill looking, seen sitting comfortably in chair. Respiratory system: clear to auscultation. Respiratory effort normal. No change. Cardiovascular system: S1 & S2 heard, RRR. No JVD, murmurs, rubs, gallops or clicks. 1+ b/l ankle edema. Telemetry: BiV paced rhythm.  Gastrointestinal system: Abdomen is nondistended, soft and nontender. No organomegaly or masses felt. Normal bowel sounds heard. Central nervous system: Alert and oriented. No focal neurological deficits. Extremities: Symmetric 5 x 5 power. Skin: No rashes, lesions or ulcers Psychiatry: Judgement and insight appear normal. Mood & affect: Pleasant and appropriate. He is in good spirits today    The results of significant diagnostics from this hospitalization (including imaging, microbiology, ancillary and laboratory) are listed below for reference.     Microbiology: Recent Results (from the past 240 hour(s))  Culture, blood (Routine x 2)     Status: None (Preliminary result)   Collection Time: 10/08/16  5:15 PM  Result Value Ref Range Status   Specimen Description BLOOD RIGHT FOREARM  Final   Special Requests   Final    BOTTLES DRAWN AEROBIC ONLY Blood Culture adequate volume   Culture NO GROWTH 4 DAYS  Final   Report Status PENDING  Incomplete  Culture, blood (Routine  x 2)     Status: None (Preliminary result)   Collection Time: 10/08/16  7:50 PM  Result Value Ref Range Status   Specimen Description BLOOD LEFT ANTECUBITAL  Final   Special Requests   Final    BOTTLES DRAWN AEROBIC AND ANAEROBIC Blood Culture adequate volume   Culture NO GROWTH 4 DAYS  Final   Report Status PENDING  Incomplete     Labs: CBC:  Recent Labs Lab 10/08/16 1700 10/10/16 0500  WBC 7.6 10.2  HGB 14.8 13.0  HCT 43.4 39.7  MCV 90.6 90.6  PLT 163 324   Basic Metabolic Panel:  Recent Labs Lab 10/09/16 0426 10/10/16 0500 10/11/16 0945 10/11/16 1300 10/12/16 0435  NA 135 135 134* 134* 137  K 3.7 4.9 5.3* 5.2* 4.2  CL 95* 96* 94* 93* 97*  CO2 32 30 28 31 31   GLUCOSE 186* 166* 203* 163* 118*  BUN 52* 60* 63* 64* 65*  CREATININE 1.50* 1.89* 1.89* 1.84* 1.80*  CALCIUM 8.9 9.2 9.6 9.4 8.6*   BNP (last 3 results)  Recent Labs  03/01/16 1116 03/29/16 1219 05/14/16 1243  BNP 1,300.7* 1,523.5* 1,089.8*    Urinalysis    Component Value Date/Time   COLORURINE YELLOW 10/09/2016 0035   APPEARANCEUR CLEAR 10/09/2016 0035   LABSPEC 1.010 10/09/2016 0035   PHURINE 6.0 10/09/2016 0035   GLUCOSEU NEGATIVE 10/09/2016 0035   HGBUR LARGE (A) 10/09/2016 0035   BILIRUBINUR NEGATIVE 10/09/2016 0035   KETONESUR NEGATIVE 10/09/2016 0035   PROTEINUR NEGATIVE 10/09/2016 0035   UROBILINOGEN 0.2 11/20/2012 1123   NITRITE NEGATIVE 10/09/2016 0035   LEUKOCYTESUR NEGATIVE 10/09/2016 0035      Time coordinating discharge: Over 30 minutes  SIGNED:  Vernell Leep, MD, FACP, FHM. [facility] Pager [phone]  If 7PM-7AM, please contact night-coverage [email or web address] Password [email or web address] 10/12/2016, 12:50 PM

## 2016-10-12 NOTE — Discharge Instructions (Signed)

## 2016-10-12 NOTE — Progress Notes (Signed)
   Came by and spoke to patient. Feeling better. Ankles swollen but near his baseline.   OK to resume home lasix.   Will ask [facility] to recheck BMET next week at home.   He is stable for home from cardiac perspective.  Will resume torsemide at same dose prior to admission.   He is in the donut hole for Eliquis. Will see if samples available in clinic.   He has HF follow up scheduled for 11/01/16. Knows to call sooner with any issues. Family aware as well.   Legrand Como [address] [place], PA-C 10/12/2016 8:15 AM

## 2016-10-12 NOTE — Progress Notes (Signed)
SATURATION QUALIFICATIONS: (This note is used to comply with regulatory documentation for home oxygen)  Patient Saturations on Room Air at Rest = 98%  Patient Saturations on Room Air while Ambulating = 86%  Patient Saturations on 2 Liters of oxygen while Ambulating = 94%  Please briefly explain why patient needs home oxygen:

## 2016-10-12 NOTE — Progress Notes (Addendum)
1 month from eliquis samples given to patient before discharge from hospital.  Renee Pain, RN

## 2016-10-12 NOTE — Care Management Important Message (Signed)
Important Message  Patient Details  Name: Trevor Simpson MRN: [id number] Date of Birth: [birth year]   Medicare Important Message Given:  Yes    Orbie Pyo 10/12/2016, 2:39 PM

## 2016-10-12 NOTE — Progress Notes (Signed)
Pt has orders to be discharged. Discharge instructions given and pt has no additional questions at this time. Medication regimen reviewed and pt educated. Pt verbalized understanding and has no additional questions. Telemetry box removed. IV removed and site in good condition. Pt stable and waiting for transportation. 

## 2016-10-13 DIAGNOSIS — J449 Chronic obstructive pulmonary disease, unspecified: Secondary | ICD-10-CM | POA: Diagnosis not present

## 2016-10-13 DIAGNOSIS — I5033 Acute on chronic diastolic (congestive) heart failure: Secondary | ICD-10-CM | POA: Diagnosis not present

## 2016-10-13 DIAGNOSIS — I2510 Atherosclerotic heart disease of native coronary artery without angina pectoris: Secondary | ICD-10-CM | POA: Diagnosis not present

## 2016-10-13 DIAGNOSIS — N183 Chronic kidney disease, stage 3 (moderate): Secondary | ICD-10-CM | POA: Diagnosis not present

## 2016-10-13 DIAGNOSIS — K219 Gastro-esophageal reflux disease without esophagitis: Secondary | ICD-10-CM | POA: Diagnosis not present

## 2016-10-13 DIAGNOSIS — Z452 Encounter for adjustment and management of vascular access device: Secondary | ICD-10-CM | POA: Diagnosis not present

## 2016-10-13 DIAGNOSIS — R69 Illness, unspecified: Secondary | ICD-10-CM | POA: Diagnosis not present

## 2016-10-13 DIAGNOSIS — I480 Paroxysmal atrial fibrillation: Secondary | ICD-10-CM | POA: Diagnosis not present

## 2016-10-13 DIAGNOSIS — I130 Hypertensive heart and chronic kidney disease with heart failure and stage 1 through stage 4 chronic kidney disease, or unspecified chronic kidney disease: Secondary | ICD-10-CM | POA: Diagnosis not present

## 2016-10-13 DIAGNOSIS — I6529 Occlusion and stenosis of unspecified carotid artery: Secondary | ICD-10-CM | POA: Diagnosis not present

## 2016-10-13 LAB — CULTURE, BLOOD (ROUTINE X 2)
CULTURE: NO GROWTH
CULTURE: NO GROWTH
SPECIAL REQUESTS: ADEQUATE
Special Requests: ADEQUATE

## 2016-10-15 DIAGNOSIS — Z7901 Long term (current) use of anticoagulants: Secondary | ICD-10-CM | POA: Diagnosis not present

## 2016-10-15 DIAGNOSIS — Z950 Presence of cardiac pacemaker: Secondary | ICD-10-CM | POA: Diagnosis not present

## 2016-10-15 DIAGNOSIS — I130 Hypertensive heart and chronic kidney disease with heart failure and stage 1 through stage 4 chronic kidney disease, or unspecified chronic kidney disease: Secondary | ICD-10-CM | POA: Diagnosis not present

## 2016-10-15 DIAGNOSIS — K219 Gastro-esophageal reflux disease without esophagitis: Secondary | ICD-10-CM | POA: Diagnosis not present

## 2016-10-15 DIAGNOSIS — N183 Chronic kidney disease, stage 3 (moderate): Secondary | ICD-10-CM | POA: Diagnosis not present

## 2016-10-15 DIAGNOSIS — Z86718 Personal history of other venous thrombosis and embolism: Secondary | ICD-10-CM | POA: Diagnosis not present

## 2016-10-15 DIAGNOSIS — I5033 Acute on chronic diastolic (congestive) heart failure: Secondary | ICD-10-CM | POA: Diagnosis not present

## 2016-10-16 ENCOUNTER — Telehealth (HOSPITAL_COMMUNITY): Payer: Self-pay | Admitting: Cardiology

## 2016-10-16 ENCOUNTER — Ambulatory Visit (INDEPENDENT_AMBULATORY_CARE_PROVIDER_SITE_OTHER): Payer: Medicare HMO | Admitting: *Deleted

## 2016-10-16 DIAGNOSIS — I255 Ischemic cardiomyopathy: Secondary | ICD-10-CM | POA: Diagnosis not present

## 2016-10-16 NOTE — Progress Notes (Signed)
Remote ICD transmission.   

## 2016-10-16 NOTE — Telephone Encounter (Signed)
VERBAL ORDER GIVEN TO PHYSICAL THERAPY FOR HOME HEALTH AIDE 2/WEEK X 3-4 WEEKS, SPEECH THERAPY AND ADDITIONAL 3-4 WEEKS OF PT

## 2016-10-17 DIAGNOSIS — I6529 Occlusion and stenosis of unspecified carotid artery: Secondary | ICD-10-CM | POA: Diagnosis not present

## 2016-10-17 DIAGNOSIS — I5033 Acute on chronic diastolic (congestive) heart failure: Secondary | ICD-10-CM | POA: Diagnosis not present

## 2016-10-17 DIAGNOSIS — Z452 Encounter for adjustment and management of vascular access device: Secondary | ICD-10-CM | POA: Diagnosis not present

## 2016-10-17 DIAGNOSIS — I480 Paroxysmal atrial fibrillation: Secondary | ICD-10-CM | POA: Diagnosis not present

## 2016-10-17 DIAGNOSIS — I5022 Chronic systolic (congestive) heart failure: Secondary | ICD-10-CM | POA: Diagnosis not present

## 2016-10-17 DIAGNOSIS — N183 Chronic kidney disease, stage 3 (moderate): Secondary | ICD-10-CM | POA: Diagnosis not present

## 2016-10-17 DIAGNOSIS — J449 Chronic obstructive pulmonary disease, unspecified: Secondary | ICD-10-CM | POA: Diagnosis not present

## 2016-10-17 DIAGNOSIS — R69 Illness, unspecified: Secondary | ICD-10-CM | POA: Diagnosis not present

## 2016-10-17 DIAGNOSIS — I2510 Atherosclerotic heart disease of native coronary artery without angina pectoris: Secondary | ICD-10-CM | POA: Diagnosis not present

## 2016-10-17 DIAGNOSIS — J189 Pneumonia, unspecified organism: Secondary | ICD-10-CM | POA: Diagnosis not present

## 2016-10-17 DIAGNOSIS — I130 Hypertensive heart and chronic kidney disease with heart failure and stage 1 through stage 4 chronic kidney disease, or unspecified chronic kidney disease: Secondary | ICD-10-CM | POA: Diagnosis not present

## 2016-10-17 DIAGNOSIS — I5021 Acute systolic (congestive) heart failure: Secondary | ICD-10-CM | POA: Diagnosis not present

## 2016-10-17 DIAGNOSIS — K219 Gastro-esophageal reflux disease without esophagitis: Secondary | ICD-10-CM | POA: Diagnosis not present

## 2016-10-18 ENCOUNTER — Encounter: Payer: Self-pay | Admitting: Cardiology

## 2016-10-19 DIAGNOSIS — I5084 End stage heart failure: Secondary | ICD-10-CM | POA: Diagnosis not present

## 2016-10-19 DIAGNOSIS — I130 Hypertensive heart and chronic kidney disease with heart failure and stage 1 through stage 4 chronic kidney disease, or unspecified chronic kidney disease: Secondary | ICD-10-CM | POA: Diagnosis not present

## 2016-10-19 DIAGNOSIS — N183 Chronic kidney disease, stage 3 (moderate): Secondary | ICD-10-CM | POA: Diagnosis not present

## 2016-10-19 DIAGNOSIS — J189 Pneumonia, unspecified organism: Secondary | ICD-10-CM | POA: Diagnosis not present

## 2016-10-21 DIAGNOSIS — J449 Chronic obstructive pulmonary disease, unspecified: Secondary | ICD-10-CM | POA: Diagnosis not present

## 2016-10-21 DIAGNOSIS — I5022 Chronic systolic (congestive) heart failure: Secondary | ICD-10-CM | POA: Diagnosis not present

## 2016-10-22 ENCOUNTER — Telehealth (HOSPITAL_COMMUNITY): Payer: Self-pay

## 2016-10-22 ENCOUNTER — Telehealth (HOSPITAL_COMMUNITY): Payer: Self-pay | Admitting: *Deleted

## 2016-10-22 NOTE — Telephone Encounter (Signed)
[name], Speech therapist with [facility] called to let us know that patient is refusing their services along with PT at this time.

## 2016-10-22 NOTE — Telephone Encounter (Signed)
Patient requesting palliative care.  Social work with [facility] accepted VO to proceed with referral process. Will make Dr. Haroldine Laws aware.  Renee Pain, RN

## 2016-10-24 DIAGNOSIS — Z86718 Personal history of other venous thrombosis and embolism: Secondary | ICD-10-CM | POA: Diagnosis not present

## 2016-10-24 DIAGNOSIS — J189 Pneumonia, unspecified organism: Secondary | ICD-10-CM | POA: Diagnosis not present

## 2016-10-24 DIAGNOSIS — Z950 Presence of cardiac pacemaker: Secondary | ICD-10-CM | POA: Diagnosis not present

## 2016-10-24 DIAGNOSIS — I5033 Acute on chronic diastolic (congestive) heart failure: Secondary | ICD-10-CM | POA: Diagnosis not present

## 2016-10-24 DIAGNOSIS — I5022 Chronic systolic (congestive) heart failure: Secondary | ICD-10-CM | POA: Diagnosis not present

## 2016-10-24 DIAGNOSIS — Z7901 Long term (current) use of anticoagulants: Secondary | ICD-10-CM | POA: Diagnosis not present

## 2016-10-24 DIAGNOSIS — J449 Chronic obstructive pulmonary disease, unspecified: Secondary | ICD-10-CM | POA: Diagnosis not present

## 2016-10-24 DIAGNOSIS — I130 Hypertensive heart and chronic kidney disease with heart failure and stage 1 through stage 4 chronic kidney disease, or unspecified chronic kidney disease: Secondary | ICD-10-CM | POA: Diagnosis not present

## 2016-10-24 DIAGNOSIS — K219 Gastro-esophageal reflux disease without esophagitis: Secondary | ICD-10-CM | POA: Diagnosis not present

## 2016-10-24 DIAGNOSIS — N183 Chronic kidney disease, stage 3 (moderate): Secondary | ICD-10-CM | POA: Diagnosis not present

## 2016-10-25 ENCOUNTER — Telehealth (HOSPITAL_COMMUNITY): Payer: Self-pay

## 2016-10-25 DIAGNOSIS — I5033 Acute on chronic diastolic (congestive) heart failure: Secondary | ICD-10-CM | POA: Diagnosis not present

## 2016-10-25 DIAGNOSIS — I5021 Acute systolic (congestive) heart failure: Secondary | ICD-10-CM | POA: Diagnosis not present

## 2016-10-25 NOTE — Telephone Encounter (Signed)
Received call from [facility] RN requesting VO for acute wound care on new developing pressure sites to back and buttock with foam dressing weekly and prn. VO given per Dr. Haroldine Laws.  Renee Pain, RN

## 2016-10-26 ENCOUNTER — Other Ambulatory Visit (HOSPITAL_COMMUNITY): Payer: Self-pay | Admitting: Internal Medicine

## 2016-10-30 DIAGNOSIS — R531 Weakness: Secondary | ICD-10-CM | POA: Diagnosis not present

## 2016-10-31 ENCOUNTER — Telehealth (HOSPITAL_COMMUNITY): Payer: Self-pay | Admitting: *Deleted

## 2016-10-31 DIAGNOSIS — N183 Chronic kidney disease, stage 3 (moderate): Secondary | ICD-10-CM | POA: Diagnosis not present

## 2016-10-31 DIAGNOSIS — K219 Gastro-esophageal reflux disease without esophagitis: Secondary | ICD-10-CM | POA: Diagnosis not present

## 2016-10-31 DIAGNOSIS — J449 Chronic obstructive pulmonary disease, unspecified: Secondary | ICD-10-CM | POA: Diagnosis not present

## 2016-10-31 DIAGNOSIS — I5033 Acute on chronic diastolic (congestive) heart failure: Secondary | ICD-10-CM | POA: Diagnosis not present

## 2016-10-31 DIAGNOSIS — Z86718 Personal history of other venous thrombosis and embolism: Secondary | ICD-10-CM | POA: Diagnosis not present

## 2016-10-31 DIAGNOSIS — I5022 Chronic systolic (congestive) heart failure: Secondary | ICD-10-CM | POA: Diagnosis not present

## 2016-10-31 DIAGNOSIS — Z950 Presence of cardiac pacemaker: Secondary | ICD-10-CM | POA: Diagnosis not present

## 2016-10-31 DIAGNOSIS — Z7901 Long term (current) use of anticoagulants: Secondary | ICD-10-CM | POA: Diagnosis not present

## 2016-10-31 DIAGNOSIS — J189 Pneumonia, unspecified organism: Secondary | ICD-10-CM | POA: Diagnosis not present

## 2016-10-31 DIAGNOSIS — I5021 Acute systolic (congestive) heart failure: Secondary | ICD-10-CM | POA: Diagnosis not present

## 2016-10-31 DIAGNOSIS — I130 Hypertensive heart and chronic kidney disease with heart failure and stage 1 through stage 4 chronic kidney disease, or unspecified chronic kidney disease: Secondary | ICD-10-CM | POA: Diagnosis not present

## 2016-10-31 NOTE — Telephone Encounter (Signed)
Palliative care called to inform of visit with patient and family. Patient did not want to discuss end of life decisions. Patient has an appt tomorrow with Dr.Bensimhon and they would like him to discuss end of life with patient. Message verbally delivered to  Isabela.

## 2016-11-01 ENCOUNTER — Encounter (HOSPITAL_COMMUNITY): Payer: Self-pay | Admitting: Internal Medicine

## 2016-11-01 ENCOUNTER — Ambulatory Visit (HOSPITAL_COMMUNITY)
Admission: RE | Admit: 2016-11-01 | Discharge: 2016-11-01 | Disposition: A | Discharge status: Home / Self Care | Payer: Medicare HMO | Source: Ambulatory Visit | Attending: Internal Medicine | Admitting: Internal Medicine

## 2016-11-01 VITALS — BP 121/61 | HR 78

## 2016-11-01 DIAGNOSIS — N183 Chronic kidney disease, stage 3 (moderate): Secondary | ICD-10-CM | POA: Insufficient documentation

## 2016-11-01 DIAGNOSIS — I5022 Chronic systolic (congestive) heart failure: Secondary | ICD-10-CM | POA: Diagnosis not present

## 2016-11-01 DIAGNOSIS — Y95 Nosocomial condition: Secondary | ICD-10-CM | POA: Insufficient documentation

## 2016-11-01 DIAGNOSIS — J449 Chronic obstructive pulmonary disease, unspecified: Secondary | ICD-10-CM | POA: Diagnosis not present

## 2016-11-01 DIAGNOSIS — I480 Paroxysmal atrial fibrillation: Secondary | ICD-10-CM | POA: Insufficient documentation

## 2016-11-01 DIAGNOSIS — I255 Ischemic cardiomyopathy: Secondary | ICD-10-CM | POA: Diagnosis not present

## 2016-11-01 DIAGNOSIS — I2510 Atherosclerotic heart disease of native coronary artery without angina pectoris: Secondary | ICD-10-CM | POA: Diagnosis not present

## 2016-11-01 DIAGNOSIS — Z8249 Family history of ischemic heart disease and other diseases of the circulatory system: Secondary | ICD-10-CM | POA: Diagnosis not present

## 2016-11-01 DIAGNOSIS — Z8673 Personal history of transient ischemic attack (TIA), and cerebral infarction without residual deficits: Secondary | ICD-10-CM | POA: Insufficient documentation

## 2016-11-01 DIAGNOSIS — R001 Bradycardia, unspecified: Secondary | ICD-10-CM | POA: Insufficient documentation

## 2016-11-01 DIAGNOSIS — I959 Hypotension, unspecified: Secondary | ICD-10-CM | POA: Diagnosis not present

## 2016-11-01 DIAGNOSIS — I739 Peripheral vascular disease, unspecified: Secondary | ICD-10-CM | POA: Insufficient documentation

## 2016-11-01 DIAGNOSIS — I130 Hypertensive heart and chronic kidney disease with heart failure and stage 1 through stage 4 chronic kidney disease, or unspecified chronic kidney disease: Secondary | ICD-10-CM | POA: Diagnosis not present

## 2016-11-01 NOTE — Patient Instructions (Signed)
Please call [facility] @ [phone]

## 2016-11-01 NOTE — Progress Notes (Signed)
Patient ID: Trevor Simpson, male   DOB: 01-11-1934, 83 y.o.   MRN: [id number]    Advanced Heart Failure Clinic Note   PCP: Dr Maudie Mercury Pulmonology: Dr Chase Caller Cardiology: Dr Lovena Le  HPI: Trevor Simpson is a 83 y.o. year old with history of  COPD, symptomatic bradycardia, PAF, HTN, PAD, CVA, chronic systolic heart failure and ischemic cardiomyopathy (EF 20-25% echo 7/16)  s/p CRT-D on 09/26/13  Admitted in 6/15 with low-output. RHC as below. Started on milrinone. In July 2015 underwent CRT-D upgrade Mean RA 11 PA 53/24  Mean PCWP 22 CI 1.5  Echo (6/15): EF 15%, moderate central Trevor, mild to moderately decreased RV systolic function.   Milrinone previously stopped in September 2015 due to patient preference, despite borderline co-ox.  Admitted 12/1 -> 02/21/16 with CP and profound fatigue. RHC with depressed cardiac output. Started on milrinone. Titrated up to 0.375 mcg with continued low mixed venous sat.  Improved with diureses and milrinone. Coox 67% on discharge. Continue milrinone for home.   Admitted 10/08/16-10/12/16 with HCAP. Volume status was stable throughout admission. He completed 3 days of IV cefepime and Vancomycin. Then transitioned to po Augmentin. Discharge weight was 149 pounds.   Trevor Simpson presents today for follow up. Per chart, patient has been seen by Palliative Care in the home and did not wish to have end of life discussions, he preferred to talk to Dr. Haroldine Laws instead. His family is present today at the visit, Trevor Simpson is pale, ill appearing. Says he feels like he is suffocating. For the past two nights he hasn't slept well due to not being able to breathe well.   [facility]vue: Thoracic impedence above threshold. No VT/VF. AT/AF burden 1.7%  Review of systems complete and found to be negative unless listed in HPI.     [facility] 12/1: RA = 11 RV = 56/14 PA = 55/26 (38) PCW = 27 mean V wave to 40 Fick cardiac output/index = 2.4/1.3 PVR = 4.5 WU Ao sat = 99% PA sat = 41%,  43%  LHC 08/2012  Left mainstem: The left main is long with 20% stenosis distally.  Left anterior descending (LAD): The left anterior descending artery is moderately calcified in the proximal vessel. There is diffuse disease in the proximal vessel up to 30%. The first diagonal is relatively small and has mild disease proximally. The ramus intermediate branch is a large branch that bifurcates in the mid vessel. The proximal vessel has a 90-95% stenosis.  Left circumflex (LCx): The left circumflex is relatively small. It gives rise to a single marginal branch and then continues in the AV groove. The first marginal branch has diffuse 60-70% stenosis proximally.  Right coronary artery (RCA): The right coronary is occluded proximally. There are right to right and left to right collaterals. Severe two-vessel obstructive coronary disease. Chronic total occlusion of the right coronary Successful stenting of the ramus intermediate branch with a bare-metal stent  Labs:  7/14 AST 27 ALT 21  11/14 K 3.8 Creatinine  1.41  12/14 Pro BNP 1100 4/14: K 3.9, creatinine 1.4, BUN 22 6/15: K 4.1, creatinine 1.2 7/15: K 3.7, creatinine 1.36, HCT 43.7, co-ox 66.5% 8/15: digoxin 1.2, co-ox 55%, K 4.0, creatinine 1.36 9/15: co-ox 56.8% 11/15 K 3.8, creatinine 1.25 10/17/14: K 3.3 creatinine 1.34   SH: Lives at home with wife  FH: Father, Brother - CAD HTN, DM        Sister - lung cancer     Current Outpatient  Prescriptions  Medication Sig Dispense Refill  . ALPRAZolam (XANAX) 0.25 MG tablet Take 0.125-0.375 mg by mouth See admin instructions. Take 0.125 mg by mouth every morning, lunch and dinner Take 0.375 mg at bedtime.    Marland Kitchen amiodarone (PACERONE) 200 MG tablet Take 1 tablet (200 mg total) by mouth daily. 90 tablet 3  . apixaban (ELIQUIS) 2.5 MG TABS tablet Take 1 tablet (2.5 mg total) by mouth 2 (two) times daily. 60 tablet 3  . atorvastatin (LIPITOR) 20 MG tablet Take 20 mg by mouth daily at 6 PM.     . B  Complex-C (SUPER B COMPLEX) TABS Take 1 tablet by mouth daily.     . calcium carbonate (OS-CAL) 600 MG TABS Take 600 mg by mouth daily with breakfast.     . cetirizine (ZYRTEC) 10 MG tablet Take 10 mg by mouth daily.     . digoxin (LANOXIN) 0.125 MG tablet Take 0.5 tablets (0.0625 mg total) by mouth daily. 90 tablet 3  . doxylamine, Sleep, (UNISOM) 25 MG tablet Take 25 mg by mouth at bedtime.    . fluticasone (FLONASE) 50 MCG/ACT nasal spray Place 1 spray into both nostrils daily as needed for allergies. Reported on 04/11/2015    . furosemide (LASIX) 80 MG tablet Take 80 mg by mouth.    . levothyroxine (SYNTHROID, LEVOTHROID) 112 MCG tablet Take 112 mcg by mouth See admin instructions. Pt takes Monday- Saturday - skips on Sundays    . magnesium gluconate (MAGONATE) 500 MG tablet Take 500 mg by mouth daily.     . milrinone (PRIMACOR) 20 MG/100 ML SOLN infusion Inject 26.775 mcg/min into the vein continuous.    . Multiple Vitamin (MULTIVITAMIN WITH MINERALS) TABS Take 1 tablet by mouth daily.    Marland Kitchen omeprazole (PRILOSEC) 20 MG capsule Take 20 mg by mouth daily as needed (indigestion).     . potassium chloride SA (KLOR-CON M20) 20 MEQ tablet Take 1 tablet (20 mEq total) by mouth 2 (two) times daily.    . Saw Palmetto, Serenoa repens, (SAW PALMETTO PO) Take 1 capsule by mouth 2 (two) times daily.    Marland Kitchen senna (SENOKOT) 8.6 MG TABS tablet Take 1 tablet by mouth daily as needed for mild constipation.    Marland Kitchen umeclidinium-vilanterol (ANORO ELLIPTA) 62.5-25 MCG/INH AEPB Inhale 1 puff into the lungs daily. 7 each 0  . vitamin C (ASCORBIC ACID) 500 MG tablet Take 500 mg by mouth daily.      . nitroGLYCERIN (NITROSTAT) 0.4 MG SL tablet Place 1 tablet (0.4 mg total) under the tongue every 5 (five) minutes as needed for chest pain. (Patient not taking: Reported on 11/01/2016) 25 tablet 3   No current facility-administered medications for this encounter.      Allergies  Allergen Reactions  . Codeine     "tripped  out on it"  . Morphine     "tripped out on it"    Vitals:   11/01/16 1442  BP: 121/61  Pulse: 78   Wt Readings from Last 3 Encounters:  10/12/16 149 lb 3.2 oz (67.7 kg)  09/18/16 148 lb 9.6 oz (67.4 kg)  09/13/16 145 lb 6.4 oz (66 kg)     PHYSICAL EXAM: General: Elderly, ill appearing male. In wheelchair barely able to lift his head HEENT: Thin, temporal wasting.  Neck: Supple, JVD 9-10 cm.  2+ bilat; no bruits. No thyromegaly or nodule noted. Cor: PMI nondisplaced. RRR, 2/6 TR murmur, 2/6 HSM at apex. RIJ tunneled PICC +s3  Lungs: Diminished throughout.  Abdomen: Soft, non-tender, non-distended, no HSM. No bruits or masses. + bowel sounds.  Extremities: No cyanosis, clubbing, rash. 1+ ankle edema bilaterally.  Neuro: Alert & orientedx3, cranial nerves grossly intact. moves all 4 extremities w/o difficulty. Affect pleasant   ASSESSMENT & PLAN: 1. Acute on chronic Systolic/end-stage Heart Failure: Ischemic cardiomyopathy s/p CRT-D; Echo 6/15 with EF 15%, moderate Trevor, mild to moderately decreased RV systolic function. Echo 7/16 EF 20-25% mild Trevor/AI. RV mild dysfunction  RHC in 6/15 showed CI 1.5 so patient was started on milrinone.  Milrinone stopped in 11/2013.  Improved after CRT optimization. Milrinone resumed 12/17 - End stage, appears to be actively dying.  - Talked with family about full comfort care. This would mean turning off milrinone. They want to speak to Dr. Haroldine Laws but understand that Trevor Simpson is suffering.   2. PAF: S/P DC-CV 09/2012.   - Rate controlled. Will stop all medications that do not provide comfort.   3. CAD: Extensive disease burden by cath 08/2012. - Denies chest pain.    4. CKD stage III - As above, will transition for full comfort.   5. Hypotension - Stable.   Arbutus Leas, NP  2:51 PM  Patient seen and examined with Jettie Booze, NP. We discussed all aspects of the encounter. I agree with the assessment and plan as stated above.   He is  actively dying from advanced HF despite inotropic support. Long talk with him and his family about options and they would like to have him home with Hospice. I spoke with [facility] personally and they will meet him at his home tonight. They will give IV/IM lasix for comfort and start fentanyl and versed or ativan as needed. Stop milrinone. I expect he will pass in the next few days.   Total time spent 45 minutes. Over half that time spent discussing above.   Glori Bickers, MD  4:16 PM

## 2016-11-03 ENCOUNTER — Telehealth: Payer: Self-pay | Admitting: Cardiovascular Disease

## 2016-11-03 NOTE — Telephone Encounter (Signed)
Called by daughter. Patient is now on home hospice. Last seen by [facility] on Thursday. Family decided to deactivate ICD. They call me to help facilitate. She asked the hospice nurse if she would be able to come out tomorrow to do it. Appears that hospice nurse has the ability to do that. I placed a nursing text order in to deactivate tachy therapies. If nurse for what ever reason cant do it I asked the family to come back to office on Monday where we can de activate without issues.   Cristina Gong, MD

## 2016-11-05 ENCOUNTER — Telehealth (HOSPITAL_COMMUNITY): Payer: Self-pay | Admitting: *Deleted

## 2016-11-05 NOTE — Telephone Encounter (Signed)
Received a call from [facility] asking if patient's milrinone needed to be discontinued.  I told her that per Dr. Clayborne Dana orders it should have been discontinued last Thursday, that orders were sent to [facility] to discontinue that evening.   I called and spoke with Candy the nursing supervisor there to clarify orders that were faxed over last Thursday that were as follows:  Stop milrinone Give 80 IV lasix once then as directed 25 mcg fentanyl patch.  Candy asked if she could call the home health nurses that were out to see the patient over the weekend to find out more information.  She called back and was very apologetic saying she was unsure of what happened but it was reported that the [place] had already been disconnected but this was not the case.  Also they were unable to find a local pharmacy that carried the IV lasix so 80 mg of PO lasix is what was given to the patient.  Family was instructed to pick up fentanyl patches from local pharmacy but they were never picked up.  I called and spoke directly to [facility] the home health nurse that was at the patient's house today and she verified that the milirinone was in fact still running.  I advised her to disconnect and lock PICC line per Dr. Clayborne Dana original orders last Thursday.  She verbalized she would and she also stated she will be sure the family goes and picks up the fentanyl patches and she will apply before she leaves the patient today. Trevor Simpson is also deactivating ICD today. I called Candy back and told her that I had spoken with Trevor Simpson and what all was communicated.  Again she was very apologetic and will call us if there are any other problems. This message will be routed to Dr. Haroldine Laws so that he is aware.

## 2016-11-05 NOTE — Telephone Encounter (Signed)
Family called over the weekend requesting for home hospice to deactivate ICD.  I called [facility] to give verbal orders to deactivate ICD per Dr. Haroldine Laws. RN will go see patient today.

## 2016-11-06 ENCOUNTER — Telehealth (HOSPITAL_COMMUNITY): Payer: Self-pay

## 2016-11-06 NOTE — Telephone Encounter (Signed)
HHRN with [facility] called to report patient more SOB, however, states patient's family gave him a netty pot last night and thinks he aspirated it. HHRN calling to see if they can give him morphine prn for comfort/breathing. States it is already in the conform pack in the patient's home but we have it listed as an allergy for AMS in his chart.  HHRN reports patient's family already gave patient morphine last night for comfort and SOB and reports that it helped. Advised per Oda Kilts PA-C to clarify with hospice MD.  Renee Pain, RN

## 2016-11-07 ENCOUNTER — Other Ambulatory Visit: Payer: Self-pay | Admitting: Internal Medicine

## 2016-11-11 ENCOUNTER — Other Ambulatory Visit (HOSPITAL_COMMUNITY): Payer: Self-pay | Admitting: Internal Medicine

## 2016-11-13 ENCOUNTER — Other Ambulatory Visit (HOSPITAL_COMMUNITY): Payer: Self-pay | Admitting: Internal Medicine

## 2016-11-13 ENCOUNTER — Ambulatory Visit: Payer: Medicare HMO | Admitting: Podiatry

## 2016-11-14 ENCOUNTER — Encounter (HOSPITAL_COMMUNITY): Payer: Self-pay

## 2016-11-14 NOTE — Progress Notes (Signed)
Certificate of death completed and signed by Dr. Haroldine Laws. Copy placed in patient's electronic medical record and original mailed to [employer] in enclosed envelope.  Renee Pain, RN

## 2016-11-17 DEATH — deceased

## 2016-11-20 LAB — CUP PACEART REMOTE DEVICE CHECK
Battery Remaining Longevity: 19 mo
Battery Remaining Percentage: 39 %
Battery Voltage: 2.89 V
Brady Statistic AP VP Percent: 91 %
Brady Statistic AS VP Percent: 6.9 %
Date Time Interrogation Session: 20180731093534
HighPow Impedance: 59 Ohm
HighPow Impedance: 59 Ohm
Implantable Lead Implant Date: 20110516
Implantable Lead Implant Date: 20150709
Implantable Lead Implant Date: 20150709
Implantable Lead Location: 753858
Lead Channel Impedance Value: 390 Ohm
Lead Channel Impedance Value: 580 Ohm
Lead Channel Pacing Threshold Amplitude: 0.75 V
Lead Channel Pacing Threshold Pulse Width: 0.5 ms
Lead Channel Pacing Threshold Pulse Width: 0.5 ms
Lead Channel Sensing Intrinsic Amplitude: 12 mV
Lead Channel Sensing Intrinsic Amplitude: 3 mV
Lead Channel Setting Pacing Amplitude: 2 V
Lead Channel Setting Pacing Amplitude: 2.5 V
Lead Channel Setting Pacing Amplitude: 5 V
Lead Channel Setting Sensing Sensitivity: 0.5 mV
MDC IDC LEAD LOCATION: 753859
MDC IDC LEAD LOCATION: 753860
MDC IDC MSMT LEADCHNL LV PACING THRESHOLD AMPLITUDE: 1.75 V
MDC IDC MSMT LEADCHNL RA IMPEDANCE VALUE: 350 Ohm
MDC IDC MSMT LEADCHNL RA PACING THRESHOLD AMPLITUDE: 1.125 V
MDC IDC MSMT LEADCHNL RA PACING THRESHOLD PULSEWIDTH: 1.2 ms
MDC IDC PG IMPLANT DT: 20150709
MDC IDC PG SERIAL: 7201810
MDC IDC SET LEADCHNL LV PACING PULSEWIDTH: 0.5 ms
MDC IDC SET LEADCHNL RV PACING PULSEWIDTH: 0.5 ms
MDC IDC STAT BRADY AP VS PERCENT: 1.7 %
MDC IDC STAT BRADY AS VS PERCENT: 1 %
MDC IDC STAT BRADY RA PERCENT PACED: 90 %

## 2017-02-22 IMAGING — DX DG FOOT COMPLETE 3+V*R*
3 series · 3 of 3 positions shown · non-contrast
Comparison: None.

CLINICAL DATA: Right foot pain for the past 2-3 years. Clinical
concern for infection.

EXAM:
RIGHT FOOT COMPLETE - 3+ VIEW

[foot ap]
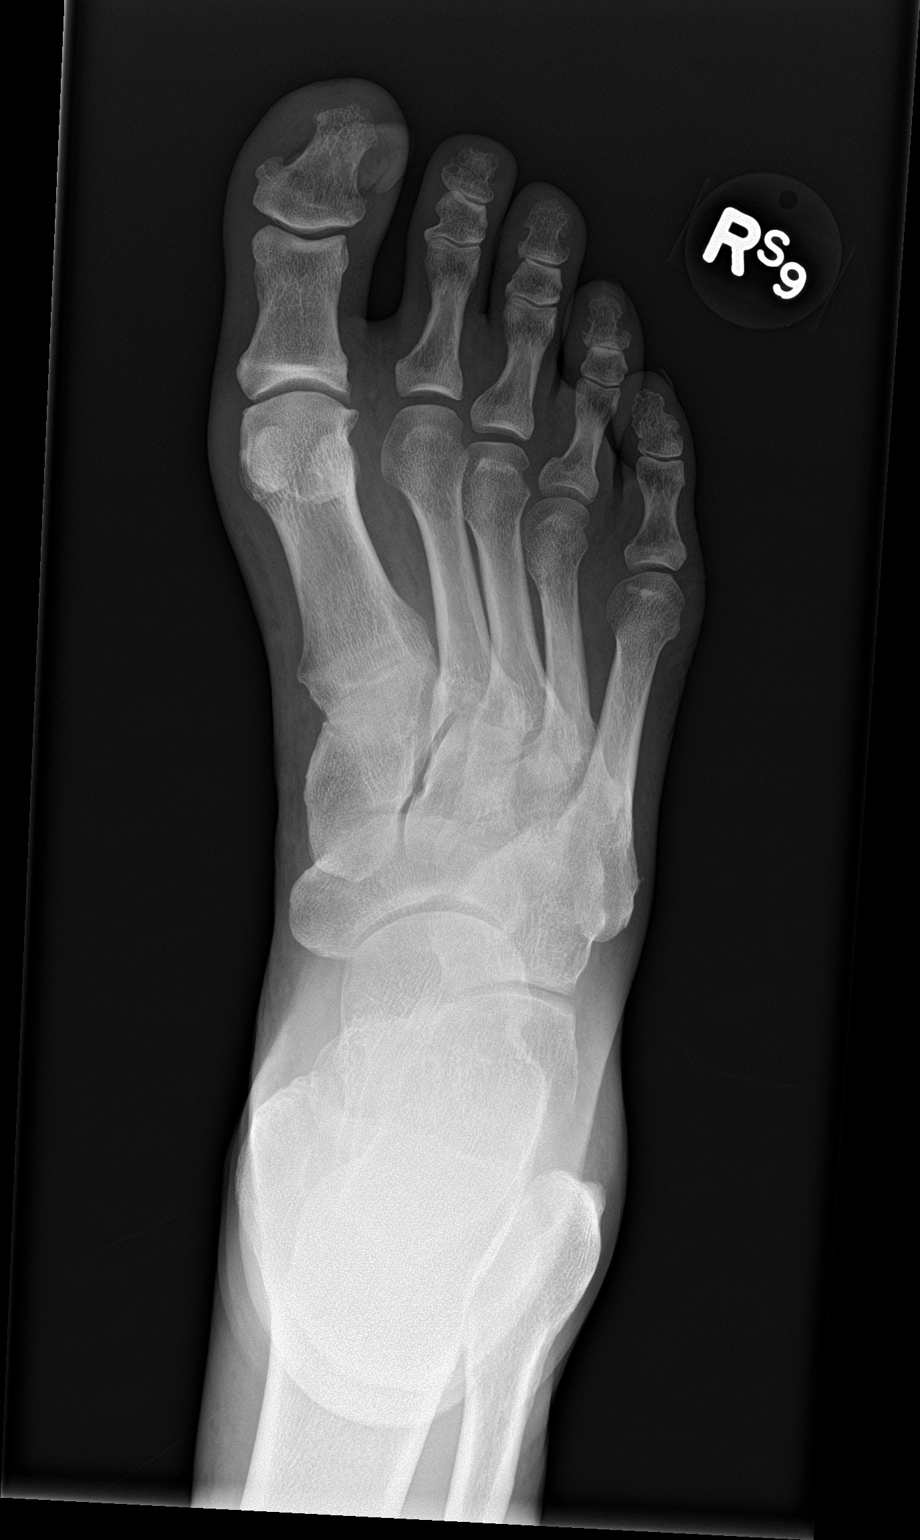

[foot obl]
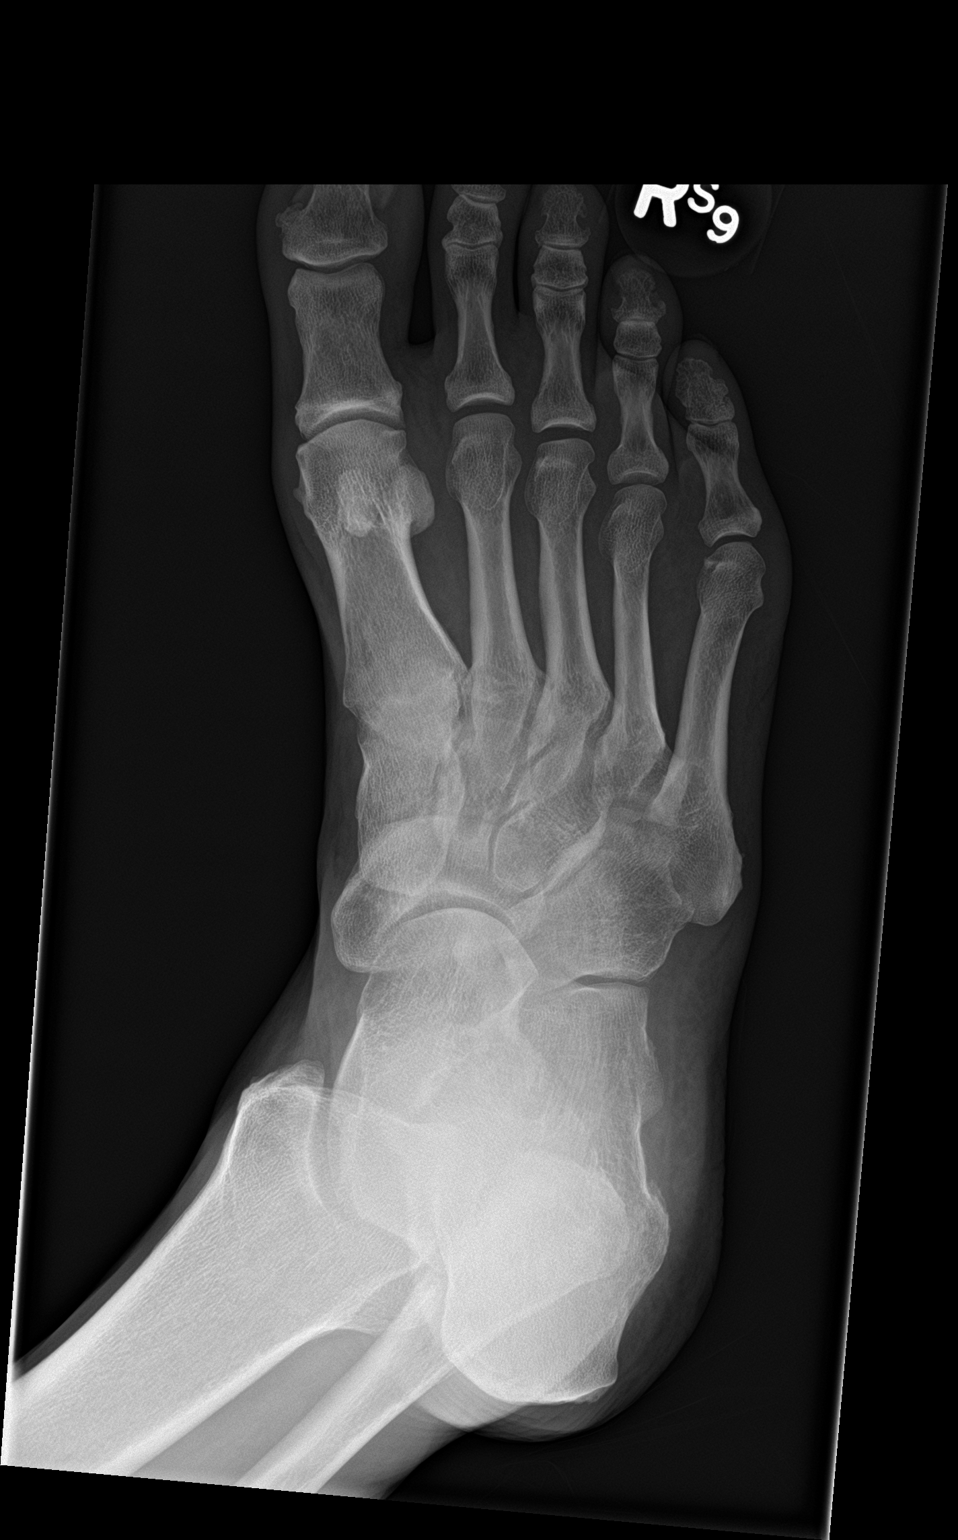

[foot lat]
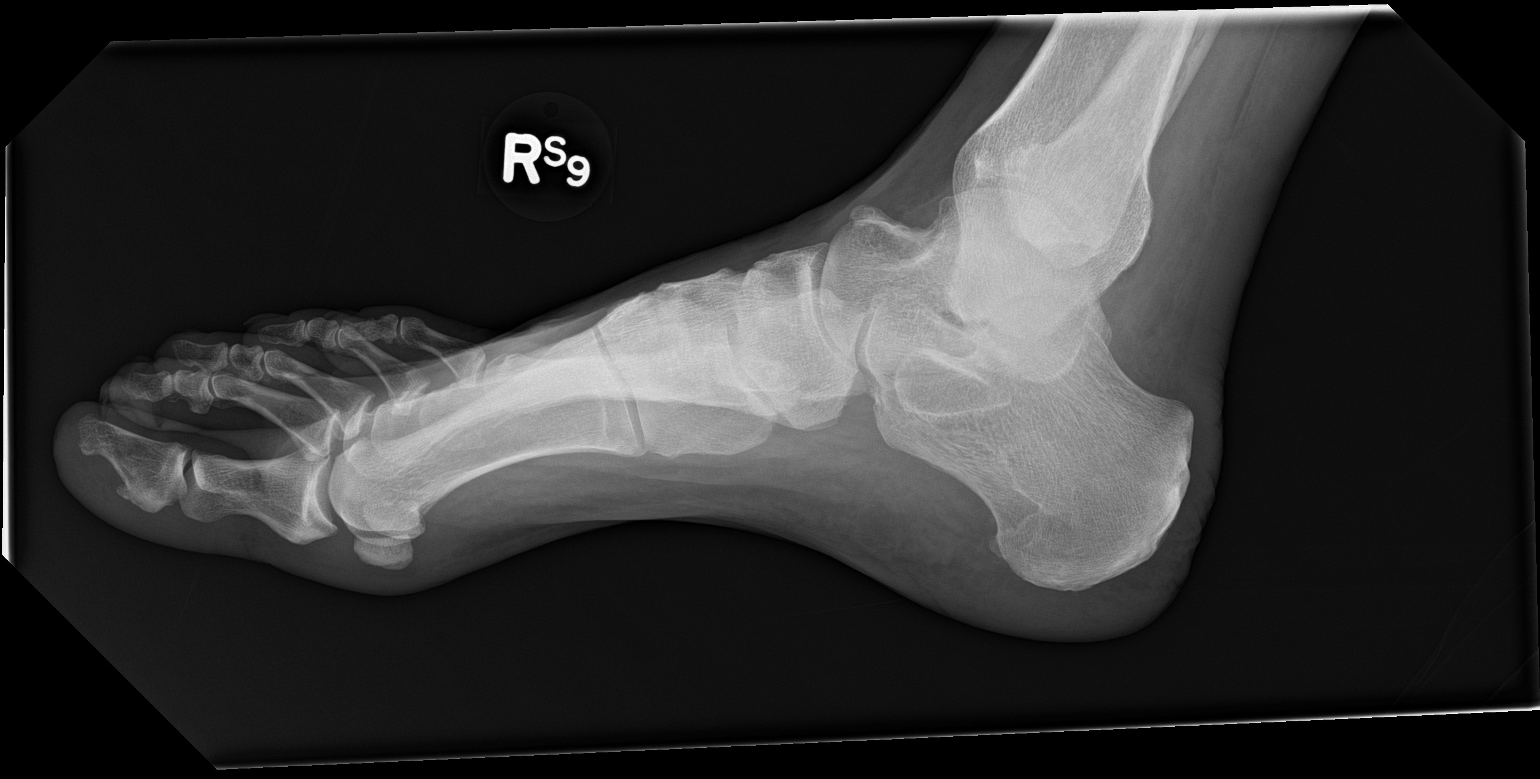

[3 of 3 positions shown; findings below may reference images not displayed]

FINDINGS: Mild to moderate spur formation at the first MTP joint. Small fifth
metatarsal head spur. Dorsal navicular spur formation and small
inferior calcaneal spur. No soft tissue swelling, soft tissue gas,
bone destruction or periosteal reaction.
IMPRESSION: Multifocal spur formation. No radiographic evidence of infection or
osteomyelitis.

## 2017-05-27 IMAGING — DX DG CHEST 2V
3 series · 3 of 3 positions shown · non-contrast
Comparison: Chest x-ray of February 17, 2016

CLINICAL DATA: Chronic dyspnea with increasing symptoms. History of
CHF, CABG, previous MI, peripheral vascular disease, former smoker.

EXAM:
CHEST  2 VIEW

[chest pa (1 of 2)]
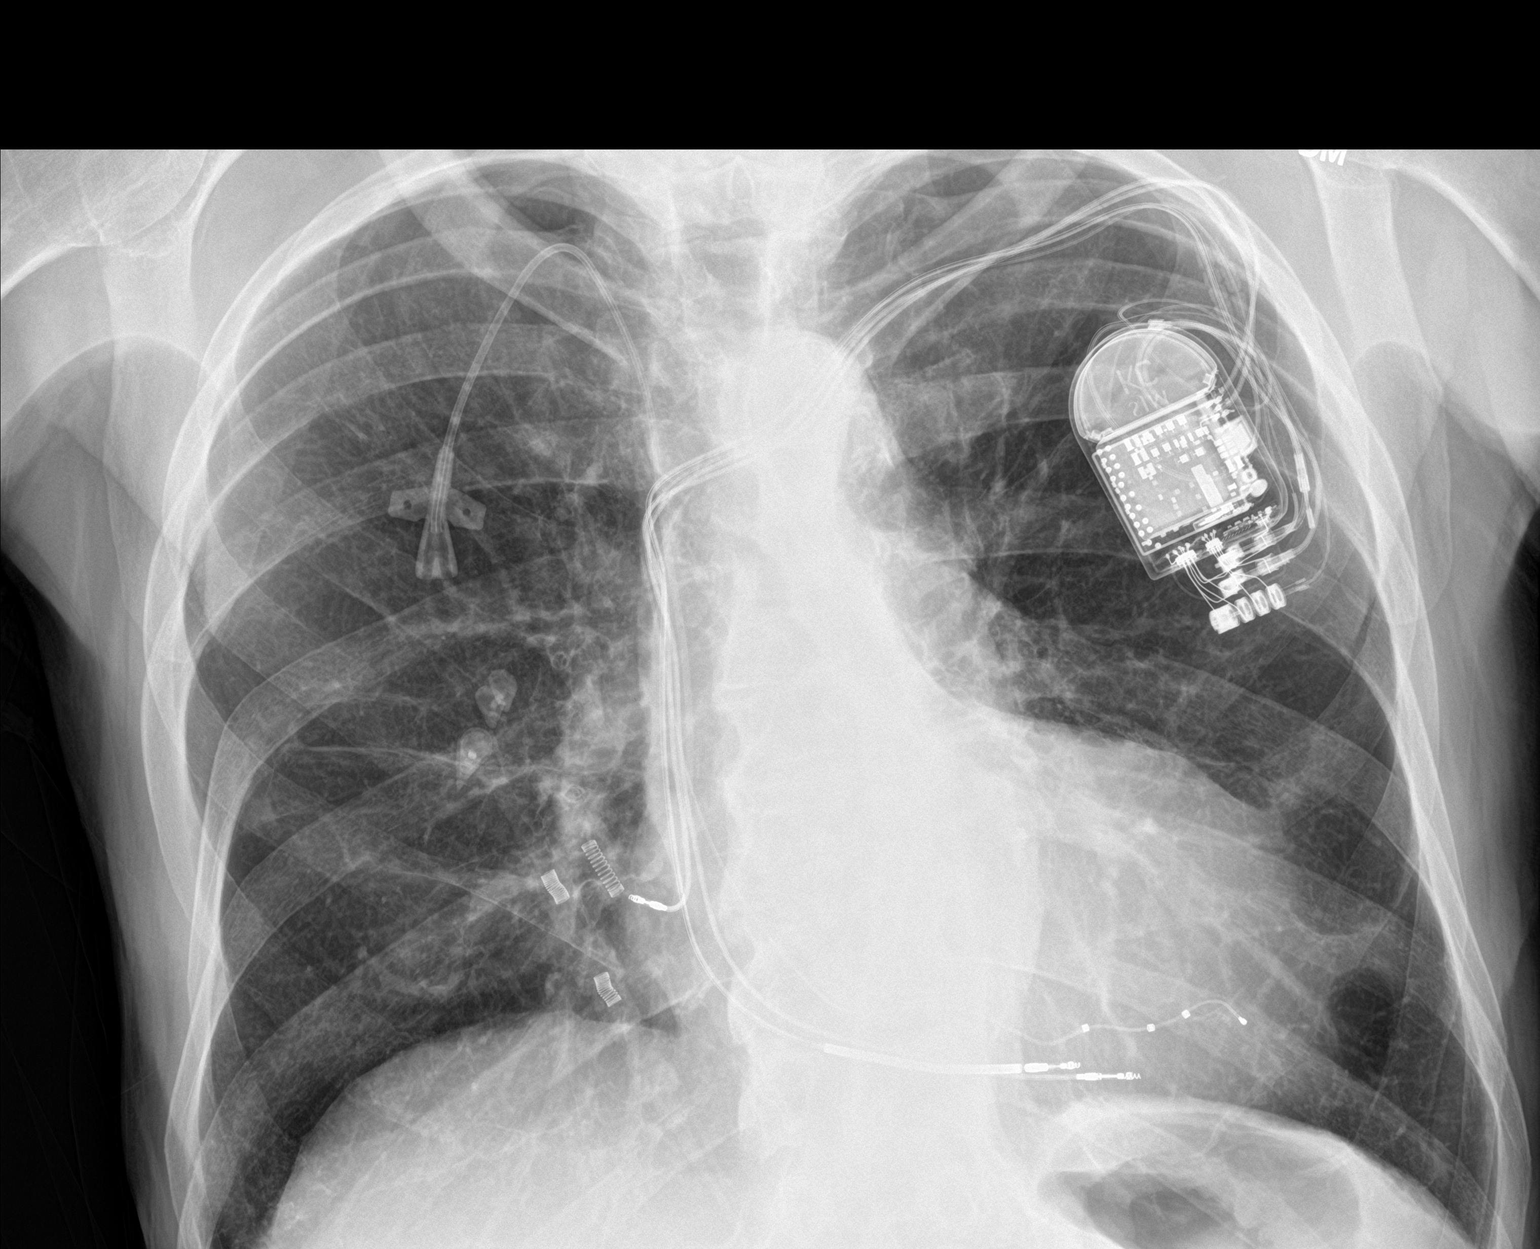

[chest lat]
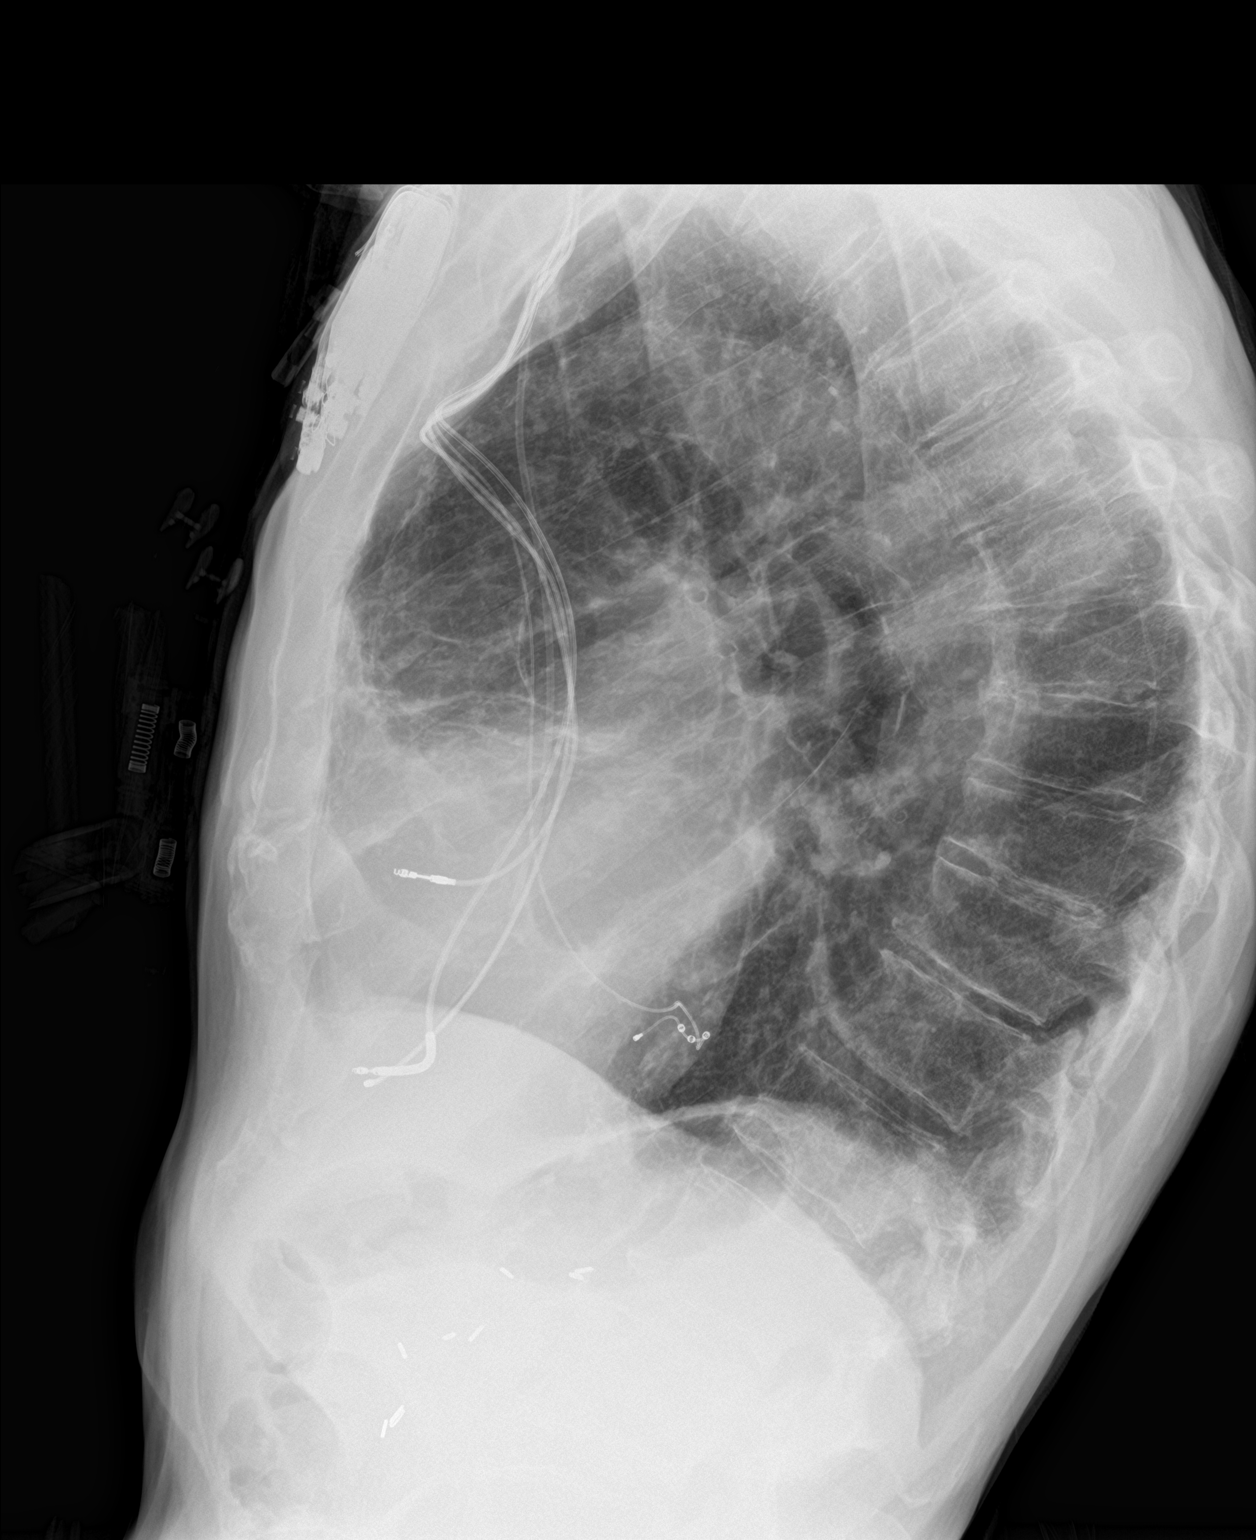

[chest pa (2 of 2)]
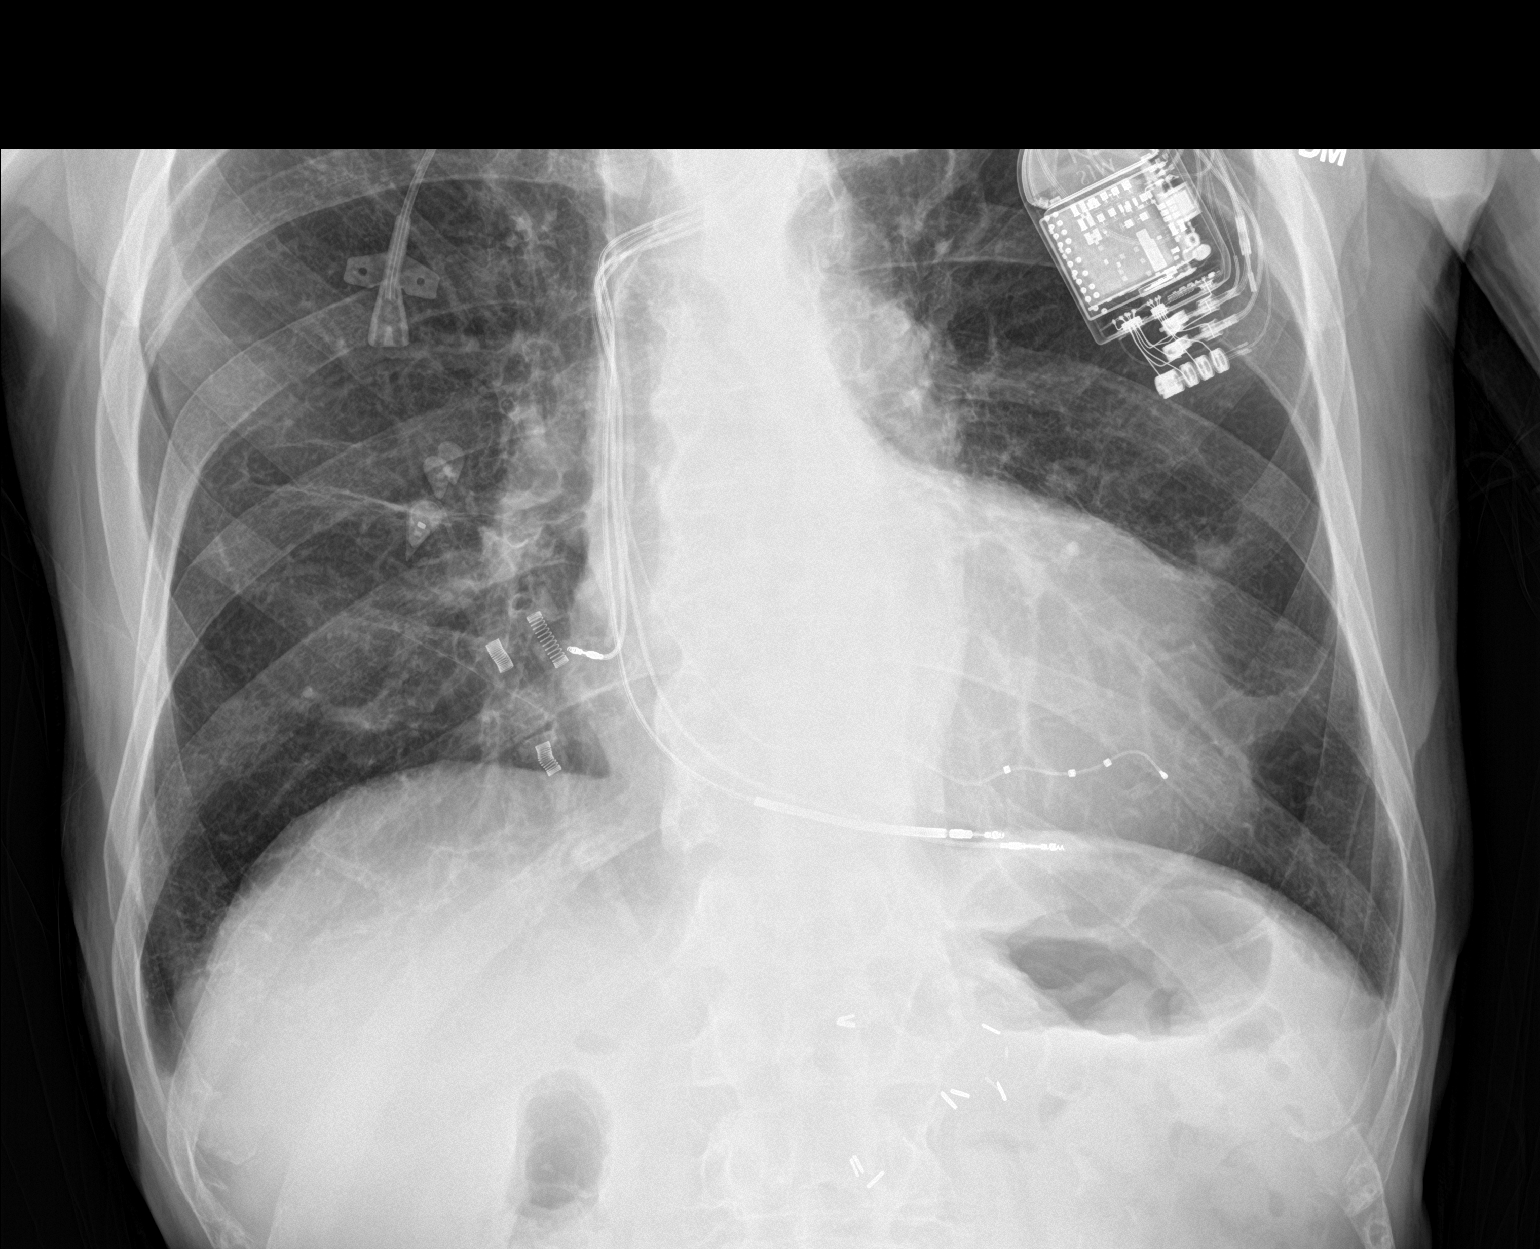

[3 of 3 positions shown; findings below may reference images not displayed]

FINDINGS: The lungs remain hyperinflated and clear. The heart is mildly
enlarged. The pulmonary vascularity is normal. There is mural
calcification within the thoracic aorta. The mediastinum is normal
in width. The ICD is in stable position. The right internal jugular
venous catheter tip projects over the distal third of the SVC.
IMPRESSION: COPD without evidence of pneumonia. Cardiomegaly without pulmonary
vascular congestion.

Thoracic aortic atherosclerosis.

## 2017-07-24 ENCOUNTER — Encounter (HOSPITAL_COMMUNITY): Payer: Medicare HMO

## 2017-07-24 ENCOUNTER — Ambulatory Visit: Payer: Medicare HMO | Admitting: Family
